# Patient Record
Sex: Male | Born: 1940
Health system: Southern US, Community
[De-identification: ages and names within clinical notes are randomized; demographics above are authoritative.]

## PROBLEM LIST (undated history)

## (undated) DIAGNOSIS — R51 Headache: Secondary | ICD-10-CM

## (undated) DIAGNOSIS — R350 Frequency of micturition: Secondary | ICD-10-CM

## (undated) DIAGNOSIS — I6529 Occlusion and stenosis of unspecified carotid artery: Secondary | ICD-10-CM

## (undated) DIAGNOSIS — R112 Nausea with vomiting, unspecified: Secondary | ICD-10-CM

## (undated) DIAGNOSIS — Z9289 Personal history of other medical treatment: Secondary | ICD-10-CM

## (undated) DIAGNOSIS — I739 Peripheral vascular disease, unspecified: Secondary | ICD-10-CM

## (undated) DIAGNOSIS — T8859XA Other complications of anesthesia, initial encounter: Secondary | ICD-10-CM

## (undated) DIAGNOSIS — R519 Headache, unspecified: Secondary | ICD-10-CM

## (undated) DIAGNOSIS — N401 Enlarged prostate with lower urinary tract symptoms: Secondary | ICD-10-CM

## (undated) DIAGNOSIS — T4145XA Adverse effect of unspecified anesthetic, initial encounter: Secondary | ICD-10-CM

## (undated) DIAGNOSIS — I499 Cardiac arrhythmia, unspecified: Secondary | ICD-10-CM

## (undated) DIAGNOSIS — R339 Retention of urine, unspecified: Secondary | ICD-10-CM

## (undated) DIAGNOSIS — H9190 Unspecified hearing loss, unspecified ear: Secondary | ICD-10-CM

## (undated) DIAGNOSIS — I10 Essential (primary) hypertension: Secondary | ICD-10-CM

## (undated) DIAGNOSIS — E785 Hyperlipidemia, unspecified: Secondary | ICD-10-CM

## (undated) DIAGNOSIS — Z9889 Other specified postprocedural states: Secondary | ICD-10-CM

## (undated) HISTORY — DX: Hyperlipidemia, unspecified: E78.5

## (undated) HISTORY — PX: OTHER SURGICAL HISTORY: SHX169

## (undated) HISTORY — PX: VASECTOMY: SHX75

## (undated) HISTORY — DX: Peripheral vascular disease, unspecified: I73.9

## (undated) HISTORY — PX: TONSILLECTOMY: SUR1361

## (undated) HISTORY — DX: Occlusion and stenosis of unspecified carotid artery: I65.29

---

## 2010-01-21 ENCOUNTER — Encounter: Admission: RE | Admit: 2010-01-21 | Discharge: 2010-01-21 | Payer: Self-pay | Admitting: Specialist

## 2014-11-13 DIAGNOSIS — H524 Presbyopia: Secondary | ICD-10-CM | POA: Diagnosis not present

## 2014-12-25 DIAGNOSIS — H43813 Vitreous degeneration, bilateral: Secondary | ICD-10-CM | POA: Diagnosis not present

## 2014-12-25 DIAGNOSIS — H34831 Tributary (branch) retinal vein occlusion, right eye: Secondary | ICD-10-CM | POA: Diagnosis not present

## 2015-04-14 ENCOUNTER — Inpatient Hospital Stay (HOSPITAL_BASED_OUTPATIENT_CLINIC_OR_DEPARTMENT_OTHER)
Admission: EM | Admit: 2015-04-14 | Discharge: 2015-04-23 | DRG: 871 | Disposition: A | Discharge status: Home Health | Payer: Medicare Other | Attending: Internal Medicine | Admitting: Internal Medicine

## 2015-04-14 ENCOUNTER — Encounter (HOSPITAL_BASED_OUTPATIENT_CLINIC_OR_DEPARTMENT_OTHER): Payer: Self-pay | Admitting: *Deleted

## 2015-04-14 ENCOUNTER — Emergency Department (HOSPITAL_BASED_OUTPATIENT_CLINIC_OR_DEPARTMENT_OTHER): Payer: Medicare Other

## 2015-04-14 DIAGNOSIS — I129 Hypertensive chronic kidney disease with stage 1 through stage 4 chronic kidney disease, or unspecified chronic kidney disease: Secondary | ICD-10-CM | POA: Diagnosis not present

## 2015-04-14 DIAGNOSIS — E119 Type 2 diabetes mellitus without complications: Secondary | ICD-10-CM | POA: Diagnosis not present

## 2015-04-14 DIAGNOSIS — I480 Paroxysmal atrial fibrillation: Secondary | ICD-10-CM

## 2015-04-14 DIAGNOSIS — E780 Pure hypercholesterolemia: Secondary | ICD-10-CM | POA: Diagnosis present

## 2015-04-14 DIAGNOSIS — I25110 Atherosclerotic heart disease of native coronary artery with unstable angina pectoris: Secondary | ICD-10-CM | POA: Diagnosis not present

## 2015-04-14 DIAGNOSIS — R319 Hematuria, unspecified: Secondary | ICD-10-CM | POA: Diagnosis not present

## 2015-04-14 DIAGNOSIS — R778 Other specified abnormalities of plasma proteins: Secondary | ICD-10-CM | POA: Diagnosis present

## 2015-04-14 DIAGNOSIS — R7881 Bacteremia: Secondary | ICD-10-CM | POA: Diagnosis not present

## 2015-04-14 DIAGNOSIS — N184 Chronic kidney disease, stage 4 (severe): Secondary | ICD-10-CM

## 2015-04-14 DIAGNOSIS — J189 Pneumonia, unspecified organism: Secondary | ICD-10-CM | POA: Diagnosis not present

## 2015-04-14 DIAGNOSIS — D638 Anemia in other chronic diseases classified elsewhere: Secondary | ICD-10-CM | POA: Diagnosis present

## 2015-04-14 DIAGNOSIS — R110 Nausea: Secondary | ICD-10-CM

## 2015-04-14 DIAGNOSIS — N401 Enlarged prostate with lower urinary tract symptoms: Secondary | ICD-10-CM | POA: Diagnosis not present

## 2015-04-14 DIAGNOSIS — D509 Iron deficiency anemia, unspecified: Secondary | ICD-10-CM | POA: Diagnosis present

## 2015-04-14 DIAGNOSIS — K449 Diaphragmatic hernia without obstruction or gangrene: Secondary | ICD-10-CM | POA: Diagnosis not present

## 2015-04-14 DIAGNOSIS — R310 Gross hematuria: Secondary | ICD-10-CM | POA: Diagnosis not present

## 2015-04-14 DIAGNOSIS — A4151 Sepsis due to Escherichia coli [E. coli]: Principal | ICD-10-CM | POA: Diagnosis present

## 2015-04-14 DIAGNOSIS — Z7982 Long term (current) use of aspirin: Secondary | ICD-10-CM | POA: Diagnosis not present

## 2015-04-14 DIAGNOSIS — R531 Weakness: Secondary | ICD-10-CM

## 2015-04-14 DIAGNOSIS — R338 Other retention of urine: Secondary | ICD-10-CM

## 2015-04-14 DIAGNOSIS — I4891 Unspecified atrial fibrillation: Secondary | ICD-10-CM | POA: Diagnosis not present

## 2015-04-14 DIAGNOSIS — I4892 Unspecified atrial flutter: Secondary | ICD-10-CM | POA: Diagnosis not present

## 2015-04-14 DIAGNOSIS — N138 Other obstructive and reflux uropathy: Secondary | ICD-10-CM | POA: Diagnosis present

## 2015-04-14 DIAGNOSIS — I482 Chronic atrial fibrillation: Secondary | ICD-10-CM | POA: Diagnosis not present

## 2015-04-14 DIAGNOSIS — R7989 Other specified abnormal findings of blood chemistry: Secondary | ICD-10-CM | POA: Diagnosis not present

## 2015-04-14 DIAGNOSIS — I248 Other forms of acute ischemic heart disease: Secondary | ICD-10-CM | POA: Diagnosis present

## 2015-04-14 DIAGNOSIS — N186 End stage renal disease: Secondary | ICD-10-CM | POA: Diagnosis not present

## 2015-04-14 DIAGNOSIS — Z66 Do not resuscitate: Secondary | ICD-10-CM | POA: Diagnosis not present

## 2015-04-14 DIAGNOSIS — N390 Urinary tract infection, site not specified: Secondary | ICD-10-CM | POA: Diagnosis not present

## 2015-04-14 DIAGNOSIS — R197 Diarrhea, unspecified: Secondary | ICD-10-CM | POA: Diagnosis not present

## 2015-04-14 DIAGNOSIS — I10 Essential (primary) hypertension: Secondary | ICD-10-CM | POA: Diagnosis present

## 2015-04-14 DIAGNOSIS — Z791 Long term (current) use of non-steroidal anti-inflammatories (NSAID): Secondary | ICD-10-CM

## 2015-04-14 DIAGNOSIS — N320 Bladder-neck obstruction: Secondary | ICD-10-CM | POA: Diagnosis not present

## 2015-04-14 DIAGNOSIS — N1330 Unspecified hydronephrosis: Secondary | ICD-10-CM | POA: Diagnosis not present

## 2015-04-14 DIAGNOSIS — E860 Dehydration: Secondary | ICD-10-CM | POA: Diagnosis present

## 2015-04-14 DIAGNOSIS — N179 Acute kidney failure, unspecified: Secondary | ICD-10-CM | POA: Diagnosis not present

## 2015-04-14 DIAGNOSIS — A419 Sepsis, unspecified organism: Secondary | ICD-10-CM | POA: Diagnosis present

## 2015-04-14 DIAGNOSIS — I4821 Permanent atrial fibrillation: Secondary | ICD-10-CM

## 2015-04-14 DIAGNOSIS — R9431 Abnormal electrocardiogram [ECG] [EKG]: Secondary | ICD-10-CM | POA: Diagnosis not present

## 2015-04-14 DIAGNOSIS — R339 Retention of urine, unspecified: Secondary | ICD-10-CM | POA: Diagnosis not present

## 2015-04-14 DIAGNOSIS — D500 Iron deficiency anemia secondary to blood loss (chronic): Secondary | ICD-10-CM | POA: Diagnosis not present

## 2015-04-14 DIAGNOSIS — R918 Other nonspecific abnormal finding of lung field: Secondary | ICD-10-CM | POA: Diagnosis not present

## 2015-04-14 HISTORY — DX: Paroxysmal atrial fibrillation: I48.0

## 2015-04-14 HISTORY — DX: Pneumonia, unspecified organism: J18.9

## 2015-04-14 HISTORY — DX: Other specified abnormalities of plasma proteins: R77.8

## 2015-04-14 HISTORY — DX: Permanent atrial fibrillation: I48.21

## 2015-04-14 HISTORY — DX: Iron deficiency anemia, unspecified: D50.9

## 2015-04-14 HISTORY — DX: Essential (primary) hypertension: I10

## 2015-04-14 HISTORY — DX: Chronic kidney disease, stage 4 (severe): N18.4

## 2015-04-14 LAB — CBC
HEMATOCRIT: 31.3 % — AB (ref 39.0–52.0)
Hemoglobin: 10.6 g/dL — ABNORMAL LOW (ref 13.0–17.0)
MCH: 32.3 pg (ref 26.0–34.0)
MCHC: 33.9 g/dL (ref 30.0–36.0)
MCV: 95.4 fL (ref 78.0–100.0)
Platelets: 177 10*3/uL (ref 150–400)
RBC: 3.28 MIL/uL — ABNORMAL LOW (ref 4.22–5.81)
RDW: 12.7 % (ref 11.5–15.5)
WBC: 12.8 10*3/uL — ABNORMAL HIGH (ref 4.0–10.5)

## 2015-04-14 LAB — MRSA PCR SCREENING: MRSA BY PCR: NEGATIVE

## 2015-04-14 LAB — COMPREHENSIVE METABOLIC PANEL
ALT: 11 U/L — ABNORMAL LOW (ref 17–63)
AST: 22 U/L (ref 15–41)
Albumin: 3.9 g/dL (ref 3.5–5.0)
Alkaline Phosphatase: 59 U/L (ref 38–126)
Anion gap: 15 (ref 5–15)
BUN: 73 mg/dL — ABNORMAL HIGH (ref 6–20)
CO2: 21 mmol/L — ABNORMAL LOW (ref 22–32)
Calcium: 9.5 mg/dL (ref 8.9–10.3)
Chloride: 100 mmol/L — ABNORMAL LOW (ref 101–111)
Creatinine, Ser: 5.33 mg/dL — ABNORMAL HIGH (ref 0.61–1.24)
GFR calc Af Amer: 11 mL/min — ABNORMAL LOW (ref >60)
GFR calc non Af Amer: 10 mL/min — ABNORMAL LOW (ref >60)
GLUCOSE: 142 mg/dL — AB (ref 65–99)
Potassium: 4.7 mmol/L (ref 3.5–5.1)
Sodium: 136 mmol/L (ref 135–145)
Total Bilirubin: 0.7 mg/dL (ref 0.3–1.2)
Total Protein: 8 g/dL (ref 6.5–8.1)

## 2015-04-14 LAB — TROPONIN I
TROPONIN I: 0.16 ng/mL — AB (ref <0.031)
Troponin I: 0.19 ng/mL — ABNORMAL HIGH (ref <0.031)

## 2015-04-14 LAB — I-STAT ARTERIAL BLOOD GAS, ED
Acid-base deficit: 4 mmol/L — ABNORMAL HIGH (ref 0.0–2.0)
BICARBONATE: 18.2 meq/L — AB (ref 20.0–24.0)
O2 SAT: 92 %
TCO2: 19 mmol/L (ref 0–100)
pCO2 arterial: 24 mmHg — ABNORMAL LOW (ref 35.0–45.0)
pH, Arterial: 7.489 — ABNORMAL HIGH (ref 7.350–7.450)
pO2, Arterial: 58 mmHg — ABNORMAL LOW (ref 80.0–100.0)

## 2015-04-14 LAB — RETICULOCYTES
RBC.: 2.1 MIL/uL — AB (ref 4.22–5.81)
Retic Count, Absolute: 21 10*3/uL (ref 19.0–186.0)
Retic Ct Pct: 1 % (ref 0.4–3.1)

## 2015-04-14 LAB — PROTIME-INR
INR: 1.58 — ABNORMAL HIGH (ref 0.00–1.49)
PROTHROMBIN TIME: 18.9 s — AB (ref 11.6–15.2)

## 2015-04-14 LAB — PROCALCITONIN: PROCALCITONIN: 16.49 ng/mL

## 2015-04-14 LAB — OCCULT BLOOD X 1 CARD TO LAB, STOOL: Fecal Occult Bld: NEGATIVE

## 2015-04-14 LAB — LACTIC ACID, PLASMA: LACTIC ACID, VENOUS: 1.2 mmol/L (ref 0.5–2.0)

## 2015-04-14 LAB — PHOSPHORUS: Phosphorus: 4.2 mg/dL (ref 2.5–4.6)

## 2015-04-14 LAB — APTT: aPTT: 59 seconds — ABNORMAL HIGH (ref 24–37)

## 2015-04-14 MED ORDER — ONDANSETRON HCL 4 MG/2ML IJ SOLN
4.0000 mg | Freq: Once | INTRAMUSCULAR | Status: AC
  Administered 2015-04-14: 4 mg via INTRAVENOUS

## 2015-04-14 MED ORDER — PNEUMOCOCCAL VAC POLYVALENT 25 MCG/0.5ML IJ INJ
0.5000 mL | INJECTION | INTRAMUSCULAR | Status: AC
  Administered 2015-04-15: 0.5 mL via INTRAMUSCULAR
  Filled 2015-04-14: qty 0.5

## 2015-04-14 MED ORDER — SODIUM CHLORIDE 0.9 % IJ SOLN
3.0000 mL | Freq: Two times a day (BID) | INTRAMUSCULAR | Status: DC
  Administered 2015-04-14 – 2015-04-23 (×15): 3 mL via INTRAVENOUS

## 2015-04-14 MED ORDER — AZITHROMYCIN 500 MG IV SOLR: 500.0000 mg | Freq: Once | INTRAVENOUS | Status: DC

## 2015-04-14 MED ORDER — DEXTROSE 5 % IV SOLN
1.0000 g | INTRAVENOUS | Status: DC
  Administered 2015-04-14: 1 g via INTRAVENOUS
  Filled 2015-04-14: qty 10

## 2015-04-14 MED ORDER — DEXTROSE 5 % IV SOLN
500.0000 mg | Freq: Once | INTRAVENOUS | Status: DC
  Filled 2015-04-14: qty 500

## 2015-04-14 MED ORDER — ASPIRIN 81 MG PO CHEW
81.0000 mg | CHEWABLE_TABLET | Freq: Every day | ORAL | Status: DC
  Administered 2015-04-14 – 2015-04-21 (×8): 81 mg via ORAL
  Filled 2015-04-14 (×8): qty 1

## 2015-04-14 MED ORDER — ACETAMINOPHEN 500 MG PO TABS
ORAL_TABLET | ORAL | Status: AC
  Administered 2015-04-14: 1000 mg via ORAL
  Filled 2015-04-14: qty 2

## 2015-04-14 MED ORDER — ONDANSETRON HCL 4 MG/2ML IJ SOLN: 4.0000 mg | Freq: Four times a day (QID) | INTRAMUSCULAR | Status: DC | PRN

## 2015-04-14 MED ORDER — DEXTROSE 5 % IV SOLN
500.0000 mg | Freq: Once | INTRAVENOUS | Status: AC
  Administered 2015-04-14: 500 mg via INTRAVENOUS

## 2015-04-14 MED ORDER — CEFTRIAXONE SODIUM 1 G IJ SOLR
INTRAMUSCULAR | Status: AC
  Filled 2015-04-14: qty 10

## 2015-04-14 MED ORDER — METOPROLOL TARTRATE 1 MG/ML IV SOLN
2.5000 mg | Freq: Once | INTRAVENOUS | Status: AC
  Administered 2015-04-14: 2.5 mg via INTRAVENOUS
  Filled 2015-04-14: qty 5

## 2015-04-14 MED ORDER — ONDANSETRON HCL 4 MG/2ML IJ SOLN
INTRAMUSCULAR | Status: AC
  Administered 2015-04-14: 4 mg via INTRAVENOUS
  Filled 2015-04-14: qty 2

## 2015-04-14 MED ORDER — HEPARIN (PORCINE) IN NACL 100-0.45 UNIT/ML-% IJ SOLN
1900.0000 [IU]/h | INTRAMUSCULAR | Status: DC
  Administered 2015-04-14: 1100 [IU]/h via INTRAVENOUS
  Administered 2015-04-15: 1350 [IU]/h via INTRAVENOUS
  Administered 2015-04-16: 1500 [IU]/h via INTRAVENOUS
  Administered 2015-04-16: 1800 [IU]/h via INTRAVENOUS
  Administered 2015-04-17: 2000 [IU]/h via INTRAVENOUS
  Administered 2015-04-17: 2100 [IU]/h via INTRAVENOUS
  Administered 2015-04-19: 1900 [IU]/h via INTRAVENOUS
  Administered 2015-04-19: 2100 [IU]/h via INTRAVENOUS
  Filled 2015-04-14 (×13): qty 250

## 2015-04-14 MED ORDER — SODIUM CHLORIDE 0.9 % IV BOLUS (SEPSIS)
1000.0000 mL | Freq: Once | INTRAVENOUS | Status: AC
  Administered 2015-04-14: 1000 mL via INTRAVENOUS

## 2015-04-14 MED ORDER — ONDANSETRON HCL 4 MG PO TABS: 4.0000 mg | ORAL_TABLET | Freq: Four times a day (QID) | ORAL | Status: DC | PRN

## 2015-04-14 MED ORDER — METOPROLOL TARTRATE 1 MG/ML IV SOLN: 5.0000 mg | Freq: Once | INTRAVENOUS | Status: DC

## 2015-04-14 MED ORDER — CEFTRIAXONE SODIUM IN DEXTROSE 20 MG/ML IV SOLN: 1.0000 g | Freq: Once | INTRAVENOUS | Status: DC

## 2015-04-14 MED ORDER — ACETAMINOPHEN 650 MG RE SUPP: 650.0000 mg | Freq: Four times a day (QID) | RECTAL | Status: DC | PRN

## 2015-04-14 MED ORDER — AZITHROMYCIN 500 MG IV SOLR
INTRAVENOUS | Status: AC
  Filled 2015-04-14: qty 500

## 2015-04-14 MED ORDER — DILTIAZEM HCL 100 MG IV SOLR
5.0000 mg/h | INTRAVENOUS | Status: DC
  Administered 2015-04-14: 10 mg/h via INTRAVENOUS
  Administered 2015-04-14 – 2015-04-15 (×2): 5 mg/h via INTRAVENOUS
  Filled 2015-04-14: qty 100

## 2015-04-14 MED ORDER — ACETAMINOPHEN 325 MG PO TABS
650.0000 mg | ORAL_TABLET | Freq: Four times a day (QID) | ORAL | Status: DC | PRN
  Administered 2015-04-14 – 2015-04-15 (×3): 650 mg via ORAL
  Filled 2015-04-14 (×3): qty 2

## 2015-04-14 MED ORDER — DILTIAZEM LOAD VIA INFUSION
20.0000 mg | Freq: Once | INTRAVENOUS | Status: AC
  Administered 2015-04-14: 20 mg via INTRAVENOUS
  Filled 2015-04-14: qty 20

## 2015-04-14 MED ORDER — HEPARIN BOLUS VIA INFUSION
4000.0000 [IU] | Freq: Once | INTRAVENOUS | Status: AC
  Administered 2015-04-14: 4000 [IU] via INTRAVENOUS

## 2015-04-14 MED ORDER — AMIODARONE HCL 200 MG PO TABS
200.0000 mg | ORAL_TABLET | Freq: Two times a day (BID) | ORAL | Status: DC
  Administered 2015-04-14 – 2015-04-23 (×18): 200 mg via ORAL
  Filled 2015-04-14 (×20): qty 1

## 2015-04-14 MED ORDER — ACETAMINOPHEN 500 MG PO TABS
1000.0000 mg | ORAL_TABLET | Freq: Once | ORAL | Status: AC
  Administered 2015-04-14: 1000 mg via ORAL

## 2015-04-14 MED ORDER — METOPROLOL TARTRATE 25 MG PO TABS
25.0000 mg | ORAL_TABLET | Freq: Two times a day (BID) | ORAL | Status: DC
  Administered 2015-04-14 – 2015-04-16 (×4): 25 mg via ORAL
  Filled 2015-04-14 (×5): qty 1

## 2015-04-14 NOTE — ED Notes (Signed)
Bipap discontinued per MD order.  ABG in 30 minutes on 4lpm [place]

## 2015-04-14 NOTE — ED Notes (Signed)
Pt dry heaving, states he doesn't feel good- Dr Doy Mince at bedside to assess- zofran given IV and cardizem gtt increased to 15

## 2015-04-14 NOTE — Consult Note (Signed)
ANTICOAGULATION CONSULT NOTE - Initial Consult  Pharmacy Consult for Heparin Indication: atrial fibrillation  No Known Allergies  Patient Measurements: Height: 5\' 4"  (162.6 cm) Weight: 196 lb (88.905 kg) IBW/kg (Calculated) : 59.2 Heparin Dosing Weight: 78.5kg  Vital Signs: Temp: 98.2 F (36.8 C) (07/25 1454) Temp Source: Oral (07/25 1454) BP: 158/81 mmHg (07/25 1656) Pulse Rate: 129 (07/25 1656)  Labs:  Recent Labs  04/14/15 1511  HGB 10.6*  HCT 31.3*  PLT 177  CREATININE 5.33*  TROPONINI 0.16*    Estimated Creatinine Clearance: 12.8 mL/min (by C-G formula based on Cr of 5.33).   Medical History: Past Medical History  Diagnosis Date  . Hypertension     Medications:  No anticoagulants pta  Assessment: 73yom presented to [facility] ED with weakness that started a week ago but has gradually been worsening. He was found to be in new onset afib RVR (CHADSVASC 2). He will begin IV heparin. Noted to be in acute renal failure with sCr 5.33 and CrCl ~ 59ml/min.   Goal of Therapy:  Heparin level 0.3-0.7 units/ml Monitor platelets by anticoagulation protocol: Yes   Plan:  1) Heparin bolus 4000 units x 1 2) Heparin drip at 1100 units/hr 3) Check 8 hour heparin level 4) Daily heparin level and CBC   Deboraha Sprang 04/14/2015,5:01 PM

## 2015-04-14 NOTE — ED Notes (Signed)
Pt and spouse c/o pt having a a weak stomach with N/V/D and loss of appetite for "some time" but it ahs become worse over the last week. Pt was unable to get an appt with his PCP.

## 2015-04-14 NOTE — Consult Note (Signed)
Reason for Consult: Atrial fibrillation with rapid ventricular response/minimally elevated troponin I Referring Physician: [facility] hospitalist  [name] is an 74 y.o. male.  HPI: Patient is 74 year old male with past medical history significant for hypertension, hypercholesteremia, not on any medications not seen PMD for several years was transferred from [facility] for further management. Patient complained of feeling tired, fatigued and no energy  for last few weeks. Patient denies any chest pains palpitations lightheadedness or syncope denies PND orthopnea or leg swelling. Patient was noted to be in A. fib with RVR with marked ST T-wave changes in inferolateral leads and was noted to have minimally elevated troponin I. Patient received IV Cardizem bolus and was started on drip without much improvement in his heart rate and subsequently received IV Lopressor with spontaneous conversion to sinus rhythm. Repeat EKG done showed her ectopic atrial rhythm with minor ST-T wave changes in lateral leads. Patient also was noted to have elevated white count with left lower lobe opacity for which he is being treated for community-acquired pneumonia. As per his wife patient has not been drinking any fluids and appetite has been poor for last several days also noted to be in acute renal failure. Patient denies any cardiac workup in the past. Past Medical History  Diagnosis Date  . Hypertension     Past Surgical History  Procedure Laterality Date  . Tonsillectomy      Family History  Problem Relation Age of Onset  . Breast cancer Mother     Social History:  reports that he has never smoked. He does not have any smokeless tobacco history on file. He reports that he does not drink alcohol or use illicit drugs.  Allergies: No Known Allergies  Medications: I have reviewed the patient's current medications.  Results for orders placed or performed during the hospital encounter of 04/14/15  (from the past 48 hour(s))  CBC     Status: Abnormal   Collection Time: 04/14/15  3:11 PM  Result Value Ref Range   WBC 12.8 (H) 4.0 - 10.5 K/uL   RBC 3.28 (L) 4.22 - 5.81 MIL/uL   Hemoglobin 10.6 (L) 13.0 - 17.0 g/dL   HCT 31.3 (L) 39.0 - 52.0 %   MCV 95.4 78.0 - 100.0 fL   MCH 32.3 26.0 - 34.0 pg   MCHC 33.9 30.0 - 36.0 g/dL   RDW 12.7 11.5 - 15.5 %   Platelets 177 150 - 400 K/uL  Comprehensive metabolic panel     Status: Abnormal   Collection Time: 04/14/15  3:11 PM  Result Value Ref Range   Sodium 136 135 - 145 mmol/L   Potassium 4.7 3.5 - 5.1 mmol/L   Chloride 100 (L) 101 - 111 mmol/L   CO2 21 (L) 22 - 32 mmol/L   Glucose, Bld 142 (H) 65 - 99 mg/dL   BUN 73 (H) 6 - 20 mg/dL   Creatinine, Ser 5.33 (H) 0.61 - 1.24 mg/dL   Calcium 9.5 8.9 - 10.3 mg/dL   Total Protein 8.0 6.5 - 8.1 g/dL   Albumin 3.9 3.5 - 5.0 g/dL   AST 22 15 - 41 U/L   ALT 11 (L) 17 - 63 U/L   Alkaline Phosphatase 59 38 - 126 U/L   Total Bilirubin 0.7 0.3 - 1.2 mg/dL   GFR calc non Af Amer 10 (L) >60 mL/min   GFR calc Af Amer 11 (L) >60 mL/min    Comment: (NOTE) The eGFR has  been calculated using the CKD EPI equation. This calculation has not been validated in all clinical situations. eGFR's persistently <60 mL/min signify possible Chronic Kidney Disease.    Anion gap 15 5 - 15  Troponin I     Status: Abnormal   Collection Time: 04/14/15  3:11 PM  Result Value Ref Range   Troponin I 0.16 (H) <0.031 ng/mL    Comment:        PERSISTENTLY INCREASED TROPONIN VALUES IN THE RANGE OF 0.04-0.49 ng/mL CAN BE SEEN IN:       -UNSTABLE ANGINA       -CONGESTIVE HEART FAILURE       -MYOCARDITIS       -CHEST TRAUMA       -ARRYHTHMIAS       -LATE PRESENTING MYOCARDIAL INFARCTION       -COPD   CLINICAL FOLLOW-UP RECOMMENDED.   Occult blood card to lab, stool RN will collect     Status: None   Collection Time: 04/14/15  3:25 PM  Result Value Ref Range   Fecal Occult Bld NEGATIVE NEGATIVE  I-Stat arterial  blood gas, ED     Status: Abnormal   Collection Time: 04/14/15  6:43 PM  Result Value Ref Range   pH, Arterial 7.489 (H) 7.350 - 7.450   pCO2 arterial 24.0 (L) 35.0 - 45.0 mmHg   pO2, Arterial 58.0 (L) 80.0 - 100.0 mmHg   Bicarbonate 18.2 (L) 20.0 - 24.0 mEq/L   TCO2 19 0 - 100 mmol/L   O2 Saturation 92.0 %   Acid-base deficit 4.0 (H) 0.0 - 2.0 mmol/L   Patient temperature 99.7 F    Collection site RADIAL, ALLEN'S TEST ACCEPTABLE    Drawn by RT    Sample type ARTERIAL     Dg Chest Portable 1 View  04/14/2015   CLINICAL DATA:  74 year old male with nausea, vomiting and diarrhea gradually worsening over the past week. Atrial fibrillation.  EXAM: PORTABLE CHEST - 1 VIEW  COMPARISON:  No priors.  FINDINGS: Lung volumes are low. Ill-defined opacity at the left lung base obscuring the left hemidiaphragm. No definite pleural effusions. Heart size is upper limits of normal. The patient is rotated to the left on today's exam, resulting in distortion of the mediastinal contours and reduced diagnostic sensitivity and specificity for mediastinal pathology.  IMPRESSION: 1. Left lower lobe opacity concerning for pneumonia. Followup PA and lateral chest X-ray is recommended in 3-4 weeks following trial of antibiotic therapy to ensure resolution and exclude underlying malignancy.   Electronically Signed   By: Vinnie Langton M.D.   On: 04/14/2015 16:41    Review of Systems  Constitutional: Positive for malaise/fatigue. Negative for fever and chills.  Eyes: Negative for double vision and photophobia.  Cardiovascular: Negative for chest pain, palpitations and orthopnea.  Gastrointestinal: Positive for abdominal pain. Negative for nausea and vomiting.  Genitourinary: Negative for dysuria and urgency.  Neurological: Positive for weakness. Negative for dizziness and headaches.   Blood pressure 121/66, pulse 80, temperature 102 F (38.9 C), temperature source Rectal, resp. rate 27, height $RemoveBe'5\' 4"'qSbWNdwOr$  (1.626 m),  weight 90 kg (198 lb 6.6 oz), SpO2 99 %. Physical Exam  Constitutional: He is oriented to person, place, and time.  HENT:  Head: Normocephalic and atraumatic.  Eyes: Conjunctivae are normal. Pupils are equal, round, and reactive to light. Left eye exhibits no discharge. No scleral icterus.  Neck: Normal range of motion. Neck supple. No JVD present. No tracheal deviation present. No  thyromegaly present.  Cardiovascular: Normal rate and regular rhythm.   Murmur (Soft systolic murmur noted no S3 gallop) heard. Respiratory:  Decreased breath sound at bases with occasional left rhonchi  GI: Soft. Bowel sounds are normal. He exhibits distension. There is no tenderness. There is no rebound.  Musculoskeletal: He exhibits no edema or tenderness.  Neurological: He is alert and oriented to person, place, and time.    Assessment/Plan: Status post acute A. fib with RVR Probable very small type II MI secondary to demand ischemia/abnormal EKG rule out coronary insufficiency Hypertension Hypercholesteremia Community acquired left pneumonia Probable acute on chronic kidney injury  Dehydration Anemia of chronic disease Status post rectal bleeding Plan Check serial enzymes and EKG, lipid panel, TSH Check 2-D echo Start Lopressor and amiodarone as per orders as patient already converted in sinus rhythm and hopefully will stay in sinus rhythm with amiodarone. Continue heparin Add low-dose aspirin Wean off Cardizem drip Slow hydration Further treatment as per primary team  [name] 04/14/2015, 9:30 PM

## 2015-04-14 NOTE — ED Notes (Signed)
Bi-pap placed by Crystal, RT per VORB from Dr Doy Mince at bedside

## 2015-04-14 NOTE — ED Provider Notes (Signed)
CSN: [id number]     Arrival date & time 04/14/15  1451 History  This chart was scribed for Serita Grit, MD by Rayna Sexton, ED scribe. This patient was seen in room MH09/MH09 and the patient's care was started at 3:18 PM.  Chief Complaint  Patient presents with  . Abdominal Pain   Patient is a 74 y.o. male presenting with weakness. The history is provided by the patient and the spouse. No language interpreter was used.  Weakness This is a chronic problem. The current episode started more than 1 week ago. The problem occurs constantly. The problem has been gradually worsening. Associated symptoms include headaches. Pertinent negatives include no chest pain and no shortness of breath.    HPI Comments: Andoni Calma. is a 74 y.o. male, with a hx of HTN, who presents to the Emergency Department complaining of constant, moderate, weakness with onset a few months ago and a worsening of his symptoms 1 week ago. His wife notes associated nausea, intermittent unproductive vomiting, 1x diarrhea this morning, 1 prior BM with traces of "red" blood and multiple occurences of falls throughout the past week. Pt's wife notes that he hasn't seen a physician in more than 4 years and further notes that he takes [employer] and Asprin 3-4 per day due to re-occuring HA's but denies that he has taken an increased amount of these medications recently. He denies tarry stools, CP, SOB, dizziness and lightheadedness.  Past Medical History  Diagnosis Date  . Hypertension    Past Surgical History  Procedure Laterality Date  . Tonsillectomy     No family history on file. History  Substance Use Topics  . Smoking status: Never Smoker   . Smokeless tobacco: Not on file  . Alcohol Use: No    Review of Systems  Respiratory: Negative for shortness of breath.   Cardiovascular: Negative for chest pain.  Gastrointestinal: Positive for nausea, vomiting, diarrhea and blood in stool.  Neurological: Positive for  weakness and headaches. Negative for dizziness and light-headedness.  All other systems reviewed and are negative.   Allergies  Review of patient's allergies indicates no known allergies.  Home Medications   Prior to Admission medications   Not on File   BP 161/87 mmHg  Pulse 65  Temp(Src) 98.2 F (36.8 C) (Oral)  Resp 18  Ht 5\' 4"  (1.626 m)  Wt 196 lb (88.905 kg)  BMI 33.63 kg/m2  SpO2 99% Physical Exam  Constitutional: He is oriented to person, place, and time. He appears well-developed and well-nourished. No distress.  HENT:  Head: Normocephalic and atraumatic.  Mouth/Throat: Oropharynx is clear and moist.  Eyes: Conjunctivae are normal. Pupils are equal, round, and reactive to light. No scleral icterus.  Neck: Neck supple.  Cardiovascular: Normal heart sounds and intact distal pulses.  An irregularly irregular rhythm present. Tachycardia present.   No murmur heard. Pulmonary/Chest: Effort normal and breath sounds normal. No stridor. Tachypnea noted. No respiratory distress. He has no wheezes. He has no rales.  Abdominal: Soft. He exhibits no distension. There is no tenderness.  Genitourinary: Rectal exam shows external hemorrhoid. Rectal exam shows no fissure, no mass, no tenderness and anal tone normal.  Brown stool. No gross blood. Large hemorrhoid.  Musculoskeletal: Normal range of motion. He exhibits no edema.  Neurological: He is alert and oriented to person, place, and time.  Skin: Skin is warm and dry. No rash noted. There is pallor.  Psychiatric: He has a normal mood and affect.  His behavior is normal.  Nursing note and vitals reviewed.   ED Course  CRITICAL CARE Performed by: Serita Grit Authorized by: Serita Grit Total critical care time: 60 minutes Critical care time was exclusive of separately billable procedures and treating other patients. Critical care was necessary to treat or prevent imminent or life-threatening deterioration of the following  conditions: cardiac failure and sepsis. Critical care was time spent personally by me on the following activities: development of treatment plan with patient or surrogate, discussions with consultants, evaluation of patient's response to treatment, examination of patient, obtaining history from patient or surrogate, ordering and performing treatments and interventions, ordering and review of laboratory studies, ordering and review of radiographic studies, pulse oximetry, re-evaluation of patient's condition and review of old charts.    DIAGNOSTIC STUDIES: Oxygen Saturation is 99% on RA, normal by my interpretation.    COORDINATION OF CARE: 3:29 PM Discussed treatment plan with pt at bedside and pt agreed to plan.  Labs Review Labs Reviewed  CBC - Abnormal; Notable for the following:    WBC 12.8 (*)    RBC 3.28 (*)    Hemoglobin 10.6 (*)    HCT 31.3 (*)    All other components within normal limits  COMPREHENSIVE METABOLIC PANEL - Abnormal; Notable for the following:    Chloride 100 (*)    CO2 21 (*)    Glucose, Bld 142 (*)    BUN 73 (*)    Creatinine, Ser 5.33 (*)    ALT 11 (*)    GFR calc non Af Amer 10 (*)    GFR calc Af Amer 11 (*)    All other components within normal limits  TROPONIN I - Abnormal; Notable for the following:    Troponin I 0.16 (*)    All other components within normal limits  I-STAT ARTERIAL BLOOD GAS, ED - Abnormal; Notable for the following:    pH, Arterial 7.489 (*)    pCO2 arterial 24.0 (*)    pO2, Arterial 58.0 (*)    Bicarbonate 18.2 (*)    Acid-base deficit 4.0 (*)    All other components within normal limits  CULTURE, BLOOD (ROUTINE X 2)  CULTURE, BLOOD (ROUTINE X 2)  OCCULT BLOOD X 1 CARD TO LAB, STOOL  URINALYSIS, ROUTINE W REFLEX MICROSCOPIC (NOT AT [facility])  HEPARIN LEVEL (UNFRACTIONATED)  CBC  HEPARIN LEVEL (UNFRACTIONATED)    Imaging Review Dg Chest Portable 1 View  04/14/2015   CLINICAL DATA:  74 year old male with nausea, vomiting and  diarrhea gradually worsening over the past week. Atrial fibrillation.  EXAM: PORTABLE CHEST - 1 VIEW  COMPARISON:  No priors.  FINDINGS: Lung volumes are low. Ill-defined opacity at the left lung base obscuring the left hemidiaphragm. No definite pleural effusions. Heart size is upper limits of normal. The patient is rotated to the left on today's exam, resulting in distortion of the mediastinal contours and reduced diagnostic sensitivity and specificity for mediastinal pathology.  IMPRESSION: 1. Left lower lobe opacity concerning for pneumonia. Followup PA and lateral chest X-ray is recommended in 3-4 weeks following trial of antibiotic therapy to ensure resolution and exclude underlying malignancy.   Electronically Signed   By: Vinnie Langton M.D.   On: 04/14/2015 16:41     EKG Interpretation   Date/Time:  Monday April 14 2015 15:34:05 EDT Ventricular Rate:  159 PR Interval:    QRS Duration: 92 QT Interval:  296 QTC Calculation: 481 R Axis:   24 Text Interpretation:  Atrial fibrillation with rapid ventricular response  Incomplete right bundle branch block Marked ST abnormality, possible  inferior subendocardial injury Abnormal ECG No old tracing to compare  Confirmed by [facility]  MD, TREY ([id number]) on 04/14/2015 4:09:27 PM      MDM   Final diagnoses:  Weakness  CAP (community acquired pneumonia)  Atrial fibrillation with RVR    74 yo male with fatigue for a few weeks.  Nontoxic,but ill appearing.  Found to have new onset a fib with RVR.  Treat with Diltiazem bolus and drip.    7:22 PM Pt's initial presenting symptoms appeared to be attributable to A fib with RVR (several weeks of malaise with nausea and weakness.)  I spoke with Dr. Terrence Dupont with plans for rate control and heparin and cardiology admission.  However, during ED course, he became more tachypneic, requiring additional oxygen.  I initially felt that this could be secondary to developing CHF given his tachycardia.  Thusly, I  tried bipap for symptom control.  This did not seem to have a significant impact on his tachypnea.  Then, he developed fever and CXR was found to have signs of pneumonia.  He was covered with antibiotics for CAP.  I spoke with Dr. Candiss Norse regarding hospitalist admission.  We agreed to dc bipap and evaluate respiratory status on [place] O2.  He appeared to tolerate [place] oxygen off of bipap.  He was mentating well, had O2 sats of 100%, but was still tachypneic.  I did not feel that he required intubation.  However, I do feel that he needs to be monitored closely in the Stepdown unit in case he decompensates.  Dr. Terrence Dupont will also evaluate when he gets to [facility].     I personally performed the services described in this documentation, which was scribed in my presence. The recorded information has been reviewed and is accurate.     Serita Grit, MD 04/14/15 1929

## 2015-04-14 NOTE — ED Notes (Signed)
ABG drawn RR artery x 1 attempt. Bleeding stopped. No complications noted. Results shown to Dr. Doy Mince.

## 2015-04-14 NOTE — ED Notes (Signed)
Bi-pap stopped per VORB

## 2015-04-14 NOTE — H&P (Signed)
[facility] History and Physical  [name] [id number] DOB: 04/03/1941 DOA: 04/14/2015  Referring physician: Patient was transferred from [facility] [place]. Accepting physician Dr. Candiss Norse. PCP: No primary care provider on file.  Specialists: None.  Chief Complaint: Weakness and fatigue.  HPI: Frank W Huxton Wand. is a 74 y.o. male with history of hypertension who has not been to a physician for 2-3 years and presently on no medications except that patient takes Gabriel Earing powder for headaches was brought to the ER after patient was found increasingly weak and fatigued. As per family patient has been weak and fatigued over the last few weeks but worsened over the last 2 days. Patient also has been having poor appetite with nausea but denies any abdominal pain diarrhea. In the ER patient was found to be in A. fib with RVR with fever 102F. Chest x-ray shows pneumonic process. Creatinine more than 5. Patient's troponin was also was elevated. Patient was initially given fluid bolus. Patient's respiratory status got worse and it had to be initially placed on BiPAP but soon improved and was weaned off the BiPAP. Patient was placed on Cardizem infusion and Lopressor IV was given following which patient converted to sinus rhythm. On exam patient is not in distress denies any chest pain or shortness of breath. Has some vague abdominal discomfort but abdomen is nontender on exam. Patient is to give a sample of urine. But patient and patient's family states he has urinated today.   Review of Systems: As presented in the history of presenting illness, rest negative.  Past Medical History  Diagnosis Date  . Hypertension    Past Surgical History  Procedure Laterality Date  . Tonsillectomy     Social History:  reports that he has never smoked. He does not have any smokeless tobacco history on file. He reports that he does not drink alcohol or use illicit drugs. Where does patient live  home. Can patient participate in ADLs? Yes.  No Known Allergies  Family History:  Family History  Problem Relation Age of Onset  . Breast cancer Mother       Prior to Admission medications   Not on File    Physical Exam: Filed Vitals:   04/14/15 1912 04/14/15 1918 04/14/15 2030 04/14/15 2100  BP: 126/69  116/68 121/66  Pulse: 98  82 80  Temp: 100.3 F (37.9 C)  102 F (38.9 C)   TempSrc:   Rectal   Resp:  26 30 27   Height:   5\' 4"  (1.626 m)   Weight:   90 kg (198 lb 6.6 oz)   SpO2: 100%  99% 99%     General:  Moderately built and nourished.  Eyes: Anicteric no pallor.  ENT: No discharge from the ears eyes nose and mouth.  Neck: No JVD appreciated. No mass felt.  Cardiovascular: S1-S2 heard.  Respiratory: No rhonchi or crepitations.  Abdomen: Soft nontender bowel sounds present. No guarding or rigidity.  Skin: No rash.  Musculoskeletal: No edema.  Psychiatric: Appears normal.  Neurologic: Alert awake oriented to time place and person. Moves all extremities.  Labs on Admission:  Basic Metabolic Panel:  Recent Labs Lab 04/14/15 1511  NA 136  K 4.7  CL 100*  CO2 21*  GLUCOSE 142*  BUN 73*  CREATININE 5.33*  CALCIUM 9.5   Liver Function Tests:  Recent Labs Lab 04/14/15 1511  AST 22  ALT 11*  ALKPHOS 59  BILITOT 0.7  PROT 8.0  ALBUMIN 3.9   No results for input(s): LIPASE, AMYLASE in the last 168 hours. No results for input(s): AMMONIA in the last 168 hours. CBC:  Recent Labs Lab 04/14/15 1511  WBC 12.8*  HGB 10.6*  HCT 31.3*  MCV 95.4  PLT 177   Cardiac Enzymes:  Recent Labs Lab 04/14/15 1511  TROPONINI 0.16*    BNP (last 3 results) No results for input(s): BNP in the last 8760 hours.  ProBNP (last 3 results) No results for input(s): PROBNP in the last 8760 hours.  CBG: No results for input(s): GLUCAP in the last 168 hours.  Radiological Exams on Admission: Dg Chest Portable 1 View  04/14/2015   CLINICAL DATA:   74 year old male with nausea, vomiting and diarrhea gradually worsening over the past week. Atrial fibrillation.  EXAM: PORTABLE CHEST - 1 VIEW  COMPARISON:  No priors.  FINDINGS: Lung volumes are low. Ill-defined opacity at the left lung base obscuring the left hemidiaphragm. No definite pleural effusions. Heart size is upper limits of normal. The patient is rotated to the left on today's exam, resulting in distortion of the mediastinal contours and reduced diagnostic sensitivity and specificity for mediastinal pathology.  IMPRESSION: 1. Left lower lobe opacity concerning for pneumonia. Followup PA and lateral chest X-ray is recommended in 3-4 weeks following trial of antibiotic therapy to ensure resolution and exclude underlying malignancy.   Electronically Signed   By: Vinnie Langton M.D.   On: 04/14/2015 16:41    EKG: Independently reviewed. Atrial fibrillation with RVR.  Assessment/Plan Principal Problem:   Sepsis Active Problems:   Atrial fibrillation with RVR   CAP (community acquired pneumonia)   Essential hypertension   Elevated troponin   Normocytic hypochromic anemia   CKD (chronic kidney disease) stage 4, GFR 15-29 ml/min   1. Sepsis from pneumonia - blood cultures has been obtained. Check pro-calcitonin levels and recheck lactic acid levels. Patient did receive a liter bolus in the ER but we will hold off further fluid until patient is making any urine output given the patient also has renal failure. Patient has been placed on ceftriaxone and Zithromax. Check urine Legionella and strep antigen and HIV status and influenza PCR. Patient will need repeat chest x-ray in 4-5 weeks to confirm resolution of the infiltrate. 2. Renal failure probably acute on chronic - patient is to give a urine sample though patient states he has urinated today. I have ordered abdominal sonogram and we'll closely follow intake output and metabolic panel. Patient did receive 1 L normal saline bolus in the ER  we'll hold off further fluids until patient is reliably making any urine output. Check FENa, SPEP UPEP phosphorus levels and patient will need nephrology consult in a.m. 3. Atrial fibrillation RVR - discussed with Dr. Terrence Dupont on-call cardiologist who will be seeing patient in consult. Patient is in sinus rhythm at this time. Cardiologist is planning to place patient on amiodarone and Cardizem by mouth and Cardizem infusion will be weaned off. Patient presently is on heparin infusion which will be continued. Check 2-D echo. Further recommendations per cardiologist. 4. Elevated troponin - probably related to heart rate. Repeat EKG has been ordered. Patient denies any chest pain. Check 2-D echo. Further recommendations per cardiologist. 5. Hypertension - closely follow blood pressure trends. See #3. 6. Normocytic normochromic anemia - check anemia panel. Stool for blood has been negative. Follow CBC closely. Anemia could be from renal failure.  I have discussed on-call cardiologist and get patient's charts and personally  reviewed patient's chest x-ray and EKG.   DVT Prophylaxis Lovenox.  Code Status: DO NOT RESUSCITATE.  Family Communication: Discussed patient's wife.  Disposition Plan: Admit to inpatient.    Tayvia Faughnan N. [facility] Pager [phone].  If 7PM-7AM, please contact night-coverage [email or web address] Password [facility] 04/14/2015, 9:32 PM

## 2015-04-14 NOTE — ED Notes (Signed)
MD at bedside. 

## 2015-04-14 NOTE — ED Notes (Signed)
Dr Doy Mince at bedside- notified of pt's troponin 0.16

## 2015-04-15 ENCOUNTER — Other Ambulatory Visit (HOSPITAL_COMMUNITY): Payer: Self-pay

## 2015-04-15 ENCOUNTER — Inpatient Hospital Stay (HOSPITAL_COMMUNITY): Payer: Medicare Other

## 2015-04-15 DIAGNOSIS — N179 Acute kidney failure, unspecified: Secondary | ICD-10-CM | POA: Diagnosis not present

## 2015-04-15 DIAGNOSIS — E780 Pure hypercholesterolemia: Secondary | ICD-10-CM | POA: Diagnosis not present

## 2015-04-15 DIAGNOSIS — R310 Gross hematuria: Secondary | ICD-10-CM | POA: Diagnosis not present

## 2015-04-15 DIAGNOSIS — Z791 Long term (current) use of non-steroidal anti-inflammatories (NSAID): Secondary | ICD-10-CM | POA: Diagnosis not present

## 2015-04-15 DIAGNOSIS — I4892 Unspecified atrial flutter: Secondary | ICD-10-CM | POA: Diagnosis not present

## 2015-04-15 DIAGNOSIS — I4891 Unspecified atrial fibrillation: Secondary | ICD-10-CM | POA: Diagnosis not present

## 2015-04-15 DIAGNOSIS — N184 Chronic kidney disease, stage 4 (severe): Secondary | ICD-10-CM | POA: Diagnosis not present

## 2015-04-15 DIAGNOSIS — N390 Urinary tract infection, site not specified: Secondary | ICD-10-CM | POA: Diagnosis not present

## 2015-04-15 DIAGNOSIS — R7881 Bacteremia: Secondary | ICD-10-CM

## 2015-04-15 DIAGNOSIS — D509 Iron deficiency anemia, unspecified: Secondary | ICD-10-CM | POA: Diagnosis not present

## 2015-04-15 DIAGNOSIS — N320 Bladder-neck obstruction: Secondary | ICD-10-CM | POA: Diagnosis not present

## 2015-04-15 DIAGNOSIS — E860 Dehydration: Secondary | ICD-10-CM | POA: Diagnosis not present

## 2015-04-15 DIAGNOSIS — K449 Diaphragmatic hernia without obstruction or gangrene: Secondary | ICD-10-CM | POA: Diagnosis not present

## 2015-04-15 DIAGNOSIS — J189 Pneumonia, unspecified organism: Secondary | ICD-10-CM | POA: Diagnosis not present

## 2015-04-15 DIAGNOSIS — Z7982 Long term (current) use of aspirin: Secondary | ICD-10-CM | POA: Diagnosis not present

## 2015-04-15 DIAGNOSIS — I129 Hypertensive chronic kidney disease with stage 1 through stage 4 chronic kidney disease, or unspecified chronic kidney disease: Secondary | ICD-10-CM | POA: Diagnosis not present

## 2015-04-15 DIAGNOSIS — D638 Anemia in other chronic diseases classified elsewhere: Secondary | ICD-10-CM | POA: Diagnosis not present

## 2015-04-15 DIAGNOSIS — N401 Enlarged prostate with lower urinary tract symptoms: Secondary | ICD-10-CM | POA: Diagnosis not present

## 2015-04-15 DIAGNOSIS — E119 Type 2 diabetes mellitus without complications: Secondary | ICD-10-CM | POA: Diagnosis not present

## 2015-04-15 DIAGNOSIS — A4151 Sepsis due to Escherichia coli [E. coli]: Secondary | ICD-10-CM | POA: Diagnosis not present

## 2015-04-15 DIAGNOSIS — I248 Other forms of acute ischemic heart disease: Secondary | ICD-10-CM | POA: Diagnosis not present

## 2015-04-15 DIAGNOSIS — Z66 Do not resuscitate: Secondary | ICD-10-CM | POA: Diagnosis not present

## 2015-04-15 DIAGNOSIS — N138 Other obstructive and reflux uropathy: Secondary | ICD-10-CM | POA: Diagnosis not present

## 2015-04-15 HISTORY — DX: Bacteremia: R78.81

## 2015-04-15 LAB — LIPID PANEL
Cholesterol: 136 mg/dL (ref 0–200)
HDL: 23 mg/dL — ABNORMAL LOW (ref >40)
LDL CALC: 86 mg/dL (ref 0–99)
Total CHOL/HDL Ratio: 5.9 RATIO
Triglycerides: 134 mg/dL (ref <150)
VLDL: 27 mg/dL (ref 0–40)

## 2015-04-15 LAB — COMPREHENSIVE METABOLIC PANEL
ALT: 13 U/L — ABNORMAL LOW (ref 17–63)
AST: 17 U/L (ref 15–41)
Albumin: 3 g/dL — ABNORMAL LOW (ref 3.5–5.0)
Alkaline Phosphatase: 53 U/L (ref 38–126)
Anion gap: 13 (ref 5–15)
BILIRUBIN TOTAL: 0.5 mg/dL (ref 0.3–1.2)
BUN: 84 mg/dL — AB (ref 6–20)
CHLORIDE: 100 mmol/L — AB (ref 101–111)
CO2: 19 mmol/L — AB (ref 22–32)
Calcium: 8.4 mg/dL — ABNORMAL LOW (ref 8.9–10.3)
Creatinine, Ser: 6.29 mg/dL — ABNORMAL HIGH (ref 0.61–1.24)
GFR, EST AFRICAN AMERICAN: 9 mL/min — AB (ref >60)
GFR, EST NON AFRICAN AMERICAN: 8 mL/min — AB (ref >60)
Glucose, Bld: 141 mg/dL — ABNORMAL HIGH (ref 65–99)
POTASSIUM: 4.4 mmol/L (ref 3.5–5.1)
Sodium: 132 mmol/L — ABNORMAL LOW (ref 135–145)
Total Protein: 6.2 g/dL — ABNORMAL LOW (ref 6.5–8.1)

## 2015-04-15 LAB — CBC WITH DIFFERENTIAL/PLATELET
BASOS PCT: 0 % (ref 0–1)
Basophils Absolute: 0 10*3/uL (ref 0.0–0.1)
EOS ABS: 0 10*3/uL (ref 0.0–0.7)
EOS PCT: 0 % (ref 0–5)
HCT: 25.7 % — ABNORMAL LOW (ref 39.0–52.0)
HEMOGLOBIN: 9 g/dL — AB (ref 13.0–17.0)
Lymphocytes Relative: 3 % — ABNORMAL LOW (ref 12–46)
Lymphs Abs: 0.3 10*3/uL — ABNORMAL LOW (ref 0.7–4.0)
MCH: 32.5 pg (ref 26.0–34.0)
MCHC: 35 g/dL (ref 30.0–36.0)
MCV: 92.8 fL (ref 78.0–100.0)
MONO ABS: 0.4 10*3/uL (ref 0.1–1.0)
Monocytes Relative: 5 % (ref 3–12)
Neutro Abs: 6.8 10*3/uL (ref 1.7–7.7)
Neutrophils Relative %: 92 % — ABNORMAL HIGH (ref 43–77)
PLATELETS: 122 10*3/uL — AB (ref 150–400)
RBC: 2.77 MIL/uL — ABNORMAL LOW (ref 4.22–5.81)
RDW: 13.1 % (ref 11.5–15.5)
WBC: 7.4 10*3/uL (ref 4.0–10.5)

## 2015-04-15 LAB — URINALYSIS, ROUTINE W REFLEX MICROSCOPIC
Bilirubin Urine: NEGATIVE
Glucose, UA: NEGATIVE mg/dL
Ketones, ur: NEGATIVE mg/dL
Nitrite: NEGATIVE
PROTEIN: 100 mg/dL — AB
Specific Gravity, Urine: 1.01 (ref 1.005–1.030)
Urobilinogen, UA: 0.2 mg/dL (ref 0.0–1.0)
pH: 6 (ref 5.0–8.0)

## 2015-04-15 LAB — TYPE AND SCREEN
ABO/RH(D): O POS
Antibody Screen: NEGATIVE

## 2015-04-15 LAB — URINE MICROSCOPIC-ADD ON

## 2015-04-15 LAB — HEPARIN LEVEL (UNFRACTIONATED)
HEPARIN UNFRACTIONATED: 0.16 [IU]/mL — AB (ref 0.30–0.70)
Heparin Unfractionated: 0.29 IU/mL — ABNORMAL LOW (ref 0.30–0.70)

## 2015-04-15 LAB — INFLUENZA PANEL BY PCR (TYPE A & B)
H1N1 flu by pcr: NOT DETECTED
INFLAPCR: NEGATIVE
Influenza B By PCR: NEGATIVE

## 2015-04-15 LAB — STREP PNEUMONIAE URINARY ANTIGEN: STREP PNEUMO URINARY ANTIGEN: NEGATIVE

## 2015-04-15 LAB — HIV ANTIBODY (ROUTINE TESTING W REFLEX): HIV SCREEN 4TH GENERATION: NONREACTIVE

## 2015-04-15 LAB — SODIUM, URINE, RANDOM: Sodium, Ur: 47 mmol/L

## 2015-04-15 LAB — FOLATE: Folate: 7.1 ng/mL (ref >5.9)

## 2015-04-15 LAB — VITAMIN B12: Vitamin B-12: 434 pg/mL (ref 180–914)

## 2015-04-15 LAB — FERRITIN: Ferritin: 327 ng/mL (ref 24–336)

## 2015-04-15 LAB — IRON AND TIBC
Iron: 6 ug/dL — ABNORMAL LOW (ref 45–182)
Saturation Ratios: 3 % — ABNORMAL LOW (ref 17.9–39.5)
TIBC: 231 ug/dL — ABNORMAL LOW (ref 250–450)
UIBC: 225 ug/dL

## 2015-04-15 LAB — TROPONIN I
TROPONIN I: 0.18 ng/mL — AB (ref <0.031)
Troponin I: 0.14 ng/mL — ABNORMAL HIGH (ref <0.031)

## 2015-04-15 LAB — TSH
TSH: 1.125 u[IU]/mL (ref 0.350–4.500)
TSH: 0.877 u[IU]/mL (ref 0.350–4.500)

## 2015-04-15 LAB — CREATININE, URINE, RANDOM: Creatinine, Urine: 71.49 mg/dL

## 2015-04-15 LAB — ABO/RH: ABO/RH(D): O POS

## 2015-04-15 MED ORDER — METOPROLOL TARTRATE 1 MG/ML IV SOLN
5.0000 mg | Freq: Once | INTRAVENOUS | Status: AC
  Administered 2015-04-15: 5 mg via INTRAVENOUS
  Filled 2015-04-15: qty 5

## 2015-04-15 MED ORDER — HEPARIN BOLUS VIA INFUSION
2000.0000 [IU] | Freq: Once | INTRAVENOUS | Status: AC
  Administered 2015-04-15: 2000 [IU] via INTRAVENOUS
  Filled 2015-04-15: qty 2000

## 2015-04-15 MED ORDER — SODIUM CHLORIDE 0.9 % IV BOLUS (SEPSIS)
250.0000 mL | Freq: Once | INTRAVENOUS | Status: AC
Start: 2015-04-15 — End: 2015-04-15
  Administered 2015-04-15: 250 mL via INTRAVENOUS

## 2015-04-15 MED ORDER — OFF THE BEAT BOOK
Freq: Once | Status: AC
  Administered 2015-04-15: 1
  Filled 2015-04-15 (×2): qty 1

## 2015-04-15 MED ORDER — SODIUM CHLORIDE 0.9 % IV SOLN
INTRAVENOUS | Status: DC
  Administered 2015-04-15 – 2015-04-21 (×6): via INTRAVENOUS
  Filled 2015-04-15: qty 1000

## 2015-04-15 MED ORDER — PIPERACILLIN-TAZOBACTAM IN DEX 2-0.25 GM/50ML IV SOLN
2.2500 g | Freq: Three times a day (TID) | INTRAVENOUS | Status: DC
  Filled 2015-04-15 (×2): qty 50

## 2015-04-15 MED ORDER — PIPERACILLIN-TAZOBACTAM IN DEX 2-0.25 GM/50ML IV SOLN
2.2500 g | Freq: Three times a day (TID) | INTRAVENOUS | Status: DC
  Administered 2015-04-15 – 2015-04-17 (×6): 2.25 g via INTRAVENOUS
  Filled 2015-04-15 (×8): qty 50

## 2015-04-15 MED ORDER — CETYLPYRIDINIUM CHLORIDE 0.05 % MT LIQD
7.0000 mL | Freq: Two times a day (BID) | OROMUCOSAL | Status: DC
  Administered 2015-04-16 – 2015-04-20 (×7): 7 mL via OROMUCOSAL

## 2015-04-15 NOTE — Progress Notes (Signed)
Utilization Review Completed.  

## 2015-04-15 NOTE — Progress Notes (Signed)
Notified per CCMD that the patient's heart rhythm had converted to flutter in the 110's to 120's. Paged Dr. Carles Collet to make him aware. Patient heart rate 90's to 110's currently.

## 2015-04-15 NOTE — Progress Notes (Addendum)
ANTICOAGULATION CONSULT NOTE - Follow Up Consult  Pharmacy Consult for Heparin Indication: atrial fibrillation  No Known Allergies  Patient Measurements: Height: 5\' 4"  (162.6 cm) Weight: 200 lb 2.8 oz (90.8 kg) IBW/kg (Calculated) : 59.2 Heparin Dosing Weight: 78.8  Vital Signs: Temp: 97.5 F (36.4 C) (07/26 1206) Temp Source: Oral (07/26 1206) BP: 128/66 mmHg (07/26 1206) Pulse Rate: 61 (07/26 1206)  Labs:  Recent Labs  04/14/15 1511 04/14/15 2218 04/15/15 0355 04/15/15 0921 04/15/15 1230  HGB 10.6*  --  9.0*  --   --   HCT 31.3*  --  25.7*  --   --   PLT 177  --  122*  --   --   APTT  --  59*  --   --   --   LABPROT  --  18.9*  --   --   --   INR  --  1.58*  --   --   --   HEPARINUNFRC  --   --  0.16*  --  0.29*  CREATININE 5.33*  --  6.29*  --   --   TROPONINI 0.16* 0.19* 0.18* 0.14*  --     Estimated Creatinine Clearance: 10.9 mL/min (by C-G formula based on Cr of 6.29).   Medications:  Scheduled:  . amiodarone  200 mg Oral BID  . antiseptic oral rinse  7 mL Mouth Rinse BID  . aspirin  81 mg Oral Daily  . metoprolol tartrate  25 mg Oral BID  . off the beat book   Does not apply Once  . piperacillin-tazobactam (ZOSYN)  IV  2.25 g Intravenous 3 times per day  . sodium chloride  3 mL Intravenous Q12H   Infusions:  . diltiazem (CARDIZEM) infusion 5 mg/hr (04/15/15 0526)  . heparin 1,350 Units/hr (04/15/15 0910)  . sodium chloride 0.9 % 1,000 mL infusion 75 mL/hr at 04/15/15 1215   PRN: acetaminophen **OR** acetaminophen, ondansetron **OR** ondansetron (ZOFRAN) IV  Assessment: 73 YOM on heparin for new onset afib.  Pt started on 4000 unit bolus and 1100 unit bolus. Heparin increased on 7/26 with 2000 unit bolus x1 and 1350 unit/hr following HL of 0.16.  HL remains subtherapeutic at 0.29.  No s/sx of bleeding noted.  Hgb low 9.0, Plts low 122.    Goal of Therapy:  Heparin level 0.3-0.7 units/ml Monitor platelets by anticoagulation protocol: Yes    Plan:  Increase heparin gtt to 1500 units/hr.  Daily HL/CBC Monitor for s/sx of bleeding.   Kem Parkinson, PharmD Pharmacy Resident 04/15/2015,2:21 PM

## 2015-04-15 NOTE — Progress Notes (Signed)
Subjective:  Patient denies any chest pain or shortness of breath complains of difficulty voiding. Abdominal ultrasound showed bilateral hydronephrosis. Bladder scan showed 800 mL of urine in the bladder. Cardiac enzymes trending down patient remains in sinus rhythm  Objective:  Vital Signs in the last 24 hours: Temp:  [97.8 F (36.6 C)-102.4 F (39.1 C)] 97.8 F (36.6 C) (07/26 0752) Pulse Rate:  [31-129] 64 (07/26 0752) Resp:  [18-38] 25 (07/26 0752) BP: (99-184)/(58-97) 115/65 mmHg (07/26 0752) SpO2:  [78 %-100 %] 99 % (07/26 0752) Weight:  [88.905 kg (196 lb)-90.8 kg (200 lb 2.8 oz)] 90.8 kg (200 lb 2.8 oz) (07/26 0500)  Intake/Output from previous day: 07/25 0701 - 07/26 0700 In: 414 [P.O.:240; I.V.:174] Out: -  Intake/Output from this shift:    Physical Exam: Neck: no adenopathy, no carotid bruit, no JVD and supple, symmetrical, trachea midline Lungs: Decreased breath sound at bases with occasional left rhonchi Heart: regular rate and rhythm, S1, S2 normal and Soft systolic murmur noted Abdomen: Soft bowel sounds present distended Extremities: extremities normal, atraumatic, no cyanosis or edema  Lab Results:  Recent Labs  04/14/15 1511 04/15/15 0355  WBC 12.8* 7.4  HGB 10.6* 9.0*  PLT 177 122*    Recent Labs  04/14/15 1511 04/15/15 0355  NA 136 132*  K 4.7 4.4  CL 100* 100*  CO2 21* 19*  GLUCOSE 142* 141*  BUN 73* 84*  CREATININE 5.33* 6.29*    Recent Labs  04/14/15 2218 04/15/15 0355  TROPONINI 0.19* 0.18*   Hepatic Function Panel  Recent Labs  04/15/15 0355  PROT 6.2*  ALBUMIN 3.0*  AST 17  ALT 13*  ALKPHOS 53  BILITOT 0.5    Recent Labs  04/15/15 0355  CHOL 136   No results for input(s): PROTIME in the last 72 hours.  Imaging: Imaging results have been reviewed and US Abdomen Complete  04/15/2015   CLINICAL DATA:  74 year old hypertensive male with abdominal pain, nausea, vomiting diarrhea with loss of appetite worse over the  past week. Initial encounter.  EXAM: ULTRASOUND ABDOMEN COMPLETE  COMPARISON:  None.  FINDINGS: Gallbladder: No gallstones or wall thickening visualized. No sonographic Murphy sign noted.  Common bile duct: Diameter: 4.1 mm.  Liver: Mild increased echogenicity may represent mild fatty infiltration without mass identified.  IVC: No abnormality visualized.  Pancreas: Poorly delineated secondary to bowel gas.  Spleen: Size and appearance within normal limits.  Right Kidney: Length: 14.3 cm.  Hydronephrosis.  No obvious mass.  Left Kidney: Length: 14.5 cm.  Hydronephrosis.  No obvious mass.  Abdominal aorta: Poorly delineated secondary to bowel gas. Proximal aspect measures up to 3 cm.  Other findings: None.  IMPRESSION: Bilateral prominent hydronephrosis.  Etiology indeterminate.  Poor delineation of the pancreas and abdominal aorta secondary to bowel gas. Proximal abdominal aorta measures up to 3 cm.  Question mild fatty infiltration the liver.   Electronically Signed   By: Genia Del M.D.   On: 04/15/2015 07:21   Dg Chest Portable 1 View  04/14/2015   CLINICAL DATA:  74 year old male with nausea, vomiting and diarrhea gradually worsening over the past week. Atrial fibrillation.  EXAM: PORTABLE CHEST - 1 VIEW  COMPARISON:  No priors.  FINDINGS: Lung volumes are low. Ill-defined opacity at the left lung base obscuring the left hemidiaphragm. No definite pleural effusions. Heart size is upper limits of normal. The patient is rotated to the left on today's exam, resulting in distortion of the mediastinal contours and  reduced diagnostic sensitivity and specificity for mediastinal pathology.  IMPRESSION: 1. Left lower lobe opacity concerning for pneumonia. Followup PA and lateral chest X-ray is recommended in 3-4 weeks following trial of antibiotic therapy to ensure resolution and exclude underlying malignancy.   Electronically Signed   By: Vinnie Langton M.D.   On: 04/14/2015 16:41    Cardiac  Studies:  Assessment/Plan:  Status post  A. fib with RVR chads Vas score of 3 Probable very small type II MI secondary to demand ischemia/abnormal EKG rule out coronary insufficiency Hypertension New-onset diabetes Hypercholesteremia Community acquired left pneumonia Probable acute on chronic kidney injury secondary to obstructive uropathy Dehydration Anemia of chronic disease Status post rectal bleeding Plan DC Cardizem Check 2-D echo Monitor renal function/H&H closely Will discuss with patient and family once his renal, pulmonary, GI, issues sorted out regarding further cardiac evaluation.  LOS: 1 day    Charolette Forward 04/15/2015, 8:05 AM

## 2015-04-15 NOTE — Progress Notes (Signed)
  Echocardiogram 2D Echocardiogram has been performed.  [name] 04/15/2015, 9:22 AM

## 2015-04-15 NOTE — Progress Notes (Signed)
Case was discussed with Urology--Dr. Matilde Sprang  He felt pt's bilateral hydronephrosis is due to BOO. No urologic intervention necessary at this time if renal function continues to improve.  Continue to follow renal function.  Reconsult if clinical situation changes. He recommended to keep foley catheter in place even AFTER discharge until pt is seen in the office. He recommended starting Flomax 0.4mg  daily when pt is ready for d/c and followup in office in 1-2 weeks after discharge for voiding trial.  DTat

## 2015-04-15 NOTE — Progress Notes (Signed)
Bladder scanned in ultrasound. 800cc in bladder. Patient states he 'could pee' but unable to urinate when attempted with urinal. Dr. Hal Hope aware and verbal order to place foley catheter. Will report to AM nurse.  M.Forest Gleason, RN

## 2015-04-15 NOTE — Consult Note (Signed)
Reason for Consult: Acute Renal Failure Referring Physician: Dr. Jannet Mantis. is an 74 y.o. male.   HPI: [name] is a 74 yo male with PMHx of HTN who presented to the ED on 04/15/15 with complaint of 2-3 weeks of weakness, poor appetite, fatigue, and nausea. Patient was found to be septic likely from UTI, in respiratory distress requiring bipap and in afib with RVR which converted to NSR with cardizem and lopressor. Patient was also found to be in acute renal failure for which Nephrology was consulted for. Creatinine on admission was 5.33 which trended up to 6.29 today despite IVF. We do not have a known SCr or GFR baseline. UA revealed moderate hemoglobin, proteinuria >100, many bacteria, WBC TNTC, and large leukocytes. Urine sodium 47, urine creatinine 71.49. Abdominal US showed bilateral prominent hydronephrosis.  Patient was seen and examined. He denies any complaints, but does admit to a history of increased urinary frequency with intermittent difficulty with urination and urinary hesitancy.    Past Medical History  Diagnosis Date  . Hypertension     Past Surgical History  Procedure Laterality Date  . Tonsillectomy      Family History  Problem Relation Age of Onset  . Breast cancer Mother    Social History:  reports that he has never smoked. He does not have any smokeless tobacco history on file. He reports that he does not drink alcohol or use illicit drugs.  Allergies: No Known Allergies  Medications:  I have reviewed the patient's current medications. Prior to Admission:  Prescriptions prior to admission  Medication Sig Dispense Refill Last Dose  . aspirin 325 MG tablet Take 325 mg by mouth every 4 (four) hours as needed for mild pain, moderate pain or headache.   over 30 days  . Aspirin-Acetaminophen (GOODY BODY PAIN) 500-325 MG PACK Take 1 Package by mouth daily as needed (pain, headaches).   04/14/2015 at 1200   Scheduled: . amiodarone  200 mg Oral BID  .  antiseptic oral rinse  7 mL Mouth Rinse BID  . aspirin  81 mg Oral Daily  . metoprolol tartrate  25 mg Oral BID  . off the beat book   Does not apply Once  . piperacillin-tazobactam (ZOSYN)  IV  2.25 g Intravenous 3 times per day  . sodium chloride  3 mL Intravenous Q12H   Continuous: . diltiazem (CARDIZEM) infusion 5 mg/hr (04/15/15 0526)  . heparin 1,350 Units/hr (04/15/15 0910)  . sodium chloride 0.9 % 1,000 mL infusion 75 mL/hr at 04/15/15 1215   MVV:[id number] **OR** acetaminophen, ondansetron **OR** ondansetron (ZOFRAN) IV  Results for orders placed or performed during the hospital encounter of 04/14/15 (from the past 48 hour(s))  CBC     Status: Abnormal   Collection Time: 04/14/15  3:11 PM  Result Value Ref Range   WBC 12.8 (H) 4.0 - 10.5 K/uL   RBC 3.28 (L) 4.22 - 5.81 MIL/uL   Hemoglobin 10.6 (L) 13.0 - 17.0 g/dL   HCT 31.3 (L) 39.0 - 52.0 %   MCV 95.4 78.0 - 100.0 fL   MCH 32.3 26.0 - 34.0 pg   MCHC 33.9 30.0 - 36.0 g/dL   RDW 12.7 11.5 - 15.5 %   Platelets 177 150 - 400 K/uL  Comprehensive metabolic panel     Status: Abnormal   Collection Time: 04/14/15  3:11 PM  Result Value Ref Range   Sodium 136 135 - 145 mmol/L   Potassium 4.7 3.5 -  5.1 mmol/L   Chloride 100 (L) 101 - 111 mmol/L   CO2 21 (L) 22 - 32 mmol/L   Glucose, Bld 142 (H) 65 - 99 mg/dL   BUN 73 (H) 6 - 20 mg/dL   Creatinine, Ser 5.33 (H) 0.61 - 1.24 mg/dL   Calcium 9.5 8.9 - 10.3 mg/dL   Total Protein 8.0 6.5 - 8.1 g/dL   Albumin 3.9 3.5 - 5.0 g/dL   AST 22 15 - 41 U/L   ALT 11 (L) 17 - 63 U/L   Alkaline Phosphatase 59 38 - 126 U/L   Total Bilirubin 0.7 0.3 - 1.2 mg/dL   GFR calc non Af Amer 10 (L) >60 mL/min   GFR calc Af Amer 11 (L) >60 mL/min    Comment: (NOTE) The eGFR has been calculated using the CKD EPI equation. This calculation has not been validated in all clinical situations. eGFR's persistently <60 mL/min signify possible Chronic Kidney Disease.    Anion gap 15 5 - 15   Troponin I     Status: Abnormal   Collection Time: 04/14/15  3:11 PM  Result Value Ref Range   Troponin I 0.16 (H) <0.031 ng/mL    Comment:        PERSISTENTLY INCREASED TROPONIN VALUES IN THE RANGE OF 0.04-0.49 ng/mL CAN BE SEEN IN:       -UNSTABLE ANGINA       -CONGESTIVE HEART FAILURE       -MYOCARDITIS       -CHEST TRAUMA       -ARRYHTHMIAS       -LATE PRESENTING MYOCARDIAL INFARCTION       -COPD   CLINICAL FOLLOW-UP RECOMMENDED.   Occult blood card to lab, stool RN will collect     Status: None   Collection Time: 04/14/15  3:25 PM  Result Value Ref Range   Fecal Occult Bld NEGATIVE NEGATIVE  Blood culture (routine x 2)     Status: None (Preliminary result)   Collection Time: 04/14/15  5:15 PM  Result Value Ref Range   Specimen Description BLOOD LEFT ANTECUBITAL    Special Requests BOTTLES DRAWN AEROBIC AND ANAEROBIC 5CC EACH    Culture  Setup Time      GRAM NEGATIVE RODS IN BOTH AEROBIC AND ANAEROBIC BOTTLES CRITICAL RESULT CALLED TO, READ BACK BY AND VERIFIED WITH: A AGUIRRE,RN AT 9390 04/15/15 BY L BENFIELD    Culture      GRAM NEGATIVE RODS Performed at [facility]    Report Status PENDING   Blood culture (routine x 2)     Status: None (Preliminary result)   Collection Time: 04/14/15  5:30 PM  Result Value Ref Range   Specimen Description BLOOD RIGHT ARM    Special Requests BOTTLES DRAWN AEROBIC ONLY 4CC    Culture  Setup Time      GRAM NEGATIVE RODS AEROBIC BOTTLE ONLY CRITICAL RESULT CALLED TO, READ BACK BY AND VERIFIED WITH: C WHITE,RN AT 1055 04/15/15 BY L BENFIELD    Culture      GRAM NEGATIVE RODS Performed at [facility]    Report Status PENDING   I-Stat arterial blood gas, ED     Status: Abnormal   Collection Time: 04/14/15  6:43 PM  Result Value Ref Range   pH, Arterial 7.489 (H) 7.350 - 7.450   pCO2 arterial 24.0 (L) 35.0 - 45.0 mmHg   pO2, Arterial 58.0 (L) 80.0 - 100.0 mmHg   Bicarbonate 18.2 (L) 20.0 -  24.0 mEq/L   TCO2 19  0 - 100 mmol/L   O2 Saturation 92.0 %   Acid-base deficit 4.0 (H) 0.0 - 2.0 mmol/L   Patient temperature 99.7 F    Collection site RADIAL, ALLEN'S TEST ACCEPTABLE    Drawn by RT    Sample type ARTERIAL   MRSA PCR Screening     Status: None   Collection Time: 04/14/15  9:50 PM  Result Value Ref Range   MRSA by PCR NEGATIVE NEGATIVE    Comment:        The GeneXpert MRSA Assay (FDA approved for NASAL specimens only), is one component of a comprehensive MRSA colonization surveillance program. It is not intended to diagnose MRSA infection nor to guide or monitor treatment for MRSA infections.   Vitamin B12     Status: None   Collection Time: 04/14/15 10:18 PM  Result Value Ref Range   Vitamin B-12 434 180 - 914 pg/mL    Comment: (NOTE) This assay is not validated for testing neonatal or myeloproliferative syndrome specimens for Vitamin B12 levels.   Folate     Status: None   Collection Time: 04/14/15 10:18 PM  Result Value Ref Range   Folate 7.1 >5.9 ng/mL  Iron and TIBC     Status: Abnormal   Collection Time: 04/14/15 10:18 PM  Result Value Ref Range   Iron 6 (L) 45 - 182 ug/dL   TIBC 231 (L) 250 - 450 ug/dL   Saturation Ratios 3 (L) 17.9 - 39.5 %   UIBC 225 ug/dL  Ferritin     Status: None   Collection Time: 04/14/15 10:18 PM  Result Value Ref Range   Ferritin 327 24 - 336 ng/mL  Reticulocytes     Status: Abnormal   Collection Time: 04/14/15 10:18 PM  Result Value Ref Range   Retic Ct Pct 1.0 0.4 - 3.1 %   RBC. 2.10 (L) 4.22 - 5.81 MIL/uL   Retic Count, Manual 21.0 19.0 - 186.0 K/uL  Lactic acid, plasma     Status: None   Collection Time: 04/14/15 10:18 PM  Result Value Ref Range   Lactic Acid, Venous 1.2 0.5 - 2.0 mmol/L  Procalcitonin     Status: None   Collection Time: 04/14/15 10:18 PM  Result Value Ref Range   Procalcitonin 16.49 ng/mL    Comment:        Interpretation: PCT >= 10 ng/mL: Important systemic inflammatory response, almost exclusively due  to severe bacterial sepsis or septic shock. (NOTE)         ICU PCT Algorithm               Non ICU PCT Algorithm    ----------------------------     ------------------------------         PCT < 0.25 ng/mL                 PCT < 0.1 ng/mL     Stopping of antibiotics            Stopping of antibiotics       strongly encouraged.               strongly encouraged.    ----------------------------     ------------------------------       PCT level decrease by               PCT < 0.25 ng/mL       >= 80% from peak PCT  OR PCT 0.25 - 0.5 ng/mL          Stopping of antibiotics                                             encouraged.     Stopping of antibiotics           encouraged.    ----------------------------     ------------------------------       PCT level decrease by              PCT >= 0.25 ng/mL       < 80% from peak PCT        AND PCT >= 0.5 ng/mL             Continuing antibiotics                                              encouraged.       Continuing antibiotics            encouraged.    ----------------------------     ------------------------------     PCT level increase compared          PCT > 0.5 ng/mL         with peak PCT AND          PCT >= 0.5 ng/mL             Escalation of antibiotics                                          strongly encouraged.      Escalation of antibiotics        strongly encouraged.   Protime-INR     Status: Abnormal   Collection Time: 04/14/15 10:18 PM  Result Value Ref Range   Prothrombin Time 18.9 (H) 11.6 - 15.2 seconds   INR 1.58 (H) 0.00 - 1.49  APTT     Status: Abnormal   Collection Time: 04/14/15 10:18 PM  Result Value Ref Range   aPTT 59 (H) 24 - 37 seconds    Comment:        IF BASELINE aPTT IS ELEVATED, SUGGEST PATIENT RISK ASSESSMENT BE USED TO DETERMINE APPROPRIATE ANTICOAGULANT THERAPY.   TSH     Status: None   Collection Time: 04/14/15 10:18 PM  Result Value Ref Range   TSH 1.125 0.350 - 4.500 uIU/mL  Phosphorus      Status: None   Collection Time: 04/14/15 10:18 PM  Result Value Ref Range   Phosphorus 4.2 2.5 - 4.6 mg/dL  Troponin I     Status: Abnormal   Collection Time: 04/14/15 10:18 PM  Result Value Ref Range   Troponin I 0.19 (H) <0.031 ng/mL    Comment:        PERSISTENTLY INCREASED TROPONIN VALUES IN THE RANGE OF 0.04-0.49 ng/mL CAN BE SEEN IN:       -UNSTABLE ANGINA       -CONGESTIVE HEART FAILURE       -MYOCARDITIS       -CHEST TRAUMA       -ARRYHTHMIAS       -  LATE PRESENTING MYOCARDIAL INFARCTION       -COPD   CLINICAL FOLLOW-UP RECOMMENDED.   Type and screen     Status: None   Collection Time: 04/14/15 11:08 PM  Result Value Ref Range   ABO/RH(D) O POS    Antibody Screen NEG    Sample Expiration 04/17/2015   ABO/Rh     Status: None   Collection Time: 04/14/15 11:08 PM  Result Value Ref Range   ABO/RH(D) O POS   Influenza panel by PCR (type A & B, H1N1) (Not at [facility])     Status: None   Collection Time: 04/14/15 11:16 PM  Result Value Ref Range   Influenza A By PCR NEGATIVE NEGATIVE   Influenza B By PCR NEGATIVE NEGATIVE   H1N1 flu by pcr NOT DETECTED NOT DETECTED    Comment:        The Xpert Flu assay (FDA approved for nasal aspirates or washes and nasopharyngeal swab specimens), is intended as an aid in the diagnosis of influenza and should not be used as a sole basis for treatment.   Heparin level (unfractionated)     Status: Abnormal   Collection Time: 04/15/15  3:55 AM  Result Value Ref Range   Heparin Unfractionated 0.16 (L) 0.30 - 0.70 IU/mL    Comment:        IF HEPARIN RESULTS ARE BELOW EXPECTED VALUES, AND PATIENT DOSAGE HAS BEEN CONFIRMED, SUGGEST FOLLOW UP TESTING OF ANTITHROMBIN III LEVELS.   Comprehensive metabolic panel     Status: Abnormal   Collection Time: 04/15/15  3:55 AM  Result Value Ref Range   Sodium 132 (L) 135 - 145 mmol/L   Potassium 4.4 3.5 - 5.1 mmol/L   Chloride 100 (L) 101 - 111 mmol/L   CO2 19 (L) 22 - 32 mmol/L   Glucose,  Bld 141 (H) 65 - 99 mg/dL   BUN 84 (H) 6 - 20 mg/dL   Creatinine, Ser 2.77 (H) 0.61 - 1.24 mg/dL   Calcium 8.4 (L) 8.9 - 10.3 mg/dL   Total Protein 6.2 (L) 6.5 - 8.1 g/dL   Albumin 3.0 (L) 3.5 - 5.0 g/dL   AST 17 15 - 41 U/L   ALT 13 (L) 17 - 63 U/L   Alkaline Phosphatase 53 38 - 126 U/L   Total Bilirubin 0.5 0.3 - 1.2 mg/dL   GFR calc non Af Amer 8 (L) >60 mL/min   GFR calc Af Amer 9 (L) >60 mL/min    Comment: (NOTE) The eGFR has been calculated using the CKD EPI equation. This calculation has not been validated in all clinical situations. eGFR's persistently <60 mL/min signify possible Chronic Kidney Disease.    Anion gap 13 5 - 15  CBC WITH DIFFERENTIAL     Status: Abnormal   Collection Time: 04/15/15  3:55 AM  Result Value Ref Range   WBC 7.4 4.0 - 10.5 K/uL   RBC 2.77 (L) 4.22 - 5.81 MIL/uL   Hemoglobin 9.0 (L) 13.0 - 17.0 g/dL   HCT 79.7 (L) 44.3 - 11.0 %   MCV 92.8 78.0 - 100.0 fL   MCH 32.5 26.0 - 34.0 pg   MCHC 35.0 30.0 - 36.0 g/dL   RDW 30.7 85.6 - 87.9 %   Platelets 122 (L) 150 - 400 K/uL   Neutrophils Relative % 92 (H) 43 - 77 %   Neutro Abs 6.8 1.7 - 7.7 K/uL   Lymphocytes Relative 3 (L) 12 - 46 %  Lymphs Abs 0.3 (L) 0.7 - 4.0 K/uL   Monocytes Relative 5 3 - 12 %   Monocytes Absolute 0.4 0.1 - 1.0 K/uL   Eosinophils Relative 0 0 - 5 %   Eosinophils Absolute 0.0 0.0 - 0.7 K/uL   Basophils Relative 0 0 - 1 %   Basophils Absolute 0.0 0.0 - 0.1 K/uL  Troponin I (q 6hr x 3)     Status: Abnormal   Collection Time: 04/15/15  3:55 AM  Result Value Ref Range   Troponin I 0.18 (H) <0.031 ng/mL    Comment:        PERSISTENTLY INCREASED TROPONIN VALUES IN THE RANGE OF 0.04-0.49 ng/mL CAN BE SEEN IN:       -UNSTABLE ANGINA       -CONGESTIVE HEART FAILURE       -MYOCARDITIS       -CHEST TRAUMA       -ARRYHTHMIAS       -LATE PRESENTING MYOCARDIAL INFARCTION       -COPD   CLINICAL FOLLOW-UP RECOMMENDED.   TSH     Status: None   Collection Time: 04/15/15  3:55  AM  Result Value Ref Range   TSH 0.877 0.350 - 4.500 uIU/mL  Lipid panel     Status: Abnormal   Collection Time: 04/15/15  3:55 AM  Result Value Ref Range   Cholesterol 136 0 - 200 mg/dL   Triglycerides 134 <150 mg/dL   HDL 23 (L) >40 mg/dL   Total CHOL/HDL Ratio 5.9 RATIO   VLDL 27 0 - 40 mg/dL   LDL Cholesterol 86 0 - 99 mg/dL    Comment:        Total Cholesterol/HDL:CHD Risk Coronary Heart Disease Risk Table                     Men   Women  1/2 Average Risk   3.4   3.3  Average Risk       5.0   4.4  2 X Average Risk   9.6   7.1  3 X Average Risk  23.4   11.0        Use the calculated Patient Ratio above and the CHD Risk Table to determine the patient's CHD Risk.        ATP III CLASSIFICATION (LDL):  <100     mg/dL   Optimal  100-129  mg/dL   Near or Above                    Optimal  130-159  mg/dL   Borderline  160-189  mg/dL   High  >190     mg/dL   Very High   Sodium, urine, random     Status: None   Collection Time: 04/15/15  5:30 AM  Result Value Ref Range   Sodium, Ur 47 mmol/L  Creatinine, urine, random     Status: None   Collection Time: 04/15/15  5:30 AM  Result Value Ref Range   Creatinine, Urine 71.49 mg/dL  Strep pneumoniae urinary antigen     Status: None   Collection Time: 04/15/15  5:30 AM  Result Value Ref Range   Strep Pneumo Urinary Antigen NEGATIVE NEGATIVE    Comment:        Infection due to S. pneumoniae cannot be absolutely ruled out since the antigen present may be below the detection limit of the test.   Urinalysis, Routine w reflex microscopic (not at  [facility])     Status: Abnormal   Collection Time: 04/15/15  5:56 AM  Result Value Ref Range   Color, Urine YELLOW YELLOW   APPearance CLOUDY (A) CLEAR   Specific Gravity, Urine 1.010 1.005 - 1.030   pH 6.0 5.0 - 8.0   Glucose, UA NEGATIVE NEGATIVE mg/dL   Hgb urine dipstick MODERATE (A) NEGATIVE   Bilirubin Urine NEGATIVE NEGATIVE   Ketones, ur NEGATIVE NEGATIVE mg/dL   Protein, ur  100 (A) NEGATIVE mg/dL   Urobilinogen, UA 0.2 0.0 - 1.0 mg/dL   Nitrite NEGATIVE NEGATIVE   Leukocytes, UA LARGE (A) NEGATIVE  Urine microscopic-add on     Status: Abnormal   Collection Time: 04/15/15  5:56 AM  Result Value Ref Range   Squamous Epithelial / LPF RARE RARE   WBC, UA TOO NUMEROUS TO COUNT <3 WBC/hpf   RBC / HPF 3-6 <3 RBC/hpf   Bacteria, UA MANY (A) RARE  Troponin I (q 6hr x 3)     Status: Abnormal   Collection Time: 04/15/15  9:21 AM  Result Value Ref Range   Troponin I 0.14 (H) <0.031 ng/mL    Comment:        PERSISTENTLY INCREASED TROPONIN VALUES IN THE RANGE OF 0.04-0.49 ng/mL CAN BE SEEN IN:       -UNSTABLE ANGINA       -CONGESTIVE HEART FAILURE       -MYOCARDITIS       -CHEST TRAUMA       -ARRYHTHMIAS       -LATE PRESENTING MYOCARDIAL INFARCTION       -COPD   CLINICAL FOLLOW-UP RECOMMENDED.   Heparin level (unfractionated)     Status: Abnormal   Collection Time: 04/15/15 12:30 PM  Result Value Ref Range   Heparin Unfractionated 0.29 (L) 0.30 - 0.70 IU/mL    Comment:        IF HEPARIN RESULTS ARE BELOW EXPECTED VALUES, AND PATIENT DOSAGE HAS BEEN CONFIRMED, SUGGEST FOLLOW UP TESTING OF ANTITHROMBIN III LEVELS.     US Abdomen Complete  04/15/2015   CLINICAL DATA:  74 year old hypertensive male with abdominal pain, nausea, vomiting diarrhea with loss of appetite worse over the past week. Initial encounter.  EXAM: ULTRASOUND ABDOMEN COMPLETE  COMPARISON:  None.  FINDINGS: Gallbladder: No gallstones or wall thickening visualized. No sonographic Murphy sign noted.  Common bile duct: Diameter: 4.1 mm.  Liver: Mild increased echogenicity may represent mild fatty infiltration without mass identified.  IVC: No abnormality visualized.  Pancreas: Poorly delineated secondary to bowel gas.  Spleen: Size and appearance within normal limits.  Right Kidney: Length: 14.3 cm.  Hydronephrosis.  No obvious mass.  Left Kidney: Length: 14.5 cm.  Hydronephrosis.  No obvious  mass.  Abdominal aorta: Poorly delineated secondary to bowel gas. Proximal aspect measures up to 3 cm.  Other findings: None.  IMPRESSION: Bilateral prominent hydronephrosis.  Etiology indeterminate.  Poor delineation of the pancreas and abdominal aorta secondary to bowel gas. Proximal abdominal aorta measures up to 3 cm.  Question mild fatty infiltration the liver.   Electronically Signed   By: Genia Del M.D.   On: 04/15/2015 07:21   US Pelvis Limited  04/15/2015   CLINICAL DATA:  Acute urine retention.  EXAM: LIMITED ULTRASOUND OF PELVIS  TECHNIQUE: Limited transabdominal ultrasound examination of the pelvis was performed.  COMPARISON:  None.  FINDINGS: The urinary bladder has a normal appearance for the degree of distention.  Pre-void volume:  814.8 ml  Post-void  volume:  Patient unable to void.  Other findings: Mild echogenic debris present within the bladder with mild patchy wall irregularity over the left lateral aspect and dependent portion.  IMPRESSION: Bladder volume 814.8 mL as patient unable to void. Mild internal echogenic debris within the bladder as well as patchy wall irregularity along the left lateral aspect and dependent portion which may be due to infection/inflammatory process or hemorrhagic debris and less likely neoplastic process.   Electronically Signed   By: Marin Olp M.D.   On: 04/15/2015 08:22   Dg Chest Portable 1 View  04/14/2015   CLINICAL DATA:  74 year old male with nausea, vomiting and diarrhea gradually worsening over the past week. Atrial fibrillation.  EXAM: PORTABLE CHEST - 1 VIEW  COMPARISON:  No priors.  FINDINGS: Lung volumes are low. Ill-defined opacity at the left lung base obscuring the left hemidiaphragm. No definite pleural effusions. Heart size is upper limits of normal. The patient is rotated to the left on today's exam, resulting in distortion of the mediastinal contours and reduced diagnostic sensitivity and specificity for mediastinal pathology.   IMPRESSION: 1. Left lower lobe opacity concerning for pneumonia. Followup PA and lateral chest X-ray is recommended in 3-4 weeks following trial of antibiotic therapy to ensure resolution and exclude underlying malignancy.   Electronically Signed   By: Vinnie Langton M.D.   On: 04/14/2015 16:41    ROS General: Denies fever, chills, fatigue  Respiratory: Denies SOB, cough   Cardiovascular: Denies chest pain and palpitations.  Gastrointestinal: Denies nausea, vomiting, abdominal pain Genitourinary: Denies dysuria, suprapubic pain and flank pain. Skin: Denies pallor, rash and wounds.  Neurological: Denies dizziness, headaches, weakness, lightheadedness   Physical Exam Filed Vitals:   04/15/15 0600 04/15/15 0752 04/15/15 0909 04/15/15 1206  BP: 124/67 115/65 114/60 128/66  Pulse: 65 64 60 61  Temp:  97.8 F (36.6 C)  97.5 F (36.4 C)  TempSrc:  Oral  Oral  Resp: 28 25  25   Height:      Weight:      SpO2: 99% 99%  99%   General: Vital signs reviewed.  Patient is elderly male, in no acute distress and cooperative with exam.  Cardiovascular: RRR, S1 normal, S2 normal, no murmurs, gallops, or rubs. Pulmonary/Chest: Clear to auscultation bilaterally, no wheezes, rales, or rhonchi. Abdominal: Soft, non-tender, non-distended, BS +, no masses, organomegaly, or guarding present.  GU: Foley in place with pink cloudy urine Extremities: No lower extremity edema bilaterally, pulses symmetric and intact bilaterally. Neurological: Alert, appropriate  Skin: Warm, dry and intact. No rashes or erythema. Psychiatric: Normal mood and affect. speech and behavior is normal. Cognition and memory are normal.   Assessment/Plan:  Acute Renal Failure: Creatinine has trended 5.33>6.29 despite IVF. GFR 9-10. Unknow baseline, but we suspect underlying kidney disease. Of note, patient use Goody powders regularly for headache. UA revealed UTI. Urine sodium 47, urine creatinine 71.49, giving a FENa of 3.1%.  Abdominal US showed bilateral prominent hydronephrosis of unknown etiology. FENa is indicative of an intrinsic renal cause; however, there is likely a mixed picture. Pre-renal etiologies include sepsis and NSAID abuse (goody powder), and possibly initial afib with RVR. Intrinsic causes include infection/pyelonephritis and possible contribution from NSAID use. However, the most likely etiology is post-renal from bladder obstruction from BPH versus constipation. This is supported by bilateral hydronephrosis, UTI, and h/o urinary hesitancy and frequency. Patient had a foley catheter placed with recorded 1,150 out, but likely even more as his foley catheter bag  is full. The primary team has consulted Urology. We recommend keeping the foley in place, continuing IVF, treating underlying causes of sepsis and BPH, and continuing to monitor renal function for improvement. Patient will need to avoid NSAIDs and any other nephrotoxic substances. No indication for HD at this time. -NS 75 cc/hr continuous -Keep foley -Agree with urology consult -Repeat Renal Function Panel tomorrow morning  Bilateral Hydronephrosis: Likely secondary to BPH. Urology has been consulted. We recommend keeping foley in place for improvement in renal function.  -Foley -Urology consulted  Sepsis: Secondary to UTI. Patient is on Zosyn.  -Per primary  Osa Craver, DO PGY-2 Internal Medicine Resident Pager # [phone] 04/15/2015 1:59 PM

## 2015-04-15 NOTE — Progress Notes (Signed)
In room to insert Foley catheter. The tech, Jeani Hawking,  asked the patient if he would like his glasses and while picking them up to offer them she noticed that they were broken. The rim was cracked and the lens fell out.. She informed the patient of this and asked him if they were broken prior to admission. He stated that they were not.

## 2015-04-15 NOTE — Progress Notes (Signed)
PROGRESS NOTE  [name] Grip. TD:[id number] DOB: 22-Jan-1941 DOA: 04/14/2015 PCP: No primary care provider on file.  Brief history 74 year old male with a history hypertension presented with 2-3 week history of generalized weakness and malaise. The patient has not seen a physician for nearly 3 years. He has been taking Goody's daily for chronic daily headache. In the past 2 days prior to admission, the patient continued to feel worse with subjective fevers and chills and worsening malaise and fatigue. He denied any chest discomfort, shortness breath, coughing, hemoptysis, abdominal pain, dysuria. However he had been having nausea and vomiting for 2 days. Evaluation initially revealed a temperature 102.22F, and the patient was found to have atrial fibrillation with RVR. The patient was started on a diltiazem drip and IV metoprolol.  He spontaneously converted back to sinus rhythm.  He was seen by cardiology, Dr. Terrence [name], and the pt was started on heparin drip Assessment/Plan:  sepsis  -Secondary to UTI and bacteremia  -Discontinue ceftriaxone and azithromycin  -Continue IV fluids  -Start Zosyn  -Procalcitonin 16.49  -lactic acid 1.2  bilateral hydronephrosis/urine retention -Suspect the patient has an enlarged prostate by the patient's description resulting in bladder outlet obstruction and hydronephrosis -I have consulted urology -foley catheter placed-->1200cc out Acute kidney injury -Multifactorial including sepsis, volume depletion, NSAIDS, and BOO -After Foley catheter was inserted, the patient put out 1200 mL urine -Renal ultrasound showed bilateral hydronephrosis -Continue IV fluids -I have consulted nephrology Atrial fibrillation with RVR -Echocardiogram -Spontaneously converted to sinus rhythm -diltiazem drip and heparin drip per cardiology -continue metoprolol tartrate -amiodarone per cardiology Elevated troponin -due to demand ischemia from sepsis, AKI, and  Afib -cardiology following Anemia of chronic disease  -Iron saturation 3%  -We'll need to replete iron stores  [id number]  -Folic acid 7.1    Family Communication:   Wife update at beside Disposition Plan:  Remain in stepdown today      Procedures/Studies: US Abdomen Complete  04/15/2015   CLINICAL DATA:  74 year old hypertensive male with abdominal pain, nausea, vomiting diarrhea with loss of appetite worse over the past week. Initial encounter.  EXAM: ULTRASOUND ABDOMEN COMPLETE  COMPARISON:  None.  FINDINGS: Gallbladder: No gallstones or wall thickening visualized. No sonographic Murphy sign noted.  Common bile duct: Diameter: 4.1 mm.  Liver: Mild increased echogenicity may represent mild fatty infiltration without mass identified.  IVC: No abnormality visualized.  Pancreas: Poorly delineated secondary to bowel gas.  Spleen: Size and appearance within normal limits.  Right Kidney: Length: 14.3 cm.  Hydronephrosis.  No obvious mass.  Left Kidney: Length: 14.5 cm.  Hydronephrosis.  No obvious mass.  Abdominal aorta: Poorly delineated secondary to bowel gas. Proximal aspect measures up to 3 cm.  Other findings: None.  IMPRESSION: Bilateral prominent hydronephrosis.  Etiology indeterminate.  Poor delineation of the pancreas and abdominal aorta secondary to bowel gas. Proximal abdominal aorta measures up to 3 cm.  Question mild fatty infiltration the liver.   Electronically Signed   By: Frank Del M.D.   On: 04/15/2015 07:21   US Pelvis Limited  04/15/2015   CLINICAL DATA:  Acute urine retention.  EXAM: LIMITED ULTRASOUND OF PELVIS  TECHNIQUE: Limited transabdominal ultrasound examination of the pelvis was performed.  COMPARISON:  None.  FINDINGS: The urinary bladder has a normal appearance for the degree of distention.  Pre-void volume:  814.8 ml  Post-void volume:  Patient unable to void.  Other findings:  Mild echogenic debris present within the bladder with mild patchy wall irregularity over  the left lateral aspect and dependent portion.  IMPRESSION: Bladder volume 814.8 mL as patient unable to void. Mild internal echogenic debris within the bladder as well as patchy wall irregularity along the left lateral aspect and dependent portion which may be due to infection/inflammatory process or hemorrhagic debris and less likely neoplastic process.   Electronically Signed   By: Frank Olp M.D.   On: 04/15/2015 08:22   Dg Chest Portable 1 View  04/14/2015   CLINICAL DATA:  74 year old male with nausea, vomiting and diarrhea gradually worsening over the past week. Atrial fibrillation.  EXAM: PORTABLE CHEST - 1 VIEW  COMPARISON:  No priors.  FINDINGS: Lung volumes are low. Ill-defined opacity at the left lung base obscuring the left hemidiaphragm. No definite pleural effusions. Heart size is upper limits of normal. The patient is rotated to the left on today's exam, resulting in distortion of the mediastinal contours and reduced diagnostic sensitivity and specificity for mediastinal pathology.  IMPRESSION: 1. Left lower lobe opacity concerning for pneumonia. Followup PA and lateral chest X-ray is recommended in 3-4 weeks following trial of antibiotic therapy to ensure resolution and exclude underlying malignancy.   Electronically Signed   By: Frank Langton M.D.   On: 04/14/2015 16:41         Subjective: Patient denies fevers, chills, headache, chest pain, dyspnea, nausea, vomiting, diarrhea, abdominal pain, dysuria, hematuria   Objective: Filed Vitals:   04/15/15 0400 04/15/15 0500 04/15/15 0600 04/15/15 0752  BP: 131/68 110/64 124/67 115/65  Pulse: 66  65 64  Temp:  102.4 F (39.1 C)  97.8 F (36.6 C)  TempSrc:  Rectal  Oral  Resp: 24 26 28 25   Height:      Weight:  90.8 kg (200 lb 2.8 oz)    SpO2: 98% 96% 99% 99%    Intake/Output Summary (Last 24 hours) at 04/15/15 0858 Last data filed at 04/15/15 0700  Gross per 24 hour  Intake 413.96 ml  Output      0 ml  Net 413.96  ml   Weight change:  Exam:   General:  Pt is alert, follows commands appropriately, not in acute distress  HEENT: No icterus, No thrush, No neck mass, [place]/AT  Cardiovascular: RRR, S1/S2, no rubs, no gallops  Respiratory: CTA bilaterally, no wheezing, no crackles, no rhonchi  Abdomen: Soft/+BS, non tender, non distended, no guarding; no hepatosplenomegaly   Extremities: No edema, No lymphangitis, No petechiae, No rashes, no synovitis; no cyanosis or clubbing   Data Reviewed: Basic Metabolic Panel:  Recent Labs Lab 04/14/15 1511 04/14/15 2218 04/15/15 0355  NA 136  --  132*  K 4.7  --  4.4  CL 100*  --  100*  CO2 21*  --  19*  GLUCOSE 142*  --  141*  BUN 73*  --  84*  CREATININE 5.33*  --  6.29*  CALCIUM 9.5  --  8.4*  PHOS  --  4.2  --    Liver Function Tests:  Recent Labs Lab 04/14/15 1511 04/15/15 0355  AST 22 17  ALT 11* 13*  ALKPHOS 59 53  BILITOT 0.7 0.5  PROT 8.0 6.2*  ALBUMIN 3.9 3.0*   No results for input(s): LIPASE, AMYLASE in the last 168 hours. No results for input(s): AMMONIA in the last 168 hours. CBC:  Recent Labs Lab 04/14/15 1511 04/15/15 0355  WBC 12.8* 7.4  NEUTROABS  --  6.8  HGB 10.6* 9.0*  HCT 31.3* 25.7*  MCV 95.4 92.8  PLT 177 122*   Cardiac Enzymes:  Recent Labs Lab 04/14/15 1511 04/14/15 2218 04/15/15 0355  TROPONINI 0.16* 0.19* 0.18*   BNP: Invalid input(s): POCBNP CBG: No results for input(s): GLUCAP in the last 168 hours.  Recent Results (from the past 240 hour(s))  Blood culture (routine x 2)     Status: None (Preliminary result)   Collection Time: 04/14/15  5:15 PM  Result Value Ref Range Status   Specimen Description BLOOD LEFT ANTECUBITAL  Final   Special Requests BOTTLES DRAWN AEROBIC AND ANAEROBIC 5CC EACH  Final   Culture PENDING  Incomplete   Report Status PENDING  Incomplete  MRSA PCR Screening     Status: None   Collection Time: 04/14/15  9:50 PM  Result Value Ref Range Status   MRSA by PCR  NEGATIVE NEGATIVE Final    Comment:        The GeneXpert MRSA Assay (FDA approved for NASAL specimens only), is one component of a comprehensive MRSA colonization surveillance program. It is not intended to diagnose MRSA infection nor to guide or monitor treatment for MRSA infections.      Scheduled Meds: . amiodarone  200 mg Oral BID  . antiseptic oral rinse  7 mL Mouth Rinse BID  . aspirin  81 mg Oral Daily  . azithromycin  500 mg Intravenous Once  . cefTRIAXone (ROCEPHIN)  IV  1 g Intravenous Q24H  . metoprolol tartrate  25 mg Oral BID  . pneumococcal 23 valent vaccine  0.5 mL Intramuscular Tomorrow-1000  . sodium chloride  3 mL Intravenous Q12H   Continuous Infusions: . diltiazem (CARDIZEM) infusion 5 mg/hr (04/15/15 0526)  . heparin 1,350 Units/hr (04/15/15 0525)     Arelene Moroni, DO  [facility] Pager [phone]  If 7PM-7AM, please contact night-coverage [email or web address] Password [email or web address] 04/15/2015, 8:58 AM   LOS: 1 day
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
## 2015-04-15 NOTE — Progress Notes (Signed)
Per doctor, no action necessary at this time. Advises  that if patient heart rate is sustained in the 120's to 130's to notify him.

## 2015-04-15 NOTE — Progress Notes (Signed)
Noted HR of 120-130 afib rate. Baltazar Najjar, NP paged and order for EKG.

## 2015-04-15 NOTE — Progress Notes (Addendum)
RN, Hoyle Sauer, paged this NP because pt's HR was sustaining 120-140 range in what appears to be Afib on tele. Pt has known hx Afib and is followed by Cardiology here. Today, his rate was controlled and Cardizem was stopped. Heparin gtt still infusion per RN. BP 140s. Pt is asymptomatic. 12 lead EKG shows Aflutter. Will try IV dose Metoprolol first and follow rate. Is on Metoprolol and Amiodorone po due at 10pm. If rate stays uncontrolled, will need to restart drip. Continue Heparin in face of arrhythmia.  Pt now with fever which could be causing rate increase. Give a bolus, Tylenol. Already cultured and is on abx for gm neg rods.  Clance Boll, NP [facility] AddendumMartin Majestic to bedside to check on pt. HR 110, resting quietly. Also received po Metoprolol and Amiodarone dose at hs.  KJKG, NP [facility]

## 2015-04-15 NOTE — Consult Note (Signed)
ANTICOAGULATION CONSULT NOTE  Pharmacy Consult for Heparin Indication: atrial fibrillation  No Known Allergies  Patient Measurements: Height: 5\' 4"  (162.6 cm) Weight: 198 lb 6.6 oz (90 kg) IBW/kg (Calculated) : 59.2 Heparin Dosing Weight: 78.5kg  Vital Signs: Temp: 99.4 F (37.4 C) (07/26 0000) Temp Source: Axillary (07/26 0000) BP: 121/66 mmHg (07/26 0100) Pulse Rate: 71 (07/26 0100)  Labs:  Recent Labs  04/14/15 1511 04/14/15 2218 04/15/15 0355  HGB 10.6*  --  9.0*  HCT 31.3*  --  25.7*  PLT 177  --  122*  APTT  --  59*  --   LABPROT  --  18.9*  --   INR  --  1.58*  --   HEPARINUNFRC  --   --  0.16*  CREATININE 5.33*  --   --   TROPONINI 0.16* 0.19*  --     Estimated Creatinine Clearance: 12.9 mL/min (by C-G formula based on Cr of 5.33).  Assessment: 74 y.o. male with Afib for heparin  Goal of Therapy:  Heparin level 0.3-0.7 units/ml Monitor platelets by anticoagulation protocol: Yes   Plan:  Heparin 2000 units IV bolus, then increase heparin 1350 units/hr Check heparin level in 8 hours.   [name], [name] 04/15/2015,4:32 AM

## 2015-04-16 LAB — RENAL FUNCTION PANEL
ALBUMIN: 2.8 g/dL — AB (ref 3.5–5.0)
Anion gap: 15 (ref 5–15)
BUN: 94 mg/dL — ABNORMAL HIGH (ref 6–20)
CALCIUM: 8.3 mg/dL — AB (ref 8.9–10.3)
CO2: 16 mmol/L — AB (ref 22–32)
Chloride: 106 mmol/L (ref 101–111)
Creatinine, Ser: 6.45 mg/dL — ABNORMAL HIGH (ref 0.61–1.24)
GFR, EST AFRICAN AMERICAN: 9 mL/min — AB (ref >60)
GFR, EST NON AFRICAN AMERICAN: 8 mL/min — AB (ref >60)
Glucose, Bld: 118 mg/dL — ABNORMAL HIGH (ref 65–99)
PHOSPHORUS: 6.7 mg/dL — AB (ref 2.5–4.6)
Potassium: 4 mmol/L (ref 3.5–5.1)
Sodium: 137 mmol/L (ref 135–145)

## 2015-04-16 LAB — PROTEIN ELECTROPHORESIS, SERUM
A/G RATIO SPE: 1.1 (ref 0.7–1.7)
ALPHA-1-GLOBULIN: 0.4 g/dL (ref 0.0–0.4)
ALPHA-2-GLOBULIN: 0.9 g/dL (ref 0.4–1.0)
Albumin ELP: 3.1 g/dL (ref 2.9–4.4)
BETA GLOBULIN: 0.8 g/dL (ref 0.7–1.3)
Gamma Globulin: 0.6 g/dL (ref 0.4–1.8)
Globulin, Total: 2.8 g/dL (ref 2.2–3.9)
Total Protein ELP: 5.9 g/dL — ABNORMAL LOW (ref 6.0–8.5)

## 2015-04-16 LAB — UIFE/LIGHT CHAINS/TP QN, 24-HR UR
% BETA, Urine: 23.7 %
ALPHA 1 URINE: 2.9 %
ALPHA 2 UR: 17.9 %
Albumin, U: 46.6 %
Free Kappa/Lambda Ratio: 6.33 (ref 2.04–10.37)
Free Lambda Lt Chains,Ur: 20.7 mg/L — ABNORMAL HIGH (ref 0.24–6.66)
Free Lt Chn Excr Rate: 131 mg/L — ABNORMAL HIGH (ref 1.35–24.19)
GAMMA GLOBULIN URINE: 8.9 %
Total Protein, Urine: 76.6 mg/dL

## 2015-04-16 LAB — LEGIONELLA ANTIGEN, URINE

## 2015-04-16 LAB — CBC
HEMATOCRIT: 26.3 % — AB (ref 39.0–52.0)
Hemoglobin: 9 g/dL — ABNORMAL LOW (ref 13.0–17.0)
MCH: 32 pg (ref 26.0–34.0)
MCHC: 34.2 g/dL (ref 30.0–36.0)
MCV: 93.6 fL (ref 78.0–100.0)
PLATELETS: 129 10*3/uL — AB (ref 150–400)
RBC: 2.81 MIL/uL — ABNORMAL LOW (ref 4.22–5.81)
RDW: 13.3 % (ref 11.5–15.5)
WBC: 6 10*3/uL (ref 4.0–10.5)

## 2015-04-16 LAB — HEPARIN LEVEL (UNFRACTIONATED)
HEPARIN UNFRACTIONATED: 0.26 [IU]/mL — AB (ref 0.30–0.70)
HEPARIN UNFRACTIONATED: 0.36 [IU]/mL (ref 0.30–0.70)
Heparin Unfractionated: 0.25 IU/mL — ABNORMAL LOW (ref 0.30–0.70)

## 2015-04-16 MED ORDER — HEPARIN BOLUS VIA INFUSION
1200.0000 [IU] | Freq: Once | INTRAVENOUS | Status: AC
  Administered 2015-04-16: 1200 [IU] via INTRAVENOUS
  Filled 2015-04-16: qty 1200

## 2015-04-16 MED ORDER — METOPROLOL TARTRATE 50 MG PO TABS
50.0000 mg | ORAL_TABLET | Freq: Two times a day (BID) | ORAL | Status: DC
  Administered 2015-04-16 – 2015-04-18 (×4): 50 mg via ORAL
  Filled 2015-04-16 (×5): qty 1

## 2015-04-16 MED ORDER — ENSURE ENLIVE PO LIQD
237.0000 mL | Freq: Two times a day (BID) | ORAL | Status: DC
  Administered 2015-04-16 – 2015-04-23 (×12): 237 mL via ORAL

## 2015-04-16 NOTE — Progress Notes (Signed)
Initial Nutrition Assessment  DOCUMENTATION CODES:   Obesity unspecified  INTERVENTION:   Ensure Enlive po BID, each supplement provides 350 kcal and 20 grams of protein  NUTRITION DIAGNOSIS:   Increased nutrient needs related to acute illness as evidenced by estimated needs  GOAL:   Patient will meet greater than or equal to 90% of their needs  MONITOR:   PO intake, Supplement acceptance, Labs, Weight trends, I & O's  REASON FOR ASSESSMENT:   Malnutrition Screening Tool  ASSESSMENT:   74 y.o. Male with history of HTN who has not been to a physician for 2-3 years; was brought to the ER after patient was found increasingly weak and fatigued; chest X-ray showed pneumonic process.  RD unable to obtain nutrition hx or complete Nutrition Focused Physical Exam at this time.  Per H&P, pt with decreased appetite along with some nausea.  PO intake variable at 10-75% per flowsheet records.  Nutrient needs increased given acute illness.  Would benefit from addition of oral nutrition supplements.  RD to order.  Diet Order:  Diet renal with fluid restriction Fluid restriction:: 1200 mL Fluid; Room service appropriate?: Yes; Fluid consistency:: Thin  Skin:  Reviewed, no issues  Last BM:  Unknown  Height:   Ht Readings from Last 1 Encounters:  04/14/15 5\' 4"  (1.626 m)    Weight:   Wt Readings from Last 1 Encounters:  04/16/15 197 lb 1.5 oz (89.4 kg)    Ideal Body Weight:  59 kg  BMI:  Body mass index is 33.81 kg/(m^2).  Estimated Nutritional Needs:   Kcal:  1900-2100  Protein:  90-100 gm  Fluid:  1200 ml  EDUCATION NEEDS:   No education needs identified at this time  Arthur Holms, RD, LDN Pager #: [phone] After-Hours Pager #: [phone]

## 2015-04-16 NOTE — Progress Notes (Signed)
Subjective: [name] is a 74 yo male admitted with acute renal failure likely secondary to Bladder Outlet Obstruction 2/2 BPH. Patient was seen and examined this morning. He denies any complaints, no shortness of breath.   Objective: Filed Vitals:   04/16/15 0454 04/16/15 0500 04/16/15 0750 04/16/15 0929  BP: 100/56  130/90 131/86  Pulse: 123   111  Temp: 99.2 F (37.3 C)  98.2 F (36.8 C)   TempSrc: Axillary  Oral   Resp: 21  26   Height:      Weight:  197 lb 1.5 oz (89.4 kg)    SpO2: 98%  97%    General: Vital signs reviewed. Patient is elderly male, in no acute distress and cooperative with exam.  Cardiovascular: RRR, S1 normal, S2 normal, no murmurs, gallops, or rubs. Pulmonary/Chest: Clear to auscultation bilaterally, no wheezes, rales, or rhonchi. Abdominal: Soft, non-tender, non-distended, BS + GU: Foley in place with pink cloudy urine Extremities: No lower extremity edema bilaterally. Neurological: Alert, appropriate  Skin: Warm, dry and intact. No rashes or erythema.  Intake/Output from previous day: 07/26 0701 - 07/27 0700 In: 2071.2 [P.O.:240; I.V.:1681.2; IV Piggyback:150] Out: [id number] [Urine:4350] Intake/Output this shift: Total I/O In: 411 [P.O.:360; I.V.:51] Out: 900 [Urine:900]  Lab Results:  Recent Labs  04/15/15 0355 04/16/15 0249  WBC 7.4 6.0  HGB 9.0* 9.0*  HCT 25.7* 26.3*  PLT 122* 129*   BMET:   Recent Labs  04/15/15 0355 04/16/15 0249  NA 132* 137  K 4.4 4.0  CL 100* 106  CO2 19* 16*  GLUCOSE 141* 118*  BUN 84* 94*  CREATININE 6.29* 6.45*  CALCIUM 8.4* 8.3*   Iron Studies:   Recent Labs  04/14/15 12/14/16  IRON 6*  TIBC 231*  FERRITIN 327   Studies/Results: US Abdomen Complete  04/15/2015   CLINICAL DATA:  74 year old hypertensive male with abdominal pain, nausea, vomiting diarrhea with loss of appetite worse over the past week. Initial encounter.  EXAM: ULTRASOUND ABDOMEN COMPLETE  COMPARISON:  None.  FINDINGS: Gallbladder:  No gallstones or wall thickening visualized. No sonographic Murphy sign noted.  Common bile duct: Diameter: 4.1 mm.  Liver: Mild increased echogenicity may represent mild fatty infiltration without mass identified.  IVC: No abnormality visualized.  Pancreas: Poorly delineated secondary to bowel gas.  Spleen: Size and appearance within normal limits.  Right Kidney: Length: 14.3 cm.  Hydronephrosis.  No obvious mass.  Left Kidney: Length: 14.5 cm.  Hydronephrosis.  No obvious mass.  Abdominal aorta: Poorly delineated secondary to bowel gas. Proximal aspect measures up to 3 cm.  Other findings: None.  IMPRESSION: Bilateral prominent hydronephrosis.  Etiology indeterminate.  Poor delineation of the pancreas and abdominal aorta secondary to bowel gas. Proximal abdominal aorta measures up to 3 cm.  Question mild fatty infiltration the liver.   Electronically Signed   By: Genia Del M.D.   On: 04/15/2015 07:21   US Pelvis Limited  04/15/2015   CLINICAL DATA:  Acute urine retention.  EXAM: LIMITED ULTRASOUND OF PELVIS  TECHNIQUE: Limited transabdominal ultrasound examination of the pelvis was performed.  COMPARISON:  None.  FINDINGS: The urinary bladder has a normal appearance for the degree of distention.  Pre-void volume:  814.8 ml  Post-void volume:  Patient unable to void.  Other findings: Mild echogenic debris present within the bladder with mild patchy wall irregularity over the left lateral aspect and dependent portion.  IMPRESSION: Bladder volume 814.8 mL as patient unable to void. Mild internal echogenic  debris within the bladder as well as patchy wall irregularity along the left lateral aspect and dependent portion which may be due to infection/inflammatory process or hemorrhagic debris and less likely neoplastic process.   Electronically Signed   By: Marin Olp M.D.   On: 04/15/2015 08:22   Dg Chest Portable 1 View  04/14/2015   CLINICAL DATA:  74 year old male with nausea, vomiting and diarrhea  gradually worsening over the past week. Atrial fibrillation.  EXAM: PORTABLE CHEST - 1 VIEW  COMPARISON:  No priors.  FINDINGS: Lung volumes are low. Ill-defined opacity at the left lung base obscuring the left hemidiaphragm. No definite pleural effusions. Heart size is upper limits of normal. The patient is rotated to the left on today's exam, resulting in distortion of the mediastinal contours and reduced diagnostic sensitivity and specificity for mediastinal pathology.  IMPRESSION: 1. Left lower lobe opacity concerning for pneumonia. Followup PA and lateral chest X-ray is recommended in 3-4 weeks following trial of antibiotic therapy to ensure resolution and exclude underlying malignancy.   Electronically Signed   By: Vinnie Langton M.D.   On: 04/14/2015 16:41    I have reviewed the patient's current medications. Prior to Admission:  Prescriptions prior to admission  Medication Sig Dispense Refill Last Dose  . aspirin 325 MG tablet Take 325 mg by mouth every 4 (four) hours as needed for mild pain, moderate pain or headache.   over 30 days  . Aspirin-Acetaminophen (GOODY BODY PAIN) 500-325 MG PACK Take 1 Package by mouth daily as needed (pain, headaches).   04/14/2015 at 1200   Scheduled: . amiodarone  200 mg Oral BID  . antiseptic oral rinse  7 mL Mouth Rinse BID  . aspirin  81 mg Oral Daily  . metoprolol tartrate  25 mg Oral BID  . piperacillin-tazobactam (ZOSYN)  IV  2.25 g Intravenous 3 times per day  . sodium chloride  3 mL Intravenous Q12H   Continuous: . heparin 1,600 Units/hr (04/16/15 0533)  . sodium chloride 0.9 % 1,000 mL infusion 75 mL/hr at 04/15/15 1215   [id number] **OR** acetaminophen, ondansetron **OR** ondansetron (ZOFRAN) IV  Assessment/Plan: Acute Renal Failure: Likely secondary to bladder outlet obstruction from BPH. Creatinine has trended 5.33>6.29>6.45 this morning. Unknow baseline. Good UOP of -[id number] yesterday. Increase in creatinine may be due to atrial  fibrillation with RVR which returned overnight or due to low BP of 100/56 this morning. With control of RVR and continued improvement of blood pressure, we expect renal function to improve. We will continue to monitor. -NS 75 cc/hr continuous -Keep foley in place -Repeat Renal Function Panel tomorrow morning  Bilateral Hydronephrosis: Likely secondary to BPH. Urology has been consulted who recommend keeping foley in after d/c until patient can be seen at follow up. They also recommend starting flomax 0.4 mg at discharge. -Keep foley  Sepsis: Secondary to UTI. Patient is on Zosyn.  -Per primary   LOS: 2 days   Osa Craver, DO PGY-2 Internal Medicine Resident Pager # [phone] 04/16/2015 10:56 AM

## 2015-04-16 NOTE — Progress Notes (Signed)
Subjective:  Patient denies any chest pain or shortness of breath converted back into atrial flutter with variable block.  Objective:  Vital Signs in the last 24 hours: Temp:  [98 F (36.7 C)-101.7 F (38.7 C)] 98.9 F (37.2 C) (07/27 1156) Pulse Rate:  [87-126] 113 (07/27 1156) Resp:  [19-31] 24 (07/27 1156) BP: (99-153)/(56-90) 99/60 mmHg (07/27 1156) SpO2:  [31 %-100 %] 97 % (07/27 0750) Weight:  [89.4 kg (197 lb 1.5 oz)] 89.4 kg (197 lb 1.5 oz) (07/27 0500)  Intake/Output from previous day: 07/26 0701 - 07/27 0700 In: 2071.2 [P.O.:240; I.V.:1681.2; IV Piggyback:150] Out: 4350 [Urine:4350] Intake/Output from this shift: Total I/O In: 411 [P.O.:360; I.V.:51] Out: 900 [Urine:900]  Physical Exam: General appearance: alert and slowed mentation Neck: no adenopathy, no carotid bruit, no JVD and supple, symmetrical, trachea midline Lungs: clear to auscultation bilaterally Heart: regularly irregular rhythm, S1, S2 normal and Soft systolic murmur noted Abdomen: soft, non-tender; bowel sounds normal; no masses,  no organomegaly Extremities: extremities normal, atraumatic, no cyanosis or edema  Lab Results:  Recent Labs  04/15/15 0355 04/16/15 0249  WBC 7.4 6.0  HGB 9.0* 9.0*  PLT 122* 129*    Recent Labs  04/15/15 0355 04/16/15 0249  NA 132* 137  K 4.4 4.0  CL 100* 106  CO2 19* 16*  GLUCOSE 141* 118*  BUN 84* 94*  CREATININE 6.29* 6.45*    Recent Labs  04/15/15 0355 04/15/15 0921  TROPONINI 0.18* 0.14*   Hepatic Function Panel  Recent Labs  04/15/15 0355 04/16/15 0249  PROT 6.2*  --   ALBUMIN 3.0* 2.8*  AST 17  --   ALT 13*  --   ALKPHOS 53  --   BILITOT 0.5  --     Recent Labs  04/15/15 0355  CHOL 136   No results for input(s): PROTIME in the last 72 hours.  Imaging: Imaging results have been reviewed and US Abdomen Complete  04/15/2015   CLINICAL DATA:  74 year old hypertensive male with abdominal pain, nausea, vomiting diarrhea with  loss of appetite worse over the past week. Initial encounter.  EXAM: ULTRASOUND ABDOMEN COMPLETE  COMPARISON:  None.  FINDINGS: Gallbladder: No gallstones or wall thickening visualized. No sonographic Murphy sign noted.  Common bile duct: Diameter: 4.1 mm.  Liver: Mild increased echogenicity may represent mild fatty infiltration without mass identified.  IVC: No abnormality visualized.  Pancreas: Poorly delineated secondary to bowel gas.  Spleen: Size and appearance within normal limits.  Right Kidney: Length: 14.3 cm.  Hydronephrosis.  No obvious mass.  Left Kidney: Length: 14.5 cm.  Hydronephrosis.  No obvious mass.  Abdominal aorta: Poorly delineated secondary to bowel gas. Proximal aspect measures up to 3 cm.  Other findings: None.  IMPRESSION: Bilateral prominent hydronephrosis.  Etiology indeterminate.  Poor delineation of the pancreas and abdominal aorta secondary to bowel gas. Proximal abdominal aorta measures up to 3 cm.  Question mild fatty infiltration the liver.   Electronically Signed   By: Genia Del M.D.   On: 04/15/2015 07:21   US Pelvis Limited  04/15/2015   CLINICAL DATA:  Acute urine retention.  EXAM: LIMITED ULTRASOUND OF PELVIS  TECHNIQUE: Limited transabdominal ultrasound examination of the pelvis was performed.  COMPARISON:  None.  FINDINGS: The urinary bladder has a normal appearance for the degree of distention.  Pre-void volume:  814.8 ml  Post-void volume:  Patient unable to void.  Other findings: Mild echogenic debris present within the bladder with mild patchy wall  irregularity over the left lateral aspect and dependent portion.  IMPRESSION: Bladder volume 814.8 mL as patient unable to void. Mild internal echogenic debris within the bladder as well as patchy wall irregularity along the left lateral aspect and dependent portion which may be due to infection/inflammatory process or hemorrhagic debris and less likely neoplastic process.   Electronically Signed   By: Marin Olp M.D.    On: 04/15/2015 08:22   Dg Chest Portable 1 View  04/14/2015   CLINICAL DATA:  74 year old male with nausea, vomiting and diarrhea gradually worsening over the past week. Atrial fibrillation.  EXAM: PORTABLE CHEST - 1 VIEW  COMPARISON:  No priors.  FINDINGS: Lung volumes are low. Ill-defined opacity at the left lung base obscuring the left hemidiaphragm. No definite pleural effusions. Heart size is upper limits of normal. The patient is rotated to the left on today's exam, resulting in distortion of the mediastinal contours and reduced diagnostic sensitivity and specificity for mediastinal pathology.  IMPRESSION: 1. Left lower lobe opacity concerning for pneumonia. Followup PA and lateral chest X-ray is recommended in 3-4 weeks following trial of antibiotic therapy to ensure resolution and exclude underlying malignancy.   Electronically Signed   By: Vinnie Langton M.D.   On: 04/14/2015 16:41    Cardiac Studies:  Assessment/Plan:  Recurrent A. fib /atrial flutter with RVR chads Vas score of 3 Probable very small type II MI secondary to demand ischemia/abnormal EKG rule out coronary insufficiency Hypertension New-onset diabetes Hypercholesteremia Community acquired left pneumonia Probable acute on chronic kidney injury secondary to obstructive uropathy Dehydration Anemia of chronic disease Status post rectal bleeding Plan Agree with hydration Increase Lopressor as per orders Patient not distended for flutter ablation at this time due to multiple comorbidities and sepsis.    LOS: 2 days    Charolette Forward 04/16/2015, 12:56 PM

## 2015-04-16 NOTE — Progress Notes (Signed)
[facility] PROGRESS NOTE  [name] [id number] DOB: [birth year] DOA: 04/14/2015  PCP: No primary care provider on file.  Brief HPI: 74 year old Caucasian male with a past medical history of hypertension who denies taking any medications at home, presented with complaints of weakness and fatigue. He was found to be in atrial fibrillation with RVR. He had a temperature of 102F, likely secondary to pneumonia. He also was noted to be in renal failure. Patient was hospitalized for further management.  Past medical history:  Past Medical History  Diagnosis Date  . Hypertension     Consultants: Cardiology, Dr. Terrence Dupont. Nephrology  Procedures:  2-D echocardiogram Study Conclusions - Left ventricle: The cavity size was normal. Wall thickness wasincreased in a pattern of mild LVH. Systolic function was normal.The estimated ejection fraction was in the range of 55% to 60%.Wall motion was normal; there were no regional wall motionabnormalities. Left ventricular diastolic function parameterswere normal. - Aortic valve: There was trivial regurgitation. - Atrial septum: No defect or patent foramen ovale was identified.  Antibiotics: Zosyn  Subjective: Patient denies any chest pain. He does admit to some shortness of breath. Denies any cough. No nausea, vomiting or abdominal pain.  Objective: Vital Signs  Filed Vitals:   04/15/15 2240 04/15/15 2346 04/16/15 0454 04/16/15 0500  BP: 130/84 115/68 100/56   Pulse: 115 87 123   Temp:  99.6 F (37.6 C) 99.2 F (37.3 C)   TempSrc:  Axillary Axillary   Resp:  19 21   Height:      Weight:    89.4 kg (197 lb 1.5 oz)  SpO2:  100% 98%     Intake/Output Summary (Last 24 hours) at 04/16/15 0739 Last data filed at 04/16/15 0600  Gross per 24 hour  Intake 2055.15 ml  Output   4350 ml  Net -2294.85 ml   Filed Weights   04/14/15 2030 04/15/15 0500 04/16/15 0500  Weight: 90 kg (198 lb 6.6 oz) 90.8 kg (200 lb 2.8 oz)  89.4 kg (197 lb 1.5 oz)    General appearance: alert, cooperative, appears stated age and no distress Resp: Diminished air entry at the bases without any crackles or wheezing. Cardio: His minister is tachycardic, irregularly irregular. No S3, S4. No rubs, murmurs, or bruit. No pedal edema. GI: soft, non-tender; bowel sounds normal; no masses,  no organomegaly Extremities: extremities normal, atraumatic, no cyanosis or edema Neurologic: No focal deficits.  Lab Results:  Basic Metabolic Panel:  Recent Labs Lab 04/14/15 1511 04/14/15 2218 04/15/15 0355 04/16/15 0249  NA 136  --  132* 137  K 4.7  --  4.4 4.0  CL 100*  --  100* 106  CO2 21*  --  19* 16*  GLUCOSE 142*  --  141* 118*  BUN 73*  --  84* 94*  CREATININE 5.33*  --  6.29* 6.45*  CALCIUM 9.5  --  8.4* 8.3*  PHOS  --  4.2  --  6.7*   Liver Function Tests:  Recent Labs Lab 04/14/15 1511 04/15/15 0355 04/16/15 0249  AST 22 17  --   ALT 11* 13*  --   ALKPHOS 59 53  --   BILITOT 0.7 0.5  --   PROT 8.0 6.2*  --   ALBUMIN 3.9 3.0* 2.8*   CBC:  Recent Labs Lab 04/14/15 1511 04/15/15 0355 04/16/15 0249  WBC 12.8* 7.4 6.0  NEUTROABS  --  6.8  --   HGB 10.6* 9.0* 9.0*  HCT  31.3* 25.7* 26.3*  MCV 95.4 92.8 93.6  PLT 177 122* 129*   Cardiac Enzymes:  Recent Labs Lab 04/14/15 1511 04/14/15 2218 04/15/15 0355 04/15/15 0921  TROPONINI 0.16* 0.19* 0.18* 0.14*    Recent Results (from the past 240 hour(s))  Blood culture (routine x 2)     Status: None (Preliminary result)   Collection Time: 04/14/15  5:15 PM  Result Value Ref Range Status   Specimen Description BLOOD LEFT ANTECUBITAL  Final   Special Requests BOTTLES DRAWN AEROBIC AND ANAEROBIC 5CC EACH  Final   Culture  Setup Time   Final    GRAM NEGATIVE RODS IN BOTH AEROBIC AND ANAEROBIC BOTTLES CRITICAL RESULT CALLED TO, READ BACK BY AND VERIFIED WITH: A AGUIRRE,RN AT [id number] 04/15/15 BY L BENFIELD    Culture   Final    GRAM NEGATIVE RODS Performed  at [facility]    Report Status PENDING  Incomplete  Blood culture (routine x 2)     Status: None (Preliminary result)   Collection Time: 04/14/15  5:30 PM  Result Value Ref Range Status   Specimen Description BLOOD RIGHT ARM  Final   Special Requests BOTTLES DRAWN AEROBIC ONLY 4CC  Final   Culture  Setup Time   Final    GRAM NEGATIVE RODS AEROBIC BOTTLE ONLY CRITICAL RESULT CALLED TO, READ BACK BY AND VERIFIED WITH: C WHITE,RN AT 1055 04/15/15 BY L BENFIELD    Culture   Final    GRAM NEGATIVE RODS Performed at [facility]    Report Status PENDING  Incomplete  MRSA PCR Screening     Status: None   Collection Time: 04/14/15  9:50 PM  Result Value Ref Range Status   MRSA by PCR NEGATIVE NEGATIVE Final    Comment:        The GeneXpert MRSA Assay (FDA approved for NASAL specimens only), is one component of a comprehensive MRSA colonization surveillance program. It is not intended to diagnose MRSA infection nor to guide or monitor treatment for MRSA infections.       Studies/Results: US Abdomen Complete  04/15/2015   CLINICAL DATA:  74 year old hypertensive male with abdominal pain, nausea, vomiting diarrhea with loss of appetite worse over the past week. Initial encounter.  EXAM: ULTRASOUND ABDOMEN COMPLETE  COMPARISON:  None.  FINDINGS: Gallbladder: No gallstones or wall thickening visualized. No sonographic Murphy sign noted.  Common bile duct: Diameter: 4.1 mm.  Liver: Mild increased echogenicity may represent mild fatty infiltration without mass identified.  IVC: No abnormality visualized.  Pancreas: Poorly delineated secondary to bowel gas.  Spleen: Size and appearance within normal limits.  Right Kidney: Length: 14.3 cm.  Hydronephrosis.  No obvious mass.  Left Kidney: Length: 14.5 cm.  Hydronephrosis.  No obvious mass.  Abdominal aorta: Poorly delineated secondary to bowel gas. Proximal aspect measures up to 3 cm.  Other findings: None.  IMPRESSION: Bilateral  prominent hydronephrosis.  Etiology indeterminate.  Poor delineation of the pancreas and abdominal aorta secondary to bowel gas. Proximal abdominal aorta measures up to 3 cm.  Question mild fatty infiltration the liver.   Electronically Signed   By: Genia Del M.D.   On: 04/15/2015 07:21   US Pelvis Limited  04/15/2015   CLINICAL DATA:  Acute urine retention.  EXAM: LIMITED ULTRASOUND OF PELVIS  TECHNIQUE: Limited transabdominal ultrasound examination of the pelvis was performed.  COMPARISON:  None.  FINDINGS: The urinary bladder has a normal appearance for the degree of distention.  Pre-void volume:  814.8 ml  Post-void volume:  Patient unable to void.  Other findings: Mild echogenic debris present within the bladder with mild patchy wall irregularity over the left lateral aspect and dependent portion.  IMPRESSION: Bladder volume 814.8 mL as patient unable to void. Mild internal echogenic debris within the bladder as well as patchy wall irregularity along the left lateral aspect and dependent portion which may be due to infection/inflammatory process or hemorrhagic debris and less likely neoplastic process.   Electronically Signed   By: Marin Olp M.D.   On: 04/15/2015 08:22   Dg Chest Portable 1 View  04/14/2015   CLINICAL DATA:  74 year old male with nausea, vomiting and diarrhea gradually worsening over the past week. Atrial fibrillation.  EXAM: PORTABLE CHEST - 1 VIEW  COMPARISON:  No priors.  FINDINGS: Lung volumes are low. Ill-defined opacity at the left lung base obscuring the left hemidiaphragm. No definite pleural effusions. Heart size is upper limits of normal. The patient is rotated to the left on today's exam, resulting in distortion of the mediastinal contours and reduced diagnostic sensitivity and specificity for mediastinal pathology.  IMPRESSION: 1. Left lower lobe opacity concerning for pneumonia. Followup PA and lateral chest X-ray is recommended in 3-4 weeks following trial of  antibiotic therapy to ensure resolution and exclude underlying malignancy.   Electronically Signed   By: Vinnie Langton M.D.   On: 04/14/2015 16:41    Medications:  Scheduled: . amiodarone  200 mg Oral BID  . antiseptic oral rinse  7 mL Mouth Rinse BID  . aspirin  81 mg Oral Daily  . feeding supplement (ENSURE ENLIVE)  237 mL Oral BID BM  . heparin  1,200 Units Intravenous Once  . metoprolol tartrate  50 mg Oral BID  . piperacillin-tazobactam (ZOSYN)  IV  2.25 g Intravenous 3 times per day  . sodium chloride  3 mL Intravenous Q12H   Continuous: . heparin 1,600 Units/hr (04/16/15 0533)  . sodium chloride 0.9 % 1,000 mL infusion 75 mL/hr at 04/15/15 1215   [id number] **OR** acetaminophen, ondansetron **OR** ondansetron (ZOFRAN) IV  Assessment/Plan:  Principal Problem:   Sepsis Active Problems:   Atrial fibrillation with RVR   CAP (community acquired pneumonia)   Essential hypertension   Elevated troponin   Normocytic hypochromic anemia   CKD (chronic kidney disease) stage 4, GFR 15-29 ml/min   Gram-negative bacteremia   AKI (acute kidney injury)   Acute renal failure    Sepsis with bacteremia Source is likely UTI versus pneumonia. Patient was initially started on ceftriaxone and azithromycin. Was changed over to [facility] 7/26. Noted to have high pro-calcitonin levels. Lactic acid level was normal. Hemodynamically stable.  Acute renal failure with bilateral hydronephrosis Nephrology is following. Case has been discussed with urology as well. Continue Foley catheter. He will need Flomax at the time of discharge. Continue IV fluids per nephrology. Baseline renal function not entirely clear. Patient was taking NSAIDs at home. Monitor urine output.  Atrial fibrillation/atrial flutter with RVR Cardiology is following. He did convert to sinus rhythm briefly but then converted back to atrial flutter. Patient is on a heparin infusion. Also on Cardizem infusion and amiodarone.  Further management per cardiology.  Questionable left lower lobe pneumonia Will need follow-up chest x-ray in 4-6 weeks. Continue antibiotics.  Elevated troponin, likely secondary to demand ischemia Multifactorial due to sepsis, acute renal failure, atrial fibrillation. Continue to monitor for now. Continue aspirin. Already on beta blockers.  Anemia of chronic disease  Hemoglobin is stable. Continue to monitor  DVT Prophylaxis: On IV heparin    Code Status: DO NOT RESUSCITATE  Family Communication: Discussed with the patient  Disposition Plan: Will remain in step down for now.  Follow-up Appointment?: He will need a PCP.   LOS: 2 days   [facility] Pager [phone] 04/16/2015, 7:39 AM  If 7PM-7AM, please contact night-coverage at [email or web address], password [facility]

## 2015-04-16 NOTE — Progress Notes (Signed)
ANTICOAGULATION CONSULT NOTE - Follow Up Consult  Pharmacy Consult for heparin Indication: atrial fibrillation  No Known Allergies  Patient Measurements: Height: 5\' 4"  (162.6 cm) Weight: 197 lb 1.5 oz (89.4 kg) IBW/kg (Calculated) : 59.2 Heparin Dosing Weight: 78 kg  Vital Signs: Temp: 98.9 F (37.2 C) (07/27 1156) Temp Source: Oral (07/27 1156) BP: 99/60 mmHg (07/27 1156) Pulse Rate: 113 (07/27 1156)  Labs:  Recent Labs  04/14/15 1511 04/14/15 2218  04/15/15 0355 04/15/15 0921 04/15/15 1230 04/16/15 0249 04/16/15 1110  HGB 10.6*  --   --  9.0*  --   --  9.0*  --   HCT 31.3*  --   --  25.7*  --   --  26.3*  --   PLT 177  --   --  122*  --   --  129*  --   APTT  --  59*  --   --   --   --   --   --   LABPROT  --  18.9*  --   --   --   --   --   --   INR  --  1.58*  --   --   --   --   --   --   HEPARINUNFRC  --   --   < > 0.16*  --  0.29* 0.25* 0.26*  CREATININE 5.33*  --   --  6.29*  --   --  6.45*  --   TROPONINI 0.16* 0.19*  --  0.18* 0.14*  --   --   --   < > = values in this interval not displayed.  Estimated Creatinine Clearance: 10.6 mL/min (by C-G formula based on Cr of 6.45).   Assessment: 74yo M on heparin for new onset afib. Heparin started 7/25, pt has not been therapeutic as of 7/27. HL 0.16>0.29>0.25>0.26. H/H 9/25.7 low but stable, Plt 129 low but stable. No s/sx of bleeding noted.  Goal of Therapy:  Heparin level 0.3-0.7 units/ml Monitor platelets by anticoagulation protocol: Yes   Plan:  -Bolus heparin 1200 units -Increase heparin gtt to 1800 units/hr -F/u 2130 HL  -Daily HL/CBC -Monitor for s/sx of bleeding.   Dimitri Ped, PharmD. Clinical Pharmacist Resident Pager: [phone]

## 2015-04-16 NOTE — Consult Note (Signed)
ANTICOAGULATION CONSULT NOTE  Pharmacy Consult for Heparin Indication: atrial fibrillation  No Known Allergies  Patient Measurements: Height: 5\' 4"  (162.6 cm) Weight: 200 lb 2.8 oz (90.8 kg) IBW/kg (Calculated) : 59.2 Heparin Dosing Weight: 78.5kg  Vital Signs: Temp: 99.6 F (37.6 C) (07/26 2346) Temp Source: Axillary (07/26 2346) BP: 115/68 mmHg (07/26 2346) Pulse Rate: 87 (07/26 2346)  Labs:  Recent Labs  04/14/15 1511 04/14/15 2218 04/15/15 0355 04/15/15 0921 04/15/15 1230 04/16/15 0249  HGB 10.6*  --  9.0*  --   --  9.0*  HCT 31.3*  --  25.7*  --   --  26.3*  PLT 177  --  122*  --   --  129*  APTT  --  59*  --   --   --   --   LABPROT  --  18.9*  --   --   --   --   INR  --  1.58*  --   --   --   --   HEPARINUNFRC  --   --  0.16*  --  0.29* 0.25*  CREATININE 5.33*  --  6.29*  --   --  6.45*  TROPONINI 0.16* 0.19* 0.18* 0.14*  --   --     Estimated Creatinine Clearance: 10.7 mL/min (by C-G formula based on Cr of 6.45).  Assessment: 74 y.o. male with Afib for heparin  Goal of Therapy:  Heparin level 0.3-0.7 units/ml Monitor platelets by anticoagulation protocol: Yes   Plan:  Increase Heparin 1600 units/hr   Caryl Pina 04/16/2015,4:01 AM

## 2015-04-16 NOTE — Progress Notes (Signed)
ANTICOAGULATION CONSULT NOTE - Follow Up Consult  Pharmacy Consult for heparin Indication: atrial fibrillation  No Known Allergies  Patient Measurements: Height: 5\' 4"  (162.6 cm) Weight: 197 lb 1.5 oz (89.4 kg) IBW/kg (Calculated) : 59.2 Heparin Dosing Weight: 78 kg  Vital Signs: Temp: 99.1 F (37.3 C) (07/27 1900) Temp Source: Oral (07/27 1900) BP: 115/94 mmHg (07/27 1900) Pulse Rate: 116 (07/27 1554)  Labs:  Recent Labs  04/14/15 1511 04/14/15 2218 04/15/15 0355 04/15/15 0921  04/16/15 0249 04/16/15 1110 04/16/15 2204  HGB 10.6*  --  9.0*  --   --  9.0*  --   --   HCT 31.3*  --  25.7*  --   --  26.3*  --   --   PLT 177  --  122*  --   --  129*  --   --   APTT  --  59*  --   --   --   --   --   --   LABPROT  --  18.9*  --   --   --   --   --   --   INR  --  1.58*  --   --   --   --   --   --   HEPARINUNFRC  --   --  0.16*  --   < > 0.25* 0.26* 0.36  CREATININE 5.33*  --  6.29*  --   --  6.45*  --   --   TROPONINI 0.16* 0.19* 0.18* 0.14*  --   --   --   --   < > = values in this interval not displayed.  Estimated Creatinine Clearance: 10.6 mL/min (by C-G formula based on Cr of 6.45).   Assessment: 74yo M on heparin for new onset afib. Heparin started 7/25. Patient's first therapeutic level is this evening- 0.36 units/mL on 1800 units/hr. No bleeding noted.  Goal of Therapy:  Heparin level 0.3-0.7 units/ml Monitor platelets by anticoagulation protocol: Yes   Plan:  -Continue heparin gtt at 1800 units/hr -Daily HL/CBC -Monitor for s/sx of bleeding -f/u plans for long term AC  Saranya Harlin D. Tarynn Garling, PharmD, BCPS Clinical Pharmacist Pager: [phone] 04/16/2015 10:50 PM

## 2015-04-17 DIAGNOSIS — A4151 Sepsis due to Escherichia coli [E. coli]: Principal | ICD-10-CM

## 2015-04-17 LAB — RENAL FUNCTION PANEL
ANION GAP: 13 (ref 5–15)
Albumin: 2.4 g/dL — ABNORMAL LOW (ref 3.5–5.0)
BUN: 92 mg/dL — ABNORMAL HIGH (ref 6–20)
CALCIUM: 8.3 mg/dL — AB (ref 8.9–10.3)
CHLORIDE: 108 mmol/L (ref 101–111)
CO2: 19 mmol/L — ABNORMAL LOW (ref 22–32)
CREATININE: 5.43 mg/dL — AB (ref 0.61–1.24)
GFR calc Af Amer: 11 mL/min — ABNORMAL LOW (ref >60)
GFR calc non Af Amer: 10 mL/min — ABNORMAL LOW (ref >60)
Glucose, Bld: 131 mg/dL — ABNORMAL HIGH (ref 65–99)
POTASSIUM: 3.6 mmol/L (ref 3.5–5.1)
Phosphorus: 5.7 mg/dL — ABNORMAL HIGH (ref 2.5–4.6)
SODIUM: 140 mmol/L (ref 135–145)

## 2015-04-17 LAB — CULTURE, BLOOD (ROUTINE X 2)

## 2015-04-17 LAB — CBC
HCT: 25.1 % — ABNORMAL LOW (ref 39.0–52.0)
HEMOGLOBIN: 8.7 g/dL — AB (ref 13.0–17.0)
MCH: 32.2 pg (ref 26.0–34.0)
MCHC: 34.7 g/dL (ref 30.0–36.0)
MCV: 93 fL (ref 78.0–100.0)
Platelets: 139 10*3/uL — ABNORMAL LOW (ref 150–400)
RBC: 2.7 MIL/uL — ABNORMAL LOW (ref 4.22–5.81)
RDW: 13.5 % (ref 11.5–15.5)
WBC: 5.2 10*3/uL (ref 4.0–10.5)

## 2015-04-17 LAB — HEPARIN LEVEL (UNFRACTIONATED)
Heparin Unfractionated: 0.26 IU/mL — ABNORMAL LOW (ref 0.30–0.70)
Heparin Unfractionated: 0.31 IU/mL (ref 0.30–0.70)

## 2015-04-17 MED ORDER — METOPROLOL TARTRATE 1 MG/ML IV SOLN
INTRAVENOUS | Status: AC
  Administered 2015-04-17: 5 mg
  Filled 2015-04-17: qty 5

## 2015-04-17 MED ORDER — DILTIAZEM HCL 100 MG IV SOLR
5.0000 mg/h | INTRAVENOUS | Status: DC
  Administered 2015-04-17 – 2015-04-18 (×3): 10 mg/h via INTRAVENOUS
  Filled 2015-04-17 (×2): qty 100

## 2015-04-17 MED ORDER — DIGOXIN 0.25 MG/ML IJ SOLN
0.2500 mg | Freq: Once | INTRAMUSCULAR | Status: AC
  Administered 2015-04-17: 0.25 mg via INTRAVENOUS
  Filled 2015-04-17: qty 1

## 2015-04-17 MED ORDER — DILTIAZEM LOAD VIA INFUSION
10.0000 mg | Freq: Once | INTRAVENOUS | Status: AC | PRN
  Filled 2015-04-17: qty 10

## 2015-04-17 MED ORDER — CEFTRIAXONE SODIUM IN DEXTROSE 40 MG/ML IV SOLN
2.0000 g | INTRAVENOUS | Status: DC
  Administered 2015-04-17 – 2015-04-21 (×5): 2 g via INTRAVENOUS
  Filled 2015-04-17 (×5): qty 50

## 2015-04-17 MED ORDER — DILTIAZEM LOAD VIA INFUSION
10.0000 mg | Freq: Once | INTRAVENOUS | Status: AC
  Administered 2015-04-17: 10 mg via INTRAVENOUS
  Filled 2015-04-17: qty 10

## 2015-04-17 NOTE — Care Management Important Message (Signed)
Important Message  Patient Details  Name: [name] MRN: [id number] Date of Birth: 1941-01-15   Medicare Important Message Given:  Yes-second notification given    Pricilla Handler 04/17/2015, 12:00 PM

## 2015-04-17 NOTE — Progress Notes (Signed)
[facility] PROGRESS NOTE  [name] Grip. [id number] DOB: [birth year] DOA: 04/14/2015  PCP: No primary care provider on file.  Brief HPI: 74 year old Caucasian male with a past medical history of hypertension who denies taking any medications at home, presented with complaints of weakness and fatigue. He was found to be in atrial fibrillation with RVR. He had a temperature of 102F, likely secondary to pneumonia. He also was noted to be in renal failure. Patient was hospitalized for further management.  Past medical history:  Past Medical History  Diagnosis Date  . Hypertension     Consultants: Cardiology, Dr. Terrence Dupont. Nephrology  Procedures:  2-D echocardiogram Study Conclusions - Left ventricle: The cavity size was normal. Wall thickness wasincreased in a pattern of mild LVH. Systolic function was normal.The estimated ejection fraction was in the range of 55% to 60%.Wall motion was normal; there were no regional wall motionabnormalities. Left ventricular diastolic function parameterswere normal. - Aortic valve: There was trivial regurgitation. - Atrial septum: No defect or patent foramen ovale was identified.  Antibiotics: Zosyn till 7/28 Ceftriaxone 7/28  Subjective: Patient denies any complaints. Specifically denies any chest pain or shortness of breath. No abdominal pain, nausea or vomiting.   Objective: Vital Signs  Filed Vitals:   04/17/15 0500 04/17/15 0810 04/17/15 0916 04/17/15 1156  BP:  131/94 121/96   Pulse:  122 126 63  Temp:  97.3 F (36.3 C)  98.7 F (37.1 C)  TempSrc:  Oral  Oral  Resp:  20    Height:      Weight: 86.1 kg (189 lb 13.1 oz)     SpO2:  99%  100%    Intake/Output Summary (Last 24 hours) at 04/17/15 1206 Last data filed at 04/17/15 1015  Gross per 24 hour  Intake 1759.2 ml  Output   3050 ml  Net -1290.8 ml   Filed Weights   04/15/15 0500 04/16/15 0500 04/17/15 0500  Weight: 90.8 kg (200 lb 2.8 oz) 89.4 kg (197  lb 1.5 oz) 86.1 kg (189 lb 13.1 oz)    General appearance: alert, cooperative, appears stated age and no distress Resp: Diminished air entry at the bases without any crackles or wheezing. Cardio: S1, S2 tachycardic, irregularly irregular. No S3, S4. No rubs, murmurs, or bruit. No pedal edema. GI: soft, non-tender; bowel sounds normal; no masses,  no organomegaly Extremities: extremities normal, atraumatic, no cyanosis or edema Neurologic: No focal deficits.  Lab Results:  Basic Metabolic Panel:  Recent Labs Lab 04/14/15 1511 04/14/15 2218 04/15/15 0355 04/16/15 0249 04/17/15 0234  NA 136  --  132* 137 140  K 4.7  --  4.4 4.0 3.6  CL 100*  --  100* 106 108  CO2 21*  --  19* 16* 19*  GLUCOSE 142*  --  141* 118* 131*  BUN 73*  --  84* 94* 92*  CREATININE 5.33*  --  6.29* 6.45* 5.43*  CALCIUM 9.5  --  8.4* 8.3* 8.3*  PHOS  --  4.2  --  6.7* 5.7*   Liver Function Tests:  Recent Labs Lab 04/14/15 1511 04/15/15 0355 04/16/15 0249 04/17/15 0234  AST 22 17  --   --   ALT 11* 13*  --   --   ALKPHOS 59 53  --   --   BILITOT 0.7 0.5  --   --   PROT 8.0 6.2*  --   --   ALBUMIN 3.9 3.0* 2.8* 2.4*   CBC:  Recent Labs Lab 04/14/15 1511 04/15/15 0355 04/16/15 0249 04/17/15 0234  WBC 12.8* 7.4 6.0 5.2  NEUTROABS  --  6.8  --   --   HGB 10.6* 9.0* 9.0* 8.7*  HCT 31.3* 25.7* 26.3* 25.1*  MCV 95.4 92.8 93.6 93.0  PLT 177 122* 129* 139*   Cardiac Enzymes:  Recent Labs Lab 04/14/15 1511 04/14/15 2218 04/15/15 0355 04/15/15 0921  TROPONINI 0.16* 0.19* 0.18* 0.14*    Recent Results (from the past 240 hour(s))  Blood culture (routine x 2)     Status: None   Collection Time: 04/14/15  5:15 PM  Result Value Ref Range Status   Specimen Description BLOOD LEFT ANTECUBITAL  Final   Special Requests BOTTLES DRAWN AEROBIC AND ANAEROBIC 5CC EACH  Final   Culture  Setup Time   Final    GRAM NEGATIVE RODS IN BOTH AEROBIC AND ANAEROBIC BOTTLES CRITICAL RESULT CALLED TO,  READ BACK BY AND VERIFIED WITH: A AGUIRRE,RN AT [id number] 04/15/15 BY L BENFIELD    Culture   Final    ESCHERICHIA COLI SUSCEPTIBILITIES PERFORMED ON PREVIOUS CULTURE WITHIN THE LAST 5 DAYS. Performed at [facility]    Report Status 04/17/2015 FINAL  Final  Blood culture (routine x 2)     Status: None   Collection Time: 04/14/15  5:30 PM  Result Value Ref Range Status   Specimen Description BLOOD RIGHT ARM  Final   Special Requests BOTTLES DRAWN AEROBIC ONLY 4CC  Final   Culture  Setup Time   Final    GRAM NEGATIVE RODS AEROBIC BOTTLE ONLY CRITICAL RESULT CALLED TO, READ BACK BY AND VERIFIED WITH: C WHITE,RN AT 1055 04/15/15 BY L BENFIELD    Culture   Final    ESCHERICHIA COLI Performed at [facility]    Report Status 04/17/2015 FINAL  Final   Organism ID, Bacteria ESCHERICHIA COLI  Final      Susceptibility   Escherichia coli - MIC*    AMPICILLIN <=2 SENSITIVE Sensitive     CEFAZOLIN <=4 SENSITIVE Sensitive     CEFEPIME <=1 SENSITIVE Sensitive     CEFTAZIDIME <=1 SENSITIVE Sensitive     CEFTRIAXONE <=1 SENSITIVE Sensitive     CIPROFLOXACIN <=0.25 SENSITIVE Sensitive     GENTAMICIN <=1 SENSITIVE Sensitive     IMIPENEM <=0.25 SENSITIVE Sensitive     TRIMETH/SULFA <=20 SENSITIVE Sensitive     AMPICILLIN/SULBACTAM <=2 SENSITIVE Sensitive     PIP/TAZO <=4 SENSITIVE Sensitive     * ESCHERICHIA COLI  MRSA PCR Screening     Status: None   Collection Time: 04/14/15  9:50 PM  Result Value Ref Range Status   MRSA by PCR NEGATIVE NEGATIVE Final    Comment:        The GeneXpert MRSA Assay (FDA approved for NASAL specimens only), is one component of a comprehensive MRSA colonization surveillance program. It is not intended to diagnose MRSA infection nor to guide or monitor treatment for MRSA infections.       Studies/Results: No results found.  Medications:  Scheduled: . amiodarone  200 mg Oral BID  . antiseptic oral rinse  7 mL Mouth Rinse BID  . aspirin   81 mg Oral Daily  . cefTRIAXone (ROCEPHIN)  IV  2 g Intravenous Q24H  . feeding supplement (ENSURE ENLIVE)  237 mL Oral BID BM  . metoprolol tartrate  50 mg Oral BID  . sodium chloride  3 mL Intravenous Q12H   Continuous: . diltiazem (CARDIZEM)  infusion 10 mg/hr (04/17/15 1100)  . heparin 2,000 Units/hr (04/17/15 0751)  . sodium chloride 0.9 % 1,000 mL infusion 75 mL/hr at 04/17/15 0531   [id number] **OR** acetaminophen, diltiazem, ondansetron **OR** ondansetron (ZOFRAN) IV  Assessment/Plan:  Principal Problem:   Sepsis Active Problems:   Atrial fibrillation with RVR   CAP (community acquired pneumonia)   Essential hypertension   Elevated troponin   Normocytic hypochromic anemia   CKD (chronic kidney disease) stage 4, GFR 15-29 ml/min   Gram-negative bacteremia   AKI (acute kidney injury)   Acute renal failure    Sepsis with Escherichia coli bacteremia Source is likely UTI. Chest x-ray also suggested pneumonia. Patient was initially started on ceftriaxone and azithromycin. Was changed over to [facility] 7/26. Blood cultures growing Escherichia coli. Sensitivities have been noted. Patient changed over to ceftriaxone. Noted to have high pro-calcitonin levels. Lactic acid level was normal. Hemodynamically stable.  Acute renal failure with bilateral hydronephrosis Some improvement in creatinine today. Nephrology is following. Case has been discussed with urology as well. Continue Foley catheter. He will need Flomax at the time of discharge. Continue IV fluids per nephrology. Baseline renal function not entirely clear. Patient was taking NSAIDs at home. Monitor urine output.  Atrial fibrillation/atrial flutter with RVR Heart rate remains poorly controlled. Cardiology is following. He did convert to sinus rhythm briefly but then converted back to atrial flutter. Patient is on a heparin infusion. Also on Cardizem infusion and amiodarone. Further management per  cardiology.  Questionable left lower lobe pneumonia Will need follow-up chest x-ray in 4-6 weeks. Continue antibiotics.  Elevated troponin, likely secondary to demand ischemia Multifactorial due to sepsis, acute renal failure, atrial fibrillation. Continue to monitor for now. Continue aspirin. Already on beta blockers.  Anemia of chronic disease Hemoglobin is stable. Continue to monitor  DVT Prophylaxis: On IV heparin    Code Status: DO NOT RESUSCITATE  Family Communication: Discussed with the patient and his wife Disposition Plan: Will remain in step down for now.  Follow-up Appointment?: He will need a PCP.   LOS: 3 days   [place] [facility] Pager [phone] 04/17/2015, 12:06 PM  If 7PM-7AM, please contact night-coverage at [email or web address], password [facility]

## 2015-04-17 NOTE — Progress Notes (Signed)
ANTICOAGULATION CONSULT NOTE - Follow Up Consult  Pharmacy Consult for Heparin Indication: afib   No Known Allergies  Patient Measurements: Height: 5\' 4"  (162.6 cm) Weight: 189 lb 13.1 oz (86.1 kg) IBW/kg (Calculated) : 59.2 Heparin Dosing Weight:  78 kg  Vital Signs: Temp: 98.7 F (37.1 C) (07/28 1156) Temp Source: Oral (07/28 1156) BP: 121/96 mmHg (07/28 0916) Pulse Rate: 63 (07/28 1156)  Labs:  Recent Labs  04/14/15 2218 04/15/15 0355 04/15/15 0921  04/16/15 0249  04/16/15 2204 04/17/15 0234 04/17/15 1234  HGB  --  9.0*  --   --  9.0*  --   --  8.7*  --   HCT  --  25.7*  --   --  26.3*  --   --  25.1*  --   PLT  --  122*  --   --  129*  --   --  139*  --   APTT 59*  --   --   --   --   --   --   --   --   LABPROT 18.9*  --   --   --   --   --   --   --   --   INR 1.58*  --   --   --   --   --   --   --   --   HEPARINUNFRC  --  0.16*  --   < > 0.25*  < > 0.36 0.26* 0.31  CREATININE  --  6.29*  --   --  6.45*  --   --  5.43*  --   TROPONINI 0.19* 0.18* 0.14*  --   --   --   --   --   --   < > = values in this interval not displayed.  Estimated Creatinine Clearance: 12.4 mL/min (by C-G formula based on Cr of 5.43).   Assessment: 73yom presented to [facility] ED with weakness that started a week ago but has gradually been worsening. He was found to be in new onset afib RVR (CHADSVASC 2). He will begin IV heparin. Noted to be in acute renal failure with sCr 5.33 and CrCl ~ 4ml/min.   PMH: HTN  Anticoagulation: Heparin for new onset afib. Heparin started 7/25, pt has not been therapeutic as of 7/27. HL 0.16>0.29>0.25>0.26>0.36>0.26 low again this AM. H/H 8.7/25.1 low, Plt 139 low but stable. No s/sx of bleeding noted.Chads Vas score of 3. Heparin now 0.31 but prone to decline  Goal of Therapy:  Heparin level 0.3-0.7 units/ml Monitor platelets by anticoagulation protocol: Yes   Plan:  Increase heparin to 2100 units/hr  Heparin level and CBC in AM.  Kenneth Lax S.  Alford Highland, PharmD, BCPS Clinical Staff Pharmacist Pager [phone]  Eilene Ghazi Stillinger 04/17/2015,1:43 PM

## 2015-04-17 NOTE — Progress Notes (Signed)
Subjective:  Patient denies any chest pain or shortness of breath. Remains in atrial flutter with variable AV block. Heart rate stays between 110 to 150 bpm. Received 5 mg of IV Lopressor without significant improvement in heart rate subsequently received a total of 20 mg IV Cardizem with control of heart rate between 100 to 120s and started on drip.  Objective:  Vital Signs in the last 24 hours: Temp:  [97.3 F (36.3 C)-99.5 F (37.5 C)] 97.3 F (36.3 C) (07/28 0810) Pulse Rate:  [113-126] 126 (07/28 0916) Resp:  [12-26] 20 (07/28 0810) BP: (99-131)/(60-96) 121/96 mmHg (07/28 0916) SpO2:  [88 %-100 %] 99 % (07/28 0810) Weight:  [86.1 kg (189 lb 13.1 oz)] 86.1 kg (189 lb 13.1 oz) (07/28 0500)  Intake/Output from previous day: 07/27 0701 - 07/28 0700 In: 2439.2 [P.O.:1077; I.V.:1212.2; IV Piggyback:150] Out: [id number] [Urine:3475] Intake/Output from this shift: Total I/O In: 123 [P.O.:120; I.V.:3] Out: 475 [Urine:475]  Physical Exam: Neck: no adenopathy, no carotid bruit, no JVD and supple, symmetrical, trachea midline Lungs: Decreased breath sounds at bases Heart: regularly irregular rhythm, S1, S2 normal and Soft systolic murmur noted no S3 gallop Abdomen: soft, non-tender; bowel sounds normal; no masses,  no organomegaly Extremities: extremities normal, atraumatic, no cyanosis or edema  Lab Results:  Recent Labs  04/16/15 0249 04/17/15 0234  WBC 6.0 5.2  HGB 9.0* 8.7*  PLT 129* 139*    Recent Labs  04/16/15 0249 04/17/15 0234  NA 137 140  K 4.0 3.6  CL 106 108  CO2 16* 19*  GLUCOSE 118* 131*  BUN 94* 92*  CREATININE 6.45* 5.43*    Recent Labs  04/15/15 0355 04/15/15 0921  TROPONINI 0.18* 0.14*   Hepatic Function Panel  Recent Labs  04/15/15 0355  04/17/15 0234  PROT 6.2*  --   --   ALBUMIN 3.0*  < > 2.4*  AST 17  --   --   ALT 13*  --   --   ALKPHOS 53  --   --   BILITOT 0.5  --   --   < > = values in this interval not displayed.  Recent Labs  04/15/15 0355  CHOL 136   No results for input(s): PROTIME in the last 72 hours.  Imaging: Imaging results have been reviewed and No results found.  Cardiac Studies:  Assessment/Plan:  Recurrent A. fib /atrial flutter with RVR chads Vas score of 3 Probable very small type II MI secondary to demand ischemia/abnormal EKG rule out coronary insufficiency Hypertension New-onset diabetes Hypercholesteremia Community acquired left pneumonia UTI Probable acute on chronic kidney injury secondary to obstructive uropathy Dehydration Anemia of chronic disease Status post rectal bleeding Plan Continue Cardizem drip 10 mg per hour and wean down to 5 mg/h. if heart rate stays below 100 Digoxin 0.25 mg IV 1  LOS: 3 days    Charolette Forward 04/17/2015, 10:49 AM

## 2015-04-17 NOTE — Consult Note (Signed)
ANTICOAGULATION CONSULT NOTE  Pharmacy Consult for Heparin Indication: atrial fibrillation  No Known Allergies  Patient Measurements: Height: 5\' 4"  (162.6 cm) Weight: 197 lb 1.5 oz (89.4 kg) IBW/kg (Calculated) : 59.2 Heparin Dosing Weight: 78.5kg  Vital Signs: Temp: 98.8 F (37.1 C) (07/28 0400) Temp Source: Axillary (07/28 0400) BP: 126/89 mmHg (07/28 0000)  Labs:  Recent Labs  04/14/15 2218 04/15/15 0355 04/15/15 0921  04/16/15 0249 04/16/15 1110 04/16/15 2204 04/17/15 0234  HGB  --  9.0*  --   --  9.0*  --   --  8.7*  HCT  --  25.7*  --   --  26.3*  --   --  25.1*  PLT  --  122*  --   --  129*  --   --  139*  APTT 59*  --   --   --   --   --   --   --   LABPROT 18.9*  --   --   --   --   --   --   --   INR 1.58*  --   --   --   --   --   --   --   HEPARINUNFRC  --  0.16*  --   < > 0.25* 0.26* 0.36 0.26*  CREATININE  --  6.29*  --   --  6.45*  --   --  5.43*  TROPONINI 0.19* 0.18* 0.14*  --   --   --   --   --   < > = values in this interval not displayed.  Estimated Creatinine Clearance: 12.6 mL/min (by C-G formula based on Cr of 5.43).  Assessment: 74 y.o. male with Afib for heparin  Goal of Therapy:  Heparin level 0.3-0.7 units/ml Monitor platelets by anticoagulation protocol: Yes   Plan:  Increase Heparin 2000 units/hr   [place]ana Sanon, [name] 04/17/2015,4:20 AM

## 2015-04-17 NOTE — Progress Notes (Signed)
Subjective: [name] is a 74 yo male admitted with acute renal failure likely secondary to Bladder Outlet Obstruction 2/2 BPH and possible underlying ischemic injury. Patient was seen and examined this morning. He denies any complaints.  Objective: Filed Vitals:   04/17/15 0401 04/17/15 0500 04/17/15 0810 04/17/15 0916  BP:   131/94 121/96  Pulse:   122 126  Temp:   97.3 F (36.3 C)   TempSrc:   Oral   Resp:   20   Height:      Weight:  189 lb 13.1 oz (86.1 kg)    SpO2: 90%  99%    General: Vital signs reviewed. Patient is elderly male, in no acute distress and cooperative with exam.  Cardiovascular: RRR, S1 normal, S2 normal, no murmurs, gallops, or rubs. Pulmonary/Chest: Mild crackles at bilateral bases, no wheezing, or rhonchi. Abdominal: Soft, non-tender, non-distended, BS + GU: Foley in place with pink cloudy urine Extremities: No lower extremity edema bilaterally. Neurological: Alert, appropriate  Skin: Warm, dry and intact. No rashes or erythema.  Intake/Output from previous day: 07/27 0701 - 07/28 0700 In: 2439.2 [P.O.:1077; I.V.:1212.2; IV Piggyback:150] Out: [id number] [Urine:3475] Intake/Output this shift: Total I/O In: 123 [P.O.:120; I.V.:3] Out: 475 [Urine:475]  Lab Results:  Recent Labs  04/16/15 0249 04/17/15 0234  WBC 6.0 5.2  HGB 9.0* 8.7*  HCT 26.3* 25.1*  PLT 129* 139*   BMET:   Recent Labs  04/16/15 0249 04/17/15 0234  NA 137 140  K 4.0 3.6  CL 106 108  CO2 16* 19*  GLUCOSE 118* 131*  BUN 94* 92*  CREATININE 6.45* 5.43*  CALCIUM 8.3* 8.3*   Iron Studies:   Recent Labs  04/14/15 10-Jan-2217  IRON 6*  TIBC 231*  FERRITIN 327   I have reviewed the patient's current medications. Prior to Admission:  Prescriptions prior to admission  Medication Sig Dispense Refill Last Dose  . aspirin 325 MG tablet Take 325 mg by mouth every 4 (four) hours as needed for mild pain, moderate pain or headache.   over 30 days  . Aspirin-Acetaminophen (GOODY  BODY PAIN) 500-325 MG PACK Take 1 Package by mouth daily as needed (pain, headaches).   04/14/2015 at 1200   Scheduled: . amiodarone  200 mg Oral BID  . antiseptic oral rinse  7 mL Mouth Rinse BID  . aspirin  81 mg Oral Daily  . cefTRIAXone (ROCEPHIN)  IV  2 g Intravenous Q24H  . digoxin  0.25 mg Intravenous Once  . feeding supplement (ENSURE ENLIVE)  237 mL Oral BID BM  . metoprolol tartrate  50 mg Oral BID  . sodium chloride  3 mL Intravenous Q12H   Continuous: . heparin 2,000 Units/hr (04/17/15 0751)  . sodium chloride 0.9 % 1,000 mL infusion 75 mL/hr at 04/17/15 0531   [id number] **OR** acetaminophen, ondansetron **OR** ondansetron (ZOFRAN) IV  Assessment/Plan: Acute Renal Failure: Likely secondary to bladder outlet obstruction from BPH and likely underlying ischemic injury from hypotension and afib/aflutter. Creatinine has trended 6.45>5.43 this morning with foley and gently hydration. Unknow baseline. Good UOP of -[id number] yesterday. Patient seems to be do quite well and improving with the foley and gentle hydration. Maintaining adequate blood pressure and controlling heart rate will assist in improving his kidney function. Nephrology will sign off.  -NS 75 cc/hr  -Keep foley in place  Bilateral Hydronephrosis: Likely secondary to BPH. Urology has been consulted who recommend keeping foley in after d/c until patient can be seen at follow up.  They also recommend starting flomax 0.4 mg at discharge. -Keep foley  Atrial Flutter: Patient is on metoprolol and amiodarone. -Per Cardiology  Sepsis: Secondary to UTI. Patient is on Zosyn.  -Per primary  Nephrology will sign off.    LOS: 3 days   Osa Craver, DO PGY-2 Internal Medicine Resident Pager # [phone] 04/17/2015 10:51 AM

## 2015-04-17 NOTE — Care Management Important Message (Signed)
Important Message  Patient Details  Name: [name] MRN: [id number] Date of Birth: 1941-06-19   Medicare Important Message Given:  Yes-second notification given    Nathen May 04/17/2015, 12:36 [place] Message  Patient Details  Name: [name] MRN: [id number] Date of Birth: [birth year]   Medicare Important Message Given:  Yes-second notification given    Nathen May 04/17/2015, 12:36 PM

## 2015-04-18 LAB — RENAL FUNCTION PANEL
Albumin: 2.5 g/dL — ABNORMAL LOW (ref 3.5–5.0)
Anion gap: 13 (ref 5–15)
BUN: 80 mg/dL — ABNORMAL HIGH (ref 6–20)
CHLORIDE: 107 mmol/L (ref 101–111)
CO2: 21 mmol/L — ABNORMAL LOW (ref 22–32)
CREATININE: 4.54 mg/dL — AB (ref 0.61–1.24)
Calcium: 8.3 mg/dL — ABNORMAL LOW (ref 8.9–10.3)
GFR calc non Af Amer: 12 mL/min — ABNORMAL LOW (ref >60)
GFR, EST AFRICAN AMERICAN: 14 mL/min — AB (ref >60)
Glucose, Bld: 133 mg/dL — ABNORMAL HIGH (ref 65–99)
Phosphorus: 5 mg/dL — ABNORMAL HIGH (ref 2.5–4.6)
Potassium: 3.2 mmol/L — ABNORMAL LOW (ref 3.5–5.1)
SODIUM: 141 mmol/L (ref 135–145)

## 2015-04-18 LAB — URINE CULTURE: CULTURE: NO GROWTH

## 2015-04-18 LAB — CBC
HCT: 25.6 % — ABNORMAL LOW (ref 39.0–52.0)
Hemoglobin: 8.8 g/dL — ABNORMAL LOW (ref 13.0–17.0)
MCH: 31.7 pg (ref 26.0–34.0)
MCHC: 34.4 g/dL (ref 30.0–36.0)
MCV: 92.1 fL (ref 78.0–100.0)
PLATELETS: 160 10*3/uL (ref 150–400)
RBC: 2.78 MIL/uL — ABNORMAL LOW (ref 4.22–5.81)
RDW: 13.2 % (ref 11.5–15.5)
WBC: 7.2 10*3/uL (ref 4.0–10.5)

## 2015-04-18 LAB — HEPARIN LEVEL (UNFRACTIONATED): HEPARIN UNFRACTIONATED: 0.59 [IU]/mL (ref 0.30–0.70)

## 2015-04-18 MED ORDER — DILTIAZEM HCL 60 MG PO TABS
60.0000 mg | ORAL_TABLET | Freq: Four times a day (QID) | ORAL | Status: DC
  Administered 2015-04-18 – 2015-04-22 (×16): 60 mg via ORAL
  Filled 2015-04-18 (×23): qty 1

## 2015-04-18 NOTE — Progress Notes (Signed)
[facility] PROGRESS NOTE  [name] [id number] DOB: 1941/02/16 DOA: 04/14/2015  PCP: No primary care provider on file.  Brief HPI: 74 year old Caucasian male with a past medical history of hypertension who denies taking any medications at home, presented with complaints of weakness and fatigue. He was found to be in atrial fibrillation with RVR. He had a temperature of 102F, likely secondary to pneumonia. He also was noted to be in renal failure. Patient was hospitalized for further management. Patient was seen by cardiology. He was placed on a Cardizem infusion. He was also started on amiodarone. Renal function started improving.  Past medical history:  Past Medical History  Diagnosis Date  . Hypertension     Consultants: Cardiology, Dr. Terrence Dupont. Nephrology  Procedures:  2-D echocardiogram Study Conclusions - Left ventricle: The cavity size was normal. Wall thickness wasincreased in a pattern of mild LVH. Systolic function was normal.The estimated ejection fraction was in the range of 55% to 60%.Wall motion was normal; there were no regional wall motionabnormalities. Left ventricular diastolic function parameterswere normal. - Aortic valve: There was trivial regurgitation. - Atrial septum: No defect or patent foramen ovale was identified.  Antibiotics: Zosyn till 7/28 Ceftriaxone 7/28  Subjective: Patient feels about the same. Denies any chest pain or shortness of breath. No nausea, vomiting.   Objective: Vital Signs  Filed Vitals:   04/17/15 2000 04/18/15 0000 04/18/15 0400 04/18/15 0500  BP: 154/78 145/84 140/86   Pulse:      Temp: 98.8 F (37.1 C) 97.9 F (36.6 C) 98 F (36.7 C)   TempSrc: Oral Oral Oral   Resp: 27 27 23    Height:      Weight:    82.7 kg (182 lb 5.1 oz)  SpO2: 98% 97% 99%     Intake/Output Summary (Last 24 hours) at 04/18/15 0738 Last data filed at 04/18/15 0630  Gross per 24 hour  Intake 2320.22 ml  Output   5225  ml  Net -2904.78 ml   Filed Weights   04/16/15 0500 04/17/15 0500 04/18/15 0500  Weight: 89.4 kg (197 lb 1.5 oz) 86.1 kg (189 lb 13.1 oz) 82.7 kg (182 lb 5.1 oz)    General appearance: alert, cooperative, appears stated age and no distress Resp: Diminished air entry at the bases without any crackles or wheezing. Cardio: S1, S2 irregularly irregular. No S3, S4. No rubs, murmurs, or bruit. No pedal edema. GI: soft, non-tender; bowel sounds normal; no masses,  no organomegaly Extremities: extremities normal, atraumatic, no cyanosis or edema Neurologic: No focal deficits.  Lab Results:  Basic Metabolic Panel:  Recent Labs Lab 04/14/15 1511 04/14/15 2218 04/15/15 0355 04/16/15 0249 04/17/15 0234 04/18/15 0229  NA 136  --  132* 137 140 141  K 4.7  --  4.4 4.0 3.6 3.2*  CL 100*  --  100* 106 108 107  CO2 21*  --  19* 16* 19* 21*  GLUCOSE 142*  --  141* 118* 131* 133*  BUN 73*  --  84* 94* 92* 80*  CREATININE 5.33*  --  6.29* 6.45* 5.43* 4.54*  CALCIUM 9.5  --  8.4* 8.3* 8.3* 8.3*  PHOS  --  4.2  --  6.7* 5.7* 5.0*   Liver Function Tests:  Recent Labs Lab 04/14/15 1511 04/15/15 0355 04/16/15 0249 04/17/15 0234 04/18/15 0229  AST 22 17  --   --   --   ALT 11* 13*  --   --   --  ALKPHOS 59 53  --   --   --   BILITOT 0.7 0.5  --   --   --   PROT 8.0 6.2*  --   --   --   ALBUMIN 3.9 3.0* 2.8* 2.4* 2.5*   CBC:  Recent Labs Lab 04/14/15 1511 04/15/15 0355 04/16/15 0249 04/17/15 0234 04/18/15 0500  WBC 12.8* 7.4 6.0 5.2 7.2  NEUTROABS  --  6.8  --   --   --   HGB 10.6* 9.0* 9.0* 8.7* 8.8*  HCT 31.3* 25.7* 26.3* 25.1* 25.6*  MCV 95.4 92.8 93.6 93.0 92.1  PLT 177 122* 129* 139* 160   Cardiac Enzymes:  Recent Labs Lab 04/14/15 1511 04/14/15 2218 04/15/15 0355 04/15/15 0921  TROPONINI 0.16* 0.19* 0.18* 0.14*    Recent Results (from the past 240 hour(s))  Blood culture (routine x 2)     Status: None   Collection Time: 04/14/15  5:15 PM  Result Value Ref  Range Status   Specimen Description BLOOD LEFT ANTECUBITAL  Final   Special Requests BOTTLES DRAWN AEROBIC AND ANAEROBIC 5CC EACH  Final   Culture  Setup Time   Final    GRAM NEGATIVE RODS IN BOTH AEROBIC AND ANAEROBIC BOTTLES CRITICAL RESULT CALLED TO, READ BACK BY AND VERIFIED WITH: A AGUIRRE,RN AT [id number] 04/15/15 BY L BENFIELD    Culture   Final    ESCHERICHIA COLI SUSCEPTIBILITIES PERFORMED ON PREVIOUS CULTURE WITHIN THE LAST 5 DAYS. Performed at [facility]    Report Status 04/17/2015 FINAL  Final  Blood culture (routine x 2)     Status: None   Collection Time: 04/14/15  5:30 PM  Result Value Ref Range Status   Specimen Description BLOOD RIGHT ARM  Final   Special Requests BOTTLES DRAWN AEROBIC ONLY 4CC  Final   Culture  Setup Time   Final    GRAM NEGATIVE RODS AEROBIC BOTTLE ONLY CRITICAL RESULT CALLED TO, READ BACK BY AND VERIFIED WITH: C WHITE,RN AT 1055 04/15/15 BY L BENFIELD    Culture   Final    ESCHERICHIA COLI Performed at [facility]    Report Status 04/17/2015 FINAL  Final   Organism ID, Bacteria ESCHERICHIA COLI  Final      Susceptibility   Escherichia coli - MIC*    AMPICILLIN <=2 SENSITIVE Sensitive     CEFAZOLIN <=4 SENSITIVE Sensitive     CEFEPIME <=1 SENSITIVE Sensitive     CEFTAZIDIME <=1 SENSITIVE Sensitive     CEFTRIAXONE <=1 SENSITIVE Sensitive     CIPROFLOXACIN <=0.25 SENSITIVE Sensitive     GENTAMICIN <=1 SENSITIVE Sensitive     IMIPENEM <=0.25 SENSITIVE Sensitive     TRIMETH/SULFA <=20 SENSITIVE Sensitive     AMPICILLIN/SULBACTAM <=2 SENSITIVE Sensitive     PIP/TAZO <=4 SENSITIVE Sensitive     * ESCHERICHIA COLI  MRSA PCR Screening     Status: None   Collection Time: 04/14/15  9:50 PM  Result Value Ref Range Status   MRSA by PCR NEGATIVE NEGATIVE Final    Comment:        The GeneXpert MRSA Assay (FDA approved for NASAL specimens only), is one component of a comprehensive MRSA colonization surveillance program. It is  not intended to diagnose MRSA infection nor to guide or monitor treatment for MRSA infections.       Studies/Results: No results found.  Medications:  Scheduled: . amiodarone  200 mg Oral BID  . antiseptic oral rinse  7  mL Mouth Rinse BID  . aspirin  81 mg Oral Daily  . cefTRIAXone (ROCEPHIN)  IV  2 g Intravenous Q24H  . feeding supplement (ENSURE ENLIVE)  237 mL Oral BID BM  . metoprolol tartrate  50 mg Oral BID  . sodium chloride  3 mL Intravenous Q12H   Continuous: . diltiazem (CARDIZEM) infusion 10 mg/hr (04/18/15 0200)  . heparin 2,100 Units/hr (04/17/15 2310)  . sodium chloride 0.9 % 1,000 mL infusion 75 mL/hr at 04/17/15 0531   [id number] **OR** acetaminophen, ondansetron **OR** ondansetron (ZOFRAN) IV  Assessment/Plan:  Principal Problem:   Sepsis Active Problems:   Atrial fibrillation with RVR   CAP (community acquired pneumonia)   Essential hypertension   Elevated troponin   Normocytic hypochromic anemia   CKD (chronic kidney disease) stage 4, GFR 15-29 ml/min   Gram-negative bacteremia   AKI (acute kidney injury)   Acute renal failure    Sepsis with Escherichia coli bacteremia Source is likely UTI. Chest x-ray also suggested pneumonia. Patient was initially started on ceftriaxone and azithromycin. Was changed over to [facility] 7/26. Blood cultures growing Escherichia coli. Sensitivities have been noted. Patient changed over to ceftriaxone. Noted to have high pro-calcitonin levels. Lactic acid level was normal. Hemodynamically stable.  Acute renal failure with bilateral hydronephrosis Continues to show improvement in creatinine. Has good urine output. Nephrology has signed off. Renal ultrasound showed bilateral hydronephrosis. Case has been discussed with urology as well. Continue Foley catheter. Will need to be discharged with Foley catheter. He will need Flomax at the time of discharge. He will need follow-up with urology as an outpatient. Continue IV  fluids. Baseline renal function not entirely clear. Patient was taking NSAIDs at home.  Atrial fibrillation/atrial flutter with RVR Heart rate appears to be better today. Cardiology is managing. Continue heparin infusion. We will need to start warfarin soon.   Questionable left lower lobe pneumonia Will need follow-up chest x-ray in 4-6 weeks. Continue antibiotics.  Elevated troponin, likely secondary to demand ischemia Multifactorial due to sepsis, acute renal failure, atrial fibrillation. No wall motion abnormality noted on echocardiogram. Cardiology is following. Continue aspirin. Already on beta blockers.  Anemia of chronic disease Hemoglobin is stable. Continue to monitor  DVT Prophylaxis: On IV heparin    Code Status: DO NOT RESUSCITATE  Family Communication: Discussed with the patient and his wife Disposition Plan: Will remain in step down for now.   Follow-up Appointment?: He will need a PCP.   LOS: 4 days   [facility] Pager [phone] 04/18/2015, 7:38 AM  If 7PM-7AM, please contact night-coverage at [email or web address], password [facility]

## 2015-04-18 NOTE — Progress Notes (Signed)
Subjective:   Doing well.  Denies any chest pain or shortness of breath, converted to sinus rhythm.  Renal function slowly improving  Objective:  Vital Signs in the last 24 hours: Temp:  [97.6 F (36.4 C)-98.8 F (37.1 C)] 97.6 F (36.4 C) (07/29 0800) Pulse Rate:  [63-126] 122 (07/29 0800) Resp:  [19-27] 19 (07/29 0800) BP: (121-154)/(78-96) 134/92 mmHg (07/29 0800) SpO2:  [97 %-100 %] 97 % (07/29 0800) Weight:  [82.7 kg (182 lb 5.1 oz)] 82.7 kg (182 lb 5.1 oz) (07/29 0500)  Intake/Output from previous day: 07/28 0701 - 07/29 0700 In: 2426.2 [P.O.:837; I.V.:1589.2] Out: 5225 [Urine:5225] Intake/Output from this shift: Total I/O In: 106 [I.V.:106] Out: -   Physical Exam: Neck: no adenopathy, no carotid bruit, no JVD and supple, symmetrical, trachea midline Lungs: decreased breath sounds at bases Heart: regular rate and rhythm, S1, S2 normal and soft systolic murmur noted Abdomen: soft, non-tender; bowel sounds normal; no masses,  no organomegaly Extremities: extremities normal, atraumatic, no cyanosis or edema  Lab Results:  Recent Labs  04/17/15 0234 04/18/15 0500  WBC 5.2 7.2  HGB 8.7* 8.8*  PLT 139* 160    Recent Labs  04/17/15 0234 04/18/15 0229  NA 140 141  K 3.6 3.2*  CL 108 107  CO2 19* 21*  GLUCOSE 131* 133*  BUN 92* 80*  CREATININE 5.43* 4.54*    Recent Labs  04/15/15 0921  TROPONINI 0.14*   Hepatic Function Panel  Recent Labs  04/18/15 0229  ALBUMIN 2.5*   No results for input(s): CHOL in the last 72 hours. No results for input(s): PROTIME in the last 72 hours.  Imaging: Imaging results have been reviewed and No results found.  Cardiac Studies:  Assessment/Plan:  Status postRecurrent A. fib /atrial flutter with RVR chads Vas score of 3 Probable very small type II MI secondary to demand ischemia/abnormal EKG rule out coronary insufficiency Hypertension New-onset diabetes Hypercholesteremia Community acquired left  pneumonia UTI Probable acute on chronic kidney injury secondary to obstructive uropathy Dehydration Anemia of chronic disease Status post rectal bleeding Plan Weaning off IV Cardizem, switch to by mouth as per orders. Consider starting Coumadin. Dr. Doylene Canard on call for me for weekend.  LOS: 4 days    Charolette Forward 04/18/2015, 8:44 AM

## 2015-04-18 NOTE — Progress Notes (Addendum)
ANTICOAGULATION CONSULT NOTE - Follow Up Consult  Pharmacy Consult for Heparin Indication: atrial fibrillation  No Known Allergies  Patient Measurements: Height: 5\' 4"  (162.6 cm) Weight: 182 lb 5.1 oz (82.7 kg) IBW/kg (Calculated) : 59.2 Heparin Dosing Weight: 78 kg  Vital Signs: Temp: 97.6 F (36.4 C) (07/29 0800) Temp Source: Axillary (07/29 0800) BP: 136/61 mmHg (07/29 1124) Pulse Rate: 64 (07/29 1000)  Labs:  Recent Labs  04/16/15 0249  04/17/15 0234 04/17/15 1234 04/18/15 0229 04/18/15 0500  HGB 9.0*  --  8.7*  --   --  8.8*  HCT 26.3*  --  25.1*  --   --  25.6*  PLT 129*  --  139*  --   --  160  HEPARINUNFRC 0.25*  < > 0.26* 0.31 0.59  --   CREATININE 6.45*  --  5.43*  --  4.54*  --   < > = values in this interval not displayed.  Estimated Creatinine Clearance: 14.5 mL/min (by C-G formula based on Cr of 4.54).  Assessment: Anticoagulation: Heparin for new onset afib. Heparin started 7/25.  HL difficult to get therapeutic. Heparin level 0.59 finally remains in goal range. Hgb 8.8 stable. Plts stable.   Goal of Therapy:  Heparin level 0.3-0.7 units/ml Monitor platelets by anticoagulation protocol: Yes   Plan:  Continue Heparin at 2100 units/hr  Heparin level and CBC in AM. Considering Coumadin    Laken Lobato S. Alford Highland, PharmD, BCPS Clinical Staff Pharmacist Pager [phone]  Eilene Ghazi Stillinger 04/18/2015,12:37 PM

## 2015-04-19 DIAGNOSIS — R319 Hematuria, unspecified: Secondary | ICD-10-CM

## 2015-04-19 HISTORY — DX: Hematuria, unspecified: R31.9

## 2015-04-19 LAB — CBC
HEMATOCRIT: 21.8 % — AB (ref 39.0–52.0)
HEMATOCRIT: 27.8 % — AB (ref 39.0–52.0)
Hemoglobin: 7.4 g/dL — ABNORMAL LOW (ref 13.0–17.0)
Hemoglobin: 9.3 g/dL — ABNORMAL LOW (ref 13.0–17.0)
MCH: 31.5 pg (ref 26.0–34.0)
MCH: 31.6 pg (ref 26.0–34.0)
MCHC: 33.9 g/dL (ref 30.0–36.0)
MCHC: 33.5 g/dL (ref 30.0–36.0)
MCV: 92.8 fL (ref 78.0–100.0)
MCV: 94.6 fL (ref 78.0–100.0)
PLATELETS: 188 10*3/uL (ref 150–400)
Platelets: 166 10*3/uL (ref 150–400)
RBC: 2.35 MIL/uL — AB (ref 4.22–5.81)
RBC: 2.94 MIL/uL — ABNORMAL LOW (ref 4.22–5.81)
RDW: 13.1 % (ref 11.5–15.5)
RDW: 13 % (ref 11.5–15.5)
WBC: 7.6 10*3/uL (ref 4.0–10.5)
WBC: 8.6 10*3/uL (ref 4.0–10.5)

## 2015-04-19 LAB — URINALYSIS, ROUTINE W REFLEX MICROSCOPIC
BILIRUBIN URINE: NEGATIVE
Glucose, UA: NEGATIVE mg/dL
Ketones, ur: 15 mg/dL — AB
Nitrite: NEGATIVE
Protein, ur: 100 mg/dL — AB
Specific Gravity, Urine: 1.012 (ref 1.005–1.030)
UROBILINOGEN UA: 0.2 mg/dL (ref 0.0–1.0)
pH: 5.5 (ref 5.0–8.0)

## 2015-04-19 LAB — RENAL FUNCTION PANEL
ANION GAP: 9 (ref 5–15)
Albumin: 2.3 g/dL — ABNORMAL LOW (ref 3.5–5.0)
BUN: 62 mg/dL — ABNORMAL HIGH (ref 6–20)
CO2: 22 mmol/L (ref 22–32)
Calcium: 7.2 mg/dL — ABNORMAL LOW (ref 8.9–10.3)
Chloride: 108 mmol/L (ref 101–111)
Creatinine, Ser: 3.48 mg/dL — ABNORMAL HIGH (ref 0.61–1.24)
GFR, EST AFRICAN AMERICAN: 19 mL/min — AB (ref >60)
GFR, EST NON AFRICAN AMERICAN: 16 mL/min — AB (ref >60)
Glucose, Bld: 116 mg/dL — ABNORMAL HIGH (ref 65–99)
PHOSPHORUS: 3.5 mg/dL (ref 2.5–4.6)
POTASSIUM: 2.8 mmol/L — AB (ref 3.5–5.1)
SODIUM: 139 mmol/L (ref 135–145)

## 2015-04-19 LAB — PSA: PSA: 144 ng/mL — ABNORMAL HIGH (ref 0.00–4.00)

## 2015-04-19 LAB — ALKALINE PHOSPHATASE: ALK PHOS: 59 U/L (ref 38–126)

## 2015-04-19 LAB — URINE MICROSCOPIC-ADD ON

## 2015-04-19 LAB — HEPARIN LEVEL (UNFRACTIONATED)
HEPARIN UNFRACTIONATED: 0.81 [IU]/mL — AB (ref 0.30–0.70)
HEPARIN UNFRACTIONATED: 0.77 [IU]/mL — AB (ref 0.30–0.70)

## 2015-04-19 MED ORDER — POTASSIUM CHLORIDE CRYS ER 20 MEQ PO TBCR
40.0000 meq | EXTENDED_RELEASE_TABLET | Freq: Once | ORAL | Status: AC
  Administered 2015-04-19: 40 meq via ORAL
  Filled 2015-04-19: qty 2

## 2015-04-19 MED ORDER — HEPARIN (PORCINE) IN NACL 100-0.45 UNIT/ML-% IJ SOLN
1800.0000 [IU]/h | INTRAMUSCULAR | Status: DC
  Administered 2015-04-19 – 2015-04-20 (×3): 1700 [IU]/h via INTRAVENOUS
  Administered 2015-04-21: 1800 [IU]/h via INTRAVENOUS
  Filled 2015-04-19 (×6): qty 250

## 2015-04-19 NOTE — Progress Notes (Signed)
Patient is transferred to [address] per wheelchair.  Ms. [name], pt's wife was notified of this transfer

## 2015-04-19 NOTE — Progress Notes (Signed)
Ref: No primary care provider on file.   Subjective:  Feeling better. Hematuria continues.  Objective:  Vital Signs in the last 24 hours: Temp:  [97.6 F (36.4 C)-98.3 F (36.8 C)] 97.9 F (36.6 C) (07/30 1017) Pulse Rate:  [54-80] 80 (07/30 1017) Cardiac Rhythm:  [-] Normal sinus rhythm (07/30 0815) Resp:  [13-28] 22 (07/30 1017) BP: (113-156)/(59-88) 156/88 mmHg (07/30 1017) SpO2:  [97 %-100 %] 100 % (07/30 1017) Weight:  [87.7 kg (193 lb 5.5 oz)] 87.7 kg (193 lb 5.5 oz) (07/30 0400)  Physical Exam: BP Readings from Last 1 Encounters:  04/19/15 156/88    Wt Readings from Last 1 Encounters:  04/19/15 87.7 kg (193 lb 5.5 oz)    Weight change: 5 kg (11 lb 0.4 oz)  HEENT: [place]/AT, Eyes- PERL, EOMI, Conjunctiva-Pink, Sclera-Non-icteric Neck: No JVD, No bruit, Trachea midline. Lungs:  Clearing, Bilateral. Cardiac:  Regular rhythm, normal S1 and S2, no S3.  Abdomen:  Soft, non-tender. Extremities:  No edema present. No cyanosis. No clubbing. CNS: AxOx3, Cranial nerves grossly intact, moves all 4 extremities. Right handed. Skin: Warm and dry.   Intake/Output from previous day: 07/29 0701 - 07/30 0700 In: 1837 [P.O.:480; I.V.:1357] Out: 3850 [Urine:3850]    Lab Results: BMET    Component Value Date/Time   NA 139 04/19/2015 0234   NA 141 04/18/2015 0229   NA 140 04/17/2015 0234   K 2.8* 04/19/2015 0234   K 3.2* 04/18/2015 0229   K 3.6 04/17/2015 0234   CL 108 04/19/2015 0234   CL 107 04/18/2015 0229   CL 108 04/17/2015 0234   CO2 22 04/19/2015 0234   CO2 21* 04/18/2015 0229   CO2 19* 04/17/2015 0234   GLUCOSE 116* 04/19/2015 0234   GLUCOSE 133* 04/18/2015 0229   GLUCOSE 131* 04/17/2015 0234   BUN 62* 04/19/2015 0234   BUN 80* 04/18/2015 0229   BUN 92* 04/17/2015 0234   CREATININE 3.48* 04/19/2015 0234   CREATININE 4.54* 04/18/2015 0229   CREATININE 5.43* 04/17/2015 0234   CALCIUM 7.2* 04/19/2015 0234   CALCIUM 8.3* 04/18/2015 0229   CALCIUM 8.3* 04/17/2015  0234   GFRNONAA 16* 04/19/2015 0234   GFRNONAA 12* 04/18/2015 0229   GFRNONAA 10* 04/17/2015 0234   GFRAA 19* 04/19/2015 0234   GFRAA 14* 04/18/2015 0229   GFRAA 11* 04/17/2015 0234   CBC    Component Value Date/Time   WBC 7.6 04/19/2015 0234   RBC 2.35* 04/19/2015 0234   RBC 2.10* 04/14/2015 2218   HGB 7.4* 04/19/2015 0234   HCT 21.8* 04/19/2015 0234   PLT 166 04/19/2015 0234   MCV 92.8 04/19/2015 0234   MCH 31.5 04/19/2015 0234   MCHC 33.9 04/19/2015 0234   RDW 13.1 04/19/2015 0234   LYMPHSABS 0.3* 04/15/2015 0355   MONOABS 0.4 04/15/2015 0355   EOSABS 0.0 04/15/2015 0355   BASOSABS 0.0 04/15/2015 0355   HEPATIC Function Panel  Recent Labs  04/14/15 1511 04/15/15 0355  PROT 8.0 6.2*   HEMOGLOBIN A1C No components found for: HGA1C,  MPG CARDIAC ENZYMES Lab Results  Component Value Date   TROPONINI 0.14* 04/15/2015   TROPONINI 0.18* 04/15/2015   TROPONINI 0.19* 04/14/2015   BNP No results for input(s): PROBNP in the last 8760 hours. TSH  Recent Labs  04/14/15 2218 04/15/15 0355  TSH 1.125 0.877   CHOLESTEROL  Recent Labs  04/15/15 0355  CHOL 136    Scheduled Meds: . amiodarone  200 mg Oral BID  . antiseptic  oral rinse  7 mL Mouth Rinse BID  . aspirin  81 mg Oral Daily  . cefTRIAXone (ROCEPHIN)  IV  2 g Intravenous Q24H  . diltiazem  60 mg Oral 4 times per day  . feeding supplement (ENSURE ENLIVE)  237 mL Oral BID BM  . sodium chloride  3 mL Intravenous Q12H   Continuous Infusions: . heparin 1,900 Units/hr (04/19/15 0659)  . sodium chloride 0.9 % 1,000 mL infusion 75 mL/hr at 04/18/15 2239   PRN Meds:.acetaminophen **OR** acetaminophen, ondansetron **OR** ondansetron (ZOFRAN) IV  Assessment/Plan: Recurrent A. fib /atrial flutter with RVR (chadsVasc score of 3) Probable very small type II MI secondary to demand ischemia/abnormal EKG rule out coronary insufficiency Hypertension New-onset diabetes, II Hypercholesteremia Community acquired  left pneumonia UTI Probable acute on chronic kidney injury secondary to obstructive uropathy Dehydration Anemia of chronic disease Status post rectal bleeding  Awaiting urology evaluation. Continue medical treatment.     LOS: 5 days    Dixie Dials  MD  04/19/2015, 11:29 AM

## 2015-04-19 NOTE — Progress Notes (Signed)
ANTICOAGULATION CONSULT NOTE - Follow Up Consult  Pharmacy Consult for heparin Indication: atrial fibrillation   Labs:  Recent Labs  04/17/15 0234  04/18/15 0229 04/18/15 0500 04/19/15 0234 04/19/15 0510  HGB 8.7*  --   --  8.8* 7.4*  --   HCT 25.1*  --   --  25.6* 21.8*  --   PLT 139*  --   --  160 166  --   HEPARINUNFRC 0.26*  < > 0.59  --  >2.20* 0.81*  CREATININE 5.43*  --  4.54*  --  3.48*  --   < > = values in this interval not displayed.    Assessment: 74yo male supratherapeutic on heparin after two levels at goal though had been trending up (level of >2.2 was erroneous, on recheck level was 0.81).  Goal of Therapy:  Heparin level 0.3-0.7 units/ml   Plan:  Will decrease heparin gtt by 2-3 units/kg/hr to 1900 units/hr and check level in [place], PharmD, BCPS  04/19/2015,6:11 AM

## 2015-04-19 NOTE — Progress Notes (Signed)
[facility] PROGRESS NOTE  Anterio W Lowella Grip. [id number] DOB: 30-Dec-1940 DOA: 04/14/2015  PCP: No primary care provider on file.  Brief HPI: 74 year old Caucasian male with a past medical history of hypertension who denies taking any medications at home, presented with complaints of weakness and fatigue. He was found to be in atrial fibrillation with RVR. He had a temperature of 102F, likely secondary to pneumonia. He also was noted to be in renal failure. Patient was hospitalized for further management. Patient was seen by cardiology. He was placed on a Cardizem infusion. He was also started on amiodarone. Renal function started improving.  Past medical history:  Past Medical History  Diagnosis Date  . Hypertension     Consultants: Cardiology, Dr. Terrence Dupont. Nephrology. Urology  Procedures:  2-D echocardiogram Study Conclusions - Left ventricle: The cavity size was normal. Wall thickness wasincreased in a pattern of mild LVH. Systolic function was normal.The estimated ejection fraction was in the range of 55% to 60%.Wall motion was normal; there were no regional wall motionabnormalities. Left ventricular diastolic function parameterswere normal. - Aortic valve: There was trivial regurgitation. - Atrial septum: No defect or patent foramen ovale was identified.  Antibiotics: Zosyn till 7/28 Ceftriaxone 7/28  Subjective: Patient denies any complaints. Patient's nurse pointed out that the urine in the Foley catheter is blood-tinged. Unclear when this started. Patient denies any chest pain or shortness of breath. No nausea or vomiting.   Objective: Vital Signs  Filed Vitals:   04/18/15 1952 04/18/15 2355 04/19/15 0000 04/19/15 0400  BP: 137/66  156/76 138/70  Pulse: 59     Temp: 97.9 F (36.6 C) 97.6 F (36.4 C)  98.3 F (36.8 C)  TempSrc: Oral Oral  Axillary  Resp: 20  24 13   Height:      Weight:    87.7 kg (193 lb 5.5 oz)  SpO2: 97%  97% 97%     Intake/Output Summary (Last 24 hours) at 04/19/15 0737 Last data filed at 04/19/15 0700  Gross per 24 hour  Intake 1686.97 ml  Output   3850 ml  Net -2163.03 ml   Filed Weights   04/17/15 0500 04/18/15 0500 04/19/15 0400  Weight: 86.1 kg (189 lb 13.1 oz) 82.7 kg (182 lb 5.1 oz) 87.7 kg (193 lb 5.5 oz)    General appearance: alert, cooperative, appears stated age and no distress Resp: Improved air entry without any crackles or wheezing. Cardio: S1, S2 irregularly irregular. No S3, S4. No rubs, murmurs, or bruit. No pedal edema. GI: soft, non-tender; bowel sounds normal; no masses,  no organomegaly Extremities: extremities normal, atraumatic, no cyanosis or edema Neurologic: No focal deficits.  Lab Results:  Basic Metabolic Panel:  Recent Labs Lab 04/14/15 2218 04/15/15 0355 04/16/15 0249 04/17/15 0234 04/18/15 0229 04/19/15 0234  NA  --  132* 137 140 141 139  K  --  4.4 4.0 3.6 3.2* 2.8*  CL  --  100* 106 108 107 108  CO2  --  19* 16* 19* 21* 22  GLUCOSE  --  141* 118* 131* 133* 116*  BUN  --  84* 94* 92* 80* 62*  CREATININE  --  6.29* 6.45* 5.43* 4.54* 3.48*  CALCIUM  --  8.4* 8.3* 8.3* 8.3* 7.2*  PHOS 4.2  --  6.7* 5.7* 5.0* 3.5   Liver Function Tests:  Recent Labs Lab 04/14/15 1511 04/15/15 0355 04/16/15 0249 04/17/15 0234 04/18/15 0229 04/19/15 0234  AST 22 17  --   --   --   --  ALT 11* 13*  --   --   --   --   ALKPHOS 59 53  --   --   --   --   BILITOT 0.7 0.5  --   --   --   --   PROT 8.0 6.2*  --   --   --   --   ALBUMIN 3.9 3.0* 2.8* 2.4* 2.5* 2.3*   CBC:  Recent Labs Lab 04/15/15 0355 04/16/15 0249 04/17/15 0234 04/18/15 0500 04/19/15 0234  WBC 7.4 6.0 5.2 7.2 7.6  NEUTROABS 6.8  --   --   --   --   HGB 9.0* 9.0* 8.7* 8.8* 7.4*  HCT 25.7* 26.3* 25.1* 25.6* 21.8*  MCV 92.8 93.6 93.0 92.1 92.8  PLT 122* 129* 139* 160 166   Cardiac Enzymes:  Recent Labs Lab 04/14/15 1511 04/14/15 2218 04/15/15 0355 04/15/15 0921  TROPONINI  0.16* 0.19* 0.18* 0.14*    Recent Results (from the past 240 hour(s))  Blood culture (routine x 2)     Status: None   Collection Time: 04/14/15  5:15 PM  Result Value Ref Range Status   Specimen Description BLOOD LEFT ANTECUBITAL  Final   Special Requests BOTTLES DRAWN AEROBIC AND ANAEROBIC 5CC EACH  Final   Culture  Setup Time   Final    GRAM NEGATIVE RODS IN BOTH AEROBIC AND ANAEROBIC BOTTLES CRITICAL RESULT CALLED TO, READ BACK BY AND VERIFIED WITH: A AGUIRRE,RN AT [id number] 04/15/15 BY L BENFIELD    Culture   Final    ESCHERICHIA COLI SUSCEPTIBILITIES PERFORMED ON PREVIOUS CULTURE WITHIN THE LAST 5 DAYS. Performed at [facility]    Report Status 04/17/2015 FINAL  Final  Blood culture (routine x 2)     Status: None   Collection Time: 04/14/15  5:30 PM  Result Value Ref Range Status   Specimen Description BLOOD RIGHT ARM  Final   Special Requests BOTTLES DRAWN AEROBIC ONLY 4CC  Final   Culture  Setup Time   Final    GRAM NEGATIVE RODS AEROBIC BOTTLE ONLY CRITICAL RESULT CALLED TO, READ BACK BY AND VERIFIED WITH: C WHITE,RN AT 1055 04/15/15 BY L BENFIELD    Culture   Final    ESCHERICHIA COLI Performed at [facility]    Report Status 04/17/2015 FINAL  Final   Organism ID, Bacteria ESCHERICHIA COLI  Final      Susceptibility   Escherichia coli - MIC*    AMPICILLIN <=2 SENSITIVE Sensitive     CEFAZOLIN <=4 SENSITIVE Sensitive     CEFEPIME <=1 SENSITIVE Sensitive     CEFTAZIDIME <=1 SENSITIVE Sensitive     CEFTRIAXONE <=1 SENSITIVE Sensitive     CIPROFLOXACIN <=0.25 SENSITIVE Sensitive     GENTAMICIN <=1 SENSITIVE Sensitive     IMIPENEM <=0.25 SENSITIVE Sensitive     TRIMETH/SULFA <=20 SENSITIVE Sensitive     AMPICILLIN/SULBACTAM <=2 SENSITIVE Sensitive     PIP/TAZO <=4 SENSITIVE Sensitive     * ESCHERICHIA COLI  MRSA PCR Screening     Status: None   Collection Time: 04/14/15  9:50 PM  Result Value Ref Range Status   MRSA by PCR NEGATIVE NEGATIVE Final     Comment:        The GeneXpert MRSA Assay (FDA approved for NASAL specimens only), is one component of a comprehensive MRSA colonization surveillance program. It is not intended to diagnose MRSA infection nor to guide or monitor treatment for MRSA infections.  Culture, Urine     Status: None   Collection Time: 04/17/15  8:13 AM  Result Value Ref Range Status   Specimen Description URINE, CATHETERIZED  Final   Special Requests NONE  Final   Culture NO GROWTH 1 DAY  Final   Report Status 04/18/2015 FINAL  Final      Studies/Results: No results found.  Medications:  Scheduled: . amiodarone  200 mg Oral BID  . antiseptic oral rinse  7 mL Mouth Rinse BID  . aspirin  81 mg Oral Daily  . cefTRIAXone (ROCEPHIN)  IV  2 g Intravenous Q24H  . diltiazem  60 mg Oral 4 times per day  . feeding supplement (ENSURE ENLIVE)  237 mL Oral BID BM  . potassium chloride  40 mEq Oral Once  . sodium chloride  3 mL Intravenous Q12H   Continuous: . heparin 1,900 Units/hr (04/19/15 0659)  . sodium chloride 0.9 % 1,000 mL infusion 75 mL/hr at 04/18/15 2239   [id number] **OR** acetaminophen, ondansetron **OR** ondansetron (ZOFRAN) IV  Assessment/Plan:  Principal Problem:   Sepsis Active Problems:   Atrial fibrillation with RVR   CAP (community acquired pneumonia)   Essential hypertension   Elevated troponin   Normocytic hypochromic anemia   CKD (chronic kidney disease) stage 4, GFR 15-29 ml/min   Gram-negative bacteremia   AKI (acute kidney injury)   Acute renal failure    Sepsis with Escherichia coli bacteremia Source is likely UTI. Unfortunately, urine culture was not sent at the time of admission. Chest x-ray also suggested pneumonia. Patient was initially started on ceftriaxone and azithromycin. Was changed over to [facility] 7/26. Blood cultures growing Escherichia coli. Sensitivities have been noted. Patient changed over to ceftriaxone. Noted to have high pro-calcitonin levels.  Lactic acid level was normal. Hemodynamically stable. We will transition to oral antibiotics in the next 1-2 days.  Acute renal failure with bilateral hydronephrosis Continues to show improvement in creatinine. Has good urine output. Nephrology has signed off. Renal ultrasound showed bilateral hydronephrosis. Case had been discussed with urology as well.  Continue IV fluids. Patient was taking NSAIDs at home.   Hematuria Plan was to continue Foley catheter at the time of discharge and outpatient follow-up. And to place the patient on Flomax at the time of discharge. However, he has developed hematuria. Since he will need to be on anticoagulation, he will need urological input before he is discharged. Have consulted urology. Check UA.  Anemia of chronic disease Hemoglobin noted to be lower than yesterday. Could be secondary to hematuria. Repeat hemoglobin later today and transfuse as needed. Patient mentioned that he may have had a black stool this morning. Discussed with the nurse. Apparently the stool was normal. We will continue to monitor for now.  Atrial fibrillation/atrial flutter with RVR Being managed by cardiology. Patient is on amiodarone as well as oral Cardizem now. Heart rate is improved. Actually appears to be in sinus rhythm this morning. We will need to initiate warfarin once hematuria issue is addressed.   Questionable left lower lobe pneumonia Will need follow-up chest x-ray in 4-6 weeks. Continue antibiotics.  Elevated troponin, likely secondary to demand ischemia Multifactorial due to sepsis, acute renal failure, atrial fibrillation. No wall motion abnormality noted on echocardiogram. Cardiology is following. Continue aspirin.   DVT Prophylaxis: On IV heparin    Code Status: DO NOT RESUSCITATE  Family Communication: Discussed with the patient and his wife Disposition Plan: Will remain in step down for now.   Follow-up  Appointment?: He will need a PCP.   LOS: 5 days    [facility] Pager [phone] 04/19/2015, 7:37 AM  If 7PM-7AM, please contact night-coverage at [email or web address], password [facility]

## 2015-04-19 NOTE — Consult Note (Signed)
Urology Consult  Referring physician:  Bonnielee Haff, MD Reason for referral:  Gross hematuria  Chief Complaint:  Bloody urine  History of Present Illness:   : 74 year old Caucasian male with a past medical history of hypertension who denies taking any medications at home, presented with complaints of weakness and fatigue. He was found to be in atrial fibrillation with RVR. He had a temperature of 102F, likely secondary to pneumonia. He also was noted to be in renal failure. Patient was hospitalized for further management. Patient was seen by cardiology. He was placed on a Cardizem infusion. He was also started on amiodarone. Renal function started improving. Originally seen by Dr. Matilde Sprang with : Last 48 hrs:  A. Fib ARF UTI/sepsis Bilateral hydronephrosis 2ndary BPH  CLINICAL DATA: Acute renal failure. History of diabetes and hypertension.  EXAM: RENAL / URINARY TRACT ULTRASOUND COMPLETE  COMPARISON: CT abdomen pelvis- 04/17/2015 ; 01/14/2015; 10/16/2013 ultrasound-guided right renal biopsy -04/17/2015  FINDINGS: Right Kidney:  Normal cortical thickness, echogenicity and size, measuring 11.6 cm in length. No focal renal lesions. No echogenic renal stones. Re- demonstrated mild right-sided pelvicaliectasis (representative images 16 and 17).  Left Kidney:  Normal cortical thickness, echogenicity and size, measuring 10.3 cm in length. No focal renal lesions. No echogenic renal stones. No urinary obstruction.  Bladder:  There is minimal mass effect upon the undersurface of the urinary bladder. Otherwise, normal appearance of the urinary bladder given degree distention. A right-sided ureteral jet is identified. A left-sided ureteral jet is not definitively seen.  Cranial to the superior pole the right kidney is a approximately 5.2 x 4.6 cm echogenic mass compatible with the known right adrenal myelolipoma demonstrated a multiple prior abdominal CT  scans.  IMPRESSION: 1. Persistent mild to moderate right-sided pelvicaliectasis, similar to postprocedural abdominal CT obtained 04/17/2015. A right-sided ureteral jet is identified. 2. No evidence of left-sided urinary obstruction. 3. Echogenic approximately 5.2 cm mass superior to the right kidney compatible with the known right-sided adrenal myelolipoma.     Past Medical History  Diagnosis Date  . Hypertension    Past Surgical History  Procedure Laterality Date  . Tonsillectomy      Medications: I have reviewed the patient's current medications. Allergies: No Known Allergies  Family History  Problem Relation Age of Onset  . Breast cancer Mother    Social History:  reports that he has never smoked. He does not have any smokeless tobacco history on file. He reports that he does not drink alcohol or use illicit drugs.  ROS: All systems are reviewed and negative except as noted. Admits to nocturia x 5, urge and urge incontinence ( wears Depends); urinary frequency, urgency, and dribbling. Saw Dr. Serita Butcher several years ago, but does not remember taking any medicine.   Physical Exam:  Vital signs in last 24 hours: Temp:  [97.6 F (36.4 C)-98.3 F (36.8 C)] 97.9 F (36.6 C) (07/30 1017) Pulse Rate:  [59-80] 80 (07/30 1017) Resp:  [13-28] 22 (07/30 1017) BP: (128-156)/(66-88) 156/88 mmHg (07/30 1017) SpO2:  [97 %-100 %] 100 % (07/30 1017) Weight:  [87.7 kg (193 lb 5.5 oz)] 87.7 kg (193 lb 5.5 oz) (07/30 0400)  Cardiovascular: Skin warm; not flushed Respiratory: Breaths quiet; no shortness of breath Abdomen: No masses Neurological: Normal sensation to touch Musculoskeletal: Normal motor function arms and legs Lymphatics: No inguinal adenopathy Skin: No rashes Genitourinary: Rectal: decreased Sphincter tone: prostate 2-3+. No masses. No blood.   Laboratory Data:  Results for orders placed or performed  during the hospital encounter of 04/14/15 (from the past 72  hour(s))  Heparin level (unfractionated)     Status: None   Collection Time: 04/16/15 10:04 PM  Result Value Ref Range   Heparin Unfractionated 0.36 0.30 - 0.70 IU/mL    Comment:        IF HEPARIN RESULTS ARE BELOW EXPECTED VALUES, AND PATIENT DOSAGE HAS BEEN CONFIRMED, SUGGEST FOLLOW UP TESTING OF ANTITHROMBIN III LEVELS.   Heparin level (unfractionated)     Status: Abnormal   Collection Time: 04/17/15  2:34 AM  Result Value Ref Range   Heparin Unfractionated 0.26 (L) 0.30 - 0.70 IU/mL    Comment:        IF HEPARIN RESULTS ARE BELOW EXPECTED VALUES, AND PATIENT DOSAGE HAS BEEN CONFIRMED, SUGGEST FOLLOW UP TESTING OF ANTITHROMBIN III LEVELS.   CBC     Status: Abnormal   Collection Time: 04/17/15  2:34 AM  Result Value Ref Range   WBC 5.2 4.0 - 10.5 K/uL   RBC 2.70 (L) 4.22 - 5.81 MIL/uL   Hemoglobin 8.7 (L) 13.0 - 17.0 g/dL   HCT 25.1 (L) 39.0 - 52.0 %   MCV 93.0 78.0 - 100.0 fL   MCH 32.2 26.0 - 34.0 pg   MCHC 34.7 30.0 - 36.0 g/dL   RDW 13.5 11.5 - 15.5 %   Platelets 139 (L) 150 - 400 K/uL  Renal function panel     Status: Abnormal   Collection Time: 04/17/15  2:34 AM  Result Value Ref Range   Sodium 140 135 - 145 mmol/L   Potassium 3.6 3.5 - 5.1 mmol/L   Chloride 108 101 - 111 mmol/L   CO2 19 (L) 22 - 32 mmol/L   Glucose, Bld 131 (H) 65 - 99 mg/dL   BUN 92 (H) 6 - 20 mg/dL   Creatinine, Ser 5.43 (H) 0.61 - 1.24 mg/dL   Calcium 8.3 (L) 8.9 - 10.3 mg/dL   Phosphorus 5.7 (H) 2.5 - 4.6 mg/dL   Albumin 2.4 (L) 3.5 - 5.0 g/dL   GFR calc non Af Amer 10 (L) >60 mL/min   GFR calc Af Amer 11 (L) >60 mL/min    Comment: (NOTE) The eGFR has been calculated using the CKD EPI equation. This calculation has not been validated in all clinical situations. eGFR's persistently <60 mL/min signify possible Chronic Kidney Disease.    Anion gap 13 5 - 15  Culture, Urine     Status: None   Collection Time: 04/17/15  8:13 AM  Result Value Ref Range   Specimen Description URINE,  CATHETERIZED    Special Requests NONE    Culture NO GROWTH 1 DAY    Report Status 04/18/2015 FINAL   Heparin level (unfractionated)     Status: None   Collection Time: 04/17/15 12:34 PM  Result Value Ref Range   Heparin Unfractionated 0.31 0.30 - 0.70 IU/mL    Comment:        IF HEPARIN RESULTS ARE BELOW EXPECTED VALUES, AND PATIENT DOSAGE HAS BEEN CONFIRMED, SUGGEST FOLLOW UP TESTING OF ANTITHROMBIN III LEVELS.   Heparin level (unfractionated)     Status: None   Collection Time: 04/18/15  2:29 AM  Result Value Ref Range   Heparin Unfractionated 0.59 0.30 - 0.70 IU/mL    Comment:        IF HEPARIN RESULTS ARE BELOW EXPECTED VALUES, AND PATIENT DOSAGE HAS BEEN CONFIRMED, SUGGEST FOLLOW UP TESTING OF ANTITHROMBIN III LEVELS.   Renal function panel  Status: Abnormal   Collection Time: 04/18/15  2:29 AM  Result Value Ref Range   Sodium 141 135 - 145 mmol/L   Potassium 3.2 (L) 3.5 - 5.1 mmol/L   Chloride 107 101 - 111 mmol/L   CO2 21 (L) 22 - 32 mmol/L   Glucose, Bld 133 (H) 65 - 99 mg/dL   BUN 80 (H) 6 - 20 mg/dL   Creatinine, Ser 4.54 (H) 0.61 - 1.24 mg/dL   Calcium 8.3 (L) 8.9 - 10.3 mg/dL   Phosphorus 5.0 (H) 2.5 - 4.6 mg/dL   Albumin 2.5 (L) 3.5 - 5.0 g/dL   GFR calc non Af Amer 12 (L) >60 mL/min   GFR calc Af Amer 14 (L) >60 mL/min    Comment: (NOTE) The eGFR has been calculated using the CKD EPI equation. This calculation has not been validated in all clinical situations. eGFR's persistently <60 mL/min signify possible Chronic Kidney Disease.    Anion gap 13 5 - 15  CBC     Status: Abnormal   Collection Time: 04/18/15  5:00 AM  Result Value Ref Range   WBC 7.2 4.0 - 10.5 K/uL   RBC 2.78 (L) 4.22 - 5.81 MIL/uL   Hemoglobin 8.8 (L) 13.0 - 17.0 g/dL   HCT 25.6 (L) 39.0 - 52.0 %   MCV 92.1 78.0 - 100.0 fL   MCH 31.7 26.0 - 34.0 pg   MCHC 34.4 30.0 - 36.0 g/dL   RDW 13.2 11.5 - 15.5 %   Platelets 160 150 - 400 K/uL  Heparin level (unfractionated)      Status: Abnormal   Collection Time: 04/19/15  2:34 AM  Result Value Ref Range   Heparin Unfractionated >2.20 (H) 0.30 - 0.70 IU/mL    Comment:        IF HEPARIN RESULTS ARE BELOW EXPECTED VALUES, AND PATIENT DOSAGE HAS BEEN CONFIRMED, SUGGEST FOLLOW UP TESTING OF ANTITHROMBIN III LEVELS.   CBC     Status: Abnormal   Collection Time: 04/19/15  2:34 AM  Result Value Ref Range   WBC 7.6 4.0 - 10.5 K/uL   RBC 2.35 (L) 4.22 - 5.81 MIL/uL   Hemoglobin 7.4 (L) 13.0 - 17.0 g/dL   HCT 21.8 (L) 39.0 - 52.0 %   MCV 92.8 78.0 - 100.0 fL   MCH 31.5 26.0 - 34.0 pg   MCHC 33.9 30.0 - 36.0 g/dL   RDW 13.1 11.5 - 15.5 %   Platelets 166 150 - 400 K/uL  Renal function panel     Status: Abnormal   Collection Time: 04/19/15  2:34 AM  Result Value Ref Range   Sodium 139 135 - 145 mmol/L   Potassium 2.8 (L) 3.5 - 5.1 mmol/L   Chloride 108 101 - 111 mmol/L   CO2 22 22 - 32 mmol/L   Glucose, Bld 116 (H) 65 - 99 mg/dL   BUN 62 (H) 6 - 20 mg/dL   Creatinine, Ser 3.48 (H) 0.61 - 1.24 mg/dL   Calcium 7.2 (L) 8.9 - 10.3 mg/dL   Phosphorus 3.5 2.5 - 4.6 mg/dL   Albumin 2.3 (L) 3.5 - 5.0 g/dL   GFR calc non Af Amer 16 (L) >60 mL/min   GFR calc Af Amer 19 (L) >60 mL/min    Comment: (NOTE) The eGFR has been calculated using the CKD EPI equation. This calculation has not been validated in all clinical situations. eGFR's persistently <60 mL/min signify possible Chronic Kidney Disease.    Anion gap 9  5 - 15  Heparin level (unfractionated)     Status: Abnormal   Collection Time: 04/19/15  5:10 AM  Result Value Ref Range   Heparin Unfractionated 0.81 (H) 0.30 - 0.70 IU/mL    Comment:        IF HEPARIN RESULTS ARE BELOW EXPECTED VALUES, AND PATIENT DOSAGE HAS BEEN CONFIRMED, SUGGEST FOLLOW UP TESTING OF ANTITHROMBIN III LEVELS.   Urinalysis, Routine w reflex microscopic (not at [facility])     Status: Abnormal   Collection Time: 04/19/15 10:30 AM  Result Value Ref Range   Color, Urine RED (A) YELLOW     Comment: BIOCHEMICALS MAY BE AFFECTED BY COLOR   APPearance TURBID (A) CLEAR   Specific Gravity, Urine 1.012 1.005 - 1.030   pH 5.5 5.0 - 8.0   Glucose, UA NEGATIVE NEGATIVE mg/dL   Hgb urine dipstick LARGE (A) NEGATIVE   Bilirubin Urine NEGATIVE NEGATIVE   Ketones, ur 15 (A) NEGATIVE mg/dL   Protein, ur 100 (A) NEGATIVE mg/dL   Urobilinogen, UA 0.2 0.0 - 1.0 mg/dL   Nitrite NEGATIVE NEGATIVE   Leukocytes, UA MODERATE (A) NEGATIVE  Urine microscopic-add on     Status: Abnormal   Collection Time: 04/19/15 10:30 AM  Result Value Ref Range   Squamous Epithelial / LPF FEW (A) RARE   WBC, UA 11-20 <3 WBC/hpf   RBC / HPF TOO NUMEROUS TO COUNT <3 RBC/hpf   Bacteria, UA FEW (A) RARE  Heparin level (unfractionated)     Status: Abnormal   Collection Time: 04/19/15  2:28 PM  Result Value Ref Range   Heparin Unfractionated 0.77 (H) 0.30 - 0.70 IU/mL    Comment:        IF HEPARIN RESULTS ARE BELOW EXPECTED VALUES, AND PATIENT DOSAGE HAS BEEN CONFIRMED, SUGGEST FOLLOW UP TESTING OF ANTITHROMBIN III LEVELS.   CBC     Status: Abnormal   Collection Time: 04/19/15  2:28 PM  Result Value Ref Range   WBC 8.6 4.0 - 10.5 K/uL   RBC 2.94 (L) 4.22 - 5.81 MIL/uL   Hemoglobin 9.3 (L) 13.0 - 17.0 g/dL    Comment: REPEATED TO VERIFY   HCT 27.8 (L) 39.0 - 52.0 %   MCV 94.6 78.0 - 100.0 fL   MCH 31.6 26.0 - 34.0 pg   MCHC 33.5 30.0 - 36.0 g/dL   RDW 13.0 11.5 - 15.5 %   Platelets 188 150 - 400 K/uL   Recent Results (from the past 240 hour(s))  Blood culture (routine x 2)     Status: None   Collection Time: 04/14/15  5:15 PM  Result Value Ref Range Status   Specimen Description BLOOD LEFT ANTECUBITAL  Final   Special Requests BOTTLES DRAWN AEROBIC AND ANAEROBIC 5CC EACH  Final   Culture  Setup Time   Final    GRAM NEGATIVE RODS IN BOTH AEROBIC AND ANAEROBIC BOTTLES CRITICAL RESULT CALLED TO, READ BACK BY AND VERIFIED WITH: A AGUIRRE,RN AT [id number] 04/15/15 BY L BENFIELD    Culture   Final     ESCHERICHIA COLI SUSCEPTIBILITIES PERFORMED ON PREVIOUS CULTURE WITHIN THE LAST 5 DAYS. Performed at [facility]    Report Status 04/17/2015 FINAL  Final  Blood culture (routine x 2)     Status: None   Collection Time: 04/14/15  5:30 PM  Result Value Ref Range Status   Specimen Description BLOOD RIGHT ARM  Final   Special Requests BOTTLES DRAWN AEROBIC ONLY 4CC  Final  Culture  Setup Time   Final    GRAM NEGATIVE RODS AEROBIC BOTTLE ONLY CRITICAL RESULT CALLED TO, READ BACK BY AND VERIFIED WITH: C WHITE,RN AT [id number] 04/15/15 BY L BENFIELD    Culture   Final    ESCHERICHIA COLI Performed at [facility]    Report Status 04/17/2015 FINAL  Final   Organism ID, Bacteria ESCHERICHIA COLI  Final      Susceptibility   Escherichia coli - MIC*    AMPICILLIN <=2 SENSITIVE Sensitive     CEFAZOLIN <=4 SENSITIVE Sensitive     CEFEPIME <=1 SENSITIVE Sensitive     CEFTAZIDIME <=1 SENSITIVE Sensitive     CEFTRIAXONE <=1 SENSITIVE Sensitive     CIPROFLOXACIN <=0.25 SENSITIVE Sensitive     GENTAMICIN <=1 SENSITIVE Sensitive     IMIPENEM <=0.25 SENSITIVE Sensitive     TRIMETH/SULFA <=20 SENSITIVE Sensitive     AMPICILLIN/SULBACTAM <=2 SENSITIVE Sensitive     PIP/TAZO <=4 SENSITIVE Sensitive     * ESCHERICHIA COLI  MRSA PCR Screening     Status: None   Collection Time: 04/14/15  9:50 PM  Result Value Ref Range Status   MRSA by PCR NEGATIVE NEGATIVE Final    Comment:        The GeneXpert MRSA Assay (FDA approved for NASAL specimens only), is one component of a comprehensive MRSA colonization surveillance program. It is not intended to diagnose MRSA infection nor to guide or monitor treatment for MRSA infections.   Culture, Urine     Status: None   Collection Time: 04/17/15  8:13 AM  Result Value Ref Range Status   Specimen Description URINE, CATHETERIZED  Final   Special Requests NONE  Final   Culture NO GROWTH 1 DAY  Final   Report Status 04/18/2015 FINAL  Final    Creatinine:  Recent Labs  04/14/15 1511 04/15/15 0355 04/16/15 0249 04/17/15 0234 04/18/15 0229 04/19/15 0234  CREATININE 5.33* 6.29* 6.45* 5.43* 4.54* 3.48*    Xrays: ULTRASOUND ABDOMEN COMPLETE  COMPARISON: None.  FINDINGS: Gallbladder: No gallstones or wall thickening visualized. No sonographic Murphy sign noted.  Common bile duct: Diameter: 4.1 mm.  Liver: Mild increased echogenicity may represent mild fatty infiltration without mass identified.  IVC: No abnormality visualized.  Pancreas: Poorly delineated secondary to bowel gas.  Spleen: Size and appearance within normal limits.  Right Kidney: Length: 14.3 cm. Hydronephrosis. No obvious mass.  Left Kidney: Length: 14.5 cm. Hydronephrosis. No obvious mass.  Abdominal aorta: Poorly delineated secondary to bowel gas. Proximal aspect measures up to 3 cm.  Other findings: None.  IMPRESSION: Bilateral prominent hydronephrosis. Etiology indeterminate.  Poor delineation of the pancreas and abdominal aorta secondary to bowel gas. Proximal abdominal aorta measures up to 3 cm.  Question mild fatty infiltration the liver.   Electronically Signed  By: Genia Del M.D.  On: 04/15/2015 07:21 LIMITED ULTRASOUND OF PELVIS  TECHNIQUE: Limited transabdominal ultrasound examination of the pelvis was performed.  COMPARISON: None.  FINDINGS: The urinary bladder has a normal appearance for the degree of distention.  Pre-void volume: 814.8 ml  Post-void volume: Patient unable to void.  Other findings: Mild echogenic debris present within the bladder with mild patchy wall irregularity over the left lateral aspect and dependent portion.  IMPRESSION: Bladder volume 814.8 mL as patient unable to void. Mild internal echogenic debris within the bladder as well as patchy wall irregularity along the left lateral aspect and dependent portion which may be due to  infection/inflammatory process or  hemorrhagic debris and less likely neoplastic process.   Electronically Signed  By: Marin Olp M.D.  On: 04/15/2015 08:22  Impression/Assessment:  1. E. Coli UTI:  2. A fib/flutter 3. Recent MI 4. HBP 5. DMII 6. L pneumonia 7. Rectal bleeding 8. Acute on chronic kidney injury 2ndary to obstructive uropathy.   Mr. Sula Soda is very reluctant to discuss anything relative to his urination history: ? Confusion. His wife, howefer, is definite that he has had 5 years of progressive symptoms. He would not go to see a doctor until he had " gotten so close to death" that he just "had to go to the doctor".  Plan:    Psa/alk phos/testosterone levels Possible tamsulosin/finasteride when cardiology will approve.  Eventual prostate u/s for size, uroflo, pvr.   Kaylany Tesoriero I Rether Rison 04/19/2015, 4:29 PM

## 2015-04-20 LAB — BASIC METABOLIC PANEL
Anion gap: 14 (ref 5–15)
BUN: 56 mg/dL — ABNORMAL HIGH (ref 6–20)
CHLORIDE: 108 mmol/L (ref 101–111)
CO2: 21 mmol/L — AB (ref 22–32)
Calcium: 8.5 mg/dL — ABNORMAL LOW (ref 8.9–10.3)
Creatinine, Ser: 3.18 mg/dL — ABNORMAL HIGH (ref 0.61–1.24)
GFR calc Af Amer: 21 mL/min — ABNORMAL LOW (ref >60)
GFR calc non Af Amer: 18 mL/min — ABNORMAL LOW (ref >60)
GLUCOSE: 108 mg/dL — AB (ref 65–99)
POTASSIUM: 3.3 mmol/L — AB (ref 3.5–5.1)
Sodium: 143 mmol/L (ref 135–145)

## 2015-04-20 LAB — CBC
HCT: 27.9 % — ABNORMAL LOW (ref 39.0–52.0)
HEMOGLOBIN: 9.3 g/dL — AB (ref 13.0–17.0)
MCH: 31.7 pg (ref 26.0–34.0)
MCHC: 33.3 g/dL (ref 30.0–36.0)
MCV: 95.2 fL (ref 78.0–100.0)
Platelets: 214 10*3/uL (ref 150–400)
RBC: 2.93 MIL/uL — ABNORMAL LOW (ref 4.22–5.81)
RDW: 13.2 % (ref 11.5–15.5)
WBC: 8.7 10*3/uL (ref 4.0–10.5)

## 2015-04-20 LAB — HEPARIN LEVEL (UNFRACTIONATED)
HEPARIN UNFRACTIONATED: 0.54 [IU]/mL (ref 0.30–0.70)
Heparin Unfractionated: >2.2 IU/mL — ABNORMAL HIGH (ref 0.30–0.70)
Heparin Unfractionated: 0.44 IU/mL (ref 0.30–0.70)

## 2015-04-20 MED ORDER — TAMSULOSIN HCL 0.4 MG PO CAPS
0.4000 mg | ORAL_CAPSULE | Freq: Every day | ORAL | Status: DC
  Administered 2015-04-20 – 2015-04-22 (×3): 0.4 mg via ORAL
  Filled 2015-04-20 (×4): qty 1

## 2015-04-20 MED ORDER — FINASTERIDE 5 MG PO TABS
5.0000 mg | ORAL_TABLET | Freq: Every day | ORAL | Status: DC
  Administered 2015-04-20 – 2015-04-23 (×4): 5 mg via ORAL
  Filled 2015-04-20 (×4): qty 1

## 2015-04-20 MED ORDER — POTASSIUM CHLORIDE CRYS ER 20 MEQ PO TBCR
40.0000 meq | EXTENDED_RELEASE_TABLET | Freq: Once | ORAL | Status: AC
  Administered 2015-04-20: 40 meq via ORAL
  Filled 2015-04-20: qty 2

## 2015-04-20 NOTE — Progress Notes (Signed)
Subjective:  Urologic meds:   Tamsulosin, finasteride  ( ok by cardiology). The patient reports feeling improved. Urine clearing.  BPH Recurrent A. fib /atrial flutter with RVR (ChadsVasc score of 3) Probable very small type II MI secondary to demand ischemia/abnormal EKG rule out coronary insufficiency Hypertension New-onset diabetes, II Hypercholesteremia Community acquired left pneumonia UTI Probable acute on chronic kidney injury secondary to obstructive uropathy Dehydration Anemia of chronic disease Status post rectal bleeding Objective: Vital signs in last 24 hours: Temp:  [97.6 F (36.4 C)-98.2 F (36.8 C)] 98.2 F (36.8 C) (07/31 1234) Pulse Rate:  [73-86] 86 (07/31 1234) Resp:  [18-22] 22 (07/31 0409) BP: (142-164)/(63-72) 144/71 mmHg (07/31 1234) SpO2:  [98 %-100 %] 98 % (07/31 1234) Weight:  [87.4 kg (192 lb 10.9 oz)] 87.4 kg (192 lb 10.9 oz) (07/31 0409)A  Intake/Output from previous day: 07/30 0701 - 07/31 0700 In: 2577.4 [P.O.:300; I.V.:2227.4; IV Piggyback:50] Out: 3702 [Urine:3701; Stool:1] Intake/Output this shift: Total I/O In: 556 [P.O.:556] Out: 1000 [Urine:1000]  Past Medical History  Diagnosis Date  . Hypertension     Physical Exam:  Lungs - Normal respiratory effort, chest expands symmetrically.  Abdomen - Soft, non-tender & non-distended.  Lab Results:  Recent Labs  04/19/15 0234 04/19/15 1428 04/20/15 0441  WBC 7.6 8.6 8.7  HGB 7.4* 9.3* 9.3*  HCT 21.8* 27.8* 27.9*   BMET  Recent Labs  04/19/15 0234 04/20/15 0441  NA 139 143  K 2.8* 3.3*  CL 108 108  CO2 22 21*  GLUCOSE 116* 108*  BUN 62* 56*  CREATININE 3.48* 3.18*  CALCIUM 7.2* 8.5*   No results for input(s): LABURIN in the last 72 hours. Results for orders placed or performed during the hospital encounter of 04/14/15  Blood culture (routine x 2)     Status: None   Collection Time: 04/14/15  5:15 PM  Result Value Ref Range Status   Specimen Description BLOOD LEFT  ANTECUBITAL  Final   Special Requests BOTTLES DRAWN AEROBIC AND ANAEROBIC 5CC EACH  Final   Culture  Setup Time   Final    GRAM NEGATIVE RODS IN BOTH AEROBIC AND ANAEROBIC BOTTLES CRITICAL RESULT CALLED TO, READ BACK BY AND VERIFIED WITH: A AGUIRRE,RN AT [id number] 04/15/15 BY L BENFIELD    Culture   Final    ESCHERICHIA COLI SUSCEPTIBILITIES PERFORMED ON PREVIOUS CULTURE WITHIN THE LAST 5 DAYS. Performed at [facility]    Report Status 04/17/2015 FINAL  Final  Blood culture (routine x 2)     Status: None   Collection Time: 04/14/15  5:30 PM  Result Value Ref Range Status   Specimen Description BLOOD RIGHT ARM  Final   Special Requests BOTTLES DRAWN AEROBIC ONLY 4CC  Final   Culture  Setup Time   Final    GRAM NEGATIVE RODS AEROBIC BOTTLE ONLY CRITICAL RESULT CALLED TO, READ BACK BY AND VERIFIED WITH: C WHITE,RN AT [id number] 04/15/15 BY L BENFIELD    Culture   Final    ESCHERICHIA COLI Performed at [facility]    Report Status 04/17/2015 FINAL  Final   Organism ID, Bacteria ESCHERICHIA COLI  Final      Susceptibility   Escherichia coli - MIC*    AMPICILLIN <=2 SENSITIVE Sensitive     CEFAZOLIN <=4 SENSITIVE Sensitive     CEFEPIME <=1 SENSITIVE Sensitive     CEFTAZIDIME <=1 SENSITIVE Sensitive     CEFTRIAXONE <=1 SENSITIVE Sensitive     CIPROFLOXACIN <=0.25 SENSITIVE  Sensitive     GENTAMICIN <=1 SENSITIVE Sensitive     IMIPENEM <=0.25 SENSITIVE Sensitive     TRIMETH/SULFA <=20 SENSITIVE Sensitive     AMPICILLIN/SULBACTAM <=2 SENSITIVE Sensitive     PIP/TAZO <=4 SENSITIVE Sensitive     * ESCHERICHIA COLI  MRSA PCR Screening     Status: None   Collection Time: 04/14/15  9:50 PM  Result Value Ref Range Status   MRSA by PCR NEGATIVE NEGATIVE Final    Comment:        The GeneXpert MRSA Assay (FDA approved for NASAL specimens only), is one component of a comprehensive MRSA colonization surveillance program. It is not intended to diagnose MRSA infection nor to guide  or monitor treatment for MRSA infections.   Culture, Urine     Status: None   Collection Time: 04/17/15  8:13 AM  Result Value Ref Range Status   Specimen Description URINE, CATHETERIZED  Final   Special Requests NONE  Final   Culture NO GROWTH 1 DAY  Final   Report Status 04/18/2015 FINAL  Final    Studies/Results: No results found.  Assessment: Cr: 3.18/GFR=18.  Urine clear. No flank pain. No ecchymoses.   Plan: Continue tamsulosin and finasteride.            Voiding trial with pvr. Will b ok to send home with foley and RTC  [id number])  For BPH work-up. What is nephrology plan for Stage V CKD?Marland Kitchen   Ciarrah Rae I Meila Berke 04/20/2015, 2:25 PM

## 2015-04-20 NOTE — Progress Notes (Signed)
Pt presented with blue fingertips upon assessment. Pt is asymptomatic, VSS. Was able to get a pulse ox reading on lower ear lobe and fingertip after warming fingers with a heat pack. 02 99%. Pts room is cool and he states that he prefers it to be cool in the room. Will continue to monitor patient closely.   Daman Steffenhagen, RN

## 2015-04-20 NOTE — Progress Notes (Signed)
ANTICOAGULATION CONSULT NOTE - Follow Up Consult  Pharmacy Consult for heparin Indication: atrial fibrillation  Labs:  Recent Labs  04/17/15 0234  04/18/15 0229 04/18/15 0500 04/19/15 0234 04/19/15 0510 04/19/15 1428 04/19/15 2322  HGB 8.7*  --   --  8.8* 7.4*  --  9.3*  --   HCT 25.1*  --   --  25.6* 21.8*  --  27.8*  --   PLT 139*  --   --  160 166  --  188  --   HEPARINUNFRC 0.26*  < > 0.59  --  >2.20* 0.81* 0.77* 0.54  CREATININE 5.43*  --  4.54*  --  3.48*  --   --   --   < > = values in this interval not displayed.    Assessment/Plan:  74yo male therapeutic on heparin after rate changes. Will continue gtt at current rate and confirm stable with am labs.   Wynona Neat, PharmD, BCPS  04/20/2015,12:24 AM

## 2015-04-20 NOTE — Progress Notes (Signed)
[facility] PROGRESS NOTE  [name] [id number] DOB: [birth year] DOA: 04/14/2015  PCP: No primary care provider on file. Has seen Dr. [name] in the past.  Brief HPI: 74 year old Caucasian male with a past medical history of hypertension who denies taking any medications at home, presented with complaints of weakness and fatigue. He was found to be in atrial fibrillation with RVR. He had a temperature of 102F, likely secondary to pneumonia. He also was noted to be in renal failure. Patient was hospitalized for further management. Patient was seen by cardiology. He was placed on a Cardizem infusion. He was also started on amiodarone. Renal function started improving. Patient then developed mild hematuria. Seen by urology.  Past medical history:  Past Medical History  Diagnosis Date  . Hypertension     Consultants: Cardiology, Dr. Terrence Dupont. Nephrology. Urology  Procedures:  2-D echocardiogram Study Conclusions - Left ventricle: The cavity size was normal. Wall thickness wasincreased in a pattern of mild LVH. Systolic function was normal.The estimated ejection fraction was in the range of 55% to 60%.Wall motion was normal; there were no regional wall motionabnormalities. Left ventricular diastolic function parameterswere normal. - Aortic valve: There was trivial regurgitation. - Atrial septum: No defect or patent foramen ovale was identified.  Antibiotics: Zosyn till 7/28 Ceftriaxone 7/28  Subjective: Patient feels well. No complaints offered. No bowel movements so far today. Patient denies any chest pain or shortness of breath. No nausea or vomiting.   Objective: Vital Signs  Filed Vitals:   04/19/15 0753 04/19/15 1017 04/19/15 2021 04/20/15 0409  BP: 147/87 156/88 164/72 142/63  Pulse: 74 80 82 73  Temp: 98.2 F (36.8 C) 97.9 F (36.6 C) 97.7 F (36.5 C) 97.6 F (36.4 C)  TempSrc: Oral Oral Oral Oral  Resp: 28 22 18 22   Height:      Weight:     87.4 kg (192 lb 10.9 oz)  SpO2: 100% 100% 100% 100%    Intake/Output Summary (Last 24 hours) at 04/20/15 0907 Last data filed at 04/20/15 0845  Gross per 24 hour  Intake 2865.37 ml  Output   4702 ml  Net -1836.63 ml   Filed Weights   04/18/15 0500 04/19/15 0400 04/20/15 0409  Weight: 82.7 kg (182 lb 5.1 oz) 87.7 kg (193 lb 5.5 oz) 87.4 kg (192 lb 10.9 oz)    General appearance: alert, cooperative, appears stated age and no distress Resp: Clear to auscultation bilaterally.  Cardio: S1, S2 irregularly irregular. No S3, S4. No rubs, murmurs, or bruit. No pedal edema. GI: soft, non-tender; bowel sounds normal; no masses,  no organomegaly Extremities: extremities normal, atraumatic, no cyanosis or edema Neurologic: No focal deficits.  Lab Results:  Basic Metabolic Panel:  Recent Labs Lab 04/14/15 2218  04/16/15 0249 04/17/15 0234 04/18/15 0229 04/19/15 0234 04/20/15 0441  NA  --   < > 137 140 141 139 143  K  --   < > 4.0 3.6 3.2* 2.8* 3.3*  CL  --   < > 106 108 107 108 108  CO2  --   < > 16* 19* 21* 22 21*  GLUCOSE  --   < > 118* 131* 133* 116* 108*  BUN  --   < > 94* 92* 80* 62* 56*  CREATININE  --   < > 6.45* 5.43* 4.54* 3.48* 3.18*  CALCIUM  --   < > 8.3* 8.3* 8.3* 7.2* 8.5*  PHOS 4.2  --  6.7* 5.7* 5.0*  3.5  --   < > = values in this interval not displayed. Liver Function Tests:  Recent Labs Lab 04/14/15 1511 04/15/15 0355 04/16/15 0249 04/17/15 0234 04/18/15 0229 04/19/15 0234 04/19/15 1828  AST 22 17  --   --   --   --   --   ALT 11* 13*  --   --   --   --   --   ALKPHOS 59 53  --   --   --   --  59  BILITOT 0.7 0.5  --   --   --   --   --   PROT 8.0 6.2*  --   --   --   --   --   ALBUMIN 3.9 3.0* 2.8* 2.4* 2.5* 2.3*  --    CBC:  Recent Labs Lab 04/15/15 0355  04/17/15 0234 04/18/15 0500 04/19/15 0234 04/19/15 1428 04/20/15 0441  WBC 7.4  < > 5.2 7.2 7.6 8.6 8.7  NEUTROABS 6.8  --   --   --   --   --   --   HGB 9.0*  < > 8.7* 8.8* 7.4* 9.3*  9.3*  HCT 25.7*  < > 25.1* 25.6* 21.8* 27.8* 27.9*  MCV 92.8  < > 93.0 92.1 92.8 94.6 95.2  PLT 122*  < > 139* 160 166 188 214  < > = values in this interval not displayed. Cardiac Enzymes:  Recent Labs Lab 04/14/15 1511 04/14/15 2218 04/15/15 0355 04/15/15 0921  TROPONINI 0.16* 0.19* 0.18* 0.14*    Recent Results (from the past 240 hour(s))  Blood culture (routine x 2)     Status: None   Collection Time: 04/14/15  5:15 PM  Result Value Ref Range Status   Specimen Description BLOOD LEFT ANTECUBITAL  Final   Special Requests BOTTLES DRAWN AEROBIC AND ANAEROBIC 5CC EACH  Final   Culture  Setup Time   Final    GRAM NEGATIVE RODS IN BOTH AEROBIC AND ANAEROBIC BOTTLES CRITICAL RESULT CALLED TO, READ BACK BY AND VERIFIED WITH: A AGUIRRE,RN AT [id number] 04/15/15 BY L BENFIELD    Culture   Final    ESCHERICHIA COLI SUSCEPTIBILITIES PERFORMED ON PREVIOUS CULTURE WITHIN THE LAST 5 DAYS. Performed at [facility]    Report Status 04/17/2015 FINAL  Final  Blood culture (routine x 2)     Status: None   Collection Time: 04/14/15  5:30 PM  Result Value Ref Range Status   Specimen Description BLOOD RIGHT ARM  Final   Special Requests BOTTLES DRAWN AEROBIC ONLY 4CC  Final   Culture  Setup Time   Final    GRAM NEGATIVE RODS AEROBIC BOTTLE ONLY CRITICAL RESULT CALLED TO, READ BACK BY AND VERIFIED WITH: C WHITE,RN AT 1055 04/15/15 BY L BENFIELD    Culture   Final    ESCHERICHIA COLI Performed at [facility]    Report Status 04/17/2015 FINAL  Final   Organism ID, Bacteria ESCHERICHIA COLI  Final      Susceptibility   Escherichia coli - MIC*    AMPICILLIN <=2 SENSITIVE Sensitive     CEFAZOLIN <=4 SENSITIVE Sensitive     CEFEPIME <=1 SENSITIVE Sensitive     CEFTAZIDIME <=1 SENSITIVE Sensitive     CEFTRIAXONE <=1 SENSITIVE Sensitive     CIPROFLOXACIN <=0.25 SENSITIVE Sensitive     GENTAMICIN <=1 SENSITIVE Sensitive     IMIPENEM <=0.25 SENSITIVE Sensitive     TRIMETH/SULFA  <=20 SENSITIVE Sensitive  AMPICILLIN/SULBACTAM <=2 SENSITIVE Sensitive     PIP/TAZO <=4 SENSITIVE Sensitive     * ESCHERICHIA COLI  MRSA PCR Screening     Status: None   Collection Time: 04/14/15  9:50 PM  Result Value Ref Range Status   MRSA by PCR NEGATIVE NEGATIVE Final    Comment:        The GeneXpert MRSA Assay (FDA approved for NASAL specimens only), is one component of a comprehensive MRSA colonization surveillance program. It is not intended to diagnose MRSA infection nor to guide or monitor treatment for MRSA infections.   Culture, Urine     Status: None   Collection Time: 04/17/15  8:13 AM  Result Value Ref Range Status   Specimen Description URINE, CATHETERIZED  Final   Special Requests NONE  Final   Culture NO GROWTH 1 DAY  Final   Report Status 04/18/2015 FINAL  Final      Studies/Results: No results found.  Medications:  Scheduled: . amiodarone  200 mg Oral BID  . antiseptic oral rinse  7 mL Mouth Rinse BID  . aspirin  81 mg Oral Daily  . cefTRIAXone (ROCEPHIN)  IV  2 g Intravenous Q24H  . diltiazem  60 mg Oral 4 times per day  . feeding supplement (ENSURE ENLIVE)  237 mL Oral BID BM  . potassium chloride  40 mEq Oral Once  . sodium chloride  3 mL Intravenous Q12H   Continuous: . heparin 1,700 Units/hr (04/20/15 0340)  . sodium chloride 0.9 % 1,000 mL infusion 75 mL/hr at 04/20/15 0041   [id number] **OR** acetaminophen, ondansetron **OR** ondansetron (ZOFRAN) IV  Assessment/Plan:  Principal Problem:   Sepsis Active Problems:   Atrial fibrillation with RVR   CAP (community acquired pneumonia)   Essential hypertension   Elevated troponin   Normocytic hypochromic anemia   CKD (chronic kidney disease) stage 4, GFR 15-29 ml/min   Gram-negative bacteremia   AKI (acute kidney injury)   Acute renal failure   Hematuria    Sepsis with Escherichia coli bacteremia Source is likely UTI. Unfortunately, urine culture was not sent at the  time of admission. Chest x-ray also suggested pneumonia. Patient was initially started on ceftriaxone and azithromycin. Was changed over to [facility] 7/26. Blood cultures growing Escherichia coli. Sensitivities have been noted. Patient changed over to ceftriaxone. Noted to have high pro-calcitonin levels. Lactic acid level was normal. Hemodynamically stable. We will transition to oral antibiotics in the next 1-2 days. Will need antibiotics for at least 2 weeks total.  Acute renal failure with bilateral hydronephrosis Creatinine continues to improve. Has good urine output. Nephrology has signed off. Renal ultrasound showed bilateral hydronephrosis. Case had been discussed with urology as well.  Continue IV fluids. Patient was taking NSAIDs at home.   Hematuria Plan was to continue Foley catheter at the time of discharge and outpatient follow-up. And to place the patient on Flomax at the time of discharge. However, he has developed hematuria. UA shows hematuria. Urology is following.  Anemia of chronic disease Hemoglobin was low yesterday morning but then stabilized and actually back to his usual baseline. Patient mentioned that he may have had a black stool 7/30 morning. But according to the nurse, it was normal. We will continue to monitor for now.  Atrial fibrillation/atrial flutter with RVR Currently in sinus rhythm. Cardiology is following. Patient is on amiodarone as well as oral Cardizem now. We will need to initiate warfarin once hematuria issue is addressed.   Questionable left  lower lobe pneumonia Will need follow-up chest x-ray in 4-6 weeks. Continue antibiotics.  Elevated troponin, likely secondary to demand ischemia Multifactorial due to sepsis, acute renal failure, atrial fibrillation. No wall motion abnormality noted on echocardiogram. Cardiology is following. Continue aspirin.   DVT Prophylaxis: On IV heparin    Code Status: DO NOT RESUSCITATE  Family Communication: Discussed with the  patient Disposition Plan: PT and OT to evaluate. Await further urological input.  Follow-up Appointment?: We'll need appointment with Dr. [name], along with urology and cardiology.   LOS: 6 days   [facility] Pager [phone] 04/20/2015, 9:07 AM  If 7PM-7AM, please contact night-coverage at [email or web address], password [facility]

## 2015-04-20 NOTE — Progress Notes (Signed)
ANTICOAGULATION CONSULT NOTE - Follow Up Consult  Pharmacy Consult for Heparin Indication: atrial fibrillation  No Known Allergies  Patient Measurements: Height: 5\' 4"  (162.6 cm) Weight: 192 lb 10.9 oz (87.4 kg) IBW/kg (Calculated) : 59.2 Heparin Dosing Weight: 78 kg  Vital Signs: Temp: 98.2 F (36.8 C) (07/31 1234) Temp Source: Axillary (07/31 1234) BP: 144/71 mmHg (07/31 1234) Pulse Rate: 86 (07/31 1234)  Labs:  Recent Labs  04/18/15 0229  04/19/15 0234  04/19/15 1428 04/19/15 2322 04/20/15 0441  HGB  --   < > 7.4*  --  9.3*  --  9.3*  HCT  --   < > 21.8*  --  27.8*  --  27.9*  PLT  --   < > 166  --  188  --  214  HEPARINUNFRC 0.59  --  >2.20*  < > 0.77* 0.54 0.44  CREATININE 4.54*  --  3.48*  --   --   --  3.18*  < > = values in this interval not displayed.  Estimated Creatinine Clearance: 21.2 mL/min (by C-G formula based on Cr of 3.18).  Assessment: Anticoagulation: Heparin for new onset afib. Heparin started 7/25.  Heparin drip 1700 uts/hr HL 0.44 at goal.  H/h low stable Plts stable.   Goal of Therapy:  Heparin level 0.3-0.7 units/ml Monitor platelets by anticoagulation protocol: Yes   Plan:  Continue Heparin at 1700 units/hr  Heparin level and CBC in AM. Considering Coumadin   Bonnita Nasuti Pharm.D. CPP, BCPS Clinical Pharmacist [phone] 04/20/2015 1:28 PM

## 2015-04-20 NOTE — Evaluation (Signed)
Physical Therapy Evaluation Patient Details Name: [name] MRN: [id number] DOB: Jan 18, 1941 Today's Date: 04/20/2015   History of Present Illness  74 year old Caucasian male with a past medical history of hypertension who denies taking any medications at home, presented with complaints of weakness and fatigue. He was found to be in atrial fibrillation with RVR. He had a temperature of 102F, likely secondary to pneumonia  Clinical Impression  Pt with decreased strength, mobility and activity tolerance who is normally independent, grilling and working in his shop as well as climbing flight of stairs to the bedroom. Pt with decreased activity tolerance who will benefit from acute therapy to maximize independence and function to return to PLOF. Recommend daily ambulation with nursing and RW.    Follow Up Recommendations Home health PT    Equipment Recommendations  Rolling walker with 5" wheels    Recommendations for Other Services       Precautions / Restrictions Precautions Precautions: Fall      Mobility  Bed Mobility               General bed mobility comments: EOB on arrival  Transfers Overall transfer level: Needs assistance   Transfers: Sit to/from Stand Sit to Stand: Supervision         General transfer comment: cues for hand placement with assist for lines from bed and toilet  Ambulation/Gait Ambulation/Gait assistance: Min guard Ambulation Distance (Feet): 200 Feet Assistive device: Rolling walker (2 wheeled);None Gait Pattern/deviations: Step-through pattern;Decreased stride length;Trunk flexed   Gait velocity interpretation: Below normal speed for age/gender General Gait Details: pt reaching out for environmental supports with gait bed to bathroom 15', Pt walked 200' with RW with improved posture, balance and speed with cues for safety and position in Rw  Stairs            Wheelchair Mobility    Modified Rankin (Stroke Patients  Only)       Balance Overall balance assessment: Needs assistance   Sitting balance-Leahy Scale: Good       Standing balance-Leahy Scale: Fair                               Pertinent Vitals/Pain Pain Assessment: No/denies pain    Home Living Family/patient expects to be discharged to:: Private residence Living Arrangements: Spouse/significant other Available Help at Discharge: Family;Available 24 hours/day Type of Home: House Home Access: Stairs to enter   [employer] of Steps: 2 Home Layout: Two level;Bed/bath upstairs Home Equipment: Hand held shower head      Prior Function Level of Independence: Independent         Comments: normally independent with 3 periods of sliding to the floor at home immediately PTA which is not baseline for pt per wife     Hand Dominance   Dominant Hand: Left    Extremity/Trunk Assessment   Upper Extremity Assessment: Overall WFL for tasks assessed           Lower Extremity Assessment: LLE deficits/detail;RLE deficits/detail RLE Deficits / Details: bil knee flexion 4/5, knee extension 5/5, dorsiflexion 5/5, hip flexion 3/5 LLE Deficits / Details: bil knee flexion 4/5, knee extension 5/5, dorsiflexion 5/5, hip flexion 3/5  Cervical / Trunk Assessment: Other exceptions  Communication   Communication: HOH  Cognition Arousal/Alertness: Awake/alert Behavior During Therapy: WFL for tasks assessed/performed Overall Cognitive Status: Within Functional Limits for tasks assessed  General Comments      Exercises General Exercises - Lower Extremity Hip Flexion/Marching: AROM;Seated;Both;15 reps      Assessment/Plan    PT Assessment Patient needs continued PT services  PT Diagnosis Difficulty walking   PT Problem List Decreased strength;Decreased activity tolerance;Decreased balance;Decreased knowledge of use of DME;Decreased mobility  PT Treatment Interventions Gait  training;DME instruction;Stair training;Functional mobility training;Therapeutic activities;Therapeutic exercise;Patient/family education;Balance training   PT Goals (Current goals can be found in the Care Plan section) Acute Rehab PT Goals Patient Stated Goal: return home PT Goal Formulation: With patient/family Time For Goal Achievement: 06/04/15 Potential to Achieve Goals: Good    Frequency Min 3X/week   Barriers to discharge        Co-evaluation               End of Session   Activity Tolerance: Patient tolerated treatment well Patient left: in chair;with call bell/phone within reach;with chair alarm set;with family/visitor present Nurse Communication: Mobility status         Time: [id number] PT Time Calculation (min) (ACUTE ONLY): 26 min   Charges:   PT Evaluation $Initial PT Evaluation Tier I: 1 Procedure PT Treatments $Gait Training: 8-22 mins   PT G CodesMelford Aase 04/20/2015, 2:12 PM Elwyn Reach, [place]

## 2015-04-20 NOTE — Progress Notes (Signed)
Ref: No primary care provider on file.   Subjective:  No new complaints. Undergoing urology evaluations.  Objective:  Vital Signs in the last 24 hours: Temp:  [97.6 F (36.4 C)-97.7 F (36.5 C)] 97.6 F (36.4 C) (07/31 0409) Pulse Rate:  [73-82] 73 (07/31 0409) Cardiac Rhythm:  [-] Normal sinus rhythm (07/31 0850) Resp:  [18-22] 22 (07/31 0409) BP: (142-164)/(63-72) 142/63 mmHg (07/31 0409) SpO2:  [100 %] 100 % (07/31 0409) Weight:  [87.4 kg (192 lb 10.9 oz)] 87.4 kg (192 lb 10.9 oz) (07/31 0409)  Physical Exam: BP Readings from Last 1 Encounters:  04/20/15 142/63    Wt Readings from Last 1 Encounters:  04/20/15 87.4 kg (192 lb 10.9 oz)    Weight change: -0.3 kg (-10.6 oz)  HEENT: [name]/AT, Eyes- PERL, EOMI, Conjunctiva-Pink, Sclera-Non-icteric Neck: No JVD, No bruit, Trachea midline. Lungs:  Clearing, Bilateral. Cardiac:  Regular rhythm, normal S1 and S2, no S3.  Abdomen:  Soft, non-tender. Extremities:  No edema present. No cyanosis. No clubbing. CNS: AxOx3, Cranial nerves grossly intact, moves all 4 extremities. Right handed. Skin: Warm and dry.   Intake/Output from previous day: 07/30 0701 - 07/31 0700 In: 2577.4 [P.O.:300; I.V.:2227.4; IV Piggyback:50] Out: 3702 [Urine:3701; Stool:1]    Lab Results: BMET    Component Value Date/Time   NA 143 04/20/2015 0441   NA 139 04/19/2015 0234   NA 141 04/18/2015 0229   K 3.3* 04/20/2015 0441   K 2.8* 04/19/2015 0234   K 3.2* 04/18/2015 0229   CL 108 04/20/2015 0441   CL 108 04/19/2015 0234   CL 107 04/18/2015 0229   CO2 21* 04/20/2015 0441   CO2 22 04/19/2015 0234   CO2 21* 04/18/2015 0229   GLUCOSE 108* 04/20/2015 0441   GLUCOSE 116* 04/19/2015 0234   GLUCOSE 133* 04/18/2015 0229   BUN 56* 04/20/2015 0441   BUN 62* 04/19/2015 0234   BUN 80* 04/18/2015 0229   CREATININE 3.18* 04/20/2015 0441   CREATININE 3.48* 04/19/2015 0234   CREATININE 4.54* 04/18/2015 0229   CALCIUM 8.5* 04/20/2015 0441   CALCIUM 7.2*  04/19/2015 0234   CALCIUM 8.3* 04/18/2015 0229   GFRNONAA 18* 04/20/2015 0441   GFRNONAA 16* 04/19/2015 0234   GFRNONAA 12* 04/18/2015 0229   GFRAA 21* 04/20/2015 0441   GFRAA 19* 04/19/2015 0234   GFRAA 14* 04/18/2015 0229   CBC    Component Value Date/Time   WBC 8.7 04/20/2015 0441   RBC 2.93* 04/20/2015 0441   RBC 2.10* 04/14/2015 2218   HGB 9.3* 04/20/2015 0441   HCT 27.9* 04/20/2015 0441   PLT 214 04/20/2015 0441   MCV 95.2 04/20/2015 0441   MCH 31.7 04/20/2015 0441   MCHC 33.3 04/20/2015 0441   RDW 13.2 04/20/2015 0441   LYMPHSABS 0.3* 04/15/2015 0355   MONOABS 0.4 04/15/2015 0355   EOSABS 0.0 04/15/2015 0355   BASOSABS 0.0 04/15/2015 0355   HEPATIC Function Panel  Recent Labs  04/14/15 1511 04/15/15 0355  PROT 8.0 6.2*   HEMOGLOBIN A1C No components found for: HGA1C,  MPG CARDIAC ENZYMES Lab Results  Component Value Date   TROPONINI 0.14* 04/15/2015   TROPONINI 0.18* 04/15/2015   TROPONINI 0.19* 04/14/2015   BNP No results for input(s): PROBNP in the last 8760 hours. TSH  Recent Labs  04/14/15 2218 04/15/15 0355  TSH 1.125 0.877   CHOLESTEROL  Recent Labs  04/15/15 0355  CHOL 136    Scheduled Meds: . amiodarone  200 mg Oral BID  .  antiseptic oral rinse  7 mL Mouth Rinse BID  . aspirin  81 mg Oral Daily  . cefTRIAXone (ROCEPHIN)  IV  2 g Intravenous Q24H  . diltiazem  60 mg Oral 4 times per day  . feeding supplement (ENSURE ENLIVE)  237 mL Oral BID BM  . sodium chloride  3 mL Intravenous Q12H   Continuous Infusions: . heparin 1,700 Units/hr (04/20/15 0340)  . sodium chloride 0.9 % 1,000 mL infusion 75 mL/hr at 04/20/15 0041   PRN Meds:.acetaminophen **OR** acetaminophen, ondansetron **OR** ondansetron (ZOFRAN) IV  Assessment/Plan: Recurrent A. fib /atrial flutter with RVR (ChadsVasc score of 3) Probable very small type II MI secondary to demand ischemia/abnormal EKG rule out coronary insufficiency Hypertension New-onset diabetes,  II Hypercholesteremia Community acquired left pneumonia UTI Probable acute on chronic kidney injury secondary to obstructive uropathy Dehydration Anemia of chronic disease Status post rectal bleeding   OK to use Tamsulosin and finasteride.   LOS: 6 days    Dixie Dials  MD  04/20/2015, 10:49 AM

## 2015-04-21 LAB — CBC
HEMATOCRIT: 24.7 % — AB (ref 39.0–52.0)
Hemoglobin: 8.5 g/dL — ABNORMAL LOW (ref 13.0–17.0)
MCH: 32.6 pg (ref 26.0–34.0)
MCHC: 34.4 g/dL (ref 30.0–36.0)
MCV: 94.6 fL (ref 78.0–100.0)
PLATELETS: 223 10*3/uL (ref 150–400)
RBC: 2.61 MIL/uL — ABNORMAL LOW (ref 4.22–5.81)
RDW: 13.1 % (ref 11.5–15.5)
WBC: 8.7 10*3/uL (ref 4.0–10.5)

## 2015-04-21 LAB — HEPARIN LEVEL (UNFRACTIONATED): Heparin Unfractionated: 0.28 IU/mL — ABNORMAL LOW (ref 0.30–0.70)

## 2015-04-21 LAB — BASIC METABOLIC PANEL
Anion gap: 11 (ref 5–15)
BUN: 43 mg/dL — AB (ref 6–20)
CHLORIDE: 108 mmol/L (ref 101–111)
CO2: 22 mmol/L (ref 22–32)
Calcium: 8.5 mg/dL — ABNORMAL LOW (ref 8.9–10.3)
Creatinine, Ser: 2.64 mg/dL — ABNORMAL HIGH (ref 0.61–1.24)
GFR calc Af Amer: 26 mL/min — ABNORMAL LOW (ref >60)
GFR calc non Af Amer: 23 mL/min — ABNORMAL LOW (ref >60)
Glucose, Bld: 115 mg/dL — ABNORMAL HIGH (ref 65–99)
POTASSIUM: 3.4 mmol/L — AB (ref 3.5–5.1)
Sodium: 141 mmol/L (ref 135–145)

## 2015-04-21 LAB — OCCULT BLOOD X 1 CARD TO LAB, STOOL: Fecal Occult Bld: NEGATIVE

## 2015-04-21 LAB — PROTIME-INR
INR: 1.12 (ref 0.00–1.49)
Prothrombin Time: 14.6 seconds (ref 11.6–15.2)

## 2015-04-21 MED ORDER — HEPARIN SODIUM (PORCINE) 5000 UNIT/ML IJ SOLN
5000.0000 [IU] | Freq: Three times a day (TID) | INTRAMUSCULAR | Status: DC
  Administered 2015-04-21 – 2015-04-22 (×2): 5000 [IU] via SUBCUTANEOUS
  Filled 2015-04-21 (×5): qty 1

## 2015-04-21 MED ORDER — CEFPODOXIME PROXETIL 200 MG PO TABS
200.0000 mg | ORAL_TABLET | Freq: Two times a day (BID) | ORAL | Status: DC
  Administered 2015-04-21 – 2015-04-23 (×4): 200 mg via ORAL
  Filled 2015-04-21 (×6): qty 1

## 2015-04-21 MED ORDER — WARFARIN SODIUM 7.5 MG PO TABS
7.5000 mg | ORAL_TABLET | Freq: Once | ORAL | Status: AC
  Administered 2015-04-21: 7.5 mg via ORAL
  Filled 2015-04-21: qty 1

## 2015-04-21 MED ORDER — POTASSIUM CHLORIDE CRYS ER 20 MEQ PO TBCR
40.0000 meq | EXTENDED_RELEASE_TABLET | Freq: Once | ORAL | Status: AC
  Administered 2015-04-21: 40 meq via ORAL
  Filled 2015-04-21: qty 2

## 2015-04-21 MED ORDER — WARFARIN - PHARMACIST DOSING INPATIENT: Freq: Every day | Status: DC

## 2015-04-21 MED ORDER — SODIUM CHLORIDE 0.9 % IV SOLN: Freq: Once | INTRAVENOUS | Status: DC

## 2015-04-21 NOTE — Care Management Important Message (Signed)
Important Message  Patient Details  Name: [name] MRN: [id number] Date of Birth: 1941-06-18   Medicare Important Message Given:  Yes-fourth notification given    Nathen May 04/21/2015, 12:09 [place] Message  Patient Details  Name: [name] MRN: [id number] Date of Birth: April 30, 1941   Medicare Important Message Given:  Yes-fourth notification given    Nathen May 04/21/2015, 12:09 PM

## 2015-04-21 NOTE — Progress Notes (Signed)
Pt ambulated 550 ft x1 assist with walker. Pt tolerated well. VSS. No complaints. Pt back in bed with call bell within reach.  Lylee Corrow, RN

## 2015-04-21 NOTE — Progress Notes (Signed)
ANTIBIOTIC CONSULT NOTE - INITIAL  Pharmacy Consult for cefpodoxime Indication: UTI/Bacteremia  No Known Allergies  Patient Measurements: Height: 5\' 4"  (162.6 cm) Weight: 192 lb 10.9 oz (87.4 kg) IBW/kg (Calculated) : 59.2  Vital Signs: Temp: 97.8 F (36.6 C) (08/01 0509) Temp Source: Oral (08/01 0509) BP: 175/83 mmHg (08/01 0509) Pulse Rate: 79 (08/01 0509) Intake/Output from previous day: 07/31 0701 - 08/01 0700 In: [id number] [P.O.:1373] Out: 4500 [Urine:4500] Intake/Output from this shift: Total I/O In: 235 [P.O.:235] Out: 725 [Urine:725]  Labs:  Recent Labs  04/19/15 0234 04/19/15 1428 04/20/15 0441 04/21/15 0443  WBC 7.6 8.6 8.7 8.7  HGB 7.4* 9.3* 9.3* 8.5*  PLT 166 188 214 223  CREATININE 3.48*  --  3.18* 2.64*   Estimated Creatinine Clearance: 25.6 mL/min (by C-G formula based on Cr of 2.64). No results for input(s): VANCOTROUGH, VANCOPEAK, VANCORANDOM, GENTTROUGH, GENTPEAK, GENTRANDOM, TOBRATROUGH, TOBRAPEAK, TOBRARND, AMIKACINPEAK, AMIKACINTROU, AMIKACIN in the last 72 hours.   Microbiology: Recent Results (from the past 720 hour(s))  Blood culture (routine x 2)     Status: None   Collection Time: 04/14/15  5:15 PM  Result Value Ref Range Status   Specimen Description BLOOD LEFT ANTECUBITAL  Final   Special Requests BOTTLES DRAWN AEROBIC AND ANAEROBIC 5CC EACH  Final   Culture  Setup Time   Final    GRAM NEGATIVE RODS IN BOTH AEROBIC AND ANAEROBIC BOTTLES CRITICAL RESULT CALLED TO, READ BACK BY AND VERIFIED WITH: A AGUIRRE,RN AT [id number] 04/15/15 BY L BENFIELD    Culture   Final    ESCHERICHIA COLI SUSCEPTIBILITIES PERFORMED ON PREVIOUS CULTURE WITHIN THE LAST 5 DAYS. Performed at [facility]    Report Status 04/17/2015 FINAL  Final  Blood culture (routine x 2)     Status: None   Collection Time: 04/14/15  5:30 PM  Result Value Ref Range Status   Specimen Description BLOOD RIGHT ARM  Final   Special Requests BOTTLES DRAWN AEROBIC ONLY 4CC  Final    Culture  Setup Time   Final    GRAM NEGATIVE RODS AEROBIC BOTTLE ONLY CRITICAL RESULT CALLED TO, READ BACK BY AND VERIFIED WITH: C WHITE,RN AT 1055 04/15/15 BY L BENFIELD    Culture   Final    ESCHERICHIA COLI Performed at [facility]    Report Status 04/17/2015 FINAL  Final   Organism ID, Bacteria ESCHERICHIA COLI  Final      Susceptibility   Escherichia coli - MIC*    AMPICILLIN <=2 SENSITIVE Sensitive     CEFAZOLIN <=4 SENSITIVE Sensitive     CEFEPIME <=1 SENSITIVE Sensitive     CEFTAZIDIME <=1 SENSITIVE Sensitive     CEFTRIAXONE <=1 SENSITIVE Sensitive     CIPROFLOXACIN <=0.25 SENSITIVE Sensitive     GENTAMICIN <=1 SENSITIVE Sensitive     IMIPENEM <=0.25 SENSITIVE Sensitive     TRIMETH/SULFA <=20 SENSITIVE Sensitive     AMPICILLIN/SULBACTAM <=2 SENSITIVE Sensitive     PIP/TAZO <=4 SENSITIVE Sensitive     * ESCHERICHIA COLI  MRSA PCR Screening     Status: None   Collection Time: 04/14/15  9:50 PM  Result Value Ref Range Status   MRSA by PCR NEGATIVE NEGATIVE Final    Comment:        The GeneXpert MRSA Assay (FDA approved for NASAL specimens only), is one component of a comprehensive MRSA colonization surveillance program. It is not intended to diagnose MRSA infection nor to guide or monitor treatment for MRSA infections.  Culture, Urine     Status: None   Collection Time: 04/17/15  8:13 AM  Result Value Ref Range Status   Specimen Description URINE, CATHETERIZED  Final   Special Requests NONE  Final   Culture NO GROWTH 1 DAY  Final   Report Status 04/18/2015 FINAL  Final    Medical History: Past Medical History  Diagnosis Date  . Hypertension    Assessment: 58 yom admitted 04/14/2015 via [facility] ED with weakness that started a week ago but has gradually been worsening. He was found to be in new onset afib RVR (CHADSVASC 2). He will begin IV heparin. Noted to be in acute renal failure with sCr 5.33 and CrCl ~ 37ml/min.   PMH: HTN  Infectious Disease:  day # 5 CTX 2gm q24hr for E coli urosepsis/bacteremia ,possible CAP  Afb, WBC 8.7   Ceftriaxone 7/25 >>7/25, 7/28 >> 8/1 Azithromycin 7/25>>7/25  Zosyn 7/26 >> 7/28 Cefpodoxime 8/1   7/25 BCx (2/2): Ecoli - sens CTX, cipro  7/25 MRSA by PCR - Neg  7/26 S.pneumo antigen - Neg  Plan:  Discontinue ceftriaxone Initiate cefpodoxime 200 mg q12h Repeat Blood cultures Monitor clinical status  Levester Fresh, PharmD, BCPS Clinical Pharmacist Pager [phone] 04/21/2015 3:27 PM

## 2015-04-21 NOTE — Progress Notes (Addendum)
[facility] PROGRESS NOTE  [name] [id number] DOB: 04/14/1941 DOA: 04/14/2015  PCP: [name], MD Has seen Dr. [name] in the past.  Brief HPI: 74 year old Caucasian male with a past medical history of hypertension who denies taking any medications at home, presented with complaints of weakness and fatigue. He was found to be in atrial fibrillation with RVR. He had a temperature of 102F, likely secondary to pneumonia. He also was noted to be in renal failure. Patient was hospitalized for further management. Patient was seen by cardiology. He was placed on a Cardizem infusion. He was also started on amiodarone. Renal function started improving. Patient then developed mild hematuria. Seen by urology.  Past medical history:  Past Medical History  Diagnosis Date  . Hypertension     Consultants: Cardiology, Dr. Terrence Dupont. Nephrology. Urology  Procedures:  2-D echocardiogram Study Conclusions - Left ventricle: The cavity size was normal. Wall thickness wasincreased in a pattern of mild LVH. Systolic function was normal.The estimated ejection fraction was in the range of 55% to 60%.Wall motion was normal; there were no regional wall motionabnormalities. Left ventricular diastolic function parameterswere normal. - Aortic valve: There was trivial regurgitation. - Atrial septum: No defect or patent foramen ovale was identified.  Antibiotics: Zosyn till 7/28 Ceftriaxone 7/28--8/1 Vantin 8/1  Subjective: Patient denies any complaints. No chest pain. No shortness of breath. Somewhat tired after working with physical therapy this morning.    Objective: Vital Signs  Filed Vitals:   04/20/15 1234 04/20/15 2046 04/20/15 2100 04/21/15 0509  BP: 144/71 152/85  175/83  Pulse: 86 96  79  Temp: 98.2 F (36.8 C) 97.9 F (36.6 C)  97.8 F (36.6 C)  TempSrc: Axillary Oral  Oral  Resp: 20 20  20   Height:      Weight:      SpO2: 98%  99%     Intake/Output Summary  (Last 24 hours) at 04/21/15 1014 Last data filed at 04/21/15 0832  Gross per 24 hour  Intake   1292 ml  Output   3500 ml  Net  -2208 ml   Filed Weights   04/18/15 0500 04/19/15 0400 04/20/15 0409  Weight: 82.7 kg (182 lb 5.1 oz) 87.7 kg (193 lb 5.5 oz) 87.4 kg (192 lb 10.9 oz)    General appearance: alert, cooperative, appears stated age and no distress Resp: Clear to auscultation bilaterally.  Cardio: S1, S2 regular. No S3, S4. No rubs, murmurs, or bruit. No pedal edema. GI: soft, non-tender; bowel sounds normal; no masses,  no organomegaly Extremities: extremities normal, atraumatic, no cyanosis or edema Neurologic: No focal deficits.  Lab Results:  Basic Metabolic Panel:  Recent Labs Lab 04/14/15 2218  04/16/15 0249 04/17/15 0234 04/18/15 [id number] 04/19/15 0234 04/20/15 0441 04/21/15 0443  NA  --   < > 137 140 141 139 143 141  K  --   < > 4.0 3.6 3.2* 2.8* 3.3* 3.4*  CL  --   < > 106 108 107 108 108 108  CO2  --   < > 16* 19* 21* 22 21* 22  GLUCOSE  --   < > 118* 131* 133* 116* 108* 115*  BUN  --   < > 94* 92* 80* 62* 56* 43*  CREATININE  --   < > 6.45* 5.43* 4.54* 3.48* 3.18* 2.64*  CALCIUM  --   < > 8.3* 8.3* 8.3* 7.2* 8.5* 8.5*  PHOS 4.2  --  6.7* 5.7* 5.0* 3.5  --   --   < > =  values in this interval not displayed.  Liver Function Tests:  Recent Labs Lab 04/14/15 1511 04/15/15 0355 04/16/15 0249 04/17/15 0234 04/18/15 0229 04/19/15 0234 04/19/15 1828  AST 22 17  --   --   --   --   --   ALT 11* 13*  --   --   --   --   --   ALKPHOS 59 53  --   --   --   --  59  BILITOT 0.7 0.5  --   --   --   --   --   PROT 8.0 6.2*  --   --   --   --   --   ALBUMIN 3.9 3.0* 2.8* 2.4* 2.5* 2.3*  --    CBC:  Recent Labs Lab 04/15/15 0355  04/18/15 0500 04/19/15 0234 04/19/15 1428 04/20/15 0441 04/21/15 0443  WBC 7.4  < > 7.2 7.6 8.6 8.7 8.7  NEUTROABS 6.8  --   --   --   --   --   --   HGB 9.0*  < > 8.8* 7.4* 9.3* 9.3* 8.5*  HCT 25.7*  < > 25.6* 21.8* 27.8*  27.9* 24.7*  MCV 92.8  < > 92.1 92.8 94.6 95.2 94.6  PLT 122*  < > 160 166 188 214 223  < > = values in this interval not displayed.  Cardiac Enzymes:  Recent Labs Lab 04/14/15 1511 04/14/15 2218 04/15/15 0355 04/15/15 0921  TROPONINI 0.16* 0.19* 0.18* 0.14*    Recent Results (from the past 240 hour(s))  Blood culture (routine x 2)     Status: None   Collection Time: 04/14/15  5:15 PM  Result Value Ref Range Status   Specimen Description BLOOD LEFT ANTECUBITAL  Final   Special Requests BOTTLES DRAWN AEROBIC AND ANAEROBIC 5CC EACH  Final   Culture  Setup Time   Final    GRAM NEGATIVE RODS IN BOTH AEROBIC AND ANAEROBIC BOTTLES CRITICAL RESULT CALLED TO, READ BACK BY AND VERIFIED WITH: A AGUIRRE,RN AT [id number] 04/15/15 BY L BENFIELD    Culture   Final    ESCHERICHIA COLI SUSCEPTIBILITIES PERFORMED ON PREVIOUS CULTURE WITHIN THE LAST 5 DAYS. Performed at [facility]    Report Status 04/17/2015 FINAL  Final  Blood culture (routine x 2)     Status: None   Collection Time: 04/14/15  5:30 PM  Result Value Ref Range Status   Specimen Description BLOOD RIGHT ARM  Final   Special Requests BOTTLES DRAWN AEROBIC ONLY 4CC  Final   Culture  Setup Time   Final    GRAM NEGATIVE RODS AEROBIC BOTTLE ONLY CRITICAL RESULT CALLED TO, READ BACK BY AND VERIFIED WITH: C WHITE,RN AT 1055 04/15/15 BY L BENFIELD    Culture   Final    ESCHERICHIA COLI Performed at [facility]    Report Status 04/17/2015 FINAL  Final   Organism ID, Bacteria ESCHERICHIA COLI  Final      Susceptibility   Escherichia coli - MIC*    AMPICILLIN <=2 SENSITIVE Sensitive     CEFAZOLIN <=4 SENSITIVE Sensitive     CEFEPIME <=1 SENSITIVE Sensitive     CEFTAZIDIME <=1 SENSITIVE Sensitive     CEFTRIAXONE <=1 SENSITIVE Sensitive     CIPROFLOXACIN <=0.25 SENSITIVE Sensitive     GENTAMICIN <=1 SENSITIVE Sensitive     IMIPENEM <=0.25 SENSITIVE Sensitive     TRIMETH/SULFA <=20 SENSITIVE Sensitive      AMPICILLIN/SULBACTAM <=2  SENSITIVE Sensitive     PIP/TAZO <=4 SENSITIVE Sensitive     * ESCHERICHIA COLI  MRSA PCR Screening     Status: None   Collection Time: 04/14/15  9:50 PM  Result Value Ref Range Status   MRSA by PCR NEGATIVE NEGATIVE Final    Comment:        The GeneXpert MRSA Assay (FDA approved for NASAL specimens only), is one component of a comprehensive MRSA colonization surveillance program. It is not intended to diagnose MRSA infection nor to guide or monitor treatment for MRSA infections.   Culture, Urine     Status: None   Collection Time: 04/17/15  8:13 AM  Result Value Ref Range Status   Specimen Description URINE, CATHETERIZED  Final   Special Requests NONE  Final   Culture NO GROWTH 1 DAY  Final   Report Status 04/18/2015 FINAL  Final      Studies/Results: No results found.  Medications:  Scheduled: . amiodarone  200 mg Oral BID  . antiseptic oral rinse  7 mL Mouth Rinse BID  . aspirin  81 mg Oral Daily  . cefTRIAXone (ROCEPHIN)  IV  2 g Intravenous Q24H  . diltiazem  60 mg Oral 4 times per day  . feeding supplement (ENSURE ENLIVE)  237 mL Oral BID BM  . finasteride  5 mg Oral Daily  . potassium chloride  40 mEq Oral Once  . sodium chloride  3 mL Intravenous Q12H  . tamsulosin  0.4 mg Oral Daily   Continuous: . heparin 1,800 Units/hr (04/21/15 1013)  . sodium chloride 0.9 % 1,000 mL infusion 75 mL/hr at 04/21/15 0443   [id number] **OR** acetaminophen, ondansetron **OR** ondansetron (ZOFRAN) IV  Assessment/Plan:  Principal Problem:   Sepsis Active Problems:   Atrial fibrillation with RVR   CAP (community acquired pneumonia)   Essential hypertension   Elevated troponin   Normocytic hypochromic anemia   CKD (chronic kidney disease) stage 4, GFR 15-29 ml/min   Gram-negative bacteremia   AKI (acute kidney injury)   Acute renal failure   Hematuria    Sepsis with Escherichia coli bacteremia Source is likely UTI. Unfortunately,  urine culture was not sent at the time of admission. Chest x-ray also suggested pneumonia. Patient was initially started on ceftriaxone and azithromycin. Was changed over to [facility] 7/26. Blood cultures growing Escherichia coli. Sensitivities have been noted. Patient changed over to ceftriaxone. Was initially noted to have high pro-calcitonin levels. Lactic acid level was normal. Change to oral Vantin today. Will need antibiotics for at least 2 weeks total.  Acute renal failure with bilateral hydronephrosis Creatinine continues to improve. Has good urine output. Nephrology has signed off. Renal ultrasound showed bilateral hydronephrosis. Case had been discussed with urology as well.  Continue IV fluids. Patient was taking NSAIDs at home.   Hematuria Plan is to continue Foley catheter at the time of discharge and outpatient follow-up. She was started on tamsulosin and finasteride. Hematuria is clearing up. Hemoglobin did drop some what appears to be mostly stable. Will discuss with urology regarding initiation of anticoagulation.   Anemia of chronic disease Hemoglobin more or less stable. Patient does mention fatigue today. He mentioned dark stool. Rectal exam was done which revealed greenish stool. It was sent for Hemoccults, which was negative. Patient may symptomatically benefit from transfusion of one unit of blood. This will be ordered.   Atrial fibrillation/atrial flutter with RVR Currently in sinus rhythm. Cardiology is following. Patient is on amiodarone as well  as oral Cardizem now. We'll discuss with urology if it's okay to initiate anticoagulation or if it needs to be held for now. Change to long-acting Cardizem.  Questionable left lower lobe pneumonia Will need follow-up chest x-ray in 4-6 weeks. Continue antibiotics. Change to oral agents.  Elevated troponin, likely secondary to demand ischemia Multifactorial due to sepsis, acute renal failure, atrial fibrillation. No wall motion  abnormality noted on echocardiogram. Cardiology is following. Continue aspirin.   ADDENDUM Patient declines blood transfusion. Will repeat CBC tomorrow morning. Discussed with Dr. Terrence Dupont and with Dr. Gaynelle Arabian. Since bleeding is subsiding we will initiate warfarin.  DVT Prophylaxis: Changed to subcutaneous heparin today Code Status: DO NOT RESUSCITATE  Family Communication: Discussed with the patient and his wife Disposition Plan: PT and OT recommends home health. Anticipate discharge in 1-2 days.  Follow-up Appointment?: We'll need appointment with Dr. [name], along with urology and cardiology.   LOS: 7 days   [facility] Pager [phone] 04/21/2015, 10:14 AM  If 7PM-7AM, please contact night-coverage at [email or web address], password [facility]

## 2015-04-21 NOTE — Progress Notes (Signed)
ANTICOAGULATION CONSULT NOTE - Initial Consult  Pharmacy Consult for Warfarin Indication: atrial fibrillation  No Known Allergies  Patient Measurements: Height: 5\' 4"  (162.6 cm) Weight: 192 lb 10.9 oz (87.4 kg) IBW/kg (Calculated) : 59.2 Heparin Dosing Weight: 78 kg  Vital Signs:    Labs:  Recent Labs  04/19/15 0234  04/19/15 1428 04/19/15 2322 04/20/15 0441 04/21/15 0443 04/21/15 1719  HGB 7.4*  --  9.3*  --  9.3* 8.5*  --   HCT 21.8*  --  27.8*  --  27.9* 24.7*  --   PLT 166  --  188  --  214 223  --   LABPROT  --   --   --   --   --   --  14.6  INR  --   --   --   --   --   --  1.12  HEPARINUNFRC >2.20*  < > 0.77* 0.54 0.44 0.28*  --   CREATININE 3.48*  --   --   --  3.18* 2.64*  --   < > = values in this interval not displayed.  Estimated Creatinine Clearance: 25.6 mL/min (by C-G formula based on Cr of 2.64).  Assessment: Anticoagulation: Heparin for new onset afib. Heparin started 7/25.  Patient had some pink urine (possibly hematuria) but is minor.  BL INR is 1.12  Goal of Therapy:  INR 2 - 3   Plan:  -D/C Heparin IV and initiate SQ heparin -Warfarin 7.5 mg x 1 -Daily INR -Monitor for s/sx of bleeding  Levester Fresh, PharmD, BCPS Clinical Pharmacist Pager [phone] 04/21/2015 6:18 PM

## 2015-04-21 NOTE — Progress Notes (Signed)
ANTICOAGULATION CONSULT NOTE - Follow Up Consult  Pharmacy Consult for Heparin Indication: atrial fibrillation  No Known Allergies  Patient Measurements: Height: 5\' 4"  (162.6 cm) Weight: 192 lb 10.9 oz (87.4 kg) IBW/kg (Calculated) : 59.2 Heparin Dosing Weight: 78 kg  Vital Signs: Temp: 97.8 F (36.6 C) (08/01 0509) Temp Source: Oral (08/01 0509) BP: 175/83 mmHg (08/01 0509) Pulse Rate: 79 (08/01 0509)  Labs:  Recent Labs  04/19/15 0234  04/19/15 1428 04/19/15 2322 04/20/15 0441 04/21/15 0443  HGB 7.4*  --  9.3*  --  9.3* 8.5*  HCT 21.8*  --  27.8*  --  27.9* 24.7*  PLT 166  --  188  --  214 223  HEPARINUNFRC >2.20*  < > 0.77* 0.54 0.44 0.28*  CREATININE 3.48*  --   --   --  3.18*  --   < > = values in this interval not displayed.  Estimated Creatinine Clearance: 21.2 mL/min (by C-G formula based on Cr of 3.18).  Assessment: Anticoagulation: Heparin for new onset afib. Heparin started 7/25.  Heparin drip 1700 units/hr HL 0.28 (subtherapeutic).  Hgb low but stable Plts stable.   Goal of Therapy:  Heparin level 0.3-0.7 units/ml Monitor platelets by anticoagulation protocol: Yes   Plan:  Increase heparin to 1800 units/hr  F/u 8 hr heparin level  Sherlon Handing, PharmD, BCPS Clinical pharmacist, pager [phone] 04/21/2015 5:53 AM

## 2015-04-21 NOTE — Progress Notes (Signed)
Subjective:  Denies any chest pain or shortness of breath.  Denies palpitation, lightheadedness or syncope.  Coumadin was not started due to hematuria.  Patient has further drop in hemoglobin.  Patient recently had rectal bleeding and no hematuria.  Discussed with patient and his wife regarding chronic anticoagulation and its risks and benefits and agrees for holding off Coumadin until GI and GU issues are resolved  Objective:  Vital Signs in the last 24 hours: Temp:  [97.8 F (36.6 C)-97.9 F (36.6 C)] 97.8 F (36.6 C) (08/01 0509) Pulse Rate:  [79-96] 79 (08/01 0509) Resp:  [20] 20 (08/01 0509) BP: (152-175)/(83-85) 175/83 mmHg (08/01 0509) SpO2:  [99 %] 99 % (07/31 2100)  Intake/Output from previous day: 07/31 0701 - 08/01 0700 In: 1373 [P.O.:1373] Out: 4500 [Urine:4500] Intake/Output from this shift: Total I/O In: 235 [P.O.:235] Out: 725 [Urine:725]  Physical Exam: Neck: no adenopathy, no carotid bruit, no JVD and supple, symmetrical, trachea midline Lungs: clear to auscultation bilaterally Heart: regular rate and rhythm, S1, S2 normal and soft systolic murmur noted Abdomen: soft, non-tender; bowel sounds normal; no masses,  no organomegaly Extremities: extremities normal, atraumatic, no cyanosis or edema  Lab Results:  Recent Labs  04/20/15 0441 04/21/15 0443  WBC 8.7 8.7  HGB 9.3* 8.5*  PLT 214 223    Recent Labs  04/20/15 0441 04/21/15 0443  NA 143 141  K 3.3* 3.4*  CL 108 108  CO2 21* 22  GLUCOSE 108* 115*  BUN 56* 43*  CREATININE 3.18* 2.64*   No results for input(s): TROPONINI in the last 72 hours.  Invalid input(s): CK, MB Hepatic Function Panel  Recent Labs  04/19/15 0234 04/19/15 1828  ALBUMIN 2.3*  --   ALKPHOS  --  59   No results for input(s): CHOL in the last 72 hours. No results for input(s): PROTIME in the last 72 hours.  Imaging: Imaging results have been reviewed and No results found.  Cardiac Studies:  Assessment/Plan:   Recurrent A. fib /atrial flutter with RVR (ChadsVasc score of 3) Probable very small type II MI secondary to demand ischemia/abnormal EKG rule out coronary insufficiency Hypertension New-onset diabetes, II Hypercholesteremia Status postCommunity acquired left pneumonia UTI Probable acute on chronic kidney injury secondary to obstructive uropathy Dehydration Anemia of chronic disease Status post rectal bleeding Resolving hematuria Plan DC IV heparin and changed to subcutaneous for DVT prophylaxis. Check labs in a.m   LOS: 7 days    Charolette Forward 04/21/2015, 12:53 PM

## 2015-04-21 NOTE — Progress Notes (Signed)
Physical Therapy Treatment Patient Details Name: [name] MRN: [id number] DOB: Jul 18, 1941 Today's Date: 04/21/2015    History of Present Illness 74 year old Caucasian male with a past medical history of hypertension who denies taking any medications at home, presented with complaints of weakness and fatigue. He was found to be in atrial fibrillation with RVR. He had a temperature of 102F, likely secondary to pneumonia    PT Comments    Pt making steady progress with therapy.  Able to tolerate 4 steps today with single handrail.  Note that pt has 27 steps at home, however there are never more than 7 before a landing.  Provided education on energy conservation and safety with stairs as well as to have chair at landing so that pt can sit if needed.  SaO2 unable to be obtained during session due to cold hands, however HR WFL up to 90's during therapy.    Follow Up Recommendations  Home health PT     Equipment Recommendations  Rolling walker with 5" wheels    Recommendations for Other Services       Precautions / Restrictions Precautions Precautions: Fall Precaution Comments: watch HR Restrictions Weight Bearing Restrictions: No    Mobility  Bed Mobility Overal bed mobility: Modified Independent Bed Mobility: Sit to Supine     Supine to sit: Supervision Sit to supine: Modified independent (Device/Increase time)      Transfers Overall transfer level: Needs assistance Equipment used: Rolling walker (2 wheeled) Transfers: Sit to/from Stand Sit to Stand: Min guard         General transfer comment: cue for hand placement.   Ambulation/Gait Ambulation/Gait assistance: Supervision;Min guard Ambulation Distance (Feet): 180 Feet Assistive device: Rolling walker (2 wheeled);None Gait Pattern/deviations: Step-through pattern;Decreased stride length;Trunk flexed     General Gait Details: Min cues for safe positioning inside of RW, esp when turning, otherwise was  steady and no overt LOB during gait.  Did note increased WOB and some wheezing, however unable to get SaO2 reading due to extremely cold fingertips.    Stairs Stairs: Yes Stairs assistance: Min guard Stair Management: One rail Right;Step to pattern;Forwards Number of Stairs: 4 General stair comments: Min cues for safety with stairs as well as performing in step to fashion for increased safety.  Discussed with wife that he has 27 stairs to do in home but has 7 at a time with landing.  Recommend placing chair at each landing for seated rest breaks.   Wheelchair Mobility    Modified Rankin (Stroke Patients Only)       Balance Overall balance assessment: Needs assistance Sitting-balance support: Feet supported Sitting balance-Leahy Scale: Good     Standing balance support: During functional activity;Bilateral upper extremity supported Standing balance-Leahy Scale: Fair Standing balance comment: Pt able to stand with RW at S level                    Cognition Arousal/Alertness: Awake/alert Behavior During Therapy: WFL for tasks assessed/performed Overall Cognitive Status: Within Functional Limits for tasks assessed                      Exercises      General Comments        Pertinent Vitals/Pain Pain Assessment: No/denies pain    Home Living Family/patient expects to be discharged to:: Private residence Living Arrangements: Spouse/significant other Available Help at Discharge: Family Type of Home: House Home Access: Stairs to enter   Home Layout:  Two level;Bed/bath upstairs Home Equipment: Hand held shower head;Shower seat - built in      Prior Function Level of Independence: Independent      Comments: normally independent with 3 periods of sliding to the floor at home immediately PTA which is not baseline for pt per wife   PT Goals (current goals can now be found in the care plan section) Acute Rehab PT Goals Patient Stated Goal: not stated PT  Goal Formulation: With patient/family Time For Goal Achievement: 05/04/15 Potential to Achieve Goals: Good Progress towards PT goals: Progressing toward goals    Frequency  Min 3X/week    PT Plan Current plan remains appropriate    Co-evaluation             End of Session   Activity Tolerance: Patient limited by fatigue Patient left: in bed;with call bell/phone within reach;with family/visitor present     Time: 1022-1040 PT Time Calculation (min) (ACUTE ONLY): 18 min  Charges:  $Gait Training: 8-22 mins                    G Codes:      Denice Bors 04/21/2015, 12:31 PM

## 2015-04-21 NOTE — Evaluation (Signed)
Occupational Therapy Evaluation Patient Details Name: [name] MRN: [id number] DOB: December 24, 1940 Today's Date: 04/21/2015    History of Present Illness 74 year old Caucasian male with a past medical history of hypertension who denies taking any medications at home, presented with complaints of weakness and fatigue. He was found to be in atrial fibrillation with RVR. He had a temperature of 102F, likely secondary to pneumonia   Clinical Impression   Pt admitted with above. Pt independent with ADLs, PTA. Feel pt will benefit from acute OT to increase independence and activity tolerance prior to d/c.     Follow Up Recommendations  Home health OT;Supervision/Assistance - 24 hour    Equipment Recommendations  None recommended by OT    Recommendations for Other Services       Precautions / Restrictions Precautions Precautions: Fall Restrictions Weight Bearing Restrictions: No      Mobility Bed Mobility Overal bed mobility: Needs Assistance Bed Mobility: Supine to Sit     Supine to sit: Supervision        Transfers Overall transfer level: Needs assistance Equipment used: Rolling walker (2 wheeled) Transfers: Sit to/from Stand Sit to Stand: Min guard         General transfer comment: cue for hand placement.     Balance  Min guard for ambulation with RW. No LOB sitting EOB and managing socks.                                          ADL Overall ADL's : Needs assistance/impaired     Grooming: Oral care;Wash/dry face;Min guard;Set up;Supervision/safety;Standing               Lower Body Dressing: Min guard;Sit to/from stand   Toilet Transfer: Minimal assistance;Ambulation;Regular Museum/gallery exhibitions officer and Hygiene: Min guard;Sit to/from stand (performed clothing management standing)       Functional mobility during ADLs: Min guard;Rolling walker;Minimal assistance (did not use walker to get to  bathroom) General ADL Comments: Pt suddenly darted to bathroom as he had to have a BM. This was unsafe as he left his RW at sink along with IV. Pt took break in between managing socks.      Vision  Pt wears glasses   Perception     Praxis      Pertinent Vitals/Pain Pain Assessment: No/denies pain     Hand Dominance Left   Extremity/Trunk Assessment Upper Extremity Assessment Upper Extremity Assessment: Overall WFL for tasks assessed   Lower Extremity Assessment Lower Extremity Assessment: Defer to PT evaluation       Communication Communication Communication: HOH   Cognition Arousal/Alertness: Awake/alert Behavior During Therapy: Impulsive (impulsive trying to get to commode) Overall Cognitive Status: No family/caregiver present to determine baseline cognitive functioning                     General Comments       Exercises       Shoulder Instructions      Home Living Family/patient expects to be discharged to:: Private residence Living Arrangements: Spouse/significant other Available Help at Discharge: Family Type of Home: House Home Access: Stairs to enter Technical brewer of Steps: 2   Home Layout: Two level;Bed/bath upstairs Alternate Level Stairs-Number of Steps: 14 Alternate Level Stairs-Rails: Left Bathroom Shower/Tub: Occupational psychologist: Standard     Home Equipment: Engineer, manufacturing  held shower head;Shower seat - built in          Prior Functioning/Environment Level of Independence: Independent        Comments: normally independent with 3 periods of sliding to the floor at home immediately PTA which is not baseline for pt per wife    OT Diagnosis: Generalized weakness   OT Problem List: Decreased strength;Decreased activity tolerance;Decreased knowledge of precautions;Decreased knowledge of use of DME or AE;Decreased safety awareness   OT Treatment/Interventions: Self-care/ADL training;DME and/or AE  instruction;Therapeutic activities;Energy conservation;Therapeutic exercise;Cognitive remediation/compensation;Patient/family education;Balance training    OT Goals(Current goals can be found in the care plan section) Acute Rehab OT Goals Patient Stated Goal: not stated OT Goal Formulation: With patient Time For Goal Achievement: 04/28/15 Potential to Achieve Goals: Good ADL Goals Pt Will Perform Lower Body Bathing: with set-up;sit to/from stand Pt Will Perform Lower Body Dressing: with set-up;sit to/from stand Pt Will Transfer to Toilet: ambulating;with modified independence Pt Will Perform Toileting - Clothing Manipulation and hygiene: with modified independence;sit to/from stand Additional ADL Goal #1: Pt will independently verbalize 3/3 energy conservation techniques and utilize during session.  OT Frequency: Min 2X/week   Barriers to D/C:            Co-evaluation              End of Session Equipment Utilized During Treatment: Gait belt;Rolling walker Nurse Communication: Other (comment) (pt quickly went to toilet leaving IV and catheter at sink; floor needs mopped; pt on toilet with tech)  Activity Tolerance: Patient tolerated treatment well Patient left: Other (comment) (on toilet having BM with tech)   Time: [id number] OT Time Calculation (min): 20 min Charges:  OT General Charges $OT Visit: 1 Procedure OT Evaluation $Initial OT Evaluation Tier I: 1 Procedure G-CodesBenito Mccreedy OTR/L [id number] 04/21/2015, 10:08 AM

## 2015-04-22 LAB — BASIC METABOLIC PANEL
Anion gap: 12 (ref 5–15)
BUN: 41 mg/dL — AB (ref 6–20)
CO2: 24 mmol/L (ref 22–32)
Calcium: 8.6 mg/dL — ABNORMAL LOW (ref 8.9–10.3)
Chloride: 103 mmol/L (ref 101–111)
Creatinine, Ser: 2.5 mg/dL — ABNORMAL HIGH (ref 0.61–1.24)
GFR calc Af Amer: 28 mL/min — ABNORMAL LOW (ref >60)
GFR, EST NON AFRICAN AMERICAN: 24 mL/min — AB (ref >60)
Glucose, Bld: 120 mg/dL — ABNORMAL HIGH (ref 65–99)
POTASSIUM: 3.4 mmol/L — AB (ref 3.5–5.1)
Sodium: 139 mmol/L (ref 135–145)

## 2015-04-22 LAB — TESTOSTERONE: Testosterone: 26 ng/dL — ABNORMAL LOW (ref 348–1197)

## 2015-04-22 LAB — CBC
HEMATOCRIT: 21.8 % — AB (ref 39.0–52.0)
HEMOGLOBIN: 7.3 g/dL — AB (ref 13.0–17.0)
MCH: 31.5 pg (ref 26.0–34.0)
MCHC: 33.5 g/dL (ref 30.0–36.0)
MCV: 94 fL (ref 78.0–100.0)
Platelets: 260 10*3/uL (ref 150–400)
RBC: 2.32 MIL/uL — ABNORMAL LOW (ref 4.22–5.81)
RDW: 13.1 % (ref 11.5–15.5)
WBC: 9.5 10*3/uL (ref 4.0–10.5)

## 2015-04-22 LAB — PROTIME-INR
INR: 1.16 (ref 0.00–1.49)
Prothrombin Time: 15 seconds (ref 11.6–15.2)

## 2015-04-22 LAB — PREPARE RBC (CROSSMATCH)

## 2015-04-22 MED ORDER — SODIUM CHLORIDE 0.9 % IV SOLN
Freq: Once | INTRAVENOUS | Status: AC
  Administered 2015-04-22: 12:00:00 via INTRAVENOUS

## 2015-04-22 MED ORDER — DILTIAZEM HCL ER COATED BEADS 240 MG PO CP24
240.0000 mg | ORAL_CAPSULE | Freq: Every day | ORAL | Status: DC
  Administered 2015-04-22 – 2015-04-23 (×2): 240 mg via ORAL
  Filled 2015-04-22 (×2): qty 1

## 2015-04-22 MED ORDER — SODIUM CHLORIDE 0.9 % IV SOLN
INTRAVENOUS | Status: DC
  Administered 2015-04-23: 06:00:00 via INTRAVENOUS

## 2015-04-22 MED ORDER — FUROSEMIDE 10 MG/ML IJ SOLN
20.0000 mg | Freq: Once | INTRAMUSCULAR | Status: AC
  Administered 2015-04-22: 20 mg via INTRAVENOUS
  Filled 2015-04-22: qty 2

## 2015-04-22 MED ORDER — POTASSIUM CHLORIDE CRYS ER 20 MEQ PO TBCR
40.0000 meq | EXTENDED_RELEASE_TABLET | Freq: Once | ORAL | Status: AC
  Administered 2015-04-22: 40 meq via ORAL
  Filled 2015-04-22: qty 2

## 2015-04-22 MED ORDER — PANTOPRAZOLE SODIUM 40 MG PO TBEC
40.0000 mg | DELAYED_RELEASE_TABLET | Freq: Every day | ORAL | Status: DC
  Administered 2015-04-22 – 2015-04-23 (×2): 40 mg via ORAL
  Filled 2015-04-22 (×2): qty 1

## 2015-04-22 NOTE — Progress Notes (Signed)
Subjective:  Events of yesterday noted a shunt for further drop in hemoglobin being evaluated by GI. She denies any chest pain or shortness of breath. Remains in sinus rhythm. No further episodes of A. Fib with RVR.  Objective:  Vital Signs in the last 24 hours: Temp:  [97.9 F (36.6 C)-98.9 F (37.2 C)] 98.9 F (37.2 C) (08/02 1421) Pulse Rate:  [72-79] 72 (08/02 1421) Resp:  [18-21] 18 (08/02 1421) BP: (136-144)/(54-76) 144/63 mmHg (08/02 1421) SpO2:  [93 %-100 %] 97 % (08/02 1421) Weight:  [88 kg (194 lb 0.1 oz)] 88 kg (194 lb 0.1 oz) (08/02 0352)  Intake/Output from previous day: 08/01 0701 - 08/02 0700 In: 235 [P.O.:235] Out: 2525 [Urine:2525] Intake/Output from this shift: Total I/O In: 1499 [P.O.:1197; Blood:302] Out: 550 [Urine:550]  Physical Exam: Exam unchanged  Lab Results:  Recent Labs  04/21/15 0443 04/22/15 0314  WBC 8.7 9.5  HGB 8.5* 7.3*  PLT 223 260    Recent Labs  04/21/15 0443 04/22/15 0314  NA 141 139  K 3.4* 3.4*  CL 108 103  CO2 22 24  GLUCOSE 115* 120*  BUN 43* 41*  CREATININE 2.64* 2.50*   No results for input(s): TROPONINI in the last 72 hours.  Invalid input(s): CK, MB Hepatic Function Panel  Recent Labs  04/19/15 1828  ALKPHOS 59   No results for input(s): CHOL in the last 72 hours. No results for input(s): PROTIME in the last 72 hours.  Imaging: Imaging results have been reviewed and No results found.  Cardiac Studies:  Assessment/Plan:  Recurrent A. fib /atrial flutter with RVR (ChadsVasc score of 3) Probable very small type II MI secondary to demand ischemia/abnormal EKG rule out coronary insufficiency Hypertension New-onset diabetes, II Hypercholesteremia Status postCommunity acquired left pneumonia UTI Probable acute on chronic kidney injury secondary to obstructive uropathy Dehydration Anemia of chronic disease Status post rectal bleeding Resolving hematuria Plan Continue present management. GI  evaluation in progress. Consider starting anticoagulation.  Once cleared from GI and GU point of view. Follow serial H&H   LOS: 8 days    Charolette Forward 04/22/2015, 2:52 PM

## 2015-04-22 NOTE — Consult Note (Signed)
Reason for Consult: Anemia Referring Physician: [facility]  [place]. HPI: This is a 74 year old male admitted with fatigue and weakness.  Further work up revealed that he was in afib with RVR, renal failure, pneumonia, UTI, and bilateral hydronephrosis.  Cardiology, Nephrology, and Urology were consulted.  Over the intervening time period the patient's clinical status has improved, however, his HGB has dropped.  A rectal examination was performed and he was heme negative, although his stools were dark.  On 10/07/2009 he underwent an EGD/Colonoscopy for further evaluation of anemia.  The EGD revealed Cameron's lesions and his colonoscopy was significant for a couple of adenomas and diverticula.  He has not followed up in the office since that time.  The patient was noted to be using Goody's Powder prior to his admission.  The plan is for him to be on long term anticoagulation with coumadin.  The patient had some issues with hematuria, but that has resolved.  Past Medical History  Diagnosis Date  . Hypertension     Past Surgical History  Procedure Laterality Date  . Tonsillectomy      Family History  Problem Relation Age of Onset  . Breast cancer Mother     Social History:  reports that he has never smoked. He does not have any smokeless tobacco history on file. He reports that he does not drink alcohol or use illicit drugs.  Allergies: No Known Allergies  Medications:  Scheduled: . sodium chloride   Intravenous Once  . amiodarone  200 mg Oral BID  . antiseptic oral rinse  7 mL Mouth Rinse BID  . cefpodoxime  200 mg Oral Q12H  . diltiazem  240 mg Oral Daily  . feeding supplement (ENSURE ENLIVE)  237 mL Oral BID BM  . finasteride  5 mg Oral Daily  . pantoprazole  40 mg Oral Q1200  . sodium chloride  3 mL Intravenous Q12H  . tamsulosin  0.4 mg Oral Daily   Continuous: . sodium chloride 0.9 % 1,000 mL infusion 10 mL/hr at 04/21/15 1025    Results for orders placed  or performed during the hospital encounter of 04/14/15 (from the past 24 hour(s))  Culture, blood (routine x 2)     Status: None (Preliminary result)   Collection Time: 04/21/15  5:10 PM  Result Value Ref Range   Specimen Description BLOOD LEFT HAND    Special Requests BOTTLES DRAWN AEROBIC ONLY  5CC    Culture NO GROWTH < 24 HOURS    Report Status PENDING   Culture, blood (routine x 2)     Status: None (Preliminary result)   Collection Time: 04/21/15  5:19 PM  Result Value Ref Range   Specimen Description BLOOD RIGHT ANTECUBITAL    Special Requests BOTTLES DRAWN AEROBIC ONLY 5CC    Culture NO GROWTH < 24 HOURS    Report Status PENDING   Protime-INR     Status: None   Collection Time: 04/21/15  5:19 PM  Result Value Ref Range   Prothrombin Time 14.6 11.6 - 15.2 seconds   INR 1.12 0.00 - 99991111  Basic metabolic panel     Status: Abnormal   Collection Time: 04/22/15  3:14 AM  Result Value Ref Range   Sodium 139 135 - 145 mmol/L   Potassium 3.4 (L) 3.5 - 5.1 mmol/L   Chloride 103 101 - 111 mmol/L   CO2 24 22 - 32 mmol/L   Glucose, Bld 120 (H) 65 - 99  mg/dL   BUN 41 (H) 6 - 20 mg/dL   Creatinine, Ser 2.50 (H) 0.61 - 1.24 mg/dL   Calcium 8.6 (L) 8.9 - 10.3 mg/dL   GFR calc non Af Amer 24 (L) >60 mL/min   GFR calc Af Amer 28 (L) >60 mL/min   Anion gap 12 5 - 15  CBC     Status: Abnormal   Collection Time: 04/22/15  3:14 AM  Result Value Ref Range   WBC 9.5 4.0 - 10.5 K/uL   RBC 2.32 (L) 4.22 - 5.81 MIL/uL   Hemoglobin 7.3 (L) 13.0 - 17.0 g/dL   HCT 21.8 (L) 39.0 - 52.0 %   MCV 94.0 78.0 - 100.0 fL   MCH 31.5 26.0 - 34.0 pg   MCHC 33.5 30.0 - 36.0 g/dL   RDW 13.1 11.5 - 15.5 %   Platelets 260 150 - 400 K/uL  Protime-INR     Status: None   Collection Time: 04/22/15  3:14 AM  Result Value Ref Range   Prothrombin Time 15.0 11.6 - 15.2 seconds   INR 1.16 0.00 - 1.49  Type and screen     Status: None (Preliminary result)   Collection Time: 04/22/15 10:13 AM  Result Value Ref  Range   ABO/RH(D) O POS    Antibody Screen NEG    Sample Expiration 04/25/2015    Unit Number [id number]    Blood Component Type RED CELLS,LR    Unit division 00    Status of Unit ISSUED    Transfusion Status OK TO TRANSFUSE    Crossmatch Result Compatible    Unit Number [id number]    Blood Component Type RBC LR PHER2    Unit division 00    Status of Unit ISSUED    Transfusion Status OK TO TRANSFUSE    Crossmatch Result Compatible   Prepare RBC     Status: None   Collection Time: 04/22/15 10:13 AM  Result Value Ref Range   Order Confirmation ORDER PROCESSED BY BLOOD BANK      No results found.  ROS:  As stated above in the HPI otherwise negative.  Blood pressure 119/81, pulse 75, temperature 98.2 F (36.8 C), temperature source Oral, resp. rate 18, height 5\' 4"  (1.626 m), weight 88 kg (194 lb 0.1 oz), SpO2 95 %.    PE: Gen: NAD, Alert and Oriented HEENT:  [place]/AT, EOMI Neck: Supple, no LAD Lungs: CTA Bilaterally CV: RRR without M/G/R ABM: Soft, NTND, +BS Ext: No C/C/E  Assessment/Plan: 1) Anemia. 2) Afib requiring long term anticoagulation. 3) Renal insufficiency - improving. 4) Bilateral hydronephrosis.   The patient has improved clinically and he is stable for an EGD.  With the prior findings on his 10/07/2009 EGD I will repeat the examination.  His anemia may have worsened from the recent hematuria, which appears to be have resolved.  Plan: 1) EGD tomorrow.  Vermell Madrid D 04/22/2015, 3:35 PM

## 2015-04-22 NOTE — Progress Notes (Signed)
UR Completed. Kody Brandl, RN, BSN.  [phone] 

## 2015-04-22 NOTE — Progress Notes (Signed)
[facility] PROGRESS NOTE  [name] [id number] DOB: 11/24/1940 DOA: 04/14/2015  PCP: [name], MD   Brief HPI: 74 year old Caucasian male with a past medical history of hypertension who denies taking any medications at home, presented with complaints of weakness and fatigue. He was found to be in atrial fibrillation with RVR. He had a temperature of 102F, likely secondary to pneumonia. He also was noted to be in renal failure. Patient was hospitalized for further management. Patient was seen by cardiology. He was placed on a Cardizem infusion. He was also started on amiodarone. Renal function started improving. Patient then developed mild hematuria. Seen by urology.  Past medical history:  Past Medical History  Diagnosis Date  . Hypertension     Consultants: Cardiology, Dr. Terrence Dupont. Nephrology. Urology. Gastroenterology  Procedures:  2-D echocardiogram Study Conclusions - Left ventricle: The cavity size was normal. Wall thickness wasincreased in a pattern of mild LVH. Systolic function was normal.The estimated ejection fraction was in the range of 55% to 60%.Wall motion was normal; there were no regional wall motionabnormalities. Left ventricular diastolic function parameterswere normal. - Aortic valve: There was trivial regurgitation. - Atrial septum: No defect or patent foramen ovale was identified.  Antibiotics: Zosyn till 7/28 Ceftriaxone 7/28--8/1 Vantin 8/1  Subjective: Patient feels well. Some fatigue No chest pain or shortness of breath.    Objective: Vital Signs  Filed Vitals:   04/20/15 2100 04/21/15 0509 04/21/15 1958 04/22/15 0352  BP:  175/83 142/76 138/59  Pulse:  79 77 79  Temp:  97.8 F (36.6 C) 98.1 F (36.7 C) 98 F (36.7 C)  TempSrc:  Oral Oral Oral  Resp:  20 21 19   Height:      Weight:    88 kg (194 lb 0.1 oz)  SpO2: 99%  100% 100%    Intake/Output Summary (Last 24 hours) at 04/22/15 0923 Last data filed at  04/22/15 0900  Gross per 24 hour  Intake    480 ml  Output   3075 ml  Net  -2595 ml   Filed Weights   04/19/15 0400 04/20/15 0409 04/22/15 0352  Weight: 87.7 kg (193 lb 5.5 oz) 87.4 kg (192 lb 10.9 oz) 88 kg (194 lb 0.1 oz)    General appearance: alert, cooperative, appears stated age and no distress Resp: Clear to auscultation bilaterally.  Cardio: S1, S2 regular. No S3, S4. No rubs, murmurs, or bruit. No pedal edema. GI: soft, non-tender; bowel sounds normal; no masses,  no organomegaly Extremities: extremities normal, atraumatic, no cyanosis or edema Neurologic: No focal deficits.  Lab Results:  Basic Metabolic Panel:  Recent Labs Lab 04/16/15 0249 04/17/15 0234 04/18/15 0229 04/19/15 0234 04/20/15 0441 04/21/15 0443 04/22/15 0314  NA 137 140 141 139 143 141 139  K 4.0 3.6 3.2* 2.8* 3.3* 3.4* 3.4*  CL 106 108 107 108 108 108 103  CO2 16* 19* 21* 22 21* 22 24  GLUCOSE 118* 131* 133* 116* 108* 115* 120*  BUN 94* 92* 80* 62* 56* 43* 41*  CREATININE 6.45* 5.43* 4.54* 3.48* 3.18* 2.64* 2.50*  CALCIUM 8.3* 8.3* 8.3* 7.2* 8.5* 8.5* 8.6*  PHOS 6.7* 5.7* 5.0* 3.5  --   --   --     Liver Function Tests:  Recent Labs Lab 04/16/15 0249 04/17/15 0234 04/18/15 0229 04/19/15 0234 04/19/15 1828  ALKPHOS  --   --   --   --  59  ALBUMIN 2.8* 2.4* 2.5* 2.3*  --  CBC:  Recent Labs Lab 04/19/15 0234 04/19/15 1428 04/20/15 0441 04/21/15 0443 04/22/15 0314  WBC 7.6 8.6 8.7 8.7 9.5  HGB 7.4* 9.3* 9.3* 8.5* 7.3*  HCT 21.8* 27.8* 27.9* 24.7* 21.8*  MCV 92.8 94.6 95.2 94.6 94.0  PLT 166 188 214 223 260    Cardiac Enzymes: No results for input(s): CKTOTAL, CKMB, CKMBINDEX, TROPONINI in the last 168 hours.  Recent Results (from the past 240 hour(s))  Blood culture (routine x 2)     Status: None   Collection Time: 04/14/15  5:15 PM  Result Value Ref Range Status   Specimen Description BLOOD LEFT ANTECUBITAL  Final   Special Requests BOTTLES DRAWN AEROBIC AND  ANAEROBIC 5CC EACH  Final   Culture  Setup Time   Final    GRAM NEGATIVE RODS IN BOTH AEROBIC AND ANAEROBIC BOTTLES CRITICAL RESULT CALLED TO, READ BACK BY AND VERIFIED WITH: A AGUIRRE,RN AT [id number] 04/15/15 BY L BENFIELD    Culture   Final    ESCHERICHIA COLI SUSCEPTIBILITIES PERFORMED ON PREVIOUS CULTURE WITHIN THE LAST 5 DAYS. Performed at [facility]    Report Status 04/17/2015 FINAL  Final  Blood culture (routine x 2)     Status: None   Collection Time: 04/14/15  5:30 PM  Result Value Ref Range Status   Specimen Description BLOOD RIGHT ARM  Final   Special Requests BOTTLES DRAWN AEROBIC ONLY 4CC  Final   Culture  Setup Time   Final    GRAM NEGATIVE RODS AEROBIC BOTTLE ONLY CRITICAL RESULT CALLED TO, READ BACK BY AND VERIFIED WITH: C WHITE,RN AT 1055 04/15/15 BY L BENFIELD    Culture   Final    ESCHERICHIA COLI Performed at [facility]    Report Status 04/17/2015 FINAL  Final   Organism ID, Bacteria ESCHERICHIA COLI  Final      Susceptibility   Escherichia coli - MIC*    AMPICILLIN <=2 SENSITIVE Sensitive     CEFAZOLIN <=4 SENSITIVE Sensitive     CEFEPIME <=1 SENSITIVE Sensitive     CEFTAZIDIME <=1 SENSITIVE Sensitive     CEFTRIAXONE <=1 SENSITIVE Sensitive     CIPROFLOXACIN <=0.25 SENSITIVE Sensitive     GENTAMICIN <=1 SENSITIVE Sensitive     IMIPENEM <=0.25 SENSITIVE Sensitive     TRIMETH/SULFA <=20 SENSITIVE Sensitive     AMPICILLIN/SULBACTAM <=2 SENSITIVE Sensitive     PIP/TAZO <=4 SENSITIVE Sensitive     * ESCHERICHIA COLI  MRSA PCR Screening     Status: None   Collection Time: 04/14/15  9:50 PM  Result Value Ref Range Status   MRSA by PCR NEGATIVE NEGATIVE Final    Comment:        The GeneXpert MRSA Assay (FDA approved for NASAL specimens only), is one component of a comprehensive MRSA colonization surveillance program. It is not intended to diagnose MRSA infection nor to guide or monitor treatment for MRSA infections.   Culture, Urine      Status: None   Collection Time: 04/17/15  8:13 AM  Result Value Ref Range Status   Specimen Description URINE, CATHETERIZED  Final   Special Requests NONE  Final   Culture NO GROWTH 1 DAY  Final   Report Status 04/18/2015 FINAL  Final      Studies/Results: No results found.  Medications:  Scheduled: . sodium chloride   Intravenous Once  . sodium chloride   Intravenous Once  . amiodarone  200 mg Oral BID  . antiseptic oral  rinse  7 mL Mouth Rinse BID  . cefpodoxime  200 mg Oral Q12H  . diltiazem  240 mg Oral Daily  . feeding supplement (ENSURE ENLIVE)  237 mL Oral BID BM  . finasteride  5 mg Oral Daily  . furosemide  20 mg Intravenous Once  . potassium chloride  40 mEq Oral Once  . sodium chloride  3 mL Intravenous Q12H  . tamsulosin  0.4 mg Oral Daily   Continuous: . sodium chloride 0.9 % 1,000 mL infusion 10 mL/hr at 04/21/15 1025   [id number] **OR** acetaminophen, ondansetron **OR** ondansetron (ZOFRAN) IV  Assessment/Plan:  Principal Problem:   Sepsis Active Problems:   Atrial fibrillation with RVR   CAP (community acquired pneumonia)   Essential hypertension   Elevated troponin   Normocytic hypochromic anemia   CKD (chronic kidney disease) stage 4, GFR 15-29 ml/min   Gram-negative bacteremia   AKI (acute kidney injury)   Acute renal failure   Hematuria    Sepsis with Escherichia coli bacteremia Source is likely UTI. Unfortunately, urine culture was not sent at the time of admission. Chest x-ray also suggested pneumonia. Patient was initially started on ceftriaxone and azithromycin. Was changed over to [facility] 7/26. Blood cultures growing Escherichia coli. Sensitivities have been noted. Patient changed over to ceftriaxone. Was initially noted to have high pro-calcitonin levels. Lactic acid level was normal. Changed to oral Vantin. Will need antibiotics for at least 2 weeks total.  Acute renal failure with bilateral hydronephrosis Creatinine continues to  improve. Has good urine output. Nephrology has signed off. Renal ultrasound showed bilateral hydronephrosis. Case had been discussed with urology as well.  Continue IV fluids. Patient was taking NSAIDs at home. Okay to stop IV fluids.  Hematuria Hematuria appears to have resolved. Plan is to continue Foley catheter at the time of discharge and outpatient follow-up. She was started on tamsulosin and finasteride. Discussed with Dr. Gaynelle Arabian yesterday. From his perspective, it is okay to start anticoagulation.  Anemia of chronic disease Hemoglobin has reduced further. He did have another bowel movement last night, but is not sure if it was black. He did have dark stool yesterday morning, which I did not witness. Rectal exam done revealed dark stool but it was heme-negative. Now that his hemoglobin is trending down and the fact that he will need anticoagulation he might benefit from a GI workup. Discussed with Dr. Carol Ada who has seen the patient in the past. Patient underwent colonoscopy and upper endoscopy about 5-1/2 years ago. He will consult on this patient and plan for upper endoscopy tomorrow. Patient is agreeable to blood transfusion today. He'll be transfused 2 units.  Atrial fibrillation/atrial flutter with RVR Currently in sinus rhythm. Cardiology is following. Patient is on amiodarone as well as oral Cardizem now. Warfarin on hold until GI workup is completed. Change to long-acting Cardizem.  Questionable left lower lobe pneumonia Will need follow-up chest x-ray in 4-6 weeks. Continue antibiotics. Changed to oral agents.  Elevated troponin, likely secondary to demand ischemia Multifactorial due to sepsis, acute renal failure, atrial fibrillation. No wall motion abnormality noted on echocardiogram. Cardiology is following.   DVT Prophylaxis: SCDs Code Status: DO NOT RESUSCITATE  Family Communication: Discussed with the patient and his wife Disposition Plan: PT and OT recommends home  health. Anticipate discharge in 1-2 days.  Follow-up Appointment?: We'll need follow-up with Dr. [name], along with urology (Dr. Gaynelle Arabian) and cardiology (Dr. Terrence Dupont).   LOS: 8 days   [name]  [facility] Pager [phone] 04/22/2015, 9:23 AM  If 7PM-7AM, please contact night-coverage at [email or web address], password [facility]

## 2015-04-23 ENCOUNTER — Encounter (HOSPITAL_COMMUNITY): Payer: Self-pay | Admitting: *Deleted

## 2015-04-23 ENCOUNTER — Encounter (HOSPITAL_COMMUNITY)
Admission: EM | Disposition: A | Discharge status: Home / Self Care | Payer: Self-pay | Source: Home / Self Care | Attending: Internal Medicine

## 2015-04-23 DIAGNOSIS — D509 Iron deficiency anemia, unspecified: Secondary | ICD-10-CM

## 2015-04-23 DIAGNOSIS — R319 Hematuria, unspecified: Secondary | ICD-10-CM

## 2015-04-23 DIAGNOSIS — N390 Urinary tract infection, site not specified: Secondary | ICD-10-CM

## 2015-04-23 DIAGNOSIS — I4891 Unspecified atrial fibrillation: Secondary | ICD-10-CM

## 2015-04-23 DIAGNOSIS — R7989 Other specified abnormal findings of blood chemistry: Secondary | ICD-10-CM

## 2015-04-23 DIAGNOSIS — N179 Acute kidney failure, unspecified: Secondary | ICD-10-CM

## 2015-04-23 DIAGNOSIS — A419 Sepsis, unspecified organism: Secondary | ICD-10-CM

## 2015-04-23 DIAGNOSIS — R7881 Bacteremia: Secondary | ICD-10-CM

## 2015-04-23 DIAGNOSIS — I10 Essential (primary) hypertension: Secondary | ICD-10-CM

## 2015-04-23 DIAGNOSIS — A4150 Gram-negative sepsis, unspecified: Secondary | ICD-10-CM

## 2015-04-23 HISTORY — PX: ESOPHAGOGASTRODUODENOSCOPY: SHX5428

## 2015-04-23 HISTORY — DX: Sepsis, unspecified organism: A41.9

## 2015-04-23 LAB — TYPE AND SCREEN
ABO/RH(D): O POS
ANTIBODY SCREEN: NEGATIVE
Unit division: 0
Unit division: 0

## 2015-04-23 LAB — BASIC METABOLIC PANEL
Anion gap: 11 (ref 5–15)
BUN: 42 mg/dL — ABNORMAL HIGH (ref 6–20)
CALCIUM: 8.7 mg/dL — AB (ref 8.9–10.3)
CO2: 26 mmol/L (ref 22–32)
Chloride: 104 mmol/L (ref 101–111)
Creatinine, Ser: 2.58 mg/dL — ABNORMAL HIGH (ref 0.61–1.24)
GFR calc non Af Amer: 23 mL/min — ABNORMAL LOW (ref >60)
GFR, EST AFRICAN AMERICAN: 27 mL/min — AB (ref >60)
GLUCOSE: 89 mg/dL (ref 65–99)
Potassium: 3.6 mmol/L (ref 3.5–5.1)
Sodium: 141 mmol/L (ref 135–145)

## 2015-04-23 LAB — CBC
HEMATOCRIT: 26.7 % — AB (ref 39.0–52.0)
Hemoglobin: 9 g/dL — ABNORMAL LOW (ref 13.0–17.0)
MCH: 30.5 pg (ref 26.0–34.0)
MCHC: 33.7 g/dL (ref 30.0–36.0)
MCV: 90.5 fL (ref 78.0–100.0)
Platelets: 289 10*3/uL (ref 150–400)
RBC: 2.95 MIL/uL — ABNORMAL LOW (ref 4.22–5.81)
RDW: 15.3 % (ref 11.5–15.5)
WBC: 10.2 10*3/uL (ref 4.0–10.5)

## 2015-04-23 LAB — PROTIME-INR
INR: 1.11 (ref 0.00–1.49)
Prothrombin Time: 14.5 seconds (ref 11.6–15.2)

## 2015-04-23 SURGERY — EGD (ESOPHAGOGASTRODUODENOSCOPY)
Anesthesia: Moderate Sedation

## 2015-04-23 MED ORDER — WARFARIN SODIUM 5 MG PO TABS: 5.0000 mg | ORAL_TABLET | Freq: Every day | ORAL | Status: DC

## 2015-04-23 MED ORDER — FINASTERIDE 5 MG PO TABS: 5.0000 mg | ORAL_TABLET | Freq: Every day | ORAL | Status: DC

## 2015-04-23 MED ORDER — CEFPODOXIME PROXETIL 200 MG PO TABS: 200.0000 mg | ORAL_TABLET | Freq: Two times a day (BID) | ORAL | Status: DC

## 2015-04-23 MED ORDER — MIDAZOLAM HCL 5 MG/ML IJ SOLN
INTRAMUSCULAR | Status: AC
  Filled 2015-04-23: qty 2

## 2015-04-23 MED ORDER — MIDAZOLAM HCL 10 MG/2ML IJ SOLN
INTRAMUSCULAR | Status: DC | PRN
  Administered 2015-04-23: 1 mg via INTRAVENOUS
  Administered 2015-04-23: 2 mg via INTRAVENOUS

## 2015-04-23 MED ORDER — AMIODARONE HCL 200 MG PO TABS: 200.0000 mg | ORAL_TABLET | Freq: Two times a day (BID) | ORAL | Status: DC

## 2015-04-23 MED ORDER — FENTANYL CITRATE (PF) 100 MCG/2ML IJ SOLN
INTRAMUSCULAR | Status: DC | PRN
  Administered 2015-04-23: 25 ug via INTRAVENOUS

## 2015-04-23 MED ORDER — PANTOPRAZOLE SODIUM 40 MG PO TBEC
40.0000 mg | DELAYED_RELEASE_TABLET | Freq: Every day | ORAL | Status: DC
Start: 2015-04-23 — End: 2020-02-05

## 2015-04-23 MED ORDER — FENTANYL CITRATE (PF) 100 MCG/2ML IJ SOLN
INTRAMUSCULAR | Status: AC
  Filled 2015-04-23: qty 2

## 2015-04-23 MED ORDER — PATIENT'S GUIDE TO USING COUMADIN BOOK
Freq: Once | Status: AC
  Administered 2015-04-23: 15:00:00
  Filled 2015-04-23: qty 1

## 2015-04-23 MED ORDER — TAMSULOSIN HCL 0.4 MG PO CAPS: 0.4000 mg | ORAL_CAPSULE | Freq: Every day | ORAL | Status: DC

## 2015-04-23 MED ORDER — DILTIAZEM HCL ER COATED BEADS 240 MG PO CP24: 240.0000 mg | ORAL_CAPSULE | Freq: Every day | ORAL | Status: DC

## 2015-04-23 NOTE — Care Management Note (Addendum)
Case Management Note  Patient Details  Name: [name] MRN: [id number] Date of Birth: October 29, 1940  Subjective/Objective:     Pt admitted with sepsis               Action/Plan:  Pt independent from home with wife, pt does not currently receive any HH.  CM will monitor for disposition needs   Expected Discharge Date:                  Expected Discharge Plan:  Home/Self Care  In-House Referral:     Discharge planning Services  CM Consult  Post Acute Care Choice:    Choice offered to:  Patient  DME Arranged:    DME Agency:     HH Arranged:  PT HH Agency:  [facility]  Status of Service:  In process, will continue to follow  Medicare Important Message Given:  Yes-fourth notification given Date Medicare IM Given:    Medicare IM give by:    Date Additional Medicare IM Given:    Additional Medicare Important Message give by:     If discussed at [place] of Stay Meetings, dates discussed:    Additional Comments: CM assessed pt, gave pt choice, pt and wife chose [place], CM contacted agency, referral was accepted.  Pt refused walker recommendation stating he already had one at home.  Per MD; pt will discharge with Foley, no HH needed to manage foley, pt will follow up with Urology.  Wife will provide 24 hour supervision post discharge. Maryclare Labrador, RN 04/23/2015, 11:21 AM

## 2015-04-23 NOTE — Discharge Summary (Addendum)
Physician Discharge Summary  [place]. [id number] DOB: [birth year] DOA: 04/14/2015  PCP: [name], MD  Admit date: 04/14/2015 Discharge date: 04/23/2015  Time spent: 20 minutes  Recommendations for Outpatient Follow-up:  1. Please check INR within 3-5 days 2. Please repeat Renal Panel within one week, focus on renal function 3. Please check follow-up chest x-ray in 4-6 weeks 4. Follow up with PCP 5. Follow up with Urology as scheduled 6. Follow up with Cardiology as scheduled  Discharge Diagnoses:  Principal Problem:   Sepsis secondary to UTI Active Problems:   Atrial fibrillation with RVR   CAP (community acquired pneumonia)   Essential hypertension   Sepsis   Elevated troponin   Normocytic hypochromic anemia   CKD (chronic kidney disease) stage 4, GFR 15-29 ml/min   Gram-negative bacteremia   AKI (acute kidney injury)   Acute renal failure   Hematuria   Discharge Condition: Improved  Diet recommendation: Heart healthy  Filed Weights   04/20/15 0409 04/22/15 0352 04/23/15 0413  Weight: 87.4 kg (192 lb 10.9 oz) 88 kg (194 lb 0.1 oz) 88.451 kg (195 lb)    History of present illness:  Please see admit h and p from 7/25 for details. Briefly, pt presented initially with weakness and fatigue, found to be febrile with ARF and in respiratory distress. Patient was admitted initially with concerns for sepsis with possible pneumonia.  Hospital Course:  Sepsis with Escherichia coli bacteremia Source is more likely UTI. Unfortunately, urine culture was not sent at the time of admission. Chest x-ray also suggested pneumonia. Patient was initially started on ceftriaxone and azithromycin. Was changed over to [facility] 7/26. Blood cultures growing Escherichia coli. Sensitivities have been noted. Patient changed over to ceftriaxone. Was initially noted to have high pro-calcitonin levels. Lactic acid level was normal. Changed to oral Vantin. Will need antibiotics for at least 2  weeks total.  Acute renal failure with bilateral hydronephrosis Creatinine continues to improve. Patient with good urine output. Nephrology had signed off. Renal ultrasound showed bilateral hydronephrosis. Case had been discussed with urology as well. Continue IV fluids. Patient was taking NSAIDs at home. Okay to stop IV fluids. Patient to continue foley cath, bladder training and BPH work up as outpatient  Hematuria Hematuria appears to have resolved. Plan is to continue Foley catheter at the time of discharge and outpatient follow-up. She was started on tamsulosin and finasteride. Urology reported it is okay to start anticoagulation.  Anemia of chronic disease Hemoglobin has reduced further. He did have another bowel movement last night, but is not sure if it was black. He did have dark stool yesterday morning, which I did not witness. Rectal exam done revealed dark stool but it was heme-negative. The case was discussed with Dr. Carol Ada who has seen the patient in the past. Patient underwent upper endoscopy 8/3 with findings of hiatal hernia. Patient was transfused 2 units.  Atrial fibrillation/atrial flutter with RVR Remained in sinus rhythm. Cardiology had consulted. Patient is on amiodarone as well as oral Cardizem now. Warfarin on hold until GI workup is completed. Change to long-acting Cardizem.  Questionable left lower lobe pneumonia Will need follow-up chest x-ray in 4-6 weeks. Continue antibiotics. Changed to oral agents.  Elevated troponin, likely secondary to demand ischemia Multifactorial due to sepsis, acute renal failure, atrial fibrillation. No wall motion abnormality noted on echocardiogram. Cardiology was following.   Procedures:  EGD 8/3  Consultations:  GI  Cardiology  Urology  Nephrology  Discharge Exam:  Filed Vitals:   04/23/15 0754 04/23/15 0800 04/23/15 0810 04/23/15 1444  BP:  119/52 132/68 136/62  Pulse: 71 69 67 77  Temp: 98 F (36.7 C)   98 F  (36.7 C)  TempSrc: Oral   Oral  Resp: 16 13 14 15   Height:      Weight:      SpO2: 99% 97% 96% 100%    General: awake, in nad Cardiovascular: regular, s1, s2 Respiratory: normal resp effort, no wheezing  Discharge Instructions     Medication List    STOP taking these medications        aspirin 325 MG tablet     GOODY BODY PAIN 500-325 MG Pack  Generic drug:  Aspirin-Acetaminophen      TAKE these medications        amiodarone 200 MG tablet  Commonly known as:  PACERONE  Take 1 tablet (200 mg total) by mouth 2 (two) times daily.     cefpodoxime 200 MG tablet  Commonly known as:  VANTIN  Take 1 tablet (200 mg total) by mouth every 12 (twelve) hours.     diltiazem 240 MG 24 hr capsule  Commonly known as:  CARDIZEM CD  Take 1 capsule (240 mg total) by mouth daily.     finasteride 5 MG tablet  Commonly known as:  PROSCAR  Take 1 tablet (5 mg total) by mouth daily.     pantoprazole 40 MG tablet  Commonly known as:  PROTONIX  Take 1 tablet (40 mg total) by mouth daily at 12 noon.     tamsulosin 0.4 MG Caps capsule  Commonly known as:  FLOMAX  Take 1 capsule (0.4 mg total) by mouth daily.     warfarin 5 MG tablet  Commonly known as:  COUMADIN  Take 1 tablet (5 mg total) by mouth daily.       No Known Allergies Follow-up Information    Follow up with [facility].   Why:  Physical therapy   Contact information:   [address] [place] [address] [phone]       Follow up with [name], MD. Schedule an appointment as soon as possible for a visit in 1 week.   Specialty:  Internal Medicine   Why:  Hospital follow up   Contact information:   [address] [place] [address] [phone]       Follow up with Charolette Forward, MD. Schedule an appointment as soon as possible for a visit in 1 week.   Specialty:  Cardiology   Why:  Hospital follow up   Contact information:   [place] [address] [place] [address] [phone]       Schedule an appointment as soon as possible for a visit with Ailene Rud, MD.   Specialty:  Urology   Why:  Hospital follow up   Contact information:   [place]  [id number] [phone]        The results of significant diagnostics from this hospitalization (including imaging, microbiology, ancillary and laboratory) are listed below for reference.    Significant Diagnostic Studies: US Abdomen Complete  04/15/2015   CLINICAL DATA:  74 year old hypertensive male with abdominal pain, nausea, vomiting diarrhea with loss of appetite worse over the past week. Initial encounter.  EXAM: ULTRASOUND ABDOMEN COMPLETE  COMPARISON:  None.  FINDINGS: Gallbladder: No gallstones or wall thickening visualized. No sonographic Murphy sign noted.  Common  bile duct: Diameter: 4.1 mm.  Liver: Mild increased echogenicity may represent mild fatty infiltration without mass identified.  IVC: No abnormality visualized.  Pancreas: Poorly delineated secondary to bowel gas.  Spleen: Size and appearance within normal limits.  Right Kidney: Length: 14.3 cm.  Hydronephrosis.  No obvious mass.  Left Kidney: Length: 14.5 cm.  Hydronephrosis.  No obvious mass.  Abdominal aorta: Poorly delineated secondary to bowel gas. Proximal aspect measures up to 3 cm.  Other findings: None.  IMPRESSION: Bilateral prominent hydronephrosis.  Etiology indeterminate.  Poor delineation of the pancreas and abdominal aorta secondary to bowel gas. Proximal abdominal aorta measures up to 3 cm.  Question mild fatty infiltration the liver.   Electronically Signed   By: Genia Del M.D.   On: 04/15/2015 07:21   US Pelvis Limited  04/15/2015   CLINICAL DATA:  Acute urine retention.  EXAM: LIMITED ULTRASOUND OF PELVIS  TECHNIQUE: Limited transabdominal ultrasound examination of the pelvis was performed.  COMPARISON:  None.  FINDINGS: The urinary bladder has a normal appearance for the  degree of distention.  Pre-void volume:  814.8 ml  Post-void volume:  Patient unable to void.  Other findings: Mild echogenic debris present within the bladder with mild patchy wall irregularity over the left lateral aspect and dependent portion.  IMPRESSION: Bladder volume 814.8 mL as patient unable to void. Mild internal echogenic debris within the bladder as well as patchy wall irregularity along the left lateral aspect and dependent portion which may be due to infection/inflammatory process or hemorrhagic debris and less likely neoplastic process.   Electronically Signed   By: Marin Olp M.D.   On: 04/15/2015 08:22   Dg Chest Portable 1 View  04/14/2015   CLINICAL DATA:  74 year old male with nausea, vomiting and diarrhea gradually worsening over the past week. Atrial fibrillation.  EXAM: PORTABLE CHEST - 1 VIEW  COMPARISON:  No priors.  FINDINGS: Lung volumes are low. Ill-defined opacity at the left lung base obscuring the left hemidiaphragm. No definite pleural effusions. Heart size is upper limits of normal. The patient is rotated to the left on today's exam, resulting in distortion of the mediastinal contours and reduced diagnostic sensitivity and specificity for mediastinal pathology.  IMPRESSION: 1. Left lower lobe opacity concerning for pneumonia. Followup PA and lateral chest X-ray is recommended in 3-4 weeks following trial of antibiotic therapy to ensure resolution and exclude underlying malignancy.   Electronically Signed   By: Vinnie Langton M.D.   On: 04/14/2015 16:41    Microbiology: Recent Results (from the past 240 hour(s))  Blood culture (routine x 2)     Status: None   Collection Time: 04/14/15  5:15 PM  Result Value Ref Range Status   Specimen Description BLOOD LEFT ANTECUBITAL  Final   Special Requests BOTTLES DRAWN AEROBIC AND ANAEROBIC 5CC EACH  Final   Culture  Setup Time   Final    GRAM NEGATIVE RODS IN BOTH AEROBIC AND ANAEROBIC BOTTLES CRITICAL RESULT CALLED TO,  READ BACK BY AND VERIFIED WITH: A AGUIRRE,RN AT [id number] 04/15/15 BY L BENFIELD    Culture   Final    ESCHERICHIA COLI SUSCEPTIBILITIES PERFORMED ON PREVIOUS CULTURE WITHIN THE LAST 5 DAYS. Performed at [facility]    Report Status 04/17/2015 FINAL  Final  Blood culture (routine x 2)     Status: None   Collection Time: 04/14/15  5:30 PM  Result Value Ref Range Status   Specimen Description BLOOD RIGHT ARM  Final  Special Requests BOTTLES DRAWN AEROBIC ONLY 4CC  Final   Culture  Setup Time   Final    GRAM NEGATIVE RODS AEROBIC BOTTLE ONLY CRITICAL RESULT CALLED TO, READ BACK BY AND VERIFIED WITH: C WHITE,RN AT [id number] 04/15/15 BY L BENFIELD    Culture   Final    ESCHERICHIA COLI Performed at [facility]    Report Status 04/17/2015 FINAL  Final   Organism ID, Bacteria ESCHERICHIA COLI  Final      Susceptibility   Escherichia coli - MIC*    AMPICILLIN <=2 SENSITIVE Sensitive     CEFAZOLIN <=4 SENSITIVE Sensitive     CEFEPIME <=1 SENSITIVE Sensitive     CEFTAZIDIME <=1 SENSITIVE Sensitive     CEFTRIAXONE <=1 SENSITIVE Sensitive     CIPROFLOXACIN <=0.25 SENSITIVE Sensitive     GENTAMICIN <=1 SENSITIVE Sensitive     IMIPENEM <=0.25 SENSITIVE Sensitive     TRIMETH/SULFA <=20 SENSITIVE Sensitive     AMPICILLIN/SULBACTAM <=2 SENSITIVE Sensitive     PIP/TAZO <=4 SENSITIVE Sensitive     * ESCHERICHIA COLI  MRSA PCR Screening     Status: None   Collection Time: 04/14/15  9:50 PM  Result Value Ref Range Status   MRSA by PCR NEGATIVE NEGATIVE Final    Comment:        The GeneXpert MRSA Assay (FDA approved for NASAL specimens only), is one component of a comprehensive MRSA colonization surveillance program. It is not intended to diagnose MRSA infection nor to guide or monitor treatment for MRSA infections.   Culture, Urine     Status: None   Collection Time: 04/17/15  8:13 AM  Result Value Ref Range Status   Specimen Description URINE, CATHETERIZED  Final   Special  Requests NONE  Final   Culture NO GROWTH 1 DAY  Final   Report Status 04/18/2015 FINAL  Final  Culture, blood (routine x 2)     Status: None (Preliminary result)   Collection Time: 04/21/15  5:10 PM  Result Value Ref Range Status   Specimen Description BLOOD LEFT HAND  Final   Special Requests BOTTLES DRAWN AEROBIC ONLY  5CC  Final   Culture NO GROWTH 2 DAYS  Final   Report Status PENDING  Incomplete  Culture, blood (routine x 2)     Status: None (Preliminary result)   Collection Time: 04/21/15  5:19 PM  Result Value Ref Range Status   Specimen Description BLOOD RIGHT ANTECUBITAL  Final   Special Requests BOTTLES DRAWN AEROBIC ONLY 5CC  Final   Culture NO GROWTH 2 DAYS  Final   Report Status PENDING  Incomplete     Labs: Basic Metabolic Panel:  Recent Labs Lab 04/17/15 0234 04/18/15 0229 04/19/15 0234 04/20/15 0441 04/21/15 0443 04/22/15 0314 04/23/15 0432  NA 140 141 139 143 141 139 141  K 3.6 3.2* 2.8* 3.3* 3.4* 3.4* 3.6  CL 108 107 108 108 108 103 104  CO2 19* 21* 22 21* 22 24 26   GLUCOSE 131* 133* 116* 108* 115* 120* 89  BUN 92* 80* 62* 56* 43* 41* 42*  CREATININE 5.43* 4.54* 3.48* 3.18* 2.64* 2.50* 2.58*  CALCIUM 8.3* 8.3* 7.2* 8.5* 8.5* 8.6* 8.7*  PHOS 5.7* 5.0* 3.5  --   --   --   --    Liver Function Tests:  Recent Labs Lab 04/17/15 0234 04/18/15 0229 04/19/15 0234 04/19/15 1828  ALKPHOS  --   --   --  59  ALBUMIN 2.4* 2.5*  2.3*  --    No results for input(s): LIPASE, AMYLASE in the last 168 hours. No results for input(s): AMMONIA in the last 168 hours. CBC:  Recent Labs Lab 04/19/15 1428 04/20/15 0441 04/21/15 0443 04/22/15 0314 04/23/15 0432  WBC 8.6 8.7 8.7 9.5 10.2  HGB 9.3* 9.3* 8.5* 7.3* 9.0*  HCT 27.8* 27.9* 24.7* 21.8* 26.7*  MCV 94.6 95.2 94.6 94.0 90.5  PLT 188 214 223 260 289   Cardiac Enzymes: No results for input(s): CKTOTAL, CKMB, CKMBINDEX, TROPONINI in the last 168 hours. BNP: BNP (last 3 results) No results for  input(s): BNP in the last 8760 hours.  ProBNP (last 3 results) No results for input(s): PROBNP in the last 8760 hours.  CBG: No results for input(s): GLUCAP in the last 168 hours.     Signed:  Mattson Dayal, Orpah Melter  [facility] 04/23/2015, 7:28 PM

## 2015-04-23 NOTE — Op Note (Signed)
[facility] [place], [id number]   ENDOSCOPY PROCEDURE REPORT  PATIENT: [name], Frank  MR#: [id number] BIRTHDATE: Jun 28, 1941 , 73  yrs. old GENDER: male ENDOSCOPIST: Carol Ada, MD REFERRED BY: PROCEDURE DATE:  04/23/2015 PROCEDURE:  EGD, diagnostic ASA CLASS:     Class III INDICATIONS:  Anemia MEDICATIONS: Versed 2 mg IV and Fentanyl 25 mcg IV TOPICAL ANESTHETIC: none  DESCRIPTION OF PROCEDURE: After the risks benefits and alternatives of the procedure were thoroughly explained, informed consent was obtained.  The Pentax Gastroscope [id number] endoscope was introduced through the mouth and advanced to the second portion of the duodenum , Without limitations.  The instrument was slowly withdrawn as the mucosa was fully examined.   FINDINGS: The esophagus was normal and the Z-line was sharp.  The patient has a very large hiatal hernia.  It is estimated that 25-50% of his gastric lumen is above the diaphragm.  There is no evidence of any Cameron's lesions or vascular abnormalities.  In fact, his gastric mucosa appears much healthier than his prior evaluation 5 years ago.  The duodenum was normal.  No evidence of any obstruction wit the hiatal hernia.  Retroflexed views revealed no abnormalities.     The scope was then withdrawn from the patient and the procedure completed.  COMPLICATIONS: [Referring Physician]  ENDOSCOPIC IMPRESSION: 1) Large hiatal hernia  RECOMMENDATIONS: 1) Okay with anticoagulation. 2) Anemia persists or worsens a capsule endoscopy can be pursued +/- repeat colonoscopy.  REPEAT EXAM:  eSigned:  Carol Ada, MD 04/23/2015 7:52 AM    CC:

## 2015-04-23 NOTE — Discharge Instructions (Signed)
Atrial Fibrillation °Atrial fibrillation is a type of irregular heart rhythm (arrhythmia). During atrial fibrillation, the upper chambers of the heart (atria) quiver continuously in a chaotic pattern. This causes an irregular and often rapid heart rate.  °Atrial fibrillation is the result of the heart becoming overloaded with disorganized signals that tell it to beat. These signals are normally released one at a time by a part of the right atrium called the sinoatrial node. They then travel from the atria to the lower chambers of the heart (ventricles), causing the atria and ventricles to contract and pump blood as they pass. In atrial fibrillation, parts of the atria outside of the sinoatrial node also release these signals. This results in two problems. First, the atria receive so many signals that they do not have time to fully contract. Second, the ventricles, which can only receive one signal at a time, beat irregularly and out of rhythm with the atria.  °There are three types of atrial fibrillation:  °· Paroxysmal. Paroxysmal atrial fibrillation starts suddenly and stops on its own within a week. °· Persistent. Persistent atrial fibrillation lasts for more than a week. It may stop on its own or with treatment. °· Permanent. Permanent atrial fibrillation does not go away. Episodes of atrial fibrillation may lead to permanent atrial fibrillation. °Atrial fibrillation can prevent your heart from pumping blood normally. It increases your risk of stroke and can lead to heart failure.  °CAUSES  °· Heart conditions, including a heart attack, heart failure, coronary artery disease, and heart valve conditions.   °· Inflammation of the sac that surrounds the heart (pericarditis). °· Blockage of an artery in the lungs (pulmonary embolism). °· Pneumonia or other infections. °· Chronic lung disease. °· Thyroid problems, especially if the thyroid is overactive (hyperthyroidism). °· Caffeine, excessive alcohol use, and use  of some illegal drugs.   °· Use of some medicines, including certain decongestants and diet pills. °· Heart surgery.   °· Birth defects.   °Sometimes, no cause can be found. When this happens, the atrial fibrillation is called lone atrial fibrillation. The risk of complications from atrial fibrillation increases if you have lone atrial fibrillation and you are age 60 years or older. °RISK FACTORS °· Heart failure. °· Coronary artery disease. °· Diabetes mellitus.   °· High blood pressure (hypertension).   °· Obesity.   °· Other arrhythmias.   °· Increased age. °SIGNS AND SYMPTOMS  °· A feeling that your heart is beating rapidly or irregularly.   °· A feeling of discomfort or pain in your chest.   °· Shortness of breath.   °· Sudden light-headedness or weakness.   °· Getting tired easily when exercising.   °· Urinating more often than normal (mainly when atrial fibrillation first begins).   °In paroxysmal atrial fibrillation, symptoms may start and suddenly stop. °DIAGNOSIS  °Your health care provider may be able to detect atrial fibrillation when taking your pulse. Your health care provider may have you take a test called an ambulatory electrocardiogram (ECG). An ECG records your heartbeat patterns over a 24-hour period. You may also have other tests, such as: °· Transthoracic echocardiogram (TTE). During echocardiography, sound waves are used to evaluate how blood flows through your heart. °· Transesophageal echocardiogram (TEE). °· Stress test. There is more than one type of stress test. If a stress test is needed, ask your health care provider about which type is best for you. °· Chest X-ray exam. °· Blood tests. °· Computed tomography (CT). °TREATMENT  °Treatment may include: °· Treating any underlying conditions. For example, if you   have an overactive thyroid, treating the condition may correct atrial fibrillation.  Taking medicine. Medicines may be given to control a rapid heart rate or to prevent blood  clots, heart failure, or a stroke.  Having a procedure to correct the rhythm of the heart:  Electrical cardioversion. During electrical cardioversion, a controlled, low-energy shock is delivered to the heart through your skin. If you have chest pain, very low blood pressure, or sudden heart failure, this procedure may need to be done as an emergency.  Catheter ablation. During this procedure, heart tissues that send the signals that cause atrial fibrillation are destroyed.  Surgical ablation. During this surgery, thin lines of heart tissue that carry the abnormal signals are destroyed. This procedure can either be an open-heart surgery or a minimally invasive surgery. With the minimally invasive surgery, small cuts are made to access the heart instead of a large opening.  Pulmonary venous isolation. During this surgery, tissue around the veins that carry blood from the lungs (pulmonary veins) is destroyed. This tissue is thought to carry the abnormal signals. HOME CARE INSTRUCTIONS   Take medicines only as directed by your health care provider. Some medicines can make atrial fibrillation worse or recur.  If blood thinners were prescribed by your health care provider, take them exactly as directed. Too much blood-thinning medicine can cause bleeding. If you take too little, you will not have the needed protection against stroke and other problems.  Perform blood tests at home if directed by your health care provider. Perform blood tests exactly as directed.  Quit smoking if you smoke.  Do not drink alcohol.  Do not drink caffeinated beverages such as coffee, soda, and some teas. You may drink decaffeinated coffee, soda, or tea.   Maintain a healthy weight.Do not use diet pills unless your health care provider approves. They may make heart problems worse.   Follow diet instructions as directed by your health care provider.  Exercise regularly as directed by your health care  provider.  Keep all follow-up visits as directed by your health care provider. This is important. PREVENTION  The following substances can cause atrial fibrillation to recur:   Caffeinated beverages.  Alcohol.  Certain medicines, especially those used for breathing problems.  Certain herbs and herbal medicines, such as those containing ephedra or ginseng.  Illegal drugs, such as cocaine and amphetamines. Sometimes medicines are given to prevent atrial fibrillation from recurring. Proper treatment of any underlying condition is also important in helping prevent recurrence.  SEEK MEDICAL CARE IF:  You notice a change in the rate, rhythm, or strength of your heartbeat.  You suddenly begin urinating more frequently.  You tire more easily when exerting yourself or exercising. SEEK IMMEDIATE MEDICAL CARE IF:   You have chest pain, abdominal pain, sweating, or weakness.  You feel nauseous.  You have shortness of breath.  You suddenly have swollen feet and ankles.  You feel dizzy.  Your face or limbs feel numb or weak.  You have a change in your vision or speech. MAKE SURE YOU:   Understand these instructions.  Will watch your condition.  Will get help right away if you are not doing well or get worse. Document Released: 09/06/2005 Document Revised: 01/21/2014 Document Reviewed: 10/17/2012 [facility] Patient Information 2015 [place], [place]. This information is not intended to replace advice given to you by your health care provider. Make sure you discuss any questions you have with your health care provider. ------------------------------------------------------------------------------------------------------------------------------------------------  Information on my medicine -  Coumadin   (Warfarin)  This medication education was reviewed with me or my healthcare representative as part of my discharge preparation.  The pharmacist that spoke with me during my hospital stay  was:  Arty Baumgartner, [facility]  Why was Coumadin prescribed for you? Coumadin was prescribed for you because you have a blood clot or a medical condition that can cause an increased risk of forming blood clots. Blood clots can cause serious health problems by blocking the flow of blood to the heart, lung, or brain. Coumadin can prevent harmful blood clots from forming. As a reminder your indication for Coumadin is:   Stroke Prevention Because Of Atrial Fibrillation  What test will check on my response to Coumadin? While on Coumadin (warfarin) you will need to have an INR test regularly to ensure that your dose is keeping you in the desired range. The INR (international normalized ratio) number is calculated from the result of the laboratory test called prothrombin time (PT).  If an INR APPOINTMENT HAS NOT ALREADY BEEN MADE FOR YOU please schedule an appointment to have this lab work done by your health care provider within 7 days. Your INR goal is usually a number between:  2 to 3 or your provider may give you a more narrow range like 2-2.5.  Ask your health care provider during an office visit what your goal INR is.  What  do you need to  know  About  COUMADIN? Take Coumadin (warfarin) exactly as prescribed by your healthcare provider about the same time each day.  DO NOT stop taking without talking to the doctor who prescribed the medication.  Stopping without other blood clot prevention medication to take the place of Coumadin may increase your risk of developing a new clot or stroke.  Get refills before you run out.  What do you do if you miss a dose? If you miss a dose, take it as soon as you remember on the same day then continue your regularly scheduled regimen the next day.  Do not take two doses of Coumadin at the same time.  Important Safety Information A possible side effect of Coumadin (Warfarin) is an increased risk of bleeding. You should call your healthcare provider right away if  you experience any of the following: ? Bleeding from an injury or your nose that does not stop. ? Unusual colored urine (red or dark brown) or unusual colored stools (red or black). ? Unusual bruising for unknown reasons. ? A serious fall or if you hit your head (even if there is no bleeding).  Some foods or medicines interact with Coumadin (warfarin) and might alter your response to warfarin. To help avoid this: ? Eat a balanced diet, maintaining a consistent amount of Vitamin K. ? Notify your provider about major diet changes you plan to make. ? Avoid alcohol or limit your intake to 1 drink for women and 2 drinks for men per day. (1 drink is 5 oz. wine, 12 oz. beer, or 1.5 oz. liquor.)  Make sure that ANY health care provider who prescribes medication for you knows that you are taking Coumadin (warfarin).  Also make sure the healthcare provider who is monitoring your Coumadin knows when you have started a new medication including herbals and non-prescription products.  Coumadin (Warfarin)  Major Drug Interactions  Increased Warfarin Effect Decreased Warfarin Effect  Alcohol (large quantities) Antibiotics (esp. Septra/Bactrim, Flagyl, Cipro) Amiodarone (Cordarone) Aspirin (ASA) Cimetidine (Tagamet) Megestrol (Megace) NSAIDs (ibuprofen, naproxen, etc.) Piroxicam (Feldene)  Propafenone (Rythmol SR) Propranolol (Inderal) Isoniazid (INH) Posaconazole (Noxafil) Barbiturates (Phenobarbital) Carbamazepine (Tegretol) Chlordiazepoxide (Librium) Cholestyramine (Questran) Griseofulvin Oral Contraceptives Rifampin Sucralfate (Carafate) Vitamin K   Coumadin (Warfarin) Major Herbal Interactions  Increased Warfarin Effect Decreased Warfarin Effect  Garlic Ginseng Ginkgo biloba Coenzyme Q10 Green tea St. Johns wort    Coumadin (Warfarin) FOOD Interactions  Eat a consistent number of servings per week of foods HIGH in Vitamin K (1 serving =  cup)  Collards (cooked, or boiled &  drained) Kale (cooked, or boiled & drained) Mustard greens (cooked, or boiled & drained) Parsley *serving size only =  cup Spinach (cooked, or boiled & drained) Swiss chard (cooked, or boiled & drained) Turnip greens (cooked, or boiled & drained)  Eat a consistent number of servings per week of foods MEDIUM-HIGH in Vitamin K (1 serving = 1 cup)  Asparagus (cooked, or boiled & drained) Broccoli (cooked, boiled & drained, or raw & chopped) Brussel sprouts (cooked, or boiled & drained) *serving size only =  cup Lettuce, raw (green leaf, endive, romaine) Spinach, raw Turnip greens, raw & chopped   These websites have more information on Coumadin (warfarin):  FailFactory.se; VeganReport.com.au;

## 2015-04-23 NOTE — Progress Notes (Signed)
Subjective:  Patient denies any chest pain or shortness of breath.  Remains in sinus rhythm.  Patient had upper endoscopy today which showed a large hiatus hernia.  No obvious source of significant hemoglobin drop, recognizes so for.  Patient cardiac-wise is stable.  Objective:  Vital Signs in the last 24 hours: Temp:  [97.9 F (36.6 C)-98.9 F (37.2 C)] 98 F (36.7 C) (08/03 0754) Pulse Rate:  [67-82] 67 (08/03 0810) Resp:  [13-22] 14 (08/03 0810) BP: (119-181)/(52-94) 132/68 mmHg (08/03 0810) SpO2:  [93 %-100 %] 96 % (08/03 0810) Weight:  [88.451 kg (195 lb)] 88.451 kg (195 lb) (08/03 0413)  Intake/Output from previous day: 08/02 0701 - 08/03 0700 In: 2536 [P.O.:1854; I.V.:100; Blood:582] Out: 3600 [Urine:3600] Intake/Output from this shift:    Physical Exam: Neck: no adenopathy, no carotid bruit, no JVD and supple, symmetrical, trachea midline Lungs: clear to auscultation bilaterally Heart: regular rate and rhythm, S1, S2 normal and soft systolic murmur noted Abdomen: soft, non-tender; bowel sounds normal; no masses,  no organomegaly Extremities: extremities normal, atraumatic, no cyanosis or edema  Lab Results:  Recent Labs  04/22/15 0314 04/23/15 0432  WBC 9.5 10.2  HGB 7.3* 9.0*  PLT 260 289    Recent Labs  04/22/15 0314 04/23/15 0432  NA 139 141  K 3.4* 3.6  CL 103 104  CO2 24 26  GLUCOSE 120* 89  BUN 41* 42*  CREATININE 2.50* 2.58*   No results for input(s): TROPONINI in the last 72 hours.  Invalid input(s): CK, MB Hepatic Function Panel No results for input(s): PROT, ALBUMIN, AST, ALT, ALKPHOS, BILITOT, BILIDIR, IBILI in the last 72 hours. No results for input(s): CHOL in the last 72 hours. No results for input(s): PROTIME in the last 72 hours.  Imaging: Imaging results have been reviewed and No results found.  Cardiac Studies:  Assessment/Plan:  Recurrent A. fib /atrial flutter with RVR (ChadsVasc score of 3) Probable very small type II MI  secondary to demand ischemia/abnormal EKG rule out coronary insufficiency Hypertension New-onset diabetes, II Hypercholesteremia Status postCommunity acquired left pneumonia UTI Probable acute on chronic kidney injury secondary to obstructive uropathy Dehydration Anemia of chronic disease Status post rectal bleeding Status post hematuria Plan Continue present management. I'll sign off.  Please call me if needed. Follow-up with me in one week as an outpatient  LOS: 9 days    Charolette Forward 04/23/2015, 9:21 AM

## 2015-04-23 NOTE — Progress Notes (Signed)
Physical Therapy Treatment Patient Details Name: [name] MRN: [id number] DOB: 1940-09-24 Today's Date: 04/23/2015    History of Present Illness 74 year old Caucasian male with a past medical history of hypertension who denies taking any medications at home, presented with complaints of weakness and fatigue. He was found to be in atrial fibrillation with RVR. He had a temperature of 102F, likely secondary to pneumonia    PT Comments    Patient remains unsteady when ambulating without a device (staggering Lt and Rt). Agrees to use RW and agrees to HHPT. Encouraged to ambulate with nursing. Pt reports access to 4 wheel walker, however has never used one. May attempt use, however with pivoting/swivel wheels unsure this will be safe for pt. Currently continue to recommend fixed 2 wheel RW.   Follow Up Recommendations  Home health PT;Supervision for mobility/OOB     Equipment Recommendations  Rolling walker with 5" wheels    Recommendations for Other Services       Precautions / Restrictions Precautions Precautions: Fall Precaution Comments: watch HR    Mobility  Bed Mobility                  Transfers Overall transfer level: Needs assistance Equipment used: Rolling walker (2 wheeled);None Transfers: Sit to/from Stand Sit to Stand: Supervision         General transfer comment: cue for hand placement; steady with and without RWj  Ambulation/Gait Ambulation/Gait assistance: Min assist;Supervision Ambulation Distance (Feet): 120 Feet (no device; 300 wiht RW) Assistive device: Rolling walker (2 wheeled);None   Gait velocity: decr   General Gait Details: without device pt with stagger steps and drifting Lt and Rt; with RW, pt with smooth gait and maintained straight path even with multiple head turns Lt and Rt   Stairs Stairs:  (pt refused x3 offers "it's not a problem")          Wheelchair Mobility    Modified Rankin (Stroke Patients Only)        Balance Overall balance assessment: Needs assistance         Standing balance support: During functional activity;No upper extremity supported Standing balance-Leahy Scale: Fair                      Cognition Arousal/Alertness: Awake/alert Behavior During Therapy: WFL for tasks assessed/performed Overall Cognitive Status: Within Functional Limits for tasks assessed (very literal; denied "going down" for EGD, "i went over ther)                      Exercises      General Comments        Pertinent Vitals/Pain Pain Assessment: No/denies pain    Home Living               Home Equipment: Hand held shower head;Shower seat - built in;Walker - 4 wheels (reports rollator was his dad's)      Prior Function            PT Goals (current goals can now be found in the care plan section) Acute Rehab PT Goals Patient Stated Goal: walk without walker Time For Goal Achievement: 05/04/15 Progress towards PT goals: Progressing toward goals    Frequency  Min 3X/week    PT Plan Current plan remains appropriate    Co-evaluation             End of Session Equipment Utilized During Treatment: Gait belt Activity Tolerance: Patient tolerated  treatment well Patient left: in chair;with call bell/phone within reach     Time: 0912-0935 PT Time Calculation (min) (ACUTE ONLY): 23 min  Charges:  $Gait Training: 23-37 mins                    G Codes:      [name] May 19, 2015, 9:49 AM Pager [phone]

## 2015-04-23 NOTE — Progress Notes (Signed)
Occupational Therapy Treatment Patient Details Name: [name] MRN: [id number] DOB: 06-25-1941 Today's Date: 04/23/2015    History of present illness 74 year old Caucasian male with a past medical history of hypertension who denies taking any medications at home, presented with complaints of weakness and fatigue. He was found to be in atrial fibrillation with RVR. He had a temperature of 102F, likely secondary to pneumonia   OT comments  Pt progressing. Pt performed some ADLs and walked in hallway.   Follow Up Recommendations  Home health OT;Supervision/Assistance - 24 hour    Equipment Recommendations  None recommended by OT    Recommendations for Other Services      Precautions / Restrictions Precautions Precautions: Fall Restrictions Weight Bearing Restrictions: No       Mobility Bed Mobility               General bed mobility comments: not assessed  Transfers Overall transfer level: Needs assistance Transfers: Sit to/from Stand Sit to Stand: Supervision           Balance Used RW for ambulation-supervision/Min guard level.                ADL Overall ADL's : Needs assistance/impaired     Grooming: Oral care;Brushing hair;Set up;Supervision/safety;Standing (dryed hands) Grooming Details (indicate cue type and reason): Drank water in session after oral care      Lower Body Bathing: Set up;Supervison/ safety Lower Body Bathing Details (indicate cue type and reason): standing-washed bottom and peri area as well as front/top of legs         Toilet Transfer: Supervision/safety;Min guard;Ambulation;RW (sit to stand from chair)           Functional mobility during ADLs: Supervision/safety;Min guard;Rolling walker (ambulated some in hallway) General ADL Comments: Pt seemed SOB in session-educated on energy conservation. Wife reports pt is like this at home. OT asked wife about what RW pt has at home, and I believe he has rollator.        Vision                     Perception     Praxis      Cognition  Awake/Alert Behavior During Therapy: WFL for tasks assessed/performed Overall Cognitive Status: Difficult to assess due to [facility] (cues to put soap on washcloth for washing legs)                       Extremity/Trunk Assessment               Exercises     Shoulder Instructions       General Comments      Pertinent Vitals/ Pain       Pain Assessment: No/denies pain; HR stable  Home Living                                        Prior Functioning/Environment              Frequency Min 2X/week     Progress Toward Goals  OT Goals(current goals can now be found in the care plan section)  Progress towards OT goals: Progressing toward goals  Acute Rehab OT Goals Patient Stated Goal: not stated OT Goal Formulation: With patient Time For Goal Achievement: 04/28/15 Potential to Achieve Goals: Good ADL Goals Pt Will Perform Lower Body Bathing:  with set-up;sit to/from stand Pt Will Perform Lower Body Dressing: with set-up;sit to/from stand Pt Will Transfer to Toilet: ambulating;with modified independence Pt Will Perform Toileting - Clothing Manipulation and hygiene: with modified independence;sit to/from stand Additional ADL Goal #1: Pt will independently verbalize 3/3 energy conservation techniques and utilize during session.  Plan Discharge plan remains appropriate    Co-evaluation                 End of Session Equipment Utilized During Treatment: Gait belt;Rolling walker   Activity Tolerance Patient tolerated treatment well   Patient Left in chair;with call bell/phone within reach;with family/visitor present   Nurse Communication Other (comment) (asked about fluid restriction/drank water;family asking about d/c)        Time: [id number] OT Time Calculation (min): 21 min  Charges: OT General Charges $OT Visit: 1 Procedure OT  Treatments $Self Care/Home Management : 8-22 mins  Benito Mccreedy OTR/L [id number] 04/23/2015, 11:28 AM

## 2015-04-24 ENCOUNTER — Encounter (HOSPITAL_COMMUNITY): Payer: Self-pay | Admitting: Gastroenterology

## 2015-04-26 LAB — CULTURE, BLOOD (ROUTINE X 2)
CULTURE: NO GROWTH
Culture: NO GROWTH

## 2015-04-27 DIAGNOSIS — R2689 Other abnormalities of gait and mobility: Secondary | ICD-10-CM | POA: Diagnosis not present

## 2015-04-27 DIAGNOSIS — N390 Urinary tract infection, site not specified: Secondary | ICD-10-CM | POA: Diagnosis not present

## 2015-04-27 DIAGNOSIS — Z7901 Long term (current) use of anticoagulants: Secondary | ICD-10-CM | POA: Diagnosis not present

## 2015-04-27 DIAGNOSIS — I129 Hypertensive chronic kidney disease with stage 1 through stage 4 chronic kidney disease, or unspecified chronic kidney disease: Secondary | ICD-10-CM | POA: Diagnosis not present

## 2015-04-27 DIAGNOSIS — J189 Pneumonia, unspecified organism: Secondary | ICD-10-CM | POA: Diagnosis not present

## 2015-04-27 DIAGNOSIS — R338 Other retention of urine: Secondary | ICD-10-CM | POA: Diagnosis not present

## 2015-04-27 DIAGNOSIS — N184 Chronic kidney disease, stage 4 (severe): Secondary | ICD-10-CM | POA: Diagnosis not present

## 2015-04-27 DIAGNOSIS — I4891 Unspecified atrial fibrillation: Secondary | ICD-10-CM | POA: Diagnosis not present

## 2015-04-27 DIAGNOSIS — N1330 Unspecified hydronephrosis: Secondary | ICD-10-CM | POA: Diagnosis not present

## 2015-04-27 DIAGNOSIS — M6281 Muscle weakness (generalized): Secondary | ICD-10-CM | POA: Diagnosis not present

## 2015-04-28 DIAGNOSIS — Z7901 Long term (current) use of anticoagulants: Secondary | ICD-10-CM | POA: Diagnosis not present

## 2015-04-29 DIAGNOSIS — N1330 Unspecified hydronephrosis: Secondary | ICD-10-CM | POA: Diagnosis not present

## 2015-04-29 DIAGNOSIS — R2689 Other abnormalities of gait and mobility: Secondary | ICD-10-CM | POA: Diagnosis not present

## 2015-04-29 DIAGNOSIS — I129 Hypertensive chronic kidney disease with stage 1 through stage 4 chronic kidney disease, or unspecified chronic kidney disease: Secondary | ICD-10-CM | POA: Diagnosis not present

## 2015-04-29 DIAGNOSIS — Z7901 Long term (current) use of anticoagulants: Secondary | ICD-10-CM | POA: Diagnosis not present

## 2015-04-29 DIAGNOSIS — I4891 Unspecified atrial fibrillation: Secondary | ICD-10-CM | POA: Diagnosis not present

## 2015-04-29 DIAGNOSIS — J189 Pneumonia, unspecified organism: Secondary | ICD-10-CM | POA: Diagnosis not present

## 2015-04-29 DIAGNOSIS — R338 Other retention of urine: Secondary | ICD-10-CM | POA: Diagnosis not present

## 2015-04-29 DIAGNOSIS — N390 Urinary tract infection, site not specified: Secondary | ICD-10-CM | POA: Diagnosis not present

## 2015-04-29 DIAGNOSIS — M6281 Muscle weakness (generalized): Secondary | ICD-10-CM | POA: Diagnosis not present

## 2015-04-29 DIAGNOSIS — N184 Chronic kidney disease, stage 4 (severe): Secondary | ICD-10-CM | POA: Diagnosis not present

## 2015-04-30 DIAGNOSIS — N400 Enlarged prostate without lower urinary tract symptoms: Secondary | ICD-10-CM | POA: Diagnosis not present

## 2015-04-30 DIAGNOSIS — R351 Nocturia: Secondary | ICD-10-CM | POA: Diagnosis not present

## 2015-04-30 DIAGNOSIS — N139 Obstructive and reflux uropathy, unspecified: Secondary | ICD-10-CM | POA: Diagnosis not present

## 2015-04-30 DIAGNOSIS — I480 Paroxysmal atrial fibrillation: Secondary | ICD-10-CM | POA: Diagnosis not present

## 2015-04-30 DIAGNOSIS — I10 Essential (primary) hypertension: Secondary | ICD-10-CM | POA: Diagnosis not present

## 2015-04-30 DIAGNOSIS — R339 Retention of urine, unspecified: Secondary | ICD-10-CM | POA: Diagnosis not present

## 2015-04-30 DIAGNOSIS — E785 Hyperlipidemia, unspecified: Secondary | ICD-10-CM | POA: Diagnosis not present

## 2015-04-30 DIAGNOSIS — Z7901 Long term (current) use of anticoagulants: Secondary | ICD-10-CM | POA: Diagnosis not present

## 2015-04-30 DIAGNOSIS — N189 Chronic kidney disease, unspecified: Secondary | ICD-10-CM | POA: Diagnosis not present

## 2015-05-02 DIAGNOSIS — J189 Pneumonia, unspecified organism: Secondary | ICD-10-CM | POA: Diagnosis not present

## 2015-05-02 DIAGNOSIS — N1330 Unspecified hydronephrosis: Secondary | ICD-10-CM | POA: Diagnosis not present

## 2015-05-02 DIAGNOSIS — N390 Urinary tract infection, site not specified: Secondary | ICD-10-CM | POA: Diagnosis not present

## 2015-05-02 DIAGNOSIS — Z7901 Long term (current) use of anticoagulants: Secondary | ICD-10-CM | POA: Diagnosis not present

## 2015-05-02 DIAGNOSIS — R2689 Other abnormalities of gait and mobility: Secondary | ICD-10-CM | POA: Diagnosis not present

## 2015-05-02 DIAGNOSIS — I4891 Unspecified atrial fibrillation: Secondary | ICD-10-CM | POA: Diagnosis not present

## 2015-05-02 DIAGNOSIS — I129 Hypertensive chronic kidney disease with stage 1 through stage 4 chronic kidney disease, or unspecified chronic kidney disease: Secondary | ICD-10-CM | POA: Diagnosis not present

## 2015-05-02 DIAGNOSIS — M6281 Muscle weakness (generalized): Secondary | ICD-10-CM | POA: Diagnosis not present

## 2015-05-02 DIAGNOSIS — N184 Chronic kidney disease, stage 4 (severe): Secondary | ICD-10-CM | POA: Diagnosis not present

## 2015-05-02 DIAGNOSIS — R338 Other retention of urine: Secondary | ICD-10-CM | POA: Diagnosis not present

## 2015-05-05 DIAGNOSIS — N390 Urinary tract infection, site not specified: Secondary | ICD-10-CM | POA: Diagnosis not present

## 2015-05-05 DIAGNOSIS — N1330 Unspecified hydronephrosis: Secondary | ICD-10-CM | POA: Diagnosis not present

## 2015-05-05 DIAGNOSIS — I129 Hypertensive chronic kidney disease with stage 1 through stage 4 chronic kidney disease, or unspecified chronic kidney disease: Secondary | ICD-10-CM | POA: Diagnosis not present

## 2015-05-05 DIAGNOSIS — M6281 Muscle weakness (generalized): Secondary | ICD-10-CM | POA: Diagnosis not present

## 2015-05-05 DIAGNOSIS — J189 Pneumonia, unspecified organism: Secondary | ICD-10-CM | POA: Diagnosis not present

## 2015-05-05 DIAGNOSIS — Z7901 Long term (current) use of anticoagulants: Secondary | ICD-10-CM | POA: Diagnosis not present

## 2015-05-05 DIAGNOSIS — I4891 Unspecified atrial fibrillation: Secondary | ICD-10-CM | POA: Diagnosis not present

## 2015-05-05 DIAGNOSIS — R2689 Other abnormalities of gait and mobility: Secondary | ICD-10-CM | POA: Diagnosis not present

## 2015-05-05 DIAGNOSIS — R338 Other retention of urine: Secondary | ICD-10-CM | POA: Diagnosis not present

## 2015-05-05 DIAGNOSIS — N184 Chronic kidney disease, stage 4 (severe): Secondary | ICD-10-CM | POA: Diagnosis not present

## 2015-05-07 DIAGNOSIS — Z7901 Long term (current) use of anticoagulants: Secondary | ICD-10-CM | POA: Diagnosis not present

## 2015-05-09 DIAGNOSIS — M6281 Muscle weakness (generalized): Secondary | ICD-10-CM | POA: Diagnosis not present

## 2015-05-09 DIAGNOSIS — J189 Pneumonia, unspecified organism: Secondary | ICD-10-CM | POA: Diagnosis not present

## 2015-05-09 DIAGNOSIS — N1330 Unspecified hydronephrosis: Secondary | ICD-10-CM | POA: Diagnosis not present

## 2015-05-09 DIAGNOSIS — N390 Urinary tract infection, site not specified: Secondary | ICD-10-CM | POA: Diagnosis not present

## 2015-05-09 DIAGNOSIS — I129 Hypertensive chronic kidney disease with stage 1 through stage 4 chronic kidney disease, or unspecified chronic kidney disease: Secondary | ICD-10-CM | POA: Diagnosis not present

## 2015-05-09 DIAGNOSIS — R2689 Other abnormalities of gait and mobility: Secondary | ICD-10-CM | POA: Diagnosis not present

## 2015-05-09 DIAGNOSIS — N184 Chronic kidney disease, stage 4 (severe): Secondary | ICD-10-CM | POA: Diagnosis not present

## 2015-05-09 DIAGNOSIS — R338 Other retention of urine: Secondary | ICD-10-CM | POA: Diagnosis not present

## 2015-05-09 DIAGNOSIS — Z7901 Long term (current) use of anticoagulants: Secondary | ICD-10-CM | POA: Diagnosis not present

## 2015-05-09 DIAGNOSIS — I4891 Unspecified atrial fibrillation: Secondary | ICD-10-CM | POA: Diagnosis not present

## 2015-05-12 DIAGNOSIS — I4891 Unspecified atrial fibrillation: Secondary | ICD-10-CM | POA: Diagnosis not present

## 2015-05-12 DIAGNOSIS — N184 Chronic kidney disease, stage 4 (severe): Secondary | ICD-10-CM | POA: Diagnosis not present

## 2015-05-12 DIAGNOSIS — M6281 Muscle weakness (generalized): Secondary | ICD-10-CM | POA: Diagnosis not present

## 2015-05-12 DIAGNOSIS — N1330 Unspecified hydronephrosis: Secondary | ICD-10-CM | POA: Diagnosis not present

## 2015-05-12 DIAGNOSIS — J189 Pneumonia, unspecified organism: Secondary | ICD-10-CM | POA: Diagnosis not present

## 2015-05-12 DIAGNOSIS — R338 Other retention of urine: Secondary | ICD-10-CM | POA: Diagnosis not present

## 2015-05-12 DIAGNOSIS — R2689 Other abnormalities of gait and mobility: Secondary | ICD-10-CM | POA: Diagnosis not present

## 2015-05-12 DIAGNOSIS — N390 Urinary tract infection, site not specified: Secondary | ICD-10-CM | POA: Diagnosis not present

## 2015-05-12 DIAGNOSIS — I129 Hypertensive chronic kidney disease with stage 1 through stage 4 chronic kidney disease, or unspecified chronic kidney disease: Secondary | ICD-10-CM | POA: Diagnosis not present

## 2015-05-12 DIAGNOSIS — Z7901 Long term (current) use of anticoagulants: Secondary | ICD-10-CM | POA: Diagnosis not present

## 2015-05-14 DIAGNOSIS — Z7901 Long term (current) use of anticoagulants: Secondary | ICD-10-CM | POA: Diagnosis not present

## 2015-05-14 DIAGNOSIS — R351 Nocturia: Secondary | ICD-10-CM | POA: Diagnosis not present

## 2015-05-14 DIAGNOSIS — R339 Retention of urine, unspecified: Secondary | ICD-10-CM | POA: Diagnosis not present

## 2015-05-14 DIAGNOSIS — N139 Obstructive and reflux uropathy, unspecified: Secondary | ICD-10-CM | POA: Diagnosis not present

## 2015-05-16 DIAGNOSIS — N1330 Unspecified hydronephrosis: Secondary | ICD-10-CM | POA: Diagnosis not present

## 2015-05-16 DIAGNOSIS — R338 Other retention of urine: Secondary | ICD-10-CM | POA: Diagnosis not present

## 2015-05-16 DIAGNOSIS — J189 Pneumonia, unspecified organism: Secondary | ICD-10-CM | POA: Diagnosis not present

## 2015-05-16 DIAGNOSIS — R2689 Other abnormalities of gait and mobility: Secondary | ICD-10-CM | POA: Diagnosis not present

## 2015-05-16 DIAGNOSIS — I129 Hypertensive chronic kidney disease with stage 1 through stage 4 chronic kidney disease, or unspecified chronic kidney disease: Secondary | ICD-10-CM | POA: Diagnosis not present

## 2015-05-16 DIAGNOSIS — I4891 Unspecified atrial fibrillation: Secondary | ICD-10-CM | POA: Diagnosis not present

## 2015-05-16 DIAGNOSIS — N184 Chronic kidney disease, stage 4 (severe): Secondary | ICD-10-CM | POA: Diagnosis not present

## 2015-05-16 DIAGNOSIS — Z7901 Long term (current) use of anticoagulants: Secondary | ICD-10-CM | POA: Diagnosis not present

## 2015-05-16 DIAGNOSIS — M6281 Muscle weakness (generalized): Secondary | ICD-10-CM | POA: Diagnosis not present

## 2015-05-16 DIAGNOSIS — N390 Urinary tract infection, site not specified: Secondary | ICD-10-CM | POA: Diagnosis not present

## 2015-05-20 DIAGNOSIS — R339 Retention of urine, unspecified: Secondary | ICD-10-CM | POA: Diagnosis not present

## 2015-05-21 DIAGNOSIS — N390 Urinary tract infection, site not specified: Secondary | ICD-10-CM | POA: Diagnosis not present

## 2015-05-21 DIAGNOSIS — N1330 Unspecified hydronephrosis: Secondary | ICD-10-CM | POA: Diagnosis not present

## 2015-05-21 DIAGNOSIS — N184 Chronic kidney disease, stage 4 (severe): Secondary | ICD-10-CM | POA: Diagnosis not present

## 2015-05-21 DIAGNOSIS — Z7901 Long term (current) use of anticoagulants: Secondary | ICD-10-CM | POA: Diagnosis not present

## 2015-05-21 DIAGNOSIS — I4891 Unspecified atrial fibrillation: Secondary | ICD-10-CM | POA: Diagnosis not present

## 2015-05-21 DIAGNOSIS — J189 Pneumonia, unspecified organism: Secondary | ICD-10-CM | POA: Diagnosis not present

## 2015-05-21 DIAGNOSIS — R338 Other retention of urine: Secondary | ICD-10-CM | POA: Diagnosis not present

## 2015-05-21 DIAGNOSIS — M6281 Muscle weakness (generalized): Secondary | ICD-10-CM | POA: Diagnosis not present

## 2015-05-21 DIAGNOSIS — R2689 Other abnormalities of gait and mobility: Secondary | ICD-10-CM | POA: Diagnosis not present

## 2015-05-21 DIAGNOSIS — I129 Hypertensive chronic kidney disease with stage 1 through stage 4 chronic kidney disease, or unspecified chronic kidney disease: Secondary | ICD-10-CM | POA: Diagnosis not present

## 2015-05-23 DIAGNOSIS — R338 Other retention of urine: Secondary | ICD-10-CM | POA: Diagnosis not present

## 2015-05-23 DIAGNOSIS — N390 Urinary tract infection, site not specified: Secondary | ICD-10-CM | POA: Diagnosis not present

## 2015-05-23 DIAGNOSIS — N184 Chronic kidney disease, stage 4 (severe): Secondary | ICD-10-CM | POA: Diagnosis not present

## 2015-05-23 DIAGNOSIS — I129 Hypertensive chronic kidney disease with stage 1 through stage 4 chronic kidney disease, or unspecified chronic kidney disease: Secondary | ICD-10-CM | POA: Diagnosis not present

## 2015-05-23 DIAGNOSIS — N1330 Unspecified hydronephrosis: Secondary | ICD-10-CM | POA: Diagnosis not present

## 2015-05-23 DIAGNOSIS — M6281 Muscle weakness (generalized): Secondary | ICD-10-CM | POA: Diagnosis not present

## 2015-05-23 DIAGNOSIS — R2689 Other abnormalities of gait and mobility: Secondary | ICD-10-CM | POA: Diagnosis not present

## 2015-05-23 DIAGNOSIS — I4891 Unspecified atrial fibrillation: Secondary | ICD-10-CM | POA: Diagnosis not present

## 2015-05-23 DIAGNOSIS — J189 Pneumonia, unspecified organism: Secondary | ICD-10-CM | POA: Diagnosis not present

## 2015-05-23 DIAGNOSIS — Z7901 Long term (current) use of anticoagulants: Secondary | ICD-10-CM | POA: Diagnosis not present

## 2015-05-28 DIAGNOSIS — Z7901 Long term (current) use of anticoagulants: Secondary | ICD-10-CM | POA: Diagnosis not present

## 2015-06-11 DIAGNOSIS — Z7901 Long term (current) use of anticoagulants: Secondary | ICD-10-CM | POA: Diagnosis not present

## 2015-06-13 DIAGNOSIS — I4891 Unspecified atrial fibrillation: Secondary | ICD-10-CM | POA: Diagnosis not present

## 2015-06-13 DIAGNOSIS — R972 Elevated prostate specific antigen [PSA]: Secondary | ICD-10-CM | POA: Diagnosis not present

## 2015-06-13 DIAGNOSIS — N179 Acute kidney failure, unspecified: Secondary | ICD-10-CM | POA: Diagnosis not present

## 2015-06-13 DIAGNOSIS — I10 Essential (primary) hypertension: Secondary | ICD-10-CM | POA: Diagnosis not present

## 2015-06-13 DIAGNOSIS — N139 Obstructive and reflux uropathy, unspecified: Secondary | ICD-10-CM | POA: Diagnosis not present

## 2015-06-13 DIAGNOSIS — R339 Retention of urine, unspecified: Secondary | ICD-10-CM | POA: Diagnosis not present

## 2015-06-18 DIAGNOSIS — I10 Essential (primary) hypertension: Secondary | ICD-10-CM | POA: Diagnosis not present

## 2015-06-18 DIAGNOSIS — I4891 Unspecified atrial fibrillation: Secondary | ICD-10-CM | POA: Diagnosis not present

## 2015-06-25 DIAGNOSIS — N3946 Mixed incontinence: Secondary | ICD-10-CM | POA: Diagnosis not present

## 2015-06-25 DIAGNOSIS — R278 Other lack of coordination: Secondary | ICD-10-CM | POA: Diagnosis not present

## 2015-06-25 DIAGNOSIS — R339 Retention of urine, unspecified: Secondary | ICD-10-CM | POA: Diagnosis not present

## 2015-06-25 DIAGNOSIS — M6281 Muscle weakness (generalized): Secondary | ICD-10-CM | POA: Diagnosis not present

## 2015-06-25 DIAGNOSIS — Z7901 Long term (current) use of anticoagulants: Secondary | ICD-10-CM | POA: Diagnosis not present

## 2015-06-30 DIAGNOSIS — N179 Acute kidney failure, unspecified: Secondary | ICD-10-CM | POA: Diagnosis not present

## 2015-06-30 DIAGNOSIS — I4891 Unspecified atrial fibrillation: Secondary | ICD-10-CM | POA: Diagnosis not present

## 2015-06-30 DIAGNOSIS — R739 Hyperglycemia, unspecified: Secondary | ICD-10-CM | POA: Diagnosis not present

## 2015-06-30 DIAGNOSIS — I10 Essential (primary) hypertension: Secondary | ICD-10-CM | POA: Diagnosis not present

## 2015-07-01 DIAGNOSIS — R339 Retention of urine, unspecified: Secondary | ICD-10-CM | POA: Diagnosis not present

## 2015-07-01 DIAGNOSIS — Z7901 Long term (current) use of anticoagulants: Secondary | ICD-10-CM | POA: Diagnosis not present

## 2015-07-01 DIAGNOSIS — R310 Gross hematuria: Secondary | ICD-10-CM | POA: Diagnosis not present

## 2015-07-02 DIAGNOSIS — R339 Retention of urine, unspecified: Secondary | ICD-10-CM | POA: Diagnosis not present

## 2015-07-04 DIAGNOSIS — R278 Other lack of coordination: Secondary | ICD-10-CM | POA: Diagnosis not present

## 2015-07-04 DIAGNOSIS — R488 Other symbolic dysfunctions: Secondary | ICD-10-CM | POA: Diagnosis not present

## 2015-07-04 DIAGNOSIS — R339 Retention of urine, unspecified: Secondary | ICD-10-CM | POA: Diagnosis not present

## 2015-07-04 DIAGNOSIS — N139 Obstructive and reflux uropathy, unspecified: Secondary | ICD-10-CM | POA: Diagnosis not present

## 2015-07-04 DIAGNOSIS — M62838 Other muscle spasm: Secondary | ICD-10-CM | POA: Diagnosis not present

## 2015-07-10 DIAGNOSIS — R278 Other lack of coordination: Secondary | ICD-10-CM | POA: Diagnosis not present

## 2015-07-10 DIAGNOSIS — M62838 Other muscle spasm: Secondary | ICD-10-CM | POA: Diagnosis not present

## 2015-07-10 DIAGNOSIS — M6281 Muscle weakness (generalized): Secondary | ICD-10-CM | POA: Diagnosis not present

## 2015-07-10 DIAGNOSIS — N139 Obstructive and reflux uropathy, unspecified: Secondary | ICD-10-CM | POA: Diagnosis not present

## 2015-07-15 ENCOUNTER — Other Ambulatory Visit: Payer: Self-pay | Admitting: Urology

## 2015-07-15 DIAGNOSIS — Z7901 Long term (current) use of anticoagulants: Secondary | ICD-10-CM | POA: Diagnosis not present

## 2015-07-16 ENCOUNTER — Other Ambulatory Visit (HOSPITAL_COMMUNITY): Payer: Self-pay | Admitting: *Deleted

## 2015-07-16 NOTE — Patient Instructions (Addendum)
[name]  07/16/2015   Your procedure is scheduled on:   Monday  07-21-15  Report to [facility] Main  Entrance take [facility]  elevators to 3rd floor to  [place] at 930 AM.  Call this number if you have problems the morning of surgery [phone]   Remember: ONLY 1 PERSON MAY GO WITH YOU TO SHORT STAY TO GET  READY MORNING OF [name]   Do not eat food or drink liquids :After Midnight.     Take these medicines the morning of surgery with A SIP OF WATER: AMIODARONE (PACERONE), DILTIAZEM (CARDIZEM), FINASTERIDE (PROSCAR)  DO NOT TAKE ANY DIABETIC MEDICATIONS DAY OF YOUR SURGERY                               You may not have any metal on your body including hair pins and              piercings  Do not wear jewelry, make-up, lotions, powders or perfumes, deodorant             Do not wear nail polish.  Do not shave  48 hours prior to surgery.              Men may shave face and neck.   Do not bring valuables to the hospital. [place].  Contacts, dentures or bridgework may not be worn into surgery.  Leave suitcase in the car. After surgery it may be brought to your room.     Patients discharged the day of surgery will not be allowed to drive home.  Name and phone number of your driver:  Special Instructions: N/A              Please read over the following fact sheets you were given: _____________________________________________________________________             [facility] - Preparing for Surgery Before surgery, you can play an important role.  Because skin is not sterile, your skin needs to be as free of germs as possible.  You can reduce the number of germs on your skin by washing with CHG (chlorahexidine gluconate) soap before surgery.  CHG is an antiseptic cleaner which kills germs and bonds with the skin to continue killing germs even after washing. Please DO NOT use if you have  an allergy to CHG or antibacterial soaps.  If your skin becomes reddened/irritated stop using the CHG and inform your nurse when you arrive at Short Stay. Do not shave (including legs and underarms) for at least 48 hours prior to the first CHG shower.  You may shave your face/neck. Please follow these instructions carefully:  1.  Shower with CHG Soap the night before surgery and the  morning of Surgery.  2.  If you choose to wash your hair, wash your hair first as usual with your  normal  shampoo.  3.  After you shampoo, rinse your hair and body thoroughly to remove the  shampoo.                           4.  Use CHG as you would any other liquid soap.  You  can apply chg directly  to the skin and wash                       Gently with a scrungie or clean washcloth.  5.  Apply the CHG Soap to your body ONLY FROM THE NECK DOWN.   Do not use on face/ open                           Wound or open sores. Avoid contact with eyes, ears mouth and genitals (private parts).                       Wash face,  Genitals (private parts) with your normal soap.             6.  Wash thoroughly, paying special attention to the area where your surgery  will be performed.  7.  Thoroughly rinse your body with warm water from the neck down.  8.  DO NOT shower/wash with your normal soap after using and rinsing off  the CHG Soap.                9.  Pat yourself dry with a clean towel.            10.  Wear clean pajamas.            11.  Place clean sheets on your bed the night of your first shower and do not  sleep with pets. Day of Surgery : Do not apply any lotions/deodorants the morning of surgery.  Please wear clean clothes to the hospital/surgery center.  FAILURE TO FOLLOW THESE INSTRUCTIONS MAY RESULT IN THE CANCELLATION OF YOUR SURGERY PATIENT SIGNATURE_________________________________  NURSE SIGNATURE__________________________________  ________________________________________________________________________

## 2015-07-16 NOTE — Progress Notes (Signed)
ECHO 04-15-15 EPIC EKG 04-23-15 EPIC CHEST XRAY 1 VIEW ABNORMAL 04-14-15 EPIC LOV DR HARWANI CARDIOLOGY 04-30-15 ON CHART

## 2015-07-17 ENCOUNTER — Encounter (HOSPITAL_COMMUNITY): Payer: Self-pay

## 2015-07-17 ENCOUNTER — Ambulatory Visit (HOSPITAL_COMMUNITY)
Admission: RE | Admit: 2015-07-17 | Discharge: 2015-07-17 | Disposition: A | Discharge status: Home / Self Care | Payer: Medicare Other | Source: Ambulatory Visit | Attending: Anesthesiology | Admitting: Anesthesiology

## 2015-07-17 ENCOUNTER — Encounter (HOSPITAL_COMMUNITY)
Admission: RE | Admit: 2015-07-17 | Discharge: 2015-07-17 | Disposition: A | Discharge status: Home / Self Care | Payer: Medicare Other | Source: Ambulatory Visit | Attending: Urology | Admitting: Urology

## 2015-07-17 DIAGNOSIS — K449 Diaphragmatic hernia without obstruction or gangrene: Secondary | ICD-10-CM | POA: Insufficient documentation

## 2015-07-17 DIAGNOSIS — R339 Retention of urine, unspecified: Secondary | ICD-10-CM | POA: Diagnosis not present

## 2015-07-17 DIAGNOSIS — Z01812 Encounter for preprocedural laboratory examination: Secondary | ICD-10-CM | POA: Insufficient documentation

## 2015-07-17 DIAGNOSIS — Z01818 Encounter for other preprocedural examination: Secondary | ICD-10-CM | POA: Insufficient documentation

## 2015-07-17 DIAGNOSIS — I10 Essential (primary) hypertension: Secondary | ICD-10-CM | POA: Diagnosis not present

## 2015-07-17 DIAGNOSIS — Z01811 Encounter for preprocedural respiratory examination: Secondary | ICD-10-CM

## 2015-07-17 HISTORY — DX: Cardiac arrhythmia, unspecified: I49.9

## 2015-07-17 HISTORY — DX: Headache: R51

## 2015-07-17 HISTORY — DX: Headache, unspecified: R51.9

## 2015-07-17 LAB — CBC
HEMATOCRIT: 34 % — AB (ref 39.0–52.0)
Hemoglobin: 11.3 g/dL — ABNORMAL LOW (ref 13.0–17.0)
MCH: 32.6 pg (ref 26.0–34.0)
MCHC: 33.2 g/dL (ref 30.0–36.0)
MCV: 98 fL (ref 78.0–100.0)
PLATELETS: 300 10*3/uL (ref 150–400)
RBC: 3.47 MIL/uL — ABNORMAL LOW (ref 4.22–5.81)
RDW: 13.3 % (ref 11.5–15.5)
WBC: 6.1 10*3/uL (ref 4.0–10.5)

## 2015-07-17 LAB — BASIC METABOLIC PANEL
Anion gap: 8 (ref 5–15)
BUN: 30 mg/dL — AB (ref 6–20)
CALCIUM: 9.5 mg/dL (ref 8.9–10.3)
CO2: 29 mmol/L (ref 22–32)
CREATININE: 1.93 mg/dL — AB (ref 0.61–1.24)
Chloride: 103 mmol/L (ref 101–111)
GFR calc Af Amer: 38 mL/min — ABNORMAL LOW (ref >60)
GFR, EST NON AFRICAN AMERICAN: 33 mL/min — AB (ref >60)
Glucose, Bld: 106 mg/dL — ABNORMAL HIGH (ref 65–99)
Potassium: 4 mmol/L (ref 3.5–5.1)
SODIUM: 140 mmol/L (ref 135–145)

## 2015-07-17 LAB — PROTIME-INR
INR: 1.02 (ref 0.00–1.49)
PROTHROMBIN TIME: 13.6 s (ref 11.6–15.2)

## 2015-07-17 LAB — APTT: APTT: 27 s (ref 24–37)

## 2015-07-17 NOTE — Progress Notes (Signed)
   07/17/15 1115  OBSTRUCTIVE SLEEP APNEA  Have you ever been diagnosed with sleep apnea through a sleep study? No  Do you snore loudly (loud enough to be heard through closed doors)?  1  Do you often feel tired, fatigued, or sleepy during the daytime (such as falling asleep during driving or talking to someone)? 1  Has anyone observed you stop breathing during your sleep? 0  Do you have, or are you being treated for high blood pressure? 1  BMI more than 35 kg/m2? 0  Age > 50 (1-yes) 1  Neck circumference greater than:Male 16 inches or larger, Male 17inches or larger? 1  Male Gender (Yes=1) 1  Obstructive Sleep Apnea Score 6  Score 5 or greater  Results sent to PCP

## 2015-07-21 ENCOUNTER — Encounter (HOSPITAL_COMMUNITY): Payer: Self-pay

## 2015-07-21 ENCOUNTER — Inpatient Hospital Stay (HOSPITAL_COMMUNITY): Payer: Medicare Other | Admitting: Anesthesiology

## 2015-07-21 ENCOUNTER — Encounter (HOSPITAL_COMMUNITY)
Admission: AD | Disposition: A | Discharge status: Home / Self Care | Payer: Self-pay | Source: Ambulatory Visit | Attending: Urology

## 2015-07-21 ENCOUNTER — Inpatient Hospital Stay (HOSPITAL_COMMUNITY)
Admission: AD | Admit: 2015-07-21 | Discharge: 2015-07-23 | DRG: 713 | Disposition: A | Discharge status: Home / Self Care | Payer: Medicare Other | Source: Ambulatory Visit | Attending: Urology | Admitting: Urology

## 2015-07-21 DIAGNOSIS — N323 Diverticulum of bladder: Secondary | ICD-10-CM | POA: Diagnosis not present

## 2015-07-21 DIAGNOSIS — R351 Nocturia: Secondary | ICD-10-CM | POA: Diagnosis not present

## 2015-07-21 DIAGNOSIS — R338 Other retention of urine: Secondary | ICD-10-CM | POA: Diagnosis not present

## 2015-07-21 DIAGNOSIS — Z8042 Family history of malignant neoplasm of prostate: Secondary | ICD-10-CM

## 2015-07-21 DIAGNOSIS — E7800 Pure hypercholesterolemia, unspecified: Secondary | ICD-10-CM | POA: Diagnosis present

## 2015-07-21 DIAGNOSIS — Z79899 Other long term (current) drug therapy: Secondary | ICD-10-CM | POA: Diagnosis not present

## 2015-07-21 DIAGNOSIS — N138 Other obstructive and reflux uropathy: Secondary | ICD-10-CM | POA: Diagnosis present

## 2015-07-21 DIAGNOSIS — N3289 Other specified disorders of bladder: Secondary | ICD-10-CM | POA: Diagnosis not present

## 2015-07-21 DIAGNOSIS — N184 Chronic kidney disease, stage 4 (severe): Secondary | ICD-10-CM | POA: Diagnosis present

## 2015-07-21 DIAGNOSIS — N400 Enlarged prostate without lower urinary tract symptoms: Secondary | ICD-10-CM | POA: Diagnosis not present

## 2015-07-21 DIAGNOSIS — I4891 Unspecified atrial fibrillation: Secondary | ICD-10-CM | POA: Diagnosis present

## 2015-07-21 DIAGNOSIS — N401 Enlarged prostate with lower urinary tract symptoms: Secondary | ICD-10-CM | POA: Diagnosis not present

## 2015-07-21 DIAGNOSIS — I129 Hypertensive chronic kidney disease with stage 1 through stage 4 chronic kidney disease, or unspecified chronic kidney disease: Secondary | ICD-10-CM | POA: Diagnosis present

## 2015-07-21 DIAGNOSIS — Z87440 Personal history of urinary (tract) infections: Secondary | ICD-10-CM

## 2015-07-21 DIAGNOSIS — E291 Testicular hypofunction: Secondary | ICD-10-CM | POA: Diagnosis not present

## 2015-07-21 DIAGNOSIS — R339 Retention of urine, unspecified: Secondary | ICD-10-CM | POA: Diagnosis not present

## 2015-07-21 HISTORY — PX: TRANSURETHRAL RESECTION OF PROSTATE: SHX73

## 2015-07-21 HISTORY — DX: Nausea with vomiting, unspecified: Z98.890

## 2015-07-21 HISTORY — DX: Other obstructive and reflux uropathy: N13.8

## 2015-07-21 HISTORY — DX: Adverse effect of unspecified anesthetic, initial encounter: T41.45XA

## 2015-07-21 HISTORY — DX: Other complications of anesthesia, initial encounter: T88.59XA

## 2015-07-21 HISTORY — DX: Other specified postprocedural states: R11.2

## 2015-07-21 SURGERY — TURP (TRANSURETHRAL RESECTION OF PROSTATE)
Anesthesia: General | Site: Prostate

## 2015-07-21 MED ORDER — LIDOCAINE HCL 2 % EX GEL
CUTANEOUS | Status: AC
  Filled 2015-07-21: qty 5

## 2015-07-21 MED ORDER — SODIUM CHLORIDE 0.9 % IR SOLN
3000.0000 mL | Status: DC
Start: 2015-07-21 — End: 2015-07-23
  Administered 2015-07-21 – 2015-07-22 (×7): 3000 mL

## 2015-07-21 MED ORDER — BELLADONNA ALKALOIDS-OPIUM 16.2-60 MG RE SUPP
1.0000 | Freq: Four times a day (QID) | RECTAL | Status: DC | PRN
  Administered 2015-07-22 (×2): 1 via RECTAL
  Filled 2015-07-21 (×3): qty 1

## 2015-07-21 MED ORDER — BELLADONNA ALKALOIDS-OPIUM 16.2-60 MG RE SUPP
RECTAL | Status: AC
  Filled 2015-07-21: qty 1

## 2015-07-21 MED ORDER — AMIODARONE HCL 200 MG PO TABS
200.0000 mg | ORAL_TABLET | Freq: Every day | ORAL | Status: DC
  Administered 2015-07-22 – 2015-07-23 (×2): 200 mg via ORAL
  Filled 2015-07-21 (×2): qty 1

## 2015-07-21 MED ORDER — PANTOPRAZOLE SODIUM 40 MG PO TBEC
40.0000 mg | DELAYED_RELEASE_TABLET | Freq: Every day | ORAL | Status: DC
  Administered 2015-07-22 – 2015-07-23 (×2): 40 mg via ORAL
  Filled 2015-07-21 (×2): qty 1

## 2015-07-21 MED ORDER — LACTATED RINGERS IV SOLN
INTRAVENOUS | Status: DC | PRN
  Administered 2015-07-21 (×2): via INTRAVENOUS

## 2015-07-21 MED ORDER — ONDANSETRON HCL 4 MG/2ML IJ SOLN
INTRAMUSCULAR | Status: AC
  Filled 2015-07-21: qty 2

## 2015-07-21 MED ORDER — BACITRACIN-NEOMYCIN-POLYMYXIN 400-5-5000 EX OINT: 1.0000 "application " | TOPICAL_OINTMENT | Freq: Three times a day (TID) | CUTANEOUS | Status: DC | PRN

## 2015-07-21 MED ORDER — OXYCODONE-ACETAMINOPHEN 5-325 MG PO TABS
1.0000 | ORAL_TABLET | ORAL | Status: DC | PRN
  Administered 2015-07-22: 1 via ORAL
  Filled 2015-07-21: qty 2

## 2015-07-21 MED ORDER — PROPOFOL 10 MG/ML IV BOLUS
INTRAVENOUS | Status: AC
  Filled 2015-07-21: qty 20

## 2015-07-21 MED ORDER — ACETAMINOPHEN 325 MG PO TABS
650.0000 mg | ORAL_TABLET | ORAL | Status: DC | PRN
  Administered 2015-07-21 – 2015-07-22 (×4): 650 mg via ORAL
  Filled 2015-07-21 (×4): qty 2

## 2015-07-21 MED ORDER — LIDOCAINE HCL (CARDIAC) 20 MG/ML IV SOLN
INTRAVENOUS | Status: DC | PRN
  Administered 2015-07-21: 40 mg via INTRAVENOUS

## 2015-07-21 MED ORDER — EPHEDRINE SULFATE 50 MG/ML IJ SOLN
INTRAMUSCULAR | Status: AC
  Filled 2015-07-21: qty 1

## 2015-07-21 MED ORDER — HYDROMORPHONE HCL 1 MG/ML IJ SOLN: 0.2500 mg | INTRAMUSCULAR | Status: DC | PRN

## 2015-07-21 MED ORDER — FENTANYL CITRATE (PF) 100 MCG/2ML IJ SOLN
INTRAMUSCULAR | Status: AC
  Filled 2015-07-21: qty 4

## 2015-07-21 MED ORDER — SODIUM CHLORIDE 0.45 % IV SOLN
INTRAVENOUS | Status: DC
  Administered 2015-07-21 – 2015-07-23 (×4): via INTRAVENOUS

## 2015-07-21 MED ORDER — ONDANSETRON HCL 4 MG/2ML IJ SOLN
INTRAMUSCULAR | Status: DC | PRN
  Administered 2015-07-21: 4 mg via INTRAVENOUS

## 2015-07-21 MED ORDER — ZOLPIDEM TARTRATE 5 MG PO TABS: 5.0000 mg | ORAL_TABLET | Freq: Every evening | ORAL | Status: DC | PRN

## 2015-07-21 MED ORDER — DIPHENHYDRAMINE HCL 12.5 MG/5ML PO ELIX: 12.5000 mg | ORAL_SOLUTION | Freq: Four times a day (QID) | ORAL | Status: DC | PRN

## 2015-07-21 MED ORDER — ONDANSETRON HCL 4 MG/2ML IJ SOLN: 4.0000 mg | INTRAMUSCULAR | Status: DC | PRN

## 2015-07-21 MED ORDER — CIPROFLOXACIN HCL 500 MG PO TABS
500.0000 mg | ORAL_TABLET | Freq: Two times a day (BID) | ORAL | Status: DC
  Administered 2015-07-21 – 2015-07-23 (×4): 500 mg via ORAL
  Filled 2015-07-21 (×4): qty 1

## 2015-07-21 MED ORDER — DIPHENHYDRAMINE HCL 50 MG/ML IJ SOLN: 12.5000 mg | Freq: Four times a day (QID) | INTRAMUSCULAR | Status: DC | PRN

## 2015-07-21 MED ORDER — CEFPODOXIME PROXETIL 200 MG PO TABS: 200.0000 mg | ORAL_TABLET | Freq: Two times a day (BID) | ORAL | Status: DC

## 2015-07-21 MED ORDER — LIDOCAINE HCL (CARDIAC) 20 MG/ML IV SOLN
INTRAVENOUS | Status: AC
  Filled 2015-07-21: qty 5

## 2015-07-21 MED ORDER — PROPOFOL 10 MG/ML IV BOLUS
INTRAVENOUS | Status: DC | PRN
  Administered 2015-07-21: 150 mg via INTRAVENOUS

## 2015-07-21 MED ORDER — HYDROMORPHONE HCL 1 MG/ML IJ SOLN: 0.5000 mg | INTRAMUSCULAR | Status: DC | PRN

## 2015-07-21 MED ORDER — EPHEDRINE SULFATE 50 MG/ML IJ SOLN
INTRAMUSCULAR | Status: DC | PRN
  Administered 2015-07-21 (×2): 5 mg via INTRAVENOUS

## 2015-07-21 MED ORDER — SODIUM CHLORIDE 0.9 % IJ SOLN
INTRAMUSCULAR | Status: AC
  Filled 2015-07-21: qty 10

## 2015-07-21 MED ORDER — FENTANYL CITRATE (PF) 100 MCG/2ML IJ SOLN
INTRAMUSCULAR | Status: DC | PRN
  Administered 2015-07-21: 50 ug via INTRAVENOUS
  Administered 2015-07-21 (×2): 25 ug via INTRAVENOUS

## 2015-07-21 MED ORDER — CEFAZOLIN SODIUM-DEXTROSE 2-3 GM-% IV SOLR
INTRAVENOUS | Status: AC
  Filled 2015-07-21: qty 50

## 2015-07-21 MED ORDER — SODIUM CHLORIDE 0.9 % IV SOLN
INTRAVENOUS | Status: DC
  Administered 2015-07-21: 1000 mL via INTRAVENOUS

## 2015-07-21 MED ORDER — DILTIAZEM HCL ER COATED BEADS 240 MG PO CP24
240.0000 mg | ORAL_CAPSULE | Freq: Every day | ORAL | Status: DC
  Administered 2015-07-22 – 2015-07-23 (×2): 240 mg via ORAL
  Filled 2015-07-21 (×2): qty 1

## 2015-07-21 MED ORDER — LACTATED RINGERS IV SOLN: INTRAVENOUS | Status: DC

## 2015-07-21 MED ORDER — SODIUM CHLORIDE 0.9 % IR SOLN
Status: DC | PRN
  Administered 2015-07-21 (×8): 3000 mL

## 2015-07-21 MED ORDER — SENNOSIDES-DOCUSATE SODIUM 8.6-50 MG PO TABS
2.0000 | ORAL_TABLET | Freq: Every day | ORAL | Status: DC
  Administered 2015-07-21 – 2015-07-22 (×2): 2 via ORAL
  Filled 2015-07-21 (×2): qty 2

## 2015-07-21 MED ORDER — CEFAZOLIN SODIUM-DEXTROSE 2-3 GM-% IV SOLR
2.0000 g | INTRAVENOUS | Status: AC
  Administered 2015-07-21: 2 g via INTRAVENOUS

## 2015-07-21 SURGICAL SUPPLY — 19 items
BAG URINE DRAINAGE (UROLOGICAL SUPPLIES) ×2 IMPLANT
BAG URO CATCHER STRL LF (DRAPE) ×3 IMPLANT
CATH HEMA 3WAY 30CC 22FR COUDE (CATHETERS) ×1 IMPLANT
CATH HEMA 3WAY 30CC 24FR COUDE (CATHETERS) ×2 IMPLANT
CLOTH BEACON ORANGE TIMEOUT ST (SAFETY) ×3 IMPLANT
ELECT LOOP 22F BIPOLAR SML (ELECTROSURGICAL) ×3
ELECTRODE LOOP 22F BIPOLAR SML (ELECTROSURGICAL) IMPLANT
GLOVE BIOGEL M STRL SZ7.5 (GLOVE) ×3 IMPLANT
GOWN STRL REUS W/TWL LRG LVL3 (GOWN DISPOSABLE) ×3 IMPLANT
GOWN STRL REUS W/TWL XL LVL3 (GOWN DISPOSABLE) ×3 IMPLANT
HOLDER FOLEY CATH W/STRAP (MISCELLANEOUS) ×2 IMPLANT
KIT ASPIRATION TUBING (SET/KITS/TRAYS/PACK) ×1 IMPLANT
LOOP CUT BIPOLAR 24F LRG (ELECTROSURGICAL) ×2 IMPLANT
MANIFOLD NEPTUNE II (INSTRUMENTS) ×3 IMPLANT
PACK CYSTO (CUSTOM PROCEDURE TRAY) ×3 IMPLANT
SYR 30ML LL (SYRINGE) ×2 IMPLANT
SYRINGE IRR TOOMEY STRL 70CC (SYRINGE) ×3 IMPLANT
TUBING CONNECTING 10 (TUBING) ×3 IMPLANT
TUBING CONNECTING 10' (TUBING) ×1

## 2015-07-21 NOTE — Anesthesia Procedure Notes (Signed)
Procedure Name: LMA Insertion Date/Time: 07/21/2015 12:02 PM Performed by: Glory Buff Pre-anesthesia Checklist: Emergency Drugs available, Suction available, Patient being monitored and Patient identified Patient Re-evaluated:Patient Re-evaluated prior to inductionOxygen Delivery Method: Circle system utilized Preoxygenation: Pre-oxygenation with 100% oxygen Intubation Type: IV induction LMA: LMA inserted LMA Size: 4.0 Number of attempts: 1 Placement Confirmation: positive ETCO2 Tube secured with: Tape

## 2015-07-21 NOTE — H&P (Signed)
Reason For Visit F/u to review urodynamics   Active Problems Problems  1. Hypogonadism, testicular (E29.1)   Assessed By: Rolan Bucco (Urology); Last Assessed: 10 Sep 2011 2. Nocturia (R35.1)   Assessed By: Rolan Bucco (Urology); Last Assessed: 10 Sep 2011 3. Obstructive uropathy (N13.9)   Assessed By: Carolan Clines (Urology); Last Assessed: 13 Jun 2015  History of Present Illness    74 year old male returns today to review urodynamic results. Hx of chronic urinary retention. He has chronic urinary tract infection, which is adequately treated with Levaquin. He has elevated postvoid residuals of 641 cc. Elevated postvoid residual has also been measured at 814 cc. he has failed finasteride and tamsulosin combination therapy the past.    The urodynamics is accomplished in the sitting position. The maximum cystometric capacity is 465 cc. Maximum capacity produces a noncompliant bladder. The first sensation is at 426 cc, and normal desire to void occurs at 465 cc. The patient never reaches a truly strong desire to void, because he develops an unstable bladder contraction, at 457 cc. The maximum unstable bladder contraction pressure is 20 cm water, with moderate urinary leakage.    The patient did not leak for Valsalva leak point pressure.    Pressure flow study shows a voided volume of 65 cc with maximum flow of 6 cc/s. The postvoid residual is 400 cc. The contour of the bladder is trabeculated. There is no reflux. Multiple bladder diverticula are noted.    The patient has a maximum capacity of 465 cc. There is loss of compliance. He has an unstable bladder contraction with involuntary voiding at maximum capacity. The patient has an attempt at voluntary voiding. He does not feel in control of that void. He generates a maximum detrusor pressure of 27 cm of water pressure. His postvoid residual is 400 cc. There is no reflux. His multiple bladder diverticula.   Past  Medical History Problems  1. History of hypercholesterolemia (Z86.39) 2. History of hypertension (Z86.79)  Surgical History Problems  1. History of Surgery Of Male Genitalia Vasectomy 2. History of Tonsillectomy  Current Meds 1. Amiodarone HCl - 200 MG Oral Tablet;  Therapy: (Recorded:10Aug2016) to Recorded 2. Cefpodoxime Proxetil 200 MG Oral Tablet;  Therapy: (Recorded:10Aug2016) to Recorded 3. Diltiazem HCl ER 240 MG Oral Capsule Extended Release 24 Hour;  Therapy: (Recorded:10Aug2016) to Recorded 4. Finasteride 5 MG Oral Tablet; TAKE ONE TABLET BY MOUTH DAILY  Requested for:  09Sep2016; Last Rx:09Sep2016 Ordered 5. Pantoprazole Sodium 40 MG Oral Tablet Delayed Release;  Therapy: (Recorded:10Aug2016) to Recorded 6. Sulfamethoxazole-Trimethoprim 400-80 MG Oral Tablet; TAKE 1 TABLET TWICE DAILY;  Therapy: 29Aug2016 to (Evaluate:03Sep2016)  Requested for: 29Aug2016; Last  Rx:29Aug2016 Ordered 7. Tamsulosin HCl - 0.4 MG Oral Capsule; TAKE 1 CAPSULE Daily  Requested for:  09Sep2016; Last Rx:09Sep2016 Ordered 8. Warfarin Sodium 5 MG Oral Tablet;  Therapy: (Recorded:10Aug2016) to Recorded  Allergies No Known Allergies  1. No Known Allergies  Family History Problems  1. Family history of Family Health Status - Father's Age : Father   47 2. Family history of Family Health Status - Mother's Age   70 3. Family history of Nephrolithiasis : Mother 4. Family history of Prostate Cancer : Father  Social History Problems  1. Denied: History of Alcohol Use 2. Caffeine Use   2 per day 3. Marital History - Currently Married 4. Never A Smoker 5. Occupation:   Radio producer 6. Denied: History of Tobacco Use  Review of Systems Genitourinary, constitutional, skin, eye, otolaryngeal, hematologic/lymphatic,  cardiovascular, pulmonary, endocrine, musculoskeletal, gastrointestinal, neurological and psychiatric system(s) were reviewed and pertinent findings if present are  noted and are otherwise negative.  Genitourinary: incomplete emptying of bladder and erectile dysfunction.  Constitutional: feeling tired (fatigue).  Integumentary: pruritus.  ENT: sinus problems.  Neurological: headache and dizziness.    Vitals Vital Signs [Data Includes: Last 1 Day]  Recorded: 23Sep2016 01:40PM  Blood Pressure: 162 / 88 Temperature: 98.5 F Heart Rate: 68  Procedure 16 fr foley changed without difficulty today.     Assessment Assessed  1. Obstructive uropathy (N13.9) 2. Urinary retention (R33.9) 3. Elevated prostate specific antigen (PSA) (R97.2)  75 yo male with severe obstructive BPH. We have discussed his videourodynamics, and I have shared with the patient and his wife the urodynamic cystogram showing the diverticula, and also the obstructive peak flow rate of 6cc/sed ( N = > 25cc/sec). I have outlined a plan for him including pre-op PT and then TURP, followed by post op PT, because I think he will have post op urinary incontnence, based on the nature of his obstruction, and poor voiding pattern, and presence of diverticula. However, I think it is good that he does not have any reflux. He currently has foley catheter in place.     I have asked him to discuss this wit his wife and he will let me know when he would like to begin pre op therapy.   Plan Obstructive uropathy  1. Stop: Tamsulosin HCl - 0.4 MG Oral Capsule 2. Renew: Finasteride 5 MG Oral Tablet; TAKE ONE TABLET BY MOUTH DAILY Obstructive uropathy, Urinary retention  3. PT/OT Referral Referral  Referral: pre and post TURP teaching  Status: Hold For -  Appointment,PreCert,Date of Service,Physical Therapy  Requested for: 23Sep2016 Urinary retention  4. Cath, simple, wIinsert Temp Cath; Status:Complete;   Done: [id number]  PT referral  TURP/   continue finasteride, but stop tamsulosin.   Discussion/Summary cc: Dr. Kriste Basque Electronically signed by : Carolan Clines, M.D.; Jun 13 2015  4:02PM EST

## 2015-07-21 NOTE — Interval H&P Note (Signed)
History and Physical Interval Note:  07/21/2015 11:53 AM  [name]  has presented today for surgery, with the diagnosis of URINARY RETENTION  The various methods of treatment have been discussed with the patient and family. After consideration of risks, benefits and other options for treatment, the patient has consented to  Procedure(s): TRANSURETHRAL RESECTION OF THE PROSTATE (TURP) (N/A) as a surgical intervention .  The patient's history has been reviewed, patient examined, no change in status, stable for surgery.  I have reviewed the patient's chart and labs.  Questions were answered to the patient's satisfaction.     Georgene Kopper I Rama Sorci

## 2015-07-21 NOTE — Interval H&P Note (Signed)
History and Physical Interval Note:  07/21/2015 11:53 AM  [name]  has presented today for surgery, with the diagnosis of URINARY RETENTION  The various methods of treatment have been discussed with the patient and family. After consideration of risks, benefits and other options for treatment, the patient has consented to  Procedure(s): TRANSURETHRAL RESECTION OF THE PROSTATE (TURP) (N/A) as a surgical intervention .  The patient's history has been reviewed, patient examined, no change in status, stable for surgery.  I have reviewed the patient's chart and labs.  Questions were answered to the patient's satisfaction.     [name]

## 2015-07-21 NOTE — Anesthesia Postprocedure Evaluation (Signed)
  Anesthesia Post-op Note  Patient: [name]  Procedure(s) Performed: Procedure(s): TRANSURETHRAL RESECTION OF THE PROSTATE (TURP) WITH CHIPS (N/A)  Patient Location: PACU  Anesthesia Type:General  Level of Consciousness: awake and alert   Airway and Oxygen Therapy: Patient Spontanous Breathing  Post-op Pain: Controlled  Post-op Assessment: Post-op Vital signs reviewed, Patient's Cardiovascular Status Stable and Respiratory Function Stable  Post-op Vital Signs: Reviewed  Filed Vitals:   07/21/15 1415  BP: 131/71  Pulse: 54  Temp:   Resp: 14    Complications: No apparent anesthesia complications

## 2015-07-21 NOTE — Transfer of Care (Signed)
Immediate Anesthesia Transfer of Care Note  Patient: [name]  Procedure(s) Performed: Procedure(s): TRANSURETHRAL RESECTION OF THE PROSTATE (TURP) WITH CHIPS (N/A)  Patient Location: PACU  Anesthesia Type:General  Level of Consciousness: awake, alert  and oriented  Airway & Oxygen Therapy: Patient Spontanous Breathing and Patient connected to face mask oxygen  Post-op Assessment: Report given to RN and Post -op Vital signs reviewed and stable  Post vital signs: Reviewed and stable  Last Vitals:  Filed Vitals:   07/21/15 0918  BP: 133/73  Pulse: 70  Temp: 37.1 C  Resp: 18    Complications: No apparent anesthesia complications

## 2015-07-21 NOTE — Anesthesia Preprocedure Evaluation (Addendum)
Anesthesia Evaluation  Patient identified by MRN, date of birth, ID band Patient awake    Reviewed: Allergy & Precautions, H&P , NPO status , Patient's Chart, lab work & pertinent test results  History of Anesthesia Complications (+) PONV  Airway Mallampati: III  TM Distance: >3 FB Neck ROM: Full    Dental no notable dental hx. (+) Teeth Intact, Dental Advisory Given   Pulmonary neg pulmonary ROS,    Pulmonary exam normal breath sounds clear to auscultation       Cardiovascular hypertension, Pt. on medications + dysrhythmias Atrial Fibrillation  Rhythm:Regular Rate:Normal     Neuro/Psych  Headaches, negative psych ROS   GI/Hepatic negative GI ROS, Neg liver ROS,   Endo/Other  negative endocrine ROS  Renal/GU Renal InsufficiencyRenal disease  negative genitourinary   Musculoskeletal   Abdominal   Peds  Hematology negative hematology ROS (+)   Anesthesia Other Findings   Reproductive/Obstetrics negative OB ROS                           Anesthesia Physical Anesthesia Plan  ASA: II  Anesthesia Plan: General   Post-op Pain Management:    Induction: Intravenous  Airway Management Planned: LMA  Additional Equipment:   Intra-op Plan:   Post-operative Plan: Extubation in OR  Informed Consent: I have reviewed the patients History and Physical, chart, labs and discussed the procedure including the risks, benefits and alternatives for the proposed anesthesia with the patient or authorized representative who has indicated his/her understanding and acceptance.   Dental advisory given  Plan Discussed with: CRNA  Anesthesia Plan Comments:         Anesthesia Quick Evaluation

## 2015-07-21 NOTE — Op Note (Signed)
Pre-operative diagnosis :   BPH  Postoperative diagnosis: Same   Operation:  Cystourethroscopy, TURP  Surgeon:  S. Gaynelle Arabian, MD  First assistant: None   Anesthesia:  General LMA    Preparation:   after appropriate preanesthesia, the patient was brought the operating, placed on the operative table in the dorsosupine position where general LMA anesthesia was introduced. He was then replaced in dorsal lithotomy position and this was prepped with Betadine solution and draped in usual fashion. The history was reviewed. The arm band was double checked.     Review history:    74 year old male returns today to review urodynamic results. Hx of chronic urinary retention. He has chronic urinary tract infection, which is adequately treated with Levaquin. He has elevated postvoid residuals of 641 cc. Elevated postvoid residual has also been measured at 814 cc. he has failed finasteride and tamsulosin combination therapy the past.    The urodynamics is accomplished in the sitting position. The maximum cystometric capacity is 465 cc. Maximum capacity produces a noncompliant bladder. The first sensation is at 426 cc, and normal desire to void occurs at 465 cc. The patient never reaches a truly strong desire to void, because he develops an unstable bladder contraction, at 457 cc. The maximum unstable bladder contraction pressure is 20 cm water, with moderate urinary leakage.    The patient did not leak for Valsalva leak point pressure.    Pressure flow study shows a voided volume of 65 cc with maximum flow of 6 cc/s. The postvoid residual is 400 cc. The contour of the bladder is trabeculated. There is no reflux. Multiple bladder diverticula are noted.    The patient has a maximum capacity of 465 cc. There is loss of compliance. He has an unstable bladder contraction with involuntary voiding at maximum capacity. The patient has an attempt at voluntary voiding. He does not feel in control of that  void. He generates a maximum detrusor pressure of 27 cm of water pressure. His postvoid residual is 400 cc. There is no reflux. His multiple bladder diverticula.    Statement of  Likelihood of Success: Excellent. TIME-OUT observed.:  Procedure:  Cystourethroscopy revealed massive BPH, with trilobar BPH and elevated bladder neck. The bladder showed trabeculation, cellule formation, and diverticular formation. No stones or tumors could be identified.  Resection was accomplished from the 11:00 to the 7:00 position, and the median lobe was resected, on the left side, because it was an eccentric median lobe. Bladder neck was resected as well. The left lateral lobe was left intact. Verumontanum was maintained in position for the entire procedure.  Chest were evacuated free from the bladder. It was elected to stop procedure after 90 minutes resection. A size 24 three-way Foley catheter was placed to traction and continuous irrigation. This felt the patient will need a second resection. However, I was concerned the patient may develop stress urinary incontinence if he has a complete resection.

## 2015-07-22 LAB — BASIC METABOLIC PANEL
Anion gap: 8 (ref 5–15)
BUN: 32 mg/dL — ABNORMAL HIGH (ref 6–20)
CO2: 26 mmol/L (ref 22–32)
Calcium: 8.7 mg/dL — ABNORMAL LOW (ref 8.9–10.3)
Chloride: 106 mmol/L (ref 101–111)
Creatinine, Ser: 2.24 mg/dL — ABNORMAL HIGH (ref 0.61–1.24)
GFR calc Af Amer: 32 mL/min — ABNORMAL LOW (ref >60)
GFR calc non Af Amer: 28 mL/min — ABNORMAL LOW (ref >60)
Glucose, Bld: 134 mg/dL — ABNORMAL HIGH (ref 65–99)
Potassium: 4 mmol/L (ref 3.5–5.1)
Sodium: 140 mmol/L (ref 135–145)

## 2015-07-22 LAB — HEMOGLOBIN AND HEMATOCRIT, BLOOD
HEMATOCRIT: 27 % — AB (ref 39.0–52.0)
HEMOGLOBIN: 8.8 g/dL — AB (ref 13.0–17.0)

## 2015-07-22 NOTE — Progress Notes (Signed)
Urology Progress Note  1 Day Post-Op   Subjective: Post TURP: Very large , bloody prostate: Rught side and BN TUR only.      No acute urologic events overnight. Ambulation:   negative Flatus:    positive Bowel movement  negative  Pain: some relief  Objective:  Blood pressure 161/78, pulse 66, temperature 98.1 F (36.7 C), temperature source Oral, resp. rate 14, height 5\' 6"  (1.676 m), weight 92.789 kg (204 lb 9 oz), SpO2 100 %.  Physical Exam:  General:  No acute distress, awake Extremities: extremities normal, atraumatic, no cyanosis or edema Genitourinary:   Benign bladder Foley: clots irrigated last night. Urine red this AM, irrigates pink.     I/O last 3 completed shifts: In: 21988.8 [I.V.:2788.8; [id number] Out: 20650 [Urine:20550; Blood:100]  Recent Labs     07/22/15  0432  HGB  8.8*    Recent Labs     07/22/15  0432  NA  140  K  4.0  CL  106  CO2  26  BUN  32*  CREATININE  2.24*  CALCIUM  8.7*  GFRNONAA  28*  GFRAA  32*      Assessment/Plan: Discussed surgery with pt: Findings of very large and bloody prostate. Concern at surgery for too much resetion leaving him incontinent. Resection of right lateral lobe and bladder neck. Very bloody. Left catheter, with traction and CBI. Note CKD ith elevated Cr.  Pt tostay today.  Catheter not removed. Continue any current medications. Pt may need to have more tissue removed.

## 2015-07-23 MED ORDER — OXYBUTYNIN CHLORIDE 5 MG PO TABS: ORAL_TABLET | ORAL | Status: DC

## 2015-07-23 MED ORDER — CIPROFLOXACIN HCL 500 MG PO TABS: 500.0000 mg | ORAL_TABLET | Freq: Two times a day (BID) | ORAL | Status: DC

## 2015-07-23 MED ORDER — OXYCODONE-ACETAMINOPHEN 5-325 MG PO TABS: 1.0000 | ORAL_TABLET | ORAL | Status: DC | PRN

## 2015-07-23 NOTE — Progress Notes (Signed)
Urology Progress Note  2 Days Post-Op   Subjective: AF. VSS urine pink     No acute urologic events overnight. Ambulation:   positive Flatus:    positive Bowel movement  negative  Pain: some relief. Still having spasms  Objective:  Blood pressure 141/81, pulse 62, temperature 97.9 F (36.6 C), temperature source Oral, resp. rate 16, height 5\' 6"  (1.676 m), weight 92.789 kg (204 lb 9 oz), SpO2 100 %.  Physical Exam:  General:  No acute distress, awake Extremities: extremities normal, atraumatic, no cyanosis or edema Genitourinary:  Normal bladder Foley: remains. CBI off    I/O last 3 completed shifts: In: [id number] [P.O.:660; I.V.:2933.8; Other:15100] Out: 22025 [Urine:22025]  Recent Labs     07/22/15  0432  HGB  8.8*    Recent Labs     07/22/15  0432  NA  140  K  4.0  CL  106  CO2  26  BUN  32*  CREATININE  2.24*  CALCIUM  8.7*  GFRNONAA  28*  GFRAA  32*     No results for input(s): INR, APTT in the last 72 hours.  Invalid input(s): PT   Invalid input(s): ABG  Assessment/Plan:  Catheter not removed. CBI to be removed. Will have d/c with foley and RTC for catheter removal and voiding trial.

## 2015-07-23 NOTE — Clinical Documentation Improvement (Signed)
Urology  Can the diagnosis of CKD be further specified?   CKD Stage I - GFR greater than or equal to 90  CKD Stage II - GFR 60-89  CKD Stage III - GFR 30-59  CKD Stage IV - GFR 15-29  CKD Stage V - GFR < 15  ESRD (End Stage Renal Disease)  Other condition  Unable to clinically determine   Supporting Information: : (risk factors, signs and symptoms, diagnostics, treatment)  07/22/15: GFR= 28  Please exercise your independent, professional judgment when responding. A specific answer is not anticipated or expected.   Thank You, Rolm Gala, RN, [place] [phone]

## 2015-07-23 NOTE — Discharge Instructions (Signed)
Benign Prostatic Hyperplasia An enlarged prostate (benign prostatic hyperplasia) is common in older men. You may experience the following:  Weak urine stream.  Dribbling.  Feeling like the bladder has not emptied completely.  Difficulty starting urination.  Getting up frequently at night to urinate.  Urinating more frequently during the day. HOME CARE INSTRUCTIONS  Monitor your prostatic hyperplasia for any changes. The following actions may help to alleviate any discomfort you are experiencing:  Give yourself time when you urinate.  Stay away from alcohol.  Avoid beverages containing caffeine, such as coffee, tea, and colas, because they can make the problem worse.  Avoid decongestants, antihistamines, and some prescription medicines that can make the problem worse.  Follow up with your health care provider for further treatment as recommended. SEEK MEDICAL CARE IF:  You are experiencing progressive difficulty voiding.  Your urine stream is progressively getting narrower.  You are awaking from sleep with the urge to void more frequently.  You are constantly feeling the need to void.  You experience loss of urine, especially in small amounts. SEEK IMMEDIATE MEDICAL CARE IF:   You develop increased pain with urination or are unable to urinate.  You develop severe abdominal pain, vomiting, a high fever, or fainting.  You develop back pain or blood in your urine. MAKE SURE YOU:   Understand these instructions.  Will watch your condition.  Will get help right away if you are not doing well or get worse.   This information is not intended to replace advice given to you by your health care provider. Make sure you discuss any questions you have with your health care provider.   Document Released: 09/06/2005 Document Revised: 09/27/2014 Document Reviewed: 02/06/2013 Elsevier Interactive Patient Education 2016 [employer]  

## 2015-07-24 NOTE — Discharge Summary (Signed)
Physician Discharge Summary  Patient ID: [name] MRN: [id number] DOB/AGE: [birth year] 72 y.o.  Admit date: 07/21/2015 Discharge date: 07/24/2015  Admission Diagnoses: URINARY RETENTION, BPH, Stage 4 CKD  Discharge Diagnoses:  Active Problems:   Benign localized hyperplasia of prostate with urinary obstruction Same  Discharged Condition: improved  Hospital Course: TURP  Significant Diagnostic Studies: No results found.  Discharge Exam: Blood pressure 141/81, pulse 62, temperature 97.9 F (36.6 C), temperature source Oral, resp. rate 16, height 5\' 6"  (1.676 m), weight 92.789 kg (204 lb 9 oz), SpO2 100 %.   Disposition: 01-Home or Self Care  Discharge Instructions    Discontinue IV    Complete by:  As directed      Irrigation of bladder    Complete by:  As directed   discontinue            Medication List    STOP taking these medications        warfarin 5 MG tablet  Commonly known as:  COUMADIN      TAKE these medications        amiodarone 200 MG tablet  Commonly known as:  PACERONE  Take 1 tablet (200 mg total) by mouth 2 (two) times daily.     cefpodoxime 200 MG tablet  Commonly known as:  VANTIN  Take 1 tablet (200 mg total) by mouth every 12 (twelve) hours.     ciprofloxacin 500 MG tablet  Commonly known as:  CIPRO  Take 1 tablet (500 mg total) by mouth 2 (two) times daily.     diltiazem 240 MG 24 hr capsule  Commonly known as:  CARDIZEM CD  Take 1 capsule (240 mg total) by mouth daily.     finasteride 5 MG tablet  Commonly known as:  PROSCAR  Take 1 tablet (5 mg total) by mouth daily.     oxybutynin 5 MG tablet  Commonly known as:  DITROPAN  Take 3x/day as needed for spasms.     oxyCODONE-acetaminophen 5-325 MG tablet  Commonly known as:  PERCOCET/ROXICET  Take 1-2 tablets by mouth every 4 (four) hours as needed for moderate pain.     pantoprazole 40 MG tablet  Commonly known as:  PROTONIX  Take 1 tablet (40 mg total) by mouth  daily at 12 noon.     tamsulosin 0.4 MG Caps capsule  Commonly known as:  FLOMAX  Take 1 capsule (0.4 mg total) by mouth daily.           Follow-up Information    Follow up with Ailene Rud, MD.   Specialty:  Urology   Why:  for catheter removal.    Contact information:   [place] [address] [phone]     Discharged with foley catheter in place. RTC for catheter removal.  Pt had right side TURP and Bladder neck TURP/median lobe. No left lateral lobe resection. Copndern for prostate size at resection, bleeding, and chance of post op incontinence. Pt understands that he may need further TURP in the future if he has continued difficulty voiding.  SignedAilene Rud 07/24/2015, 10:36 AM

## 2015-07-31 DIAGNOSIS — N400 Enlarged prostate without lower urinary tract symptoms: Secondary | ICD-10-CM | POA: Diagnosis not present

## 2015-07-31 DIAGNOSIS — N189 Chronic kidney disease, unspecified: Secondary | ICD-10-CM | POA: Diagnosis not present

## 2015-07-31 DIAGNOSIS — I480 Paroxysmal atrial fibrillation: Secondary | ICD-10-CM | POA: Diagnosis not present

## 2015-07-31 DIAGNOSIS — E785 Hyperlipidemia, unspecified: Secondary | ICD-10-CM | POA: Diagnosis not present

## 2015-07-31 DIAGNOSIS — I129 Hypertensive chronic kidney disease with stage 1 through stage 4 chronic kidney disease, or unspecified chronic kidney disease: Secondary | ICD-10-CM | POA: Diagnosis not present

## 2015-08-04 DIAGNOSIS — R339 Retention of urine, unspecified: Secondary | ICD-10-CM | POA: Diagnosis not present

## 2015-09-02 DIAGNOSIS — I4891 Unspecified atrial fibrillation: Secondary | ICD-10-CM | POA: Diagnosis not present

## 2015-09-02 DIAGNOSIS — I10 Essential (primary) hypertension: Secondary | ICD-10-CM | POA: Diagnosis not present

## 2015-09-02 DIAGNOSIS — N400 Enlarged prostate without lower urinary tract symptoms: Secondary | ICD-10-CM | POA: Diagnosis not present

## 2015-09-02 DIAGNOSIS — R739 Hyperglycemia, unspecified: Secondary | ICD-10-CM | POA: Diagnosis not present

## 2015-09-02 DIAGNOSIS — R972 Elevated prostate specific antigen [PSA]: Secondary | ICD-10-CM | POA: Diagnosis not present

## 2015-09-02 DIAGNOSIS — D649 Anemia, unspecified: Secondary | ICD-10-CM | POA: Diagnosis not present

## 2015-09-10 DIAGNOSIS — R339 Retention of urine, unspecified: Secondary | ICD-10-CM | POA: Diagnosis not present

## 2015-10-20 ENCOUNTER — Other Ambulatory Visit: Payer: Self-pay | Admitting: Urology

## 2015-10-30 ENCOUNTER — Encounter (HOSPITAL_COMMUNITY): Payer: Self-pay

## 2015-10-30 ENCOUNTER — Encounter (HOSPITAL_COMMUNITY)
Admission: RE | Admit: 2015-10-30 | Discharge: 2015-10-30 | Disposition: A | Discharge status: Home / Self Care | Payer: Medicare Other | Source: Ambulatory Visit | Attending: Urology | Admitting: Urology

## 2015-10-30 DIAGNOSIS — E785 Hyperlipidemia, unspecified: Secondary | ICD-10-CM | POA: Diagnosis not present

## 2015-10-30 DIAGNOSIS — Z01812 Encounter for preprocedural laboratory examination: Secondary | ICD-10-CM | POA: Diagnosis not present

## 2015-10-30 DIAGNOSIS — I480 Paroxysmal atrial fibrillation: Secondary | ICD-10-CM | POA: Diagnosis not present

## 2015-10-30 DIAGNOSIS — N400 Enlarged prostate without lower urinary tract symptoms: Secondary | ICD-10-CM | POA: Diagnosis not present

## 2015-10-30 DIAGNOSIS — I129 Hypertensive chronic kidney disease with stage 1 through stage 4 chronic kidney disease, or unspecified chronic kidney disease: Secondary | ICD-10-CM | POA: Diagnosis not present

## 2015-10-30 DIAGNOSIS — N189 Chronic kidney disease, unspecified: Secondary | ICD-10-CM | POA: Diagnosis not present

## 2015-10-30 HISTORY — DX: Benign prostatic hyperplasia with lower urinary tract symptoms: N40.1

## 2015-10-30 HISTORY — DX: Frequency of micturition: R35.0

## 2015-10-30 HISTORY — DX: Personal history of other medical treatment: Z92.89

## 2015-10-30 HISTORY — DX: Unspecified hearing loss, unspecified ear: H91.90

## 2015-10-30 HISTORY — DX: Retention of urine, unspecified: R33.9

## 2015-10-30 LAB — CBC
HCT: 36.4 % — ABNORMAL LOW (ref 39.0–52.0)
Hemoglobin: 11.9 g/dL — ABNORMAL LOW (ref 13.0–17.0)
MCH: 31.3 pg (ref 26.0–34.0)
MCHC: 32.7 g/dL (ref 30.0–36.0)
MCV: 95.8 fL (ref 78.0–100.0)
PLATELETS: 270 10*3/uL (ref 150–400)
RBC: 3.8 MIL/uL — ABNORMAL LOW (ref 4.22–5.81)
RDW: 13.3 % (ref 11.5–15.5)
WBC: 4.8 10*3/uL (ref 4.0–10.5)

## 2015-10-30 LAB — BASIC METABOLIC PANEL
ANION GAP: 8 (ref 5–15)
BUN: 30 mg/dL — AB (ref 6–20)
CALCIUM: 9.3 mg/dL (ref 8.9–10.3)
CO2: 28 mmol/L (ref 22–32)
CREATININE: 2.04 mg/dL — AB (ref 0.61–1.24)
Chloride: 102 mmol/L (ref 101–111)
GFR, EST AFRICAN AMERICAN: 36 mL/min — AB (ref >60)
GFR, EST NON AFRICAN AMERICAN: 31 mL/min — AB (ref >60)
GLUCOSE: 99 mg/dL (ref 65–99)
Potassium: 4.5 mmol/L (ref 3.5–5.1)
Sodium: 138 mmol/L (ref 135–145)

## 2015-10-30 NOTE — Progress Notes (Signed)
10-30-15 1545 Labs viewable in [place], note BUN/Creatinine.

## 2015-10-30 NOTE — Patient Instructions (Addendum)
[place].  10/30/2015   Your procedure is scheduled on: 11-07-15  Report to [facility] Main  Entrance take [facility]  elevators to 3rd floor to  [place] at 0600  AM.  Call this number if you have problems the morning of surgery [phone]   Remember: ONLY 1 PERSON MAY GO WITH YOU TO SHORT STAY TO GET  READY MORNING OF [place].  Do not eat food or drink liquids :After Midnight.     Take these medicines the morning of surgery with A SIP OF WATER: Amiodarone. Cartia. Finasteride. DO NOT TAKE ANY DIABETIC MEDICATIONS DAY OF YOUR SURGERY                               You may not have any metal on your body including hair pins and              piercings  Do not wear jewelry, make-up, lotions, powders or perfumes, deodorant             Do not wear nail polish.  Do not shave  48 hours prior to surgery.              Men may shave face and neck.   Do not bring valuables to the hospital. [place].  Contacts, dentures or bridgework may not be worn into surgery.  Leave suitcase in the car. After surgery it may be brought to your room.     Patients discharged the day of surgery will not be allowed to drive home.  Name and phone number of your driver: Inez Catalina- spouse [phone] cell  Special Instructions: N/A              Please read over the following fact sheets you were given: _____________________________________________________________________             [facility] - Preparing for Surgery Before surgery, you can play an important role.  Because skin is not sterile, your skin needs to be as free of germs as possible.  You can reduce the number of germs on your skin by washing with CHG (chlorahexidine gluconate) soap before surgery.  CHG is an antiseptic cleaner which kills germs and bonds with the skin to continue killing germs even after washing. Please DO NOT use if you have an allergy to CHG  or antibacterial soaps.  If your skin becomes reddened/irritated stop using the CHG and inform your nurse when you arrive at Short Stay. Do not shave (including legs and underarms) for at least 48 hours prior to the first CHG shower.  You may shave your face/neck. Please follow these instructions carefully:  1.  Shower with CHG Soap the night before surgery and the  morning of Surgery.  2.  If you choose to wash your hair, wash your hair first as usual with your  normal  shampoo.  3.  After you shampoo, rinse your hair and body thoroughly to remove the  shampoo.                           4.  Use CHG as you would any other liquid soap.  You can apply chg directly  to the skin and wash                       Gently with a scrungie or clean washcloth.  5.  Apply the CHG Soap to your body ONLY FROM THE NECK DOWN.   Do not use on face/ open                           Wound or open sores. Avoid contact with eyes, ears mouth and genitals (private parts).                       Wash face,  Genitals (private parts) with your normal soap.             6.  Wash thoroughly, paying special attention to the area where your surgery  will be performed.  7.  Thoroughly rinse your body with warm water from the neck down.  8.  DO NOT shower/wash with your normal soap after using and rinsing off  the CHG Soap.                9.  Pat yourself dry with a clean towel.            10.  Wear clean pajamas.            11.  Place clean sheets on your bed the night of your first shower and do not  sleep with pets. Day of Surgery : Do not apply any lotions/deodorants the morning of surgery.  Please wear clean clothes to the hospital/surgery center.  FAILURE TO FOLLOW THESE INSTRUCTIONS MAY RESULT IN THE CANCELLATION OF YOUR SURGERY PATIENT SIGNATURE_________________________________  NURSE SIGNATURE__________________________________  ________________________________________________________________________

## 2015-10-30 NOTE — Pre-Procedure Instructions (Addendum)
EKG 8'16, Echo 7'16, CXR 10'16 Epic. Pt. Denies any diarrhea or GI symptoms at present time.

## 2015-11-05 NOTE — Anesthesia Preprocedure Evaluation (Addendum)
Anesthesia Evaluation  Patient identified by MRN, date of birth, ID band Patient awake    Reviewed: Allergy & Precautions, H&P , NPO status , Patient's Chart, lab work & pertinent test results, reviewed documented beta blocker date and time   History of Anesthesia Complications (+) PONV  Airway Mallampati: III  TM Distance: >3 FB Neck ROM: Full    Dental no notable dental hx. (+) Teeth Intact, Dental Advisory Given   Pulmonary neg pulmonary ROS,    Pulmonary exam normal breath sounds clear to auscultation       Cardiovascular hypertension, Pt. on medications + dysrhythmias Atrial Fibrillation  Rhythm:Regular Rate:Normal     Neuro/Psych  Headaches, negative psych ROS   GI/Hepatic negative GI ROS, Neg liver ROS,   Endo/Other  negative endocrine ROS  Renal/GU Renal InsufficiencyRenal disease  negative genitourinary   Musculoskeletal   Abdominal   Peds  Hematology negative hematology ROS (+)   Anesthesia Other Findings PONV (postoperative nausea and vomiting)  Hypertension    Headache   migraines  Complication of anesthesia     Urinary retention     Benign prostatic hypertrophy with urinary frequency     Dysrhythmia   h/o Atrial Fibrillation- Dr. Terrence Dupont follows.  Transfusion history  last 04-24-15   Hearing loss          Reproductive/Obstetrics negative OB ROS                            Anesthesia Physical  Anesthesia Plan  ASA: II  Anesthesia Plan: General   Post-op Pain Management:    Induction: Intravenous  Airway Management Planned: LMA  Additional Equipment:   Intra-op Plan:   Post-operative Plan: Extubation in OR  Informed Consent: I have reviewed the patients History and Physical, chart, labs and discussed the procedure including the risks, benefits and alternatives for the proposed anesthesia with the patient or authorized representative who has  indicated his/her understanding and acceptance.   Dental advisory given  Plan Discussed with: CRNA  Anesthesia Plan Comments: (Easy LMA for prior TURP)        Anesthesia Quick Evaluation

## 2015-11-07 ENCOUNTER — Encounter (HOSPITAL_COMMUNITY): Payer: Self-pay | Admitting: *Deleted

## 2015-11-07 ENCOUNTER — Observation Stay (HOSPITAL_COMMUNITY)
Admission: RE | Admit: 2015-11-07 | Discharge: 2015-11-08 | Disposition: A | Discharge status: Home / Self Care | Payer: Medicare Other | Source: Ambulatory Visit | Attending: Urology | Admitting: Urology

## 2015-11-07 ENCOUNTER — Ambulatory Visit (HOSPITAL_COMMUNITY): Payer: Medicare Other | Admitting: Anesthesiology

## 2015-11-07 ENCOUNTER — Encounter (HOSPITAL_COMMUNITY)
Admission: RE | Disposition: A | Discharge status: Home / Self Care | Payer: Self-pay | Source: Ambulatory Visit | Attending: Urology

## 2015-11-07 DIAGNOSIS — Z79899 Other long term (current) drug therapy: Secondary | ICD-10-CM | POA: Insufficient documentation

## 2015-11-07 DIAGNOSIS — I10 Essential (primary) hypertension: Secondary | ICD-10-CM | POA: Diagnosis not present

## 2015-11-07 DIAGNOSIS — N400 Enlarged prostate without lower urinary tract symptoms: Secondary | ICD-10-CM | POA: Diagnosis present

## 2015-11-07 DIAGNOSIS — N289 Disorder of kidney and ureter, unspecified: Secondary | ICD-10-CM | POA: Insufficient documentation

## 2015-11-07 DIAGNOSIS — E7800 Pure hypercholesterolemia, unspecified: Secondary | ICD-10-CM | POA: Diagnosis not present

## 2015-11-07 DIAGNOSIS — N3289 Other specified disorders of bladder: Secondary | ICD-10-CM | POA: Insufficient documentation

## 2015-11-07 DIAGNOSIS — N5201 Erectile dysfunction due to arterial insufficiency: Secondary | ICD-10-CM | POA: Insufficient documentation

## 2015-11-07 DIAGNOSIS — E291 Testicular hypofunction: Secondary | ICD-10-CM | POA: Insufficient documentation

## 2015-11-07 DIAGNOSIS — R351 Nocturia: Secondary | ICD-10-CM | POA: Insufficient documentation

## 2015-11-07 DIAGNOSIS — N401 Enlarged prostate with lower urinary tract symptoms: Principal | ICD-10-CM | POA: Insufficient documentation

## 2015-11-07 DIAGNOSIS — R972 Elevated prostate specific antigen [PSA]: Secondary | ICD-10-CM | POA: Diagnosis not present

## 2015-11-07 DIAGNOSIS — N138 Other obstructive and reflux uropathy: Secondary | ICD-10-CM | POA: Insufficient documentation

## 2015-11-07 DIAGNOSIS — Z7901 Long term (current) use of anticoagulants: Secondary | ICD-10-CM | POA: Insufficient documentation

## 2015-11-07 DIAGNOSIS — N323 Diverticulum of bladder: Secondary | ICD-10-CM | POA: Diagnosis not present

## 2015-11-07 DIAGNOSIS — R338 Other retention of urine: Secondary | ICD-10-CM | POA: Diagnosis not present

## 2015-11-07 DIAGNOSIS — I4891 Unspecified atrial fibrillation: Secondary | ICD-10-CM | POA: Insufficient documentation

## 2015-11-07 HISTORY — PX: TRANSURETHRAL RESECTION OF PROSTATE: SHX73

## 2015-11-07 SURGERY — TURP (TRANSURETHRAL RESECTION OF PROSTATE)
Anesthesia: General

## 2015-11-07 MED ORDER — DIPHENHYDRAMINE HCL 50 MG/ML IJ SOLN: 12.5000 mg | Freq: Four times a day (QID) | INTRAMUSCULAR | Status: DC | PRN

## 2015-11-07 MED ORDER — HYDROMORPHONE HCL 1 MG/ML IJ SOLN: 0.5000 mg | INTRAMUSCULAR | Status: DC | PRN

## 2015-11-07 MED ORDER — SODIUM CHLORIDE 0.9 % IR SOLN
3000.0000 mL | Status: DC
  Administered 2015-11-07 – 2015-11-08 (×9): 3000 mL

## 2015-11-07 MED ORDER — EPHEDRINE SULFATE 50 MG/ML IJ SOLN
INTRAMUSCULAR | Status: DC | PRN
  Administered 2015-11-07: 5 mg via INTRAVENOUS

## 2015-11-07 MED ORDER — FENTANYL CITRATE (PF) 250 MCG/5ML IJ SOLN
INTRAMUSCULAR | Status: AC
  Filled 2015-11-07: qty 5

## 2015-11-07 MED ORDER — FENTANYL CITRATE (PF) 100 MCG/2ML IJ SOLN: 25.0000 ug | INTRAMUSCULAR | Status: DC | PRN

## 2015-11-07 MED ORDER — ONDANSETRON HCL 4 MG/2ML IJ SOLN
INTRAMUSCULAR | Status: DC | PRN
  Administered 2015-11-07: 4 mg via INTRAVENOUS

## 2015-11-07 MED ORDER — SODIUM CHLORIDE 0.45 % IV SOLN
INTRAVENOUS | Status: DC
  Administered 2015-11-07 – 2015-11-08 (×3): via INTRAVENOUS

## 2015-11-07 MED ORDER — SULFAMETHOXAZOLE-TRIMETHOPRIM 800-160 MG PO TABS
1.0000 | ORAL_TABLET | Freq: Two times a day (BID) | ORAL | Status: DC
  Administered 2015-11-07 – 2015-11-08 (×3): 1 via ORAL
  Filled 2015-11-07 (×3): qty 1

## 2015-11-07 MED ORDER — SENNA 8.6 MG PO TABS
1.0000 | ORAL_TABLET | Freq: Two times a day (BID) | ORAL | Status: DC
  Administered 2015-11-07 – 2015-11-08 (×2): 8.6 mg via ORAL
  Filled 2015-11-07 (×2): qty 1

## 2015-11-07 MED ORDER — GLYCOPYRROLATE 0.2 MG/ML IJ SOLN
INTRAMUSCULAR | Status: AC
  Filled 2015-11-07: qty 1

## 2015-11-07 MED ORDER — BELLADONNA ALKALOIDS-OPIUM 16.2-60 MG RE SUPP
RECTAL | Status: AC
  Filled 2015-11-07: qty 1

## 2015-11-07 MED ORDER — PANTOPRAZOLE SODIUM 40 MG PO TBEC
40.0000 mg | DELAYED_RELEASE_TABLET | Freq: Every day | ORAL | Status: DC
  Administered 2015-11-07 – 2015-11-08 (×2): 40 mg via ORAL
  Filled 2015-11-07 (×3): qty 1

## 2015-11-07 MED ORDER — LIDOCAINE HCL (CARDIAC) 20 MG/ML IV SOLN
INTRAVENOUS | Status: AC
  Filled 2015-11-07: qty 5

## 2015-11-07 MED ORDER — TAMSULOSIN HCL 0.4 MG PO CAPS
0.4000 mg | ORAL_CAPSULE | Freq: Every day | ORAL | Status: DC
  Administered 2015-11-07 – 2015-11-08 (×2): 0.4 mg via ORAL
  Filled 2015-11-07 (×2): qty 1

## 2015-11-07 MED ORDER — DIPHENHYDRAMINE HCL 12.5 MG/5ML PO ELIX: 12.5000 mg | ORAL_SOLUTION | Freq: Four times a day (QID) | ORAL | Status: DC | PRN

## 2015-11-07 MED ORDER — EPHEDRINE SULFATE 50 MG/ML IJ SOLN
INTRAMUSCULAR | Status: AC
  Filled 2015-11-07: qty 1

## 2015-11-07 MED ORDER — ZOLPIDEM TARTRATE 5 MG PO TABS: 5.0000 mg | ORAL_TABLET | Freq: Every evening | ORAL | Status: DC | PRN

## 2015-11-07 MED ORDER — ACETAMINOPHEN 10 MG/ML IV SOLN
INTRAVENOUS | Status: AC
  Filled 2015-11-07: qty 100

## 2015-11-07 MED ORDER — PHENYLEPHRINE HCL 10 MG/ML IJ SOLN
10.0000 mg | INTRAVENOUS | Status: DC | PRN
  Administered 2015-11-07: 50 ug/min via INTRAVENOUS

## 2015-11-07 MED ORDER — BELLADONNA ALKALOIDS-OPIUM 16.2-60 MG RE SUPP
RECTAL | Status: DC | PRN
  Administered 2015-11-07: 1 via RECTAL

## 2015-11-07 MED ORDER — PHENYLEPHRINE HCL 10 MG/ML IJ SOLN
INTRAMUSCULAR | Status: DC | PRN
  Administered 2015-11-07: 80 ug via INTRAVENOUS

## 2015-11-07 MED ORDER — OXYCODONE-ACETAMINOPHEN 5-325 MG PO TABS: 1.0000 | ORAL_TABLET | ORAL | Status: DC | PRN

## 2015-11-07 MED ORDER — FENTANYL CITRATE (PF) 100 MCG/2ML IJ SOLN
INTRAMUSCULAR | Status: DC | PRN
Start: 2015-11-07 — End: 2015-11-07
  Administered 2015-11-07: 50 ug via INTRAVENOUS

## 2015-11-07 MED ORDER — ONDANSETRON HCL 4 MG/2ML IJ SOLN
INTRAMUSCULAR | Status: AC
  Filled 2015-11-07: qty 2

## 2015-11-07 MED ORDER — SODIUM CHLORIDE 0.9 % IR SOLN
Status: DC | PRN
  Administered 2015-11-07: 21000 mL

## 2015-11-07 MED ORDER — PROPOFOL 10 MG/ML IV BOLUS
INTRAVENOUS | Status: DC | PRN
  Administered 2015-11-07: 50 mg via INTRAVENOUS
  Administered 2015-11-07: 150 mg via INTRAVENOUS

## 2015-11-07 MED ORDER — LIDOCAINE HCL (CARDIAC) 10 MG/ML IV SOLN
INTRAVENOUS | Status: DC | PRN
  Administered 2015-11-07: 100 mg via INTRAVENOUS

## 2015-11-07 MED ORDER — DEXAMETHASONE SODIUM PHOSPHATE 10 MG/ML IJ SOLN
INTRAMUSCULAR | Status: DC | PRN
  Administered 2015-11-07: 10 mg via INTRAVENOUS

## 2015-11-07 MED ORDER — ACETAMINOPHEN 325 MG PO TABS: 650.0000 mg | ORAL_TABLET | ORAL | Status: DC | PRN

## 2015-11-07 MED ORDER — PHENYLEPHRINE HCL 10 MG/ML IJ SOLN
INTRAMUSCULAR | Status: AC
  Filled 2015-11-07: qty 1

## 2015-11-07 MED ORDER — ACETAMINOPHEN 10 MG/ML IV SOLN
INTRAVENOUS | Status: DC | PRN
  Administered 2015-11-07: 1000 mg via INTRAVENOUS

## 2015-11-07 MED ORDER — MIDAZOLAM HCL 5 MG/5ML IJ SOLN
INTRAMUSCULAR | Status: DC | PRN
  Administered 2015-11-07: 0.5 mg via INTRAVENOUS

## 2015-11-07 MED ORDER — CEFAZOLIN SODIUM-DEXTROSE 2-3 GM-% IV SOLR
2.0000 g | INTRAVENOUS | Status: AC
  Administered 2015-11-07: 2 g via INTRAVENOUS

## 2015-11-07 MED ORDER — ROCURONIUM BROMIDE 100 MG/10ML IV SOLN
INTRAVENOUS | Status: AC
  Filled 2015-11-07: qty 1

## 2015-11-07 MED ORDER — CEFAZOLIN SODIUM-DEXTROSE 2-3 GM-% IV SOLR
INTRAVENOUS | Status: AC
  Filled 2015-11-07: qty 50

## 2015-11-07 MED ORDER — STERILE WATER FOR IRRIGATION IR SOLN
Status: DC | PRN
  Administered 2015-11-07: 30 mL

## 2015-11-07 MED ORDER — ONDANSETRON HCL 4 MG/2ML IJ SOLN: 4.0000 mg | INTRAMUSCULAR | Status: DC | PRN

## 2015-11-07 MED ORDER — SODIUM CHLORIDE 0.9 % IV SOLN
INTRAVENOUS | Status: DC | PRN
  Administered 2015-11-07 (×2): via INTRAVENOUS

## 2015-11-07 MED ORDER — ACETAMINOPHEN 160 MG/5ML PO SOLN: 650.0000 mg | Freq: Once | ORAL | Status: DC

## 2015-11-07 MED ORDER — BACITRACIN-NEOMYCIN-POLYMYXIN 400-5-5000 EX OINT: 1.0000 "application " | TOPICAL_OINTMENT | Freq: Three times a day (TID) | CUTANEOUS | Status: DC | PRN

## 2015-11-07 MED ORDER — GLYCOPYRROLATE 0.2 MG/ML IJ SOLN
INTRAMUSCULAR | Status: DC | PRN
  Administered 2015-11-07: 0.2 mg via INTRAVENOUS

## 2015-11-07 MED ORDER — MIDAZOLAM HCL 2 MG/2ML IJ SOLN
INTRAMUSCULAR | Status: AC
  Filled 2015-11-07: qty 2

## 2015-11-07 MED ORDER — PROPOFOL 10 MG/ML IV BOLUS
INTRAVENOUS | Status: AC
  Filled 2015-11-07: qty 40

## 2015-11-07 MED ORDER — DEXAMETHASONE SODIUM PHOSPHATE 10 MG/ML IJ SOLN
INTRAMUSCULAR | Status: AC
  Filled 2015-11-07: qty 1

## 2015-11-07 SURGICAL SUPPLY — 16 items
BAG URINE DRAINAGE (UROLOGICAL SUPPLIES) IMPLANT
BAG URO CATCHER STRL LF (MISCELLANEOUS) ×3 IMPLANT
CATH HEMA 3WAY 30CC 22FR COUDE (CATHETERS) ×3 IMPLANT
CLOTH BEACON ORANGE TIMEOUT ST (SAFETY) ×3 IMPLANT
GLOVE BIOGEL M STRL SZ7.5 (GLOVE) ×3 IMPLANT
GOWN STRL REUS W/TWL LRG LVL3 (GOWN DISPOSABLE) ×3 IMPLANT
GOWN STRL REUS W/TWL XL LVL3 (GOWN DISPOSABLE) ×3 IMPLANT
HOLDER FOLEY CATH W/STRAP (MISCELLANEOUS) IMPLANT
KIT ASPIRATION TUBING (SET/KITS/TRAYS/PACK) ×3 IMPLANT
LOOP CUT BIPOLAR 24F LRG (ELECTROSURGICAL) ×2 IMPLANT
MANIFOLD NEPTUNE II (INSTRUMENTS) ×3 IMPLANT
PACK CYSTO (CUSTOM PROCEDURE TRAY) ×3 IMPLANT
SYR 30ML LL (SYRINGE) ×2 IMPLANT
SYRINGE IRR TOOMEY STRL 70CC (SYRINGE) ×3 IMPLANT
TUBING CONNECTING 10 (TUBING) ×4 IMPLANT
TUBING CONNECTING 10' (TUBING) ×2

## 2015-11-07 NOTE — Anesthesia Postprocedure Evaluation (Signed)
Anesthesia Post Note  Patient: [name]  Procedure(s) Performed: Procedure(s) (LRB): TRANSURETHRAL RESECTION OF THE PROSTATE (TURP) (N/A)  Patient location during evaluation: PACU Anesthesia Type: General Level of consciousness: sedated Pain management: satisfactory to patient Vital Signs Assessment: post-procedure vital signs reviewed and stable Respiratory status: spontaneous breathing Cardiovascular status: stable Anesthetic complications: no    Last Vitals:  Filed Vitals:   11/07/15 1136 11/07/15 1450  BP: 129/72 102/55  Pulse: 50 57  Temp: 36.4 C 36.4 C  Resp: 16 16    Last Pain:  Filed Vitals:   11/07/15 1451  PainSc: 0-No pain                 [name]

## 2015-11-07 NOTE — Progress Notes (Signed)
Foley cath not draining. Thick red drainage noted in tubing. Hand irrigated with 104ml NS. Small clots returned. Cath draining easily. Clots in tubing of drainage bag. New bag applied.

## 2015-11-07 NOTE — Progress Notes (Signed)
Continue patient care at 1545, Agree with prior nurse's assessment, no new changed noted. Will continue to assess patient.

## 2015-11-07 NOTE — Care Management Note (Signed)
Case Management Note  Patient Details  Name: [name] MRN: [id number] Date of Birth: 1941/09/02  Subjective/Objective: 75 y/o m admitted w/BPH,s/p TURP.                   Action/Plan:d/c plan home.   Expected Discharge Date:                  Expected Discharge Plan:  Home/Self Care  In-House Referral:     Discharge planning Services  CM Consult  Post Acute Care Choice:    Choice offered to:     DME Arranged:    DME Agency:     HH Arranged:    HH Agency:     Status of Service:  In process, will continue to follow  Medicare Important Message Given:    Date Medicare IM Given:    Medicare IM give by:    Date Additional Medicare IM Given:    Additional Medicare Important Message give by:     If discussed at [place] of Stay Meetings, dates discussed:    Additional Comments:  Dessa Phi, RN 11/07/2015, 12:00 PM

## 2015-11-07 NOTE — Op Note (Signed)
Pre-operative diagnosis :  BPH  Postoperative diagnosis:  Same  Operation: Second Stage  TURP  Surgeon:  S. Gaynelle Arabian, MD  First assistant:  None  Anesthesia:  General LMA  Preparation:  After appropriate anesthesia, the patient was brought the operating room, placed on the operative table in the dorsal supine position where general LMA anesthesia was introduced. He was then replaced in dorsal lithotomy position with the pubis was prepped with Betadine solution and draped in the usual fashion. The history was reviewed the armband was double checked.  Review history:   1. Benign prostatic hyperplasia with urinary obstruction (N40.1,N13.8)  Assessed By: Carolan Clines (Urology); Last Assessed: 15 Oct 2015 2. Elevated prostate specific antigen (PSA) (R97.20)  Assessed By: Carolan Clines (Urology); Last Assessed: 13 Jun 2015 3. Erectile dysfunction due to arterial insufficiency (N52.01) 4. Hypogonadism, testicular (E29.1)  Assessed By: Rolan Bucco (Urology); Last Assessed: 10 Sep 2011 5. Nocturia (R35.1)  Assessed By: Carolan Clines (Urology); Last Assessed: 15 Oct 2015  History of Present Illness    75 yo male returns today for a cystoscopy, flowrate, PVR. He is s/p TURP on 07/21/15, Pathology showed BPH only. He rarely voids on his own & mainly SIC.     Hx of chronic urinary retention. He has chronic urinary tract infection, which is adequately treated with Levaquin. He has elevated postvoid residuals of 641 cc. Elevated postvoid residual has also been measured at 814 cc. he has failed finasteride and tamsulosin combination therapy the past.    The urodynamics is accomplished in the sitting position. The maximum cystometric capacity is 465 cc. Maximum capacity produces a noncompliant bladder. The first sensation is at 426 cc, and normal desire to void occurs at 465 cc. The patient never reaches a truly strong desire to void, because he develops an  unstable bladder contraction, at 457 cc. The maximum unstable bladder contraction pressure is 20 cm water, with moderate urinary leakage.    The patient did not leak for Valsalva leak point pressure.    Pressure flow study shows a voided volume of 65 cc with maximum flow of 6 cc/s. The postvoid residual is 400 cc. The contour of the bladder is trabeculated. There is no reflux. Multiple bladder diverticula are noted.    The patient has a maximum capacity of 465 cc. There is loss of compliance. He has an unstable bladder contraction with involuntary voiding at maximum capacity. The patient has an attempt at voluntary voiding. He does not feel in control of that void. He generates a maximum detrusor pressure of 27 cm of water pressure. His postvoid residual is 400 cc. There is no reflux. His multiple bladder diverticula.    Statement of  Likelihood of Success: Excellent. TIME-OUT observed.:  Procedure:  Cystourethroscopy revealed previous transurethral resection of the right side of the prostate. 0 was identified. The bladder neck was resected on the right side. There was remaining tissue noted on the right lateral lobe of the prostate. Trabeculation and cellule formation was identified. The trabeculation was severe, and cellules were also identified with diverticular formation as well. No bladder stone or tumor was identified. Clear reflux was seen from the ureteral orifices. As noted in the office, median lobe was noted at the 1:00 to the 4:00 position. Attention was first directed to the right lateral lobe, and this was re-resected, to the level of the vera. Attention was then directed to the left lateral lobe, and this was resected from the 1:00 to the 6:00 positions,  with careful attention for hemostasis, and resection of the median lobe, and this eccentric position. Following this, chips were evacuated from the bladder, and a size 22 hematuria catheter, was placed to traction, and three-way  irrigation. The patient was awakened and taken to recovery room in good condition. He received a B and O suppository. He tolerated the procedure well. He also received 2 g of IV Ancef prior to the procedure per anesthesia protocol.

## 2015-11-07 NOTE — Transfer of Care (Signed)
Immediate Anesthesia Transfer of Care Note  Patient: [name]  Procedure(s) Performed: Procedure(s): TRANSURETHRAL RESECTION OF THE PROSTATE (TURP) (N/A)  Patient Location: PACU  Anesthesia Type:General  Level of Consciousness: awake, alert , oriented, sedated and patient cooperative  Airway & Oxygen Therapy: Patient Spontanous Breathing and Patient connected to face mask oxygen  Post-op Assessment: Report given to RN, Post -op Vital signs reviewed and stable and Patient moving all extremities X 4  Post vital signs: stable  Last Vitals:  Filed Vitals:   11/07/15 0605  BP: 152/91  Pulse: 66  Temp: 36.6 C  Resp: 18    Complications: No apparent anesthesia complications

## 2015-11-07 NOTE — Interval H&P Note (Signed)
History and Physical Interval Note:  11/07/2015 7:55 AM  [name]  has presented today for surgery, with the diagnosis of BPH  The various methods of treatment have been discussed with the patient and family. After consideration of risks, benefits and other options for treatment, the patient has consented to  Procedure(s): TRANSURETHRAL RESECTION OF THE PROSTATE (TURP) (N/A) as a surgical intervention .  The patient's history has been reviewed, patient examined, no change in status, stable for surgery.  I have reviewed the patient's chart and labs.  Questions were answered to the patient's satisfaction.     Sorin Frimpong I Nathian Stencil

## 2015-11-07 NOTE — Anesthesia Procedure Notes (Addendum)
Procedure Name: LMA Insertion Date/Time: 11/07/2015 8:03 AM Performed by: Enrigue Catena E Pre-anesthesia Checklist: Patient identified, Emergency Drugs available, Suction available and Patient being monitored Patient Re-evaluated:Patient Re-evaluated prior to inductionOxygen Delivery Method: Circle System Utilized Preoxygenation: Pre-oxygenation with 100% oxygen Intubation Type: IV induction Ventilation: Mask ventilation without difficulty LMA: LMA with gastric port inserted LMA Size: 5.0 Laryngoscope size: oral sump down gastric port and stomach decompressed. Tube type: Oral Number of attempts: 1 Placement Confirmation: positive ETCO2 Tube secured with: Tape Dental Injury: Teeth and Oropharynx as per pre-operative assessment

## 2015-11-07 NOTE — Progress Notes (Signed)
Catheter clotted, CBI stopped to hand irrigate. Irrigated with normal saline multiple times until no return of clots. Restarted CBI, Foley draining. Will continue to monitor.

## 2015-11-07 NOTE — H&P (Signed)
Reason For Visit Cystoscopy, flowrate & PVR   Active Problems Problems  1. Benign prostatic hyperplasia with urinary obstruction (N40.1,N13.8)   Assessed By: Carolan Clines (Urology); Last Assessed: 15 Oct 2015 2. Elevated prostate specific antigen (PSA) (R97.20)   Assessed By: Carolan Clines (Urology); Last Assessed: 13 Jun 2015 3. Erectile dysfunction due to arterial insufficiency (N52.01) 4. Hypogonadism, testicular (E29.1)   Assessed By: Rolan Bucco (Urology); Last Assessed: 10 Sep 2011 5. Nocturia (R35.1)   Assessed By: Carolan Clines (Urology); Last Assessed: 15 Oct 2015  History of Present Illness     75 yo male returns today for a cystoscopy, flowrate, PVR. He is s/p TURP on 07/21/15, Pathology showed BPH only. He rarely voids on his own & mainly SIC.     Hx of chronic urinary retention. He has chronic urinary tract infection, which is adequately treated with Levaquin. He has elevated postvoid residuals of 641 cc. Elevated postvoid residual has also been measured at 814 cc. he has failed finasteride and tamsulosin combination therapy the past.    The urodynamics is accomplished in the sitting position. The maximum cystometric capacity is 465 cc. Maximum capacity produces a noncompliant bladder. The first sensation is at 426 cc, and normal desire to void occurs at 465 cc. The patient never reaches a truly strong desire to void, because he develops an unstable bladder contraction, at 457 cc. The maximum unstable bladder contraction pressure is 20 cm water, with moderate urinary leakage.    The patient did not leak for Valsalva leak point pressure.    Pressure flow study shows a voided volume of 65 cc with maximum flow of 6 cc/s. The postvoid residual is 400 cc. The contour of the bladder is trabeculated. There is no reflux. Multiple bladder diverticula are noted.    The patient has a maximum capacity of 465 cc. There is loss of compliance. He has  an unstable bladder contraction with involuntary voiding at maximum capacity. The patient has an attempt at voluntary voiding. He does not feel in control of that void. He generates a maximum detrusor pressure of 27 cm of water pressure. His postvoid residual is 400 cc. There is no reflux. His multiple bladder diverticula.   Past Medical History Problems  1. History of hypercholesterolemia (Z86.39) 2. History of hypertension (Z86.79) 3. History of Obstructive uropathy (N13.9)  Surgical History Problems  1. History of Surgery Vas Deferens Vasectomy 2. History of Tonsillectomy 3. History of Transurethral Resection Of Prostate (TURP)  Current Meds 1. Amiodarone HCl - 200 MG Oral Tablet;  Therapy: (Recorded:10Aug2016) to Recorded 2. Cefpodoxime Proxetil 200 MG Oral Tablet;  Therapy: (Recorded:10Aug2016) to Recorded 3. DiltiaZEM HCl ER 240 MG Oral Capsule Extended Release 24 Hour;  Therapy: (Recorded:10Aug2016) to Recorded 4. Finasteride 5 MG Oral Tablet; TAKE ONE TABLET BY MOUTH DAILY  Requested for:  23Sep2016; Last Rx:23Sep2016 Ordered 5. Pantoprazole Sodium 40 MG Oral Tablet Delayed Release;  Therapy: (Recorded:10Aug2016) to Recorded 6. Warfarin Sodium 5 MG Oral Tablet;  Therapy: (Recorded:10Aug2016) to Recorded  Allergies No Known Allergies  1. No Known Allergies  Family History Problems  1. Family history of Family Health Status - Father's Age : Father   3 2. Family history of Family Health Status - Mother's Age   24 3. Family history of Nephrolithiasis : Mother 4. Family history of Prostate Cancer : Father  Social History Problems  1. Activities of daily living (ADL's), independent 2. Denied: History of Alcohol Use (History) 3. Caffeine Use   2  per day 4. Exercise habits   Mostly sedentary but has a treadmill 5. Marital History - Currently Married 6. Never A Smoker 7. Occupation:   Radio producer 8. Denied: History of Tobacco Use  Review of  Systems Genitourinary, constitutional, skin, eye, otolaryngeal, hematologic/lymphatic, cardiovascular, pulmonary, endocrine, musculoskeletal, gastrointestinal, neurological and psychiatric system(s) were reviewed and pertinent findings if present are noted and are otherwise negative.  Genitourinary: incomplete emptying of bladder and erectile dysfunction.  Constitutional: feeling tired (fatigue).  Integumentary: pruritus.  ENT: sinus problems.  Neurological: headache and dizziness.    Vitals Vital Signs [Data Includes: Last 1 Day]  Recorded: 25Jan2017 10:55AM  Blood Pressure: 131 / 80 Temperature: 97.7 F Heart Rate: 67  Physical Exam Constitutional: Well nourished and well developed . No acute distress.  ENT:. The ears and nose are normal in appearance.  Neck: The appearance of the neck is normal and no neck mass is present.  Pulmonary: No respiratory distress and normal respiratory rhythm and effort.  Cardiovascular: Heart rate and rhythm are normal . No peripheral edema.  Abdomen: The abdomen is soft and nontender. No masses are palpated. No CVA tenderness. No hernias are palpable. No hepatosplenomegaly noted.  Rectal: Rectal exam demonstrates normal sphincter tone, no tenderness and no masses. Estimated prostate size is 4+. The prostate has no nodularity and is not tender. The left seminal vesicle is nonpalpable. The right seminal vesicle is nonpalpable. The perineum is normal on inspection.  Genitourinary: Examination of the penis demonstrates no discharge, no masses, no lesions and a normal meatus. The scrotum is without lesions. The right epididymis is palpably normal and non-tender. The left epididymis is palpably normal and non-tender. The right testis is palpably normal, non-tender and without masses. The left testis is normal, non-tender and without masses.  Lymphatics: The femoral and inguinal nodes are not enlarged or tender.  Skin: Normal skin turgor, no visible rash and no  visible skin lesions.  Neuro/Psych:. Mood and affect are appropriate.    Results/Data  Flow Rate: Voided 240 ml. A peak flow rate of 77ml/s and mean flow rate of 32ml/s.  PVR: Ultrasound PVR 293 ml.    Procedure  Procedure: Cystoscopy  Chaperone Present: kim Bobby Rumpf.  Indication: Lower Urinary Tract Symptoms.  Informed Consent: Risks, benefits, and potential adverse events were discussed and informed consent was obtained from the patient.  Prep: The patient was prepped with betadine.  Anesthesia:. Local anesthesia was administered intraurethrally with 2% lidocaine jelly.  Antibiotic prophylaxis: Ciprofloxacin.  Procedure Note:  Urethral meatus:. No abnormalities. Retracted glans.  Anterior urethra: No abnormalities.  Prostatic urethra:. There was visual obstruction of the prostatic urethra. The lateral and median prostatic lobes were enlarged. An enlarged intravesical median lobe was visualized. Large superior intravesical median lobe. Webbing scar at bladder neck.  Bladder: Visulization was clear. The ureteral orifices were in the normal anatomic position bilaterally. Examination of the bladder demonstrated trabeculation, but no clot within the bladder and no diverticulum no erythematous mucosa, no ulcer, no edema and no cellules. The patient tolerated the procedure well.  Complications: None.    Assessment Assessed  1. Benign prostatic hyperplasia with urinary obstruction (N40.1,N13.8) 2. Nocturia (R35.1)  Mr [name] continues to have elevated pvr, with cysto showing large intravesical median lobe ( dorsal), with Left Lateral Lobe still present. He had resection of his bladder neck and Right lateral lobe previously. He still has obstructive symptoms and has median lobe present, with elevated pvr. He has a peak flow of only 12  cc/sec ( N > 25cc/sec).   Plan Proceed to 2nd stage TURP for Left side TURP and median lobe TURP.   Signatures Electronically signed by : Carolan Clines,  M.D.; Oct 15 2015 12:02PM EST

## 2015-11-08 DIAGNOSIS — R338 Other retention of urine: Secondary | ICD-10-CM | POA: Diagnosis not present

## 2015-11-08 DIAGNOSIS — E291 Testicular hypofunction: Secondary | ICD-10-CM | POA: Diagnosis not present

## 2015-11-08 DIAGNOSIS — Z79899 Other long term (current) drug therapy: Secondary | ICD-10-CM | POA: Diagnosis not present

## 2015-11-08 DIAGNOSIS — R972 Elevated prostate specific antigen [PSA]: Secondary | ICD-10-CM | POA: Diagnosis not present

## 2015-11-08 DIAGNOSIS — I10 Essential (primary) hypertension: Secondary | ICD-10-CM | POA: Diagnosis not present

## 2015-11-08 DIAGNOSIS — N138 Other obstructive and reflux uropathy: Secondary | ICD-10-CM | POA: Diagnosis not present

## 2015-11-08 DIAGNOSIS — I4891 Unspecified atrial fibrillation: Secondary | ICD-10-CM | POA: Diagnosis not present

## 2015-11-08 DIAGNOSIS — N401 Enlarged prostate with lower urinary tract symptoms: Secondary | ICD-10-CM | POA: Diagnosis not present

## 2015-11-08 DIAGNOSIS — Z7901 Long term (current) use of anticoagulants: Secondary | ICD-10-CM | POA: Diagnosis not present

## 2015-11-08 DIAGNOSIS — N3289 Other specified disorders of bladder: Secondary | ICD-10-CM | POA: Diagnosis not present

## 2015-11-08 DIAGNOSIS — E7800 Pure hypercholesterolemia, unspecified: Secondary | ICD-10-CM | POA: Diagnosis not present

## 2015-11-08 DIAGNOSIS — N323 Diverticulum of bladder: Secondary | ICD-10-CM | POA: Diagnosis not present

## 2015-11-08 DIAGNOSIS — N289 Disorder of kidney and ureter, unspecified: Secondary | ICD-10-CM | POA: Diagnosis not present

## 2015-11-08 MED ORDER — SULFAMETHOXAZOLE-TRIMETHOPRIM 800-160 MG PO TABS: 1.0000 | ORAL_TABLET | Freq: Two times a day (BID) | ORAL | Status: DC

## 2015-11-08 MED ORDER — PHENAZOPYRIDINE HCL 200 MG PO TABS: 200.0000 mg | ORAL_TABLET | Freq: Three times a day (TID) | ORAL | Status: DC | PRN

## 2015-11-08 MED ORDER — HYDROCODONE-ACETAMINOPHEN 5-300 MG PO TABS: 5.0000 mg | ORAL_TABLET | Freq: Four times a day (QID) | ORAL | Status: DC | PRN

## 2015-11-08 NOTE — Progress Notes (Signed)
Urology Progress Note  1 Day Post-Op   Subjective: urine clear. Pt doing well.      No acute urologic events overnight. Ambulation:   positive Flatus:    positive Bowel movement  negative  Pain: some relief  Objective:  Blood pressure 179/97, pulse 80, temperature 97.9 F (36.6 C), temperature source Oral, resp. rate 18, height 5\' 6"  (1.676 m), weight 93.611 kg (206 lb 6 oz), SpO2 96 %.  Physical Exam:  General:  No acute distress, awake  Genitourinary:  Normal penis Foley: clear urine.     I/O last 3 completed shifts: In: 36514.2 [I.V.:2514.2; [id number] Out: [id number] [Urine:35825; Blood:150]  No results for input(s): HGB, WBC, PLT in the last 72 hours.  No results for input(s): NA, K, CL, CO2, BUN, CREATININE, CALCIUM, GFRNONAA, GFRAA in the last 72 hours.  Invalid input(s): MAGNESIUM   No results for input(s): INR, APTT in the last 72 hours.  Invalid input(s): PT   Invalid input(s): ABG  Assessment/Plan:  Catheter not removed. Continue any current medications. Follow up in 2 days for foley removal

## 2015-11-08 NOTE — Discharge Instructions (Signed)
Benign Prostatic Hyperplasia An enlarged prostate (benign prostatic hyperplasia) is common in older men. You may experience the following:  Weak urine stream.  Dribbling.  Feeling like the bladder has not emptied completely.  Difficulty starting urination.  Getting up frequently at night to urinate.  Urinating more frequently during the day. HOME CARE INSTRUCTIONS  Monitor your prostatic hyperplasia for any changes. The following actions may help to alleviate any discomfort you are experiencing:  Give yourself time when you urinate.  Stay away from alcohol.  Avoid beverages containing caffeine, such as coffee, tea, and colas, because they can make the problem worse.  Avoid decongestants, antihistamines, and some prescription medicines that can make the problem worse.  Follow up with your health care provider for further treatment as recommended. SEEK MEDICAL CARE IF:  You are experiencing progressive difficulty voiding.  Your urine stream is progressively getting narrower.  You are awaking from sleep with the urge to void more frequently.  You are constantly feeling the need to void.  You experience loss of urine, especially in small amounts. SEEK IMMEDIATE MEDICAL CARE IF:   You develop increased pain with urination or are unable to urinate.  You develop severe abdominal pain, vomiting, a high fever, or fainting.  You develop back pain or blood in your urine. MAKE SURE YOU:   Understand these instructions.  Will watch your condition.  Will get help right away if you are not doing well or get worse.   This information is not intended to replace advice given to you by your health care provider. Make sure you discuss any questions you have with your health care provider.   Document Released: 09/06/2005 Document Revised: 09/27/2014 Document Reviewed: 02/06/2013 Elsevier Interactive Patient Education 2016 [employer]  

## 2015-11-08 NOTE — Progress Notes (Signed)
Patient is alert and oriented x4, ambulatory. Discharge instructions reviewed. Patient encouraged to adhere to plan of care. Additional instruction provided regarding foley care. Pt will be discharged to home with foley

## 2015-11-08 NOTE — Discharge Summary (Signed)
Physician Discharge Summary  Patient ID: [name] MRN: [id number] DOB/AGE: 73-Sep-1942 73 y.o.  Admit date: 11/07/2015 Discharge date: 11/08/2015  Admission Diagnoses: BPH  Discharge Diagnoses:  Active Problems:   Benign prostatic hypertrophy   Discharged Condition: stable  Hospital Course: TURP  Significant Diagnostic Studies: No results found.  Discharge Exam: Blood pressure 179/97, pulse 80, temperature 97.9 F (36.6 C), temperature source Oral, resp. rate 18, height 5\' 6"  (1.676 m), weight 93.611 kg (206 lb 6 oz), SpO2 96 %.   Disposition: 01-Home or Self Care  Discharge Instructions    Care order/instruction    Complete by:  As directed   Antibiotic ointment oto penis/catheter junction 2-3x/day.  Wash blood and scab from catheter pen.     Discharge patient    Complete by:  As directed      Discontinue IV    Complete by:  As directed             Medication List    TAKE these medications        amiodarone 200 MG tablet  Commonly known as:  PACERONE  Take 1 tablet (200 mg total) by mouth 2 (two) times daily.     cefpodoxime 200 MG tablet  Commonly known as:  VANTIN  Take 1 tablet (200 mg total) by mouth every 12 (twelve) hours.     ciprofloxacin 500 MG tablet  Commonly known as:  CIPRO  Take 1 tablet (500 mg total) by mouth 2 (two) times daily.     diltiazem 240 MG 24 hr capsule  Commonly known as:  CARDIZEM CD  Take 1 capsule (240 mg total) by mouth daily.     docusate sodium 100 MG capsule  Commonly known as:  COLACE  Take 100 mg by mouth daily.     finasteride 5 MG tablet  Commonly known as:  PROSCAR  Take 1 tablet (5 mg total) by mouth daily.     Hydrocodone-Acetaminophen 5-300 MG Tabs  Commonly known as:  VICODIN  Take 5 mg by mouth every 6 (six) hours as needed.     IRON PO  Take 1 tablet by mouth every morning.     oxybutynin 5 MG tablet  Commonly known as:  DITROPAN  Take 3x/day as needed for spasms.     oxyCODONE-acetaminophen 5-325 MG tablet  Commonly known as:  PERCOCET/ROXICET  Take 1-2 tablets by mouth every 4 (four) hours as needed for moderate pain.     pantoprazole 40 MG tablet  Commonly known as:  PROTONIX  Take 1 tablet (40 mg total) by mouth daily at 12 noon.     phenazopyridine 200 MG tablet  Commonly known as:  PYRIDIUM  Take 1 tablet (200 mg total) by mouth 3 (three) times daily as needed for pain.     sulfamethoxazole-trimethoprim 800-160 MG tablet  Commonly known as:  BACTRIM DS,SEPTRA DS  Take 1 tablet by mouth 2 (two) times daily.     tamsulosin 0.4 MG Caps capsule  Commonly known as:  FLOMAX  Take 1 capsule (0.4 mg total) by mouth daily.           Follow-up Information    Follow up with Ailene Rud, MD.   Specialty:  Urology   Why:  for voiding trial   Contact information:   [facility] [place] [address] [phone]      RTC Monday for voiding trial Signed: Avaree Gilberti I Eusebia Grulke 11/08/2015, 9:50 AM

## 2015-11-08 NOTE — Care Management Note (Signed)
Case Management Note  Patient Details  Name: [name] MRN: [id number] Date of Birth: [birth year]  Subjective/Objective:         TURP           Action/Plan: NCM spoke to pt and states he lives at home with wife. States he is independent at home. Wife at home to assist with his care. He can afford his medications and no DME needed for ambulation. No NCM needs identified.   Expected Discharge Date:  11/08/2015               Expected Discharge Plan:  Home/Self Care  In-House Referral:  NA  Discharge planning Services  CM Consult  Post Acute Care Choice:  NA Choice offered to:  NA  DME Arranged:  N/A DME Agency:  NA  HH Arranged:  NA HH Agency:  NA  Status of Service:  Completed, signed off  Medicare Important Message Given:    Date Medicare IM Given:    Medicare IM give by:    Date Additional Medicare IM Given:    Additional Medicare Important Message give by:     If discussed at [place] of Stay Meetings, dates discussed:    Additional Comments:  Erenest Rasher, RN 11/08/2015, 12:13 PM

## 2015-11-08 NOTE — Care Management Obs Status (Signed)
[place] NOTIFICATION   Patient Details  Name: [name] MRN: [id number] Date of Birth: 1940/10/13   Medicare Observation Status Notification Given:  Yes    Erenest Rasher, RN 11/08/2015, 11:26 AM

## 2015-11-14 DIAGNOSIS — R338 Other retention of urine: Secondary | ICD-10-CM | POA: Diagnosis not present

## 2015-11-27 DIAGNOSIS — I4891 Unspecified atrial fibrillation: Secondary | ICD-10-CM | POA: Diagnosis not present

## 2015-11-27 DIAGNOSIS — I10 Essential (primary) hypertension: Secondary | ICD-10-CM | POA: Diagnosis not present

## 2015-11-27 DIAGNOSIS — N390 Urinary tract infection, site not specified: Secondary | ICD-10-CM | POA: Diagnosis not present

## 2015-12-04 DIAGNOSIS — I4891 Unspecified atrial fibrillation: Secondary | ICD-10-CM | POA: Diagnosis not present

## 2015-12-04 DIAGNOSIS — D649 Anemia, unspecified: Secondary | ICD-10-CM | POA: Diagnosis not present

## 2015-12-04 DIAGNOSIS — I10 Essential (primary) hypertension: Secondary | ICD-10-CM | POA: Diagnosis not present

## 2015-12-04 DIAGNOSIS — R972 Elevated prostate specific antigen [PSA]: Secondary | ICD-10-CM | POA: Diagnosis not present

## 2015-12-10 DIAGNOSIS — N400 Enlarged prostate without lower urinary tract symptoms: Secondary | ICD-10-CM | POA: Diagnosis not present

## 2015-12-10 DIAGNOSIS — N184 Chronic kidney disease, stage 4 (severe): Secondary | ICD-10-CM | POA: Diagnosis not present

## 2015-12-10 DIAGNOSIS — N320 Bladder-neck obstruction: Secondary | ICD-10-CM | POA: Diagnosis not present

## 2016-01-24 IMAGING — DX DG CHEST 2V
2 series · 2 of 2 positions shown · non-contrast
Comparison: 04/14/2015

CLINICAL DATA: Preop for trans urethral resection of the prostate.
History of hypertension.

EXAM:
CHEST  2 VIEW

[chest pa]
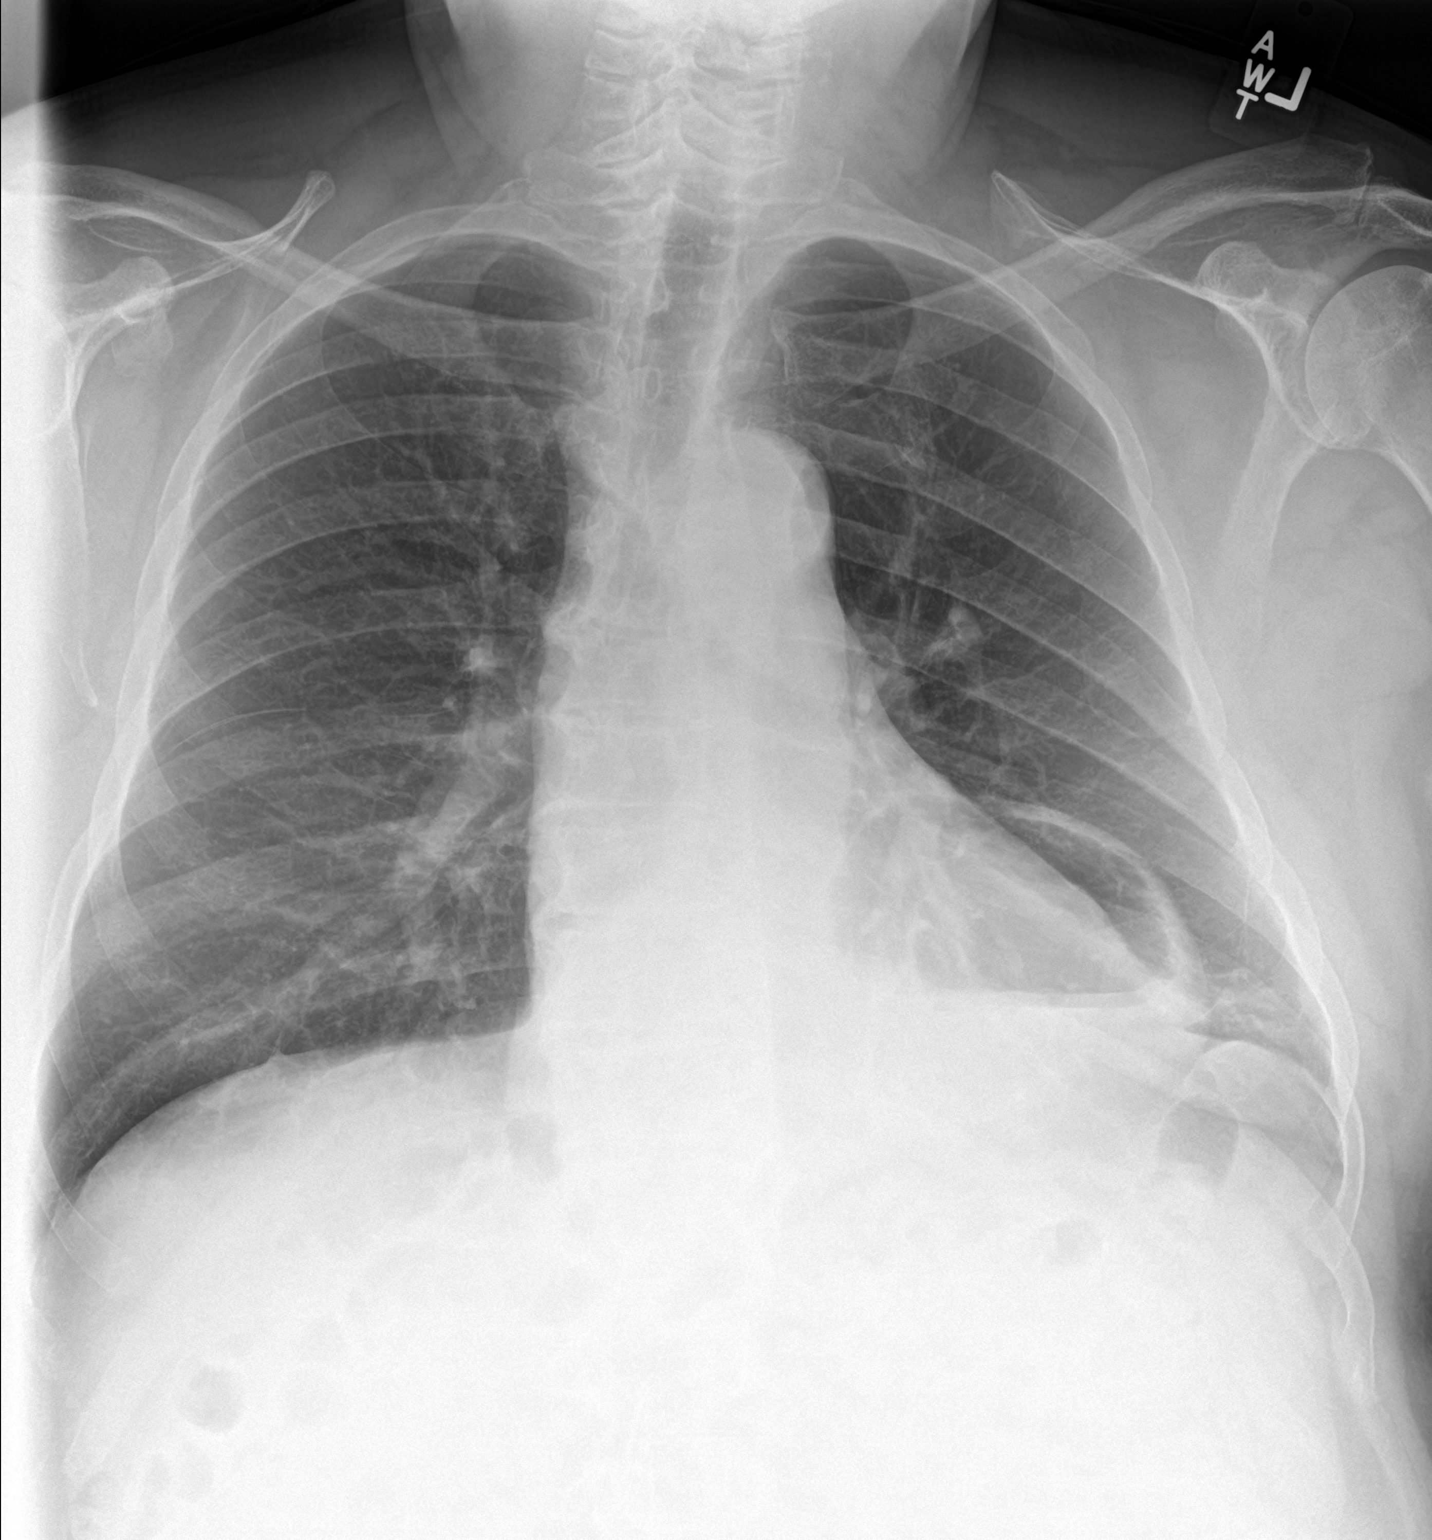

[chest lat]
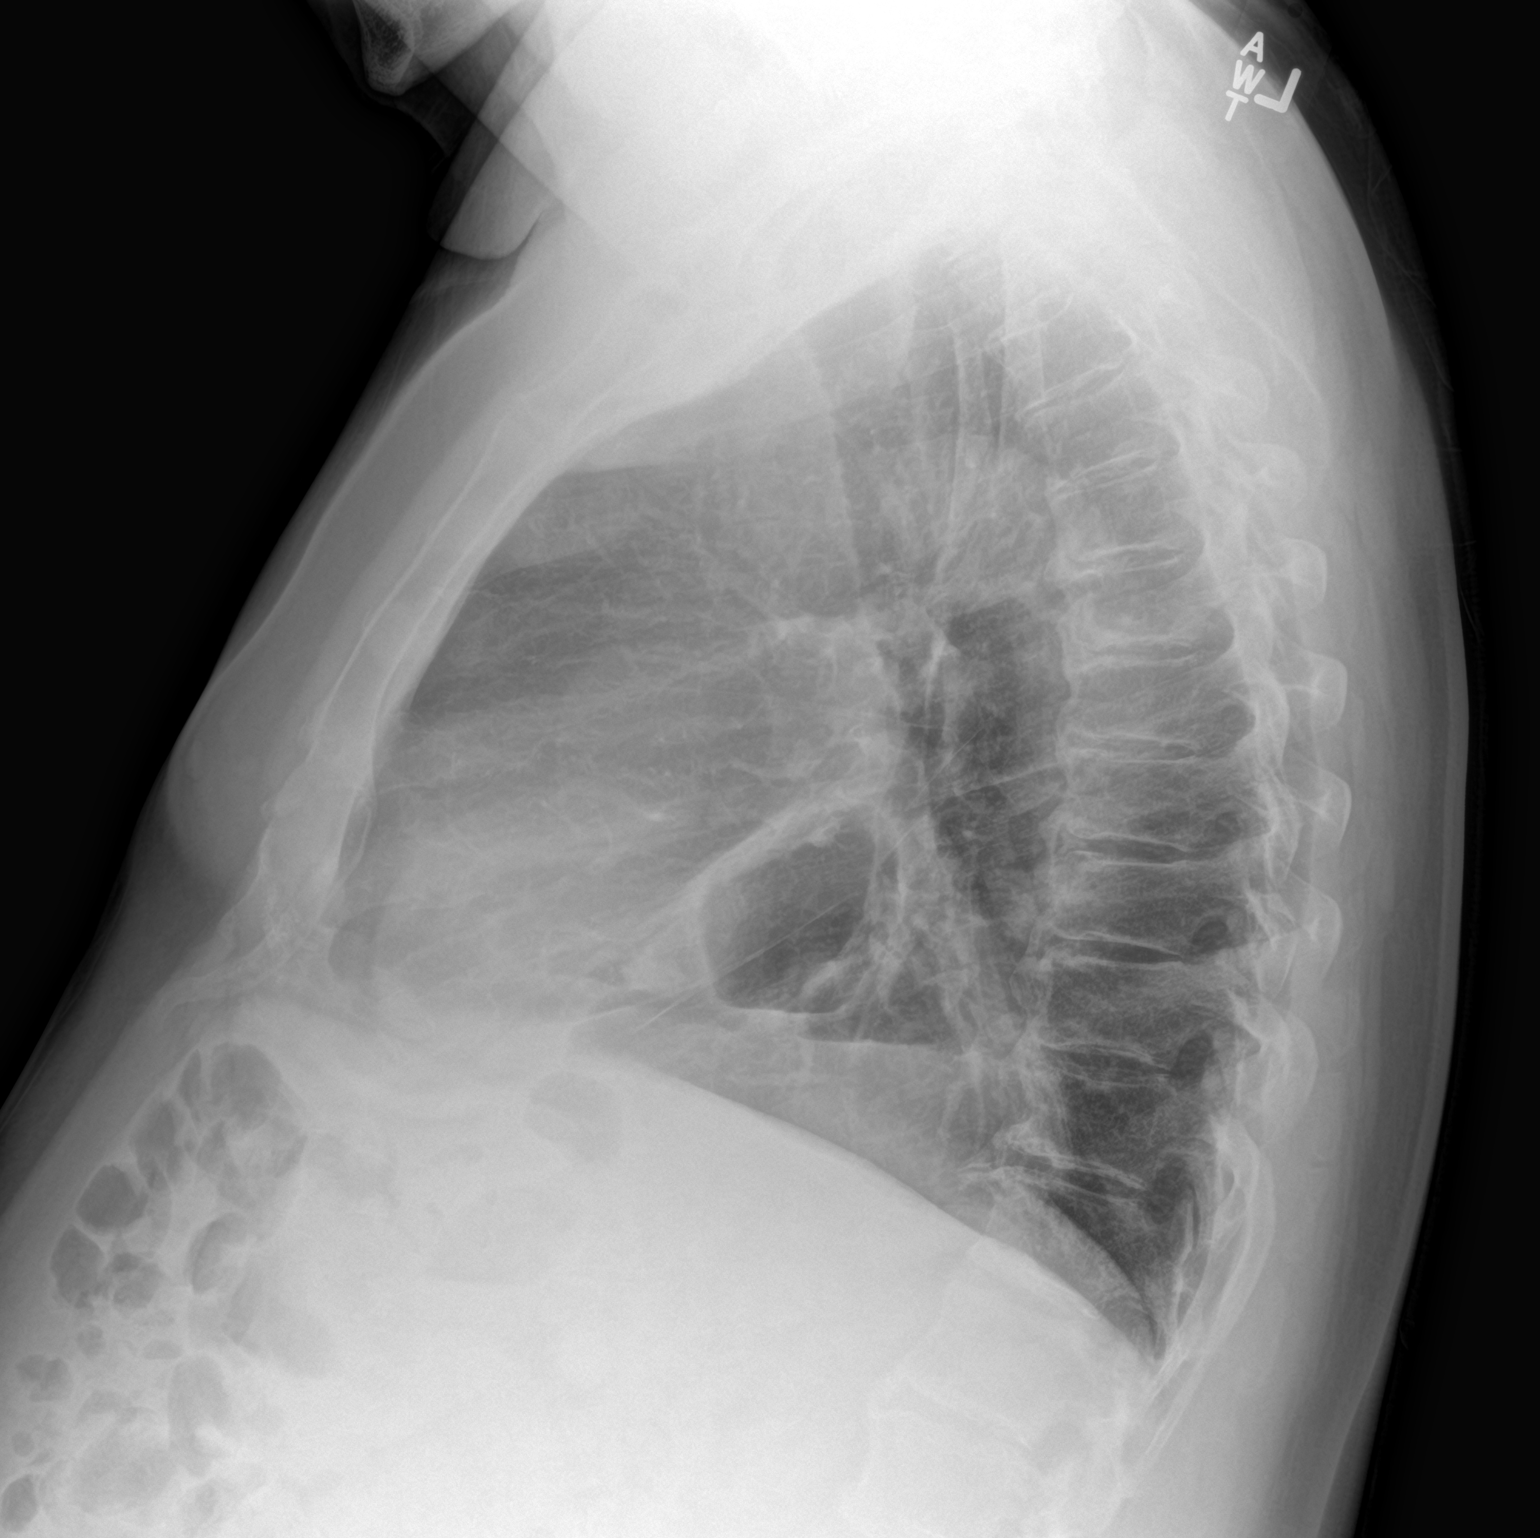

[2 of 2 positions shown; findings below may reference images not displayed]

FINDINGS: Cardiac silhouette is normal in size and configuration. There is a
large hiatal hernia. Mediastinum otherwise normal in contour and
caliber. The no hilar masses or adenopathy.

Mild linear opacity at the left lung base likely atelectasis. Lungs
otherwise clear. No pleural effusion or pneumothorax.

Bony thorax is demineralized but intact.
IMPRESSION: 1. No acute cardiopulmonary disease.
2. Large hiatal hernia.

## 2016-01-29 DIAGNOSIS — E785 Hyperlipidemia, unspecified: Secondary | ICD-10-CM | POA: Diagnosis not present

## 2016-01-29 DIAGNOSIS — N189 Chronic kidney disease, unspecified: Secondary | ICD-10-CM | POA: Diagnosis not present

## 2016-01-29 DIAGNOSIS — N400 Enlarged prostate without lower urinary tract symptoms: Secondary | ICD-10-CM | POA: Diagnosis not present

## 2016-01-29 DIAGNOSIS — I129 Hypertensive chronic kidney disease with stage 1 through stage 4 chronic kidney disease, or unspecified chronic kidney disease: Secondary | ICD-10-CM | POA: Diagnosis not present

## 2016-01-29 DIAGNOSIS — I480 Paroxysmal atrial fibrillation: Secondary | ICD-10-CM | POA: Diagnosis not present

## 2016-02-10 DIAGNOSIS — H524 Presbyopia: Secondary | ICD-10-CM | POA: Diagnosis not present

## 2016-02-18 DIAGNOSIS — H43813 Vitreous degeneration, bilateral: Secondary | ICD-10-CM | POA: Diagnosis not present

## 2016-02-18 DIAGNOSIS — H3561 Retinal hemorrhage, right eye: Secondary | ICD-10-CM | POA: Diagnosis not present

## 2016-02-18 DIAGNOSIS — H34831 Tributary (branch) retinal vein occlusion, right eye, with macular edema: Secondary | ICD-10-CM | POA: Diagnosis not present

## 2016-02-18 DIAGNOSIS — H35371 Puckering of macula, right eye: Secondary | ICD-10-CM | POA: Diagnosis not present

## 2016-02-23 DIAGNOSIS — H35371 Puckering of macula, right eye: Secondary | ICD-10-CM | POA: Diagnosis not present

## 2016-02-23 DIAGNOSIS — H34831 Tributary (branch) retinal vein occlusion, right eye, with macular edema: Secondary | ICD-10-CM | POA: Diagnosis not present

## 2016-02-23 DIAGNOSIS — H3561 Retinal hemorrhage, right eye: Secondary | ICD-10-CM | POA: Diagnosis not present

## 2016-02-23 DIAGNOSIS — H35351 Cystoid macular degeneration, right eye: Secondary | ICD-10-CM | POA: Diagnosis not present

## 2016-02-26 DIAGNOSIS — D649 Anemia, unspecified: Secondary | ICD-10-CM | POA: Diagnosis not present

## 2016-03-02 DIAGNOSIS — H34831 Tributary (branch) retinal vein occlusion, right eye, with macular edema: Secondary | ICD-10-CM | POA: Diagnosis not present

## 2016-03-02 DIAGNOSIS — H35371 Puckering of macula, right eye: Secondary | ICD-10-CM | POA: Diagnosis not present

## 2016-03-03 DIAGNOSIS — N401 Enlarged prostate with lower urinary tract symptoms: Secondary | ICD-10-CM | POA: Diagnosis not present

## 2016-03-03 DIAGNOSIS — R310 Gross hematuria: Secondary | ICD-10-CM | POA: Diagnosis not present

## 2016-03-03 DIAGNOSIS — N3946 Mixed incontinence: Secondary | ICD-10-CM | POA: Diagnosis not present

## 2016-03-04 DIAGNOSIS — N400 Enlarged prostate without lower urinary tract symptoms: Secondary | ICD-10-CM | POA: Diagnosis not present

## 2016-03-04 DIAGNOSIS — I4891 Unspecified atrial fibrillation: Secondary | ICD-10-CM | POA: Diagnosis not present

## 2016-03-04 DIAGNOSIS — R739 Hyperglycemia, unspecified: Secondary | ICD-10-CM | POA: Diagnosis not present

## 2016-03-04 DIAGNOSIS — Z23 Encounter for immunization: Secondary | ICD-10-CM | POA: Diagnosis not present

## 2016-03-04 DIAGNOSIS — I10 Essential (primary) hypertension: Secondary | ICD-10-CM | POA: Diagnosis not present

## 2016-03-26 DIAGNOSIS — H3581 Retinal edema: Secondary | ICD-10-CM | POA: Diagnosis not present

## 2016-03-26 DIAGNOSIS — H34831 Tributary (branch) retinal vein occlusion, right eye, with macular edema: Secondary | ICD-10-CM | POA: Diagnosis not present

## 2016-03-26 DIAGNOSIS — H35371 Puckering of macula, right eye: Secondary | ICD-10-CM | POA: Diagnosis not present

## 2016-04-29 DIAGNOSIS — I480 Paroxysmal atrial fibrillation: Secondary | ICD-10-CM | POA: Diagnosis not present

## 2016-04-29 DIAGNOSIS — E785 Hyperlipidemia, unspecified: Secondary | ICD-10-CM | POA: Diagnosis not present

## 2016-04-29 DIAGNOSIS — N189 Chronic kidney disease, unspecified: Secondary | ICD-10-CM | POA: Diagnosis not present

## 2016-04-29 DIAGNOSIS — N400 Enlarged prostate without lower urinary tract symptoms: Secondary | ICD-10-CM | POA: Diagnosis not present

## 2016-04-29 DIAGNOSIS — I129 Hypertensive chronic kidney disease with stage 1 through stage 4 chronic kidney disease, or unspecified chronic kidney disease: Secondary | ICD-10-CM | POA: Diagnosis not present

## 2016-06-04 DIAGNOSIS — H43812 Vitreous degeneration, left eye: Secondary | ICD-10-CM | POA: Diagnosis not present

## 2016-06-04 DIAGNOSIS — H34831 Tributary (branch) retinal vein occlusion, right eye, with macular edema: Secondary | ICD-10-CM | POA: Diagnosis not present

## 2016-06-07 DIAGNOSIS — I10 Essential (primary) hypertension: Secondary | ICD-10-CM | POA: Diagnosis not present

## 2016-06-07 DIAGNOSIS — N184 Chronic kidney disease, stage 4 (severe): Secondary | ICD-10-CM | POA: Diagnosis not present

## 2016-06-07 DIAGNOSIS — N320 Bladder-neck obstruction: Secondary | ICD-10-CM | POA: Diagnosis not present

## 2016-06-07 DIAGNOSIS — N2581 Secondary hyperparathyroidism of renal origin: Secondary | ICD-10-CM | POA: Diagnosis not present

## 2016-06-07 DIAGNOSIS — R300 Dysuria: Secondary | ICD-10-CM | POA: Diagnosis not present

## 2016-06-11 DIAGNOSIS — H34831 Tributary (branch) retinal vein occlusion, right eye, with macular edema: Secondary | ICD-10-CM | POA: Diagnosis not present

## 2016-07-01 DIAGNOSIS — I10 Essential (primary) hypertension: Secondary | ICD-10-CM | POA: Diagnosis not present

## 2016-07-01 DIAGNOSIS — R739 Hyperglycemia, unspecified: Secondary | ICD-10-CM | POA: Diagnosis not present

## 2016-07-01 DIAGNOSIS — I4891 Unspecified atrial fibrillation: Secondary | ICD-10-CM | POA: Diagnosis not present

## 2016-07-08 DIAGNOSIS — N183 Chronic kidney disease, stage 3 (moderate): Secondary | ICD-10-CM | POA: Diagnosis not present

## 2016-07-08 DIAGNOSIS — I4891 Unspecified atrial fibrillation: Secondary | ICD-10-CM | POA: Diagnosis not present

## 2016-07-08 DIAGNOSIS — R739 Hyperglycemia, unspecified: Secondary | ICD-10-CM | POA: Diagnosis not present

## 2016-07-14 DIAGNOSIS — H34831 Tributary (branch) retinal vein occlusion, right eye, with macular edema: Secondary | ICD-10-CM | POA: Diagnosis not present

## 2016-07-14 DIAGNOSIS — H43812 Vitreous degeneration, left eye: Secondary | ICD-10-CM | POA: Diagnosis not present

## 2016-07-14 DIAGNOSIS — H34232 Retinal artery branch occlusion, left eye: Secondary | ICD-10-CM | POA: Diagnosis not present

## 2016-07-16 DIAGNOSIS — I4891 Unspecified atrial fibrillation: Secondary | ICD-10-CM | POA: Diagnosis not present

## 2016-07-16 DIAGNOSIS — R739 Hyperglycemia, unspecified: Secondary | ICD-10-CM | POA: Diagnosis not present

## 2016-07-20 ENCOUNTER — Other Ambulatory Visit: Payer: Self-pay | Admitting: Internal Medicine

## 2016-07-20 DIAGNOSIS — H348392 Tributary (branch) retinal vein occlusion, unspecified eye, stable: Secondary | ICD-10-CM

## 2016-07-27 ENCOUNTER — Ambulatory Visit
Admission: RE | Admit: 2016-07-27 | Discharge: 2016-07-27 | Disposition: A | Discharge status: Home / Self Care | Payer: Medicare Other | Source: Ambulatory Visit | Attending: Internal Medicine | Admitting: Internal Medicine

## 2016-07-27 DIAGNOSIS — H348392 Tributary (branch) retinal vein occlusion, unspecified eye, stable: Secondary | ICD-10-CM

## 2016-07-27 DIAGNOSIS — I6523 Occlusion and stenosis of bilateral carotid arteries: Secondary | ICD-10-CM | POA: Diagnosis not present

## 2016-07-30 DIAGNOSIS — I4891 Unspecified atrial fibrillation: Secondary | ICD-10-CM | POA: Diagnosis not present

## 2016-07-30 DIAGNOSIS — E785 Hyperlipidemia, unspecified: Secondary | ICD-10-CM | POA: Diagnosis not present

## 2016-07-30 DIAGNOSIS — I6522 Occlusion and stenosis of left carotid artery: Secondary | ICD-10-CM | POA: Diagnosis not present

## 2016-08-16 DIAGNOSIS — I129 Hypertensive chronic kidney disease with stage 1 through stage 4 chronic kidney disease, or unspecified chronic kidney disease: Secondary | ICD-10-CM | POA: Diagnosis not present

## 2016-08-16 DIAGNOSIS — N400 Enlarged prostate without lower urinary tract symptoms: Secondary | ICD-10-CM | POA: Diagnosis not present

## 2016-08-16 DIAGNOSIS — I480 Paroxysmal atrial fibrillation: Secondary | ICD-10-CM | POA: Diagnosis not present

## 2016-08-16 DIAGNOSIS — N189 Chronic kidney disease, unspecified: Secondary | ICD-10-CM | POA: Diagnosis not present

## 2016-08-16 DIAGNOSIS — E785 Hyperlipidemia, unspecified: Secondary | ICD-10-CM | POA: Diagnosis not present

## 2016-08-25 DIAGNOSIS — H43812 Vitreous degeneration, left eye: Secondary | ICD-10-CM | POA: Diagnosis not present

## 2016-08-25 DIAGNOSIS — H34831 Tributary (branch) retinal vein occlusion, right eye, with macular edema: Secondary | ICD-10-CM | POA: Diagnosis not present

## 2016-08-25 DIAGNOSIS — H34232 Retinal artery branch occlusion, left eye: Secondary | ICD-10-CM | POA: Diagnosis not present

## 2016-09-30 DIAGNOSIS — E785 Hyperlipidemia, unspecified: Secondary | ICD-10-CM | POA: Diagnosis not present

## 2016-10-12 DIAGNOSIS — I10 Essential (primary) hypertension: Secondary | ICD-10-CM | POA: Diagnosis not present

## 2016-10-12 DIAGNOSIS — N183 Chronic kidney disease, stage 3 (moderate): Secondary | ICD-10-CM | POA: Diagnosis not present

## 2016-10-12 DIAGNOSIS — R739 Hyperglycemia, unspecified: Secondary | ICD-10-CM | POA: Diagnosis not present

## 2016-10-12 DIAGNOSIS — I4891 Unspecified atrial fibrillation: Secondary | ICD-10-CM | POA: Diagnosis not present

## 2016-10-29 DIAGNOSIS — H35372 Puckering of macula, left eye: Secondary | ICD-10-CM | POA: Diagnosis not present

## 2016-10-29 DIAGNOSIS — H34831 Tributary (branch) retinal vein occlusion, right eye, with macular edema: Secondary | ICD-10-CM | POA: Diagnosis not present

## 2016-10-29 DIAGNOSIS — H43812 Vitreous degeneration, left eye: Secondary | ICD-10-CM | POA: Diagnosis not present

## 2016-11-12 DIAGNOSIS — H34831 Tributary (branch) retinal vein occlusion, right eye, with macular edema: Secondary | ICD-10-CM | POA: Diagnosis not present

## 2016-11-15 DIAGNOSIS — I129 Hypertensive chronic kidney disease with stage 1 through stage 4 chronic kidney disease, or unspecified chronic kidney disease: Secondary | ICD-10-CM | POA: Diagnosis not present

## 2016-11-15 DIAGNOSIS — E785 Hyperlipidemia, unspecified: Secondary | ICD-10-CM | POA: Diagnosis not present

## 2016-11-15 DIAGNOSIS — N400 Enlarged prostate without lower urinary tract symptoms: Secondary | ICD-10-CM | POA: Diagnosis not present

## 2016-11-15 DIAGNOSIS — I480 Paroxysmal atrial fibrillation: Secondary | ICD-10-CM | POA: Diagnosis not present

## 2016-11-15 DIAGNOSIS — N189 Chronic kidney disease, unspecified: Secondary | ICD-10-CM | POA: Diagnosis not present

## 2016-11-25 DIAGNOSIS — N184 Chronic kidney disease, stage 4 (severe): Secondary | ICD-10-CM | POA: Diagnosis not present

## 2016-12-06 DIAGNOSIS — N320 Bladder-neck obstruction: Secondary | ICD-10-CM | POA: Diagnosis not present

## 2016-12-06 DIAGNOSIS — N2581 Secondary hyperparathyroidism of renal origin: Secondary | ICD-10-CM | POA: Diagnosis not present

## 2016-12-06 DIAGNOSIS — N184 Chronic kidney disease, stage 4 (severe): Secondary | ICD-10-CM | POA: Diagnosis not present

## 2016-12-06 DIAGNOSIS — I10 Essential (primary) hypertension: Secondary | ICD-10-CM | POA: Diagnosis not present

## 2017-01-07 DIAGNOSIS — H35372 Puckering of macula, left eye: Secondary | ICD-10-CM | POA: Diagnosis not present

## 2017-01-07 DIAGNOSIS — H34831 Tributary (branch) retinal vein occlusion, right eye, with macular edema: Secondary | ICD-10-CM | POA: Diagnosis not present

## 2017-01-07 DIAGNOSIS — H34232 Retinal artery branch occlusion, left eye: Secondary | ICD-10-CM | POA: Diagnosis not present

## 2017-01-07 DIAGNOSIS — H43812 Vitreous degeneration, left eye: Secondary | ICD-10-CM | POA: Diagnosis not present

## 2017-02-15 DIAGNOSIS — E785 Hyperlipidemia, unspecified: Secondary | ICD-10-CM | POA: Diagnosis not present

## 2017-02-15 DIAGNOSIS — I480 Paroxysmal atrial fibrillation: Secondary | ICD-10-CM | POA: Diagnosis not present

## 2017-02-15 DIAGNOSIS — N400 Enlarged prostate without lower urinary tract symptoms: Secondary | ICD-10-CM | POA: Diagnosis not present

## 2017-02-15 DIAGNOSIS — I10 Essential (primary) hypertension: Secondary | ICD-10-CM | POA: Diagnosis not present

## 2017-02-16 DIAGNOSIS — R739 Hyperglycemia, unspecified: Secondary | ICD-10-CM | POA: Diagnosis not present

## 2017-02-16 DIAGNOSIS — E785 Hyperlipidemia, unspecified: Secondary | ICD-10-CM | POA: Diagnosis not present

## 2017-02-16 DIAGNOSIS — D649 Anemia, unspecified: Secondary | ICD-10-CM | POA: Diagnosis not present

## 2017-02-16 DIAGNOSIS — I10 Essential (primary) hypertension: Secondary | ICD-10-CM | POA: Diagnosis not present

## 2017-02-21 DIAGNOSIS — I4891 Unspecified atrial fibrillation: Secondary | ICD-10-CM | POA: Diagnosis not present

## 2017-02-21 DIAGNOSIS — I10 Essential (primary) hypertension: Secondary | ICD-10-CM | POA: Diagnosis not present

## 2017-02-21 DIAGNOSIS — Z0000 Encounter for general adult medical examination without abnormal findings: Secondary | ICD-10-CM | POA: Diagnosis not present

## 2017-02-21 DIAGNOSIS — R972 Elevated prostate specific antigen [PSA]: Secondary | ICD-10-CM | POA: Diagnosis not present

## 2017-03-04 DIAGNOSIS — H43812 Vitreous degeneration, left eye: Secondary | ICD-10-CM | POA: Diagnosis not present

## 2017-03-04 DIAGNOSIS — H35372 Puckering of macula, left eye: Secondary | ICD-10-CM | POA: Diagnosis not present

## 2017-03-04 DIAGNOSIS — H34831 Tributary (branch) retinal vein occlusion, right eye, with macular edema: Secondary | ICD-10-CM | POA: Diagnosis not present

## 2017-03-04 DIAGNOSIS — H34232 Retinal artery branch occlusion, left eye: Secondary | ICD-10-CM | POA: Diagnosis not present

## 2017-03-15 DIAGNOSIS — H2511 Age-related nuclear cataract, right eye: Secondary | ICD-10-CM | POA: Diagnosis not present

## 2017-03-15 DIAGNOSIS — H25011 Cortical age-related cataract, right eye: Secondary | ICD-10-CM | POA: Diagnosis not present

## 2017-03-15 DIAGNOSIS — H2513 Age-related nuclear cataract, bilateral: Secondary | ICD-10-CM | POA: Diagnosis not present

## 2017-03-15 DIAGNOSIS — H34831 Tributary (branch) retinal vein occlusion, right eye, with macular edema: Secondary | ICD-10-CM | POA: Diagnosis not present

## 2017-03-15 DIAGNOSIS — H2512 Age-related nuclear cataract, left eye: Secondary | ICD-10-CM | POA: Diagnosis not present

## 2017-03-15 DIAGNOSIS — H25041 Posterior subcapsular polar age-related cataract, right eye: Secondary | ICD-10-CM | POA: Diagnosis not present

## 2017-04-04 DIAGNOSIS — H2511 Age-related nuclear cataract, right eye: Secondary | ICD-10-CM | POA: Diagnosis not present

## 2017-04-11 DIAGNOSIS — H2513 Age-related nuclear cataract, bilateral: Secondary | ICD-10-CM | POA: Diagnosis not present

## 2017-05-03 DIAGNOSIS — H35372 Puckering of macula, left eye: Secondary | ICD-10-CM | POA: Diagnosis not present

## 2017-05-03 DIAGNOSIS — H34831 Tributary (branch) retinal vein occlusion, right eye, with macular edema: Secondary | ICD-10-CM | POA: Diagnosis not present

## 2017-05-03 DIAGNOSIS — H34232 Retinal artery branch occlusion, left eye: Secondary | ICD-10-CM | POA: Diagnosis not present

## 2017-05-03 DIAGNOSIS — H34813 Central retinal vein occlusion, bilateral, with macular edema: Secondary | ICD-10-CM | POA: Diagnosis not present

## 2017-05-03 DIAGNOSIS — H43812 Vitreous degeneration, left eye: Secondary | ICD-10-CM | POA: Diagnosis not present

## 2017-05-16 DIAGNOSIS — I480 Paroxysmal atrial fibrillation: Secondary | ICD-10-CM | POA: Diagnosis not present

## 2017-05-16 DIAGNOSIS — E785 Hyperlipidemia, unspecified: Secondary | ICD-10-CM | POA: Diagnosis not present

## 2017-05-16 DIAGNOSIS — N400 Enlarged prostate without lower urinary tract symptoms: Secondary | ICD-10-CM | POA: Diagnosis not present

## 2017-05-16 DIAGNOSIS — I10 Essential (primary) hypertension: Secondary | ICD-10-CM | POA: Diagnosis not present

## 2017-06-01 DIAGNOSIS — H2513 Age-related nuclear cataract, bilateral: Secondary | ICD-10-CM | POA: Diagnosis not present

## 2017-06-16 DIAGNOSIS — I10 Essential (primary) hypertension: Secondary | ICD-10-CM | POA: Diagnosis not present

## 2017-06-16 DIAGNOSIS — Z125 Encounter for screening for malignant neoplasm of prostate: Secondary | ICD-10-CM | POA: Diagnosis not present

## 2017-06-16 DIAGNOSIS — I4891 Unspecified atrial fibrillation: Secondary | ICD-10-CM | POA: Diagnosis not present

## 2017-06-16 DIAGNOSIS — N183 Chronic kidney disease, stage 3 (moderate): Secondary | ICD-10-CM | POA: Diagnosis not present

## 2017-06-16 DIAGNOSIS — R739 Hyperglycemia, unspecified: Secondary | ICD-10-CM | POA: Diagnosis not present

## 2017-06-23 DIAGNOSIS — E119 Type 2 diabetes mellitus without complications: Secondary | ICD-10-CM | POA: Diagnosis not present

## 2017-07-12 DIAGNOSIS — H35372 Puckering of macula, left eye: Secondary | ICD-10-CM | POA: Diagnosis not present

## 2017-07-12 DIAGNOSIS — H43812 Vitreous degeneration, left eye: Secondary | ICD-10-CM | POA: Diagnosis not present

## 2017-07-12 DIAGNOSIS — H34232 Retinal artery branch occlusion, left eye: Secondary | ICD-10-CM | POA: Diagnosis not present

## 2017-07-12 DIAGNOSIS — H34831 Tributary (branch) retinal vein occlusion, right eye, with macular edema: Secondary | ICD-10-CM | POA: Diagnosis not present

## 2017-08-15 DIAGNOSIS — N400 Enlarged prostate without lower urinary tract symptoms: Secondary | ICD-10-CM | POA: Diagnosis not present

## 2017-08-15 DIAGNOSIS — I480 Paroxysmal atrial fibrillation: Secondary | ICD-10-CM | POA: Diagnosis not present

## 2017-08-15 DIAGNOSIS — E785 Hyperlipidemia, unspecified: Secondary | ICD-10-CM | POA: Diagnosis not present

## 2017-08-15 DIAGNOSIS — I10 Essential (primary) hypertension: Secondary | ICD-10-CM | POA: Diagnosis not present

## 2017-09-06 DIAGNOSIS — H34232 Retinal artery branch occlusion, left eye: Secondary | ICD-10-CM | POA: Diagnosis not present

## 2017-09-06 DIAGNOSIS — H34831 Tributary (branch) retinal vein occlusion, right eye, with macular edema: Secondary | ICD-10-CM | POA: Diagnosis not present

## 2017-09-06 DIAGNOSIS — H43812 Vitreous degeneration, left eye: Secondary | ICD-10-CM | POA: Diagnosis not present

## 2017-09-21 DIAGNOSIS — E119 Type 2 diabetes mellitus without complications: Secondary | ICD-10-CM | POA: Diagnosis not present

## 2017-09-27 DIAGNOSIS — I10 Essential (primary) hypertension: Secondary | ICD-10-CM | POA: Diagnosis not present

## 2017-09-27 DIAGNOSIS — B353 Tinea pedis: Secondary | ICD-10-CM | POA: Diagnosis not present

## 2017-09-27 DIAGNOSIS — E119 Type 2 diabetes mellitus without complications: Secondary | ICD-10-CM | POA: Diagnosis not present

## 2017-10-12 DIAGNOSIS — I480 Paroxysmal atrial fibrillation: Secondary | ICD-10-CM | POA: Diagnosis not present

## 2017-10-12 DIAGNOSIS — I5030 Unspecified diastolic (congestive) heart failure: Secondary | ICD-10-CM | POA: Diagnosis not present

## 2017-10-12 DIAGNOSIS — E785 Hyperlipidemia, unspecified: Secondary | ICD-10-CM | POA: Diagnosis not present

## 2017-10-12 DIAGNOSIS — N189 Chronic kidney disease, unspecified: Secondary | ICD-10-CM | POA: Diagnosis not present

## 2017-10-12 DIAGNOSIS — I1310 Hypertensive heart and chronic kidney disease without heart failure, with stage 1 through stage 4 chronic kidney disease, or unspecified chronic kidney disease: Secondary | ICD-10-CM | POA: Diagnosis not present

## 2017-10-26 DIAGNOSIS — N189 Chronic kidney disease, unspecified: Secondary | ICD-10-CM | POA: Diagnosis not present

## 2017-10-26 DIAGNOSIS — I1310 Hypertensive heart and chronic kidney disease without heart failure, with stage 1 through stage 4 chronic kidney disease, or unspecified chronic kidney disease: Secondary | ICD-10-CM | POA: Diagnosis not present

## 2017-10-26 DIAGNOSIS — E785 Hyperlipidemia, unspecified: Secondary | ICD-10-CM | POA: Diagnosis not present

## 2017-10-26 DIAGNOSIS — I480 Paroxysmal atrial fibrillation: Secondary | ICD-10-CM | POA: Diagnosis not present

## 2017-10-26 DIAGNOSIS — I5030 Unspecified diastolic (congestive) heart failure: Secondary | ICD-10-CM | POA: Diagnosis not present

## 2017-10-28 DIAGNOSIS — H43812 Vitreous degeneration, left eye: Secondary | ICD-10-CM | POA: Diagnosis not present

## 2017-10-28 DIAGNOSIS — H34212 Partial retinal artery occlusion, left eye: Secondary | ICD-10-CM | POA: Diagnosis not present

## 2017-10-28 DIAGNOSIS — H34831 Tributary (branch) retinal vein occlusion, right eye, with macular edema: Secondary | ICD-10-CM | POA: Diagnosis not present

## 2017-10-28 DIAGNOSIS — H35372 Puckering of macula, left eye: Secondary | ICD-10-CM | POA: Diagnosis not present

## 2017-11-08 DIAGNOSIS — I480 Paroxysmal atrial fibrillation: Secondary | ICD-10-CM | POA: Diagnosis not present

## 2017-11-08 DIAGNOSIS — E785 Hyperlipidemia, unspecified: Secondary | ICD-10-CM | POA: Diagnosis not present

## 2017-11-08 DIAGNOSIS — I10 Essential (primary) hypertension: Secondary | ICD-10-CM | POA: Diagnosis not present

## 2017-11-14 DIAGNOSIS — I1310 Hypertensive heart and chronic kidney disease without heart failure, with stage 1 through stage 4 chronic kidney disease, or unspecified chronic kidney disease: Secondary | ICD-10-CM | POA: Diagnosis not present

## 2017-11-14 DIAGNOSIS — I480 Paroxysmal atrial fibrillation: Secondary | ICD-10-CM | POA: Diagnosis not present

## 2017-11-14 DIAGNOSIS — E785 Hyperlipidemia, unspecified: Secondary | ICD-10-CM | POA: Diagnosis not present

## 2017-11-14 DIAGNOSIS — N189 Chronic kidney disease, unspecified: Secondary | ICD-10-CM | POA: Diagnosis not present

## 2017-11-14 DIAGNOSIS — I509 Heart failure, unspecified: Secondary | ICD-10-CM | POA: Diagnosis not present

## 2017-12-20 DIAGNOSIS — E119 Type 2 diabetes mellitus without complications: Secondary | ICD-10-CM | POA: Diagnosis not present

## 2017-12-20 DIAGNOSIS — I10 Essential (primary) hypertension: Secondary | ICD-10-CM | POA: Diagnosis not present

## 2017-12-23 DIAGNOSIS — H35372 Puckering of macula, left eye: Secondary | ICD-10-CM | POA: Diagnosis not present

## 2017-12-23 DIAGNOSIS — H34831 Tributary (branch) retinal vein occlusion, right eye, with macular edema: Secondary | ICD-10-CM | POA: Diagnosis not present

## 2017-12-23 DIAGNOSIS — H34232 Retinal artery branch occlusion, left eye: Secondary | ICD-10-CM | POA: Diagnosis not present

## 2017-12-23 DIAGNOSIS — H43812 Vitreous degeneration, left eye: Secondary | ICD-10-CM | POA: Diagnosis not present

## 2017-12-26 DIAGNOSIS — N289 Disorder of kidney and ureter, unspecified: Secondary | ICD-10-CM | POA: Diagnosis not present

## 2017-12-26 DIAGNOSIS — R739 Hyperglycemia, unspecified: Secondary | ICD-10-CM | POA: Diagnosis not present

## 2017-12-26 DIAGNOSIS — E7800 Pure hypercholesterolemia, unspecified: Secondary | ICD-10-CM | POA: Diagnosis not present

## 2017-12-26 DIAGNOSIS — Z79899 Other long term (current) drug therapy: Secondary | ICD-10-CM | POA: Diagnosis not present

## 2017-12-26 DIAGNOSIS — N179 Acute kidney failure, unspecified: Secondary | ICD-10-CM | POA: Diagnosis not present

## 2017-12-26 DIAGNOSIS — I10 Essential (primary) hypertension: Secondary | ICD-10-CM | POA: Diagnosis not present

## 2018-01-19 DIAGNOSIS — I129 Hypertensive chronic kidney disease with stage 1 through stage 4 chronic kidney disease, or unspecified chronic kidney disease: Secondary | ICD-10-CM | POA: Diagnosis not present

## 2018-01-19 DIAGNOSIS — N2581 Secondary hyperparathyroidism of renal origin: Secondary | ICD-10-CM | POA: Diagnosis not present

## 2018-01-19 DIAGNOSIS — N320 Bladder-neck obstruction: Secondary | ICD-10-CM | POA: Diagnosis not present

## 2018-01-19 DIAGNOSIS — N183 Chronic kidney disease, stage 3 (moderate): Secondary | ICD-10-CM | POA: Diagnosis not present

## 2018-01-27 DIAGNOSIS — N183 Chronic kidney disease, stage 3 (moderate): Secondary | ICD-10-CM | POA: Diagnosis not present

## 2018-01-27 DIAGNOSIS — I10 Essential (primary) hypertension: Secondary | ICD-10-CM | POA: Diagnosis not present

## 2018-02-03 DIAGNOSIS — I10 Essential (primary) hypertension: Secondary | ICD-10-CM | POA: Diagnosis not present

## 2018-02-03 DIAGNOSIS — N183 Chronic kidney disease, stage 3 (moderate): Secondary | ICD-10-CM | POA: Diagnosis not present

## 2018-02-14 DIAGNOSIS — N189 Chronic kidney disease, unspecified: Secondary | ICD-10-CM | POA: Diagnosis not present

## 2018-02-14 DIAGNOSIS — R001 Bradycardia, unspecified: Secondary | ICD-10-CM | POA: Diagnosis not present

## 2018-02-14 DIAGNOSIS — I480 Paroxysmal atrial fibrillation: Secondary | ICD-10-CM | POA: Diagnosis not present

## 2018-02-14 DIAGNOSIS — I5030 Unspecified diastolic (congestive) heart failure: Secondary | ICD-10-CM | POA: Diagnosis not present

## 2018-02-14 DIAGNOSIS — I1310 Hypertensive heart and chronic kidney disease without heart failure, with stage 1 through stage 4 chronic kidney disease, or unspecified chronic kidney disease: Secondary | ICD-10-CM | POA: Diagnosis not present

## 2018-02-17 DIAGNOSIS — H2512 Age-related nuclear cataract, left eye: Secondary | ICD-10-CM | POA: Diagnosis not present

## 2018-02-17 DIAGNOSIS — H35372 Puckering of macula, left eye: Secondary | ICD-10-CM | POA: Diagnosis not present

## 2018-02-17 DIAGNOSIS — H34831 Tributary (branch) retinal vein occlusion, right eye, with macular edema: Secondary | ICD-10-CM | POA: Diagnosis not present

## 2018-02-17 DIAGNOSIS — H34212 Partial retinal artery occlusion, left eye: Secondary | ICD-10-CM | POA: Diagnosis not present

## 2018-02-24 DIAGNOSIS — N183 Chronic kidney disease, stage 3 (moderate): Secondary | ICD-10-CM | POA: Diagnosis not present

## 2018-03-02 DIAGNOSIS — R739 Hyperglycemia, unspecified: Secondary | ICD-10-CM | POA: Diagnosis not present

## 2018-03-02 DIAGNOSIS — R972 Elevated prostate specific antigen [PSA]: Secondary | ICD-10-CM | POA: Diagnosis not present

## 2018-03-02 DIAGNOSIS — N183 Chronic kidney disease, stage 3 (moderate): Secondary | ICD-10-CM | POA: Diagnosis not present

## 2018-03-02 DIAGNOSIS — I4891 Unspecified atrial fibrillation: Secondary | ICD-10-CM | POA: Diagnosis not present

## 2018-03-02 DIAGNOSIS — I10 Essential (primary) hypertension: Secondary | ICD-10-CM | POA: Diagnosis not present

## 2018-03-16 DIAGNOSIS — R001 Bradycardia, unspecified: Secondary | ICD-10-CM | POA: Diagnosis not present

## 2018-03-16 DIAGNOSIS — I5030 Unspecified diastolic (congestive) heart failure: Secondary | ICD-10-CM | POA: Diagnosis not present

## 2018-03-16 DIAGNOSIS — I480 Paroxysmal atrial fibrillation: Secondary | ICD-10-CM | POA: Diagnosis not present

## 2018-03-16 DIAGNOSIS — I10 Essential (primary) hypertension: Secondary | ICD-10-CM | POA: Diagnosis not present

## 2018-03-16 DIAGNOSIS — E785 Hyperlipidemia, unspecified: Secondary | ICD-10-CM | POA: Diagnosis not present

## 2018-04-14 DIAGNOSIS — H35372 Puckering of macula, left eye: Secondary | ICD-10-CM | POA: Diagnosis not present

## 2018-04-14 DIAGNOSIS — H34212 Partial retinal artery occlusion, left eye: Secondary | ICD-10-CM | POA: Diagnosis not present

## 2018-04-14 DIAGNOSIS — H43812 Vitreous degeneration, left eye: Secondary | ICD-10-CM | POA: Diagnosis not present

## 2018-04-14 DIAGNOSIS — H34831 Tributary (branch) retinal vein occlusion, right eye, with macular edema: Secondary | ICD-10-CM | POA: Diagnosis not present

## 2018-04-18 DIAGNOSIS — I480 Paroxysmal atrial fibrillation: Secondary | ICD-10-CM | POA: Diagnosis not present

## 2018-04-18 DIAGNOSIS — I10 Essential (primary) hypertension: Secondary | ICD-10-CM | POA: Diagnosis not present

## 2018-04-18 DIAGNOSIS — I5030 Unspecified diastolic (congestive) heart failure: Secondary | ICD-10-CM | POA: Diagnosis not present

## 2018-04-18 DIAGNOSIS — N189 Chronic kidney disease, unspecified: Secondary | ICD-10-CM | POA: Diagnosis not present

## 2018-04-18 DIAGNOSIS — E785 Hyperlipidemia, unspecified: Secondary | ICD-10-CM | POA: Diagnosis not present

## 2018-04-20 DIAGNOSIS — I129 Hypertensive chronic kidney disease with stage 1 through stage 4 chronic kidney disease, or unspecified chronic kidney disease: Secondary | ICD-10-CM | POA: Diagnosis not present

## 2018-04-20 DIAGNOSIS — N2581 Secondary hyperparathyroidism of renal origin: Secondary | ICD-10-CM | POA: Diagnosis not present

## 2018-04-20 DIAGNOSIS — R7309 Other abnormal glucose: Secondary | ICD-10-CM | POA: Diagnosis not present

## 2018-04-20 DIAGNOSIS — N320 Bladder-neck obstruction: Secondary | ICD-10-CM | POA: Diagnosis not present

## 2018-04-20 DIAGNOSIS — N183 Chronic kidney disease, stage 3 (moderate): Secondary | ICD-10-CM | POA: Diagnosis not present

## 2018-04-20 DIAGNOSIS — I10 Essential (primary) hypertension: Secondary | ICD-10-CM | POA: Diagnosis not present

## 2018-05-16 DIAGNOSIS — Z961 Presence of intraocular lens: Secondary | ICD-10-CM | POA: Diagnosis not present

## 2018-05-16 DIAGNOSIS — H2512 Age-related nuclear cataract, left eye: Secondary | ICD-10-CM | POA: Diagnosis not present

## 2018-05-16 DIAGNOSIS — H18413 Arcus senilis, bilateral: Secondary | ICD-10-CM | POA: Diagnosis not present

## 2018-05-16 DIAGNOSIS — H02831 Dermatochalasis of right upper eyelid: Secondary | ICD-10-CM | POA: Diagnosis not present

## 2018-05-16 DIAGNOSIS — H35373 Puckering of macula, bilateral: Secondary | ICD-10-CM | POA: Diagnosis not present

## 2018-05-29 DIAGNOSIS — H2513 Age-related nuclear cataract, bilateral: Secondary | ICD-10-CM | POA: Diagnosis not present

## 2018-05-29 DIAGNOSIS — H2512 Age-related nuclear cataract, left eye: Secondary | ICD-10-CM | POA: Diagnosis not present

## 2018-06-01 DIAGNOSIS — R739 Hyperglycemia, unspecified: Secondary | ICD-10-CM | POA: Diagnosis not present

## 2018-06-01 DIAGNOSIS — I10 Essential (primary) hypertension: Secondary | ICD-10-CM | POA: Diagnosis not present

## 2018-06-07 DIAGNOSIS — I10 Essential (primary) hypertension: Secondary | ICD-10-CM | POA: Diagnosis not present

## 2018-06-07 DIAGNOSIS — R739 Hyperglycemia, unspecified: Secondary | ICD-10-CM | POA: Diagnosis not present

## 2018-06-07 DIAGNOSIS — E782 Mixed hyperlipidemia: Secondary | ICD-10-CM | POA: Diagnosis not present

## 2018-06-07 DIAGNOSIS — N183 Chronic kidney disease, stage 3 (moderate): Secondary | ICD-10-CM | POA: Diagnosis not present

## 2018-06-23 DIAGNOSIS — H43812 Vitreous degeneration, left eye: Secondary | ICD-10-CM | POA: Diagnosis not present

## 2018-06-23 DIAGNOSIS — H34831 Tributary (branch) retinal vein occlusion, right eye, with macular edema: Secondary | ICD-10-CM | POA: Diagnosis not present

## 2018-06-23 DIAGNOSIS — H35372 Puckering of macula, left eye: Secondary | ICD-10-CM | POA: Diagnosis not present

## 2018-06-23 DIAGNOSIS — H34232 Retinal artery branch occlusion, left eye: Secondary | ICD-10-CM | POA: Diagnosis not present

## 2018-07-13 DIAGNOSIS — I480 Paroxysmal atrial fibrillation: Secondary | ICD-10-CM | POA: Diagnosis not present

## 2018-07-13 DIAGNOSIS — N189 Chronic kidney disease, unspecified: Secondary | ICD-10-CM | POA: Diagnosis not present

## 2018-07-13 DIAGNOSIS — E785 Hyperlipidemia, unspecified: Secondary | ICD-10-CM | POA: Diagnosis not present

## 2018-07-13 DIAGNOSIS — I10 Essential (primary) hypertension: Secondary | ICD-10-CM | POA: Diagnosis not present

## 2018-07-13 DIAGNOSIS — I5030 Unspecified diastolic (congestive) heart failure: Secondary | ICD-10-CM | POA: Diagnosis not present

## 2018-08-03 DIAGNOSIS — N2581 Secondary hyperparathyroidism of renal origin: Secondary | ICD-10-CM | POA: Diagnosis not present

## 2018-08-03 DIAGNOSIS — N183 Chronic kidney disease, stage 3 (moderate): Secondary | ICD-10-CM | POA: Diagnosis not present

## 2018-08-03 DIAGNOSIS — N320 Bladder-neck obstruction: Secondary | ICD-10-CM | POA: Diagnosis not present

## 2018-08-03 DIAGNOSIS — I129 Hypertensive chronic kidney disease with stage 1 through stage 4 chronic kidney disease, or unspecified chronic kidney disease: Secondary | ICD-10-CM | POA: Diagnosis not present

## 2018-09-06 DIAGNOSIS — H34831 Tributary (branch) retinal vein occlusion, right eye, with macular edema: Secondary | ICD-10-CM | POA: Diagnosis not present

## 2018-09-06 DIAGNOSIS — H43812 Vitreous degeneration, left eye: Secondary | ICD-10-CM | POA: Diagnosis not present

## 2018-09-06 DIAGNOSIS — H35372 Puckering of macula, left eye: Secondary | ICD-10-CM | POA: Diagnosis not present

## 2018-09-06 DIAGNOSIS — H34212 Partial retinal artery occlusion, left eye: Secondary | ICD-10-CM | POA: Diagnosis not present

## 2018-10-12 DIAGNOSIS — I480 Paroxysmal atrial fibrillation: Secondary | ICD-10-CM | POA: Diagnosis not present

## 2018-10-12 DIAGNOSIS — I5030 Unspecified diastolic (congestive) heart failure: Secondary | ICD-10-CM | POA: Diagnosis not present

## 2018-10-12 DIAGNOSIS — I10 Essential (primary) hypertension: Secondary | ICD-10-CM | POA: Diagnosis not present

## 2018-10-12 DIAGNOSIS — E785 Hyperlipidemia, unspecified: Secondary | ICD-10-CM | POA: Diagnosis not present

## 2018-10-23 DIAGNOSIS — N2581 Secondary hyperparathyroidism of renal origin: Secondary | ICD-10-CM | POA: Diagnosis not present

## 2018-10-23 DIAGNOSIS — N320 Bladder-neck obstruction: Secondary | ICD-10-CM | POA: Diagnosis not present

## 2018-10-23 DIAGNOSIS — N183 Chronic kidney disease, stage 3 (moderate): Secondary | ICD-10-CM | POA: Diagnosis not present

## 2018-10-23 DIAGNOSIS — I129 Hypertensive chronic kidney disease with stage 1 through stage 4 chronic kidney disease, or unspecified chronic kidney disease: Secondary | ICD-10-CM | POA: Diagnosis not present

## 2018-11-21 DIAGNOSIS — H43812 Vitreous degeneration, left eye: Secondary | ICD-10-CM | POA: Diagnosis not present

## 2018-11-21 DIAGNOSIS — H34232 Retinal artery branch occlusion, left eye: Secondary | ICD-10-CM | POA: Diagnosis not present

## 2018-11-21 DIAGNOSIS — H34831 Tributary (branch) retinal vein occlusion, right eye, with macular edema: Secondary | ICD-10-CM | POA: Diagnosis not present

## 2018-11-21 DIAGNOSIS — H35372 Puckering of macula, left eye: Secondary | ICD-10-CM | POA: Diagnosis not present

## 2018-11-30 DIAGNOSIS — N183 Chronic kidney disease, stage 3 (moderate): Secondary | ICD-10-CM | POA: Diagnosis not present

## 2018-11-30 DIAGNOSIS — E782 Mixed hyperlipidemia: Secondary | ICD-10-CM | POA: Diagnosis not present

## 2018-11-30 DIAGNOSIS — Z0000 Encounter for general adult medical examination without abnormal findings: Secondary | ICD-10-CM | POA: Diagnosis not present

## 2018-11-30 DIAGNOSIS — I10 Essential (primary) hypertension: Secondary | ICD-10-CM | POA: Diagnosis not present

## 2018-11-30 DIAGNOSIS — R739 Hyperglycemia, unspecified: Secondary | ICD-10-CM | POA: Diagnosis not present

## 2019-01-03 DIAGNOSIS — I480 Paroxysmal atrial fibrillation: Secondary | ICD-10-CM | POA: Diagnosis not present

## 2019-01-03 DIAGNOSIS — I10 Essential (primary) hypertension: Secondary | ICD-10-CM | POA: Diagnosis not present

## 2019-01-03 DIAGNOSIS — E785 Hyperlipidemia, unspecified: Secondary | ICD-10-CM | POA: Diagnosis not present

## 2019-01-03 DIAGNOSIS — N189 Chronic kidney disease, unspecified: Secondary | ICD-10-CM | POA: Diagnosis not present

## 2019-01-11 DIAGNOSIS — I482 Chronic atrial fibrillation, unspecified: Secondary | ICD-10-CM | POA: Diagnosis not present

## 2019-01-11 DIAGNOSIS — I4892 Unspecified atrial flutter: Secondary | ICD-10-CM | POA: Diagnosis not present

## 2019-01-11 DIAGNOSIS — I10 Essential (primary) hypertension: Secondary | ICD-10-CM | POA: Diagnosis not present

## 2019-01-11 DIAGNOSIS — N189 Chronic kidney disease, unspecified: Secondary | ICD-10-CM | POA: Diagnosis not present

## 2019-01-11 DIAGNOSIS — I5030 Unspecified diastolic (congestive) heart failure: Secondary | ICD-10-CM | POA: Diagnosis not present

## 2019-01-26 DIAGNOSIS — I10 Essential (primary) hypertension: Secondary | ICD-10-CM | POA: Diagnosis not present

## 2019-01-26 DIAGNOSIS — I4892 Unspecified atrial flutter: Secondary | ICD-10-CM | POA: Diagnosis not present

## 2019-01-26 DIAGNOSIS — N189 Chronic kidney disease, unspecified: Secondary | ICD-10-CM | POA: Diagnosis not present

## 2019-01-26 DIAGNOSIS — I482 Chronic atrial fibrillation, unspecified: Secondary | ICD-10-CM | POA: Diagnosis not present

## 2019-01-26 DIAGNOSIS — I5030 Unspecified diastolic (congestive) heart failure: Secondary | ICD-10-CM | POA: Diagnosis not present

## 2019-02-06 DIAGNOSIS — H34831 Tributary (branch) retinal vein occlusion, right eye, with macular edema: Secondary | ICD-10-CM | POA: Diagnosis not present

## 2019-02-27 DIAGNOSIS — E785 Hyperlipidemia, unspecified: Secondary | ICD-10-CM | POA: Diagnosis not present

## 2019-02-27 DIAGNOSIS — I480 Paroxysmal atrial fibrillation: Secondary | ICD-10-CM | POA: Diagnosis not present

## 2019-02-27 DIAGNOSIS — I5030 Unspecified diastolic (congestive) heart failure: Secondary | ICD-10-CM | POA: Diagnosis not present

## 2019-02-27 DIAGNOSIS — I10 Essential (primary) hypertension: Secondary | ICD-10-CM | POA: Diagnosis not present

## 2019-04-24 DIAGNOSIS — H43812 Vitreous degeneration, left eye: Secondary | ICD-10-CM | POA: Diagnosis not present

## 2019-04-24 DIAGNOSIS — H34831 Tributary (branch) retinal vein occlusion, right eye, with macular edema: Secondary | ICD-10-CM | POA: Diagnosis not present

## 2019-04-24 DIAGNOSIS — H35372 Puckering of macula, left eye: Secondary | ICD-10-CM | POA: Diagnosis not present

## 2019-04-24 DIAGNOSIS — H34232 Retinal artery branch occlusion, left eye: Secondary | ICD-10-CM | POA: Diagnosis not present

## 2019-04-26 DIAGNOSIS — I10 Essential (primary) hypertension: Secondary | ICD-10-CM | POA: Diagnosis not present

## 2019-04-26 DIAGNOSIS — B360 Pityriasis versicolor: Secondary | ICD-10-CM | POA: Diagnosis not present

## 2019-04-26 DIAGNOSIS — N183 Chronic kidney disease, stage 3 (moderate): Secondary | ICD-10-CM | POA: Diagnosis not present

## 2019-04-26 DIAGNOSIS — I4891 Unspecified atrial fibrillation: Secondary | ICD-10-CM | POA: Diagnosis not present

## 2019-04-30 DIAGNOSIS — N189 Chronic kidney disease, unspecified: Secondary | ICD-10-CM | POA: Diagnosis not present

## 2019-04-30 DIAGNOSIS — I10 Essential (primary) hypertension: Secondary | ICD-10-CM | POA: Diagnosis not present

## 2019-04-30 DIAGNOSIS — I5030 Unspecified diastolic (congestive) heart failure: Secondary | ICD-10-CM | POA: Diagnosis not present

## 2019-04-30 DIAGNOSIS — I480 Paroxysmal atrial fibrillation: Secondary | ICD-10-CM | POA: Diagnosis not present

## 2019-05-24 DIAGNOSIS — I4891 Unspecified atrial fibrillation: Secondary | ICD-10-CM | POA: Diagnosis not present

## 2019-05-24 DIAGNOSIS — I10 Essential (primary) hypertension: Secondary | ICD-10-CM | POA: Diagnosis not present

## 2019-05-31 DIAGNOSIS — I4891 Unspecified atrial fibrillation: Secondary | ICD-10-CM | POA: Diagnosis not present

## 2019-05-31 DIAGNOSIS — Z0000 Encounter for general adult medical examination without abnormal findings: Secondary | ICD-10-CM | POA: Diagnosis not present

## 2019-05-31 DIAGNOSIS — E782 Mixed hyperlipidemia: Secondary | ICD-10-CM | POA: Diagnosis not present

## 2019-05-31 DIAGNOSIS — N183 Chronic kidney disease, stage 3 (moderate): Secondary | ICD-10-CM | POA: Diagnosis not present

## 2019-05-31 DIAGNOSIS — I10 Essential (primary) hypertension: Secondary | ICD-10-CM | POA: Diagnosis not present

## 2019-06-05 DIAGNOSIS — N183 Chronic kidney disease, stage 3 (moderate): Secondary | ICD-10-CM | POA: Diagnosis not present

## 2019-06-12 DIAGNOSIS — N183 Chronic kidney disease, stage 3 (moderate): Secondary | ICD-10-CM | POA: Diagnosis not present

## 2019-06-12 DIAGNOSIS — I129 Hypertensive chronic kidney disease with stage 1 through stage 4 chronic kidney disease, or unspecified chronic kidney disease: Secondary | ICD-10-CM | POA: Diagnosis not present

## 2019-06-12 DIAGNOSIS — N320 Bladder-neck obstruction: Secondary | ICD-10-CM | POA: Diagnosis not present

## 2019-06-12 DIAGNOSIS — N2581 Secondary hyperparathyroidism of renal origin: Secondary | ICD-10-CM | POA: Diagnosis not present

## 2019-07-18 DIAGNOSIS — H35372 Puckering of macula, left eye: Secondary | ICD-10-CM | POA: Diagnosis not present

## 2019-07-18 DIAGNOSIS — H43812 Vitreous degeneration, left eye: Secondary | ICD-10-CM | POA: Diagnosis not present

## 2019-07-18 DIAGNOSIS — H34232 Retinal artery branch occlusion, left eye: Secondary | ICD-10-CM | POA: Diagnosis not present

## 2019-07-18 DIAGNOSIS — H34831 Tributary (branch) retinal vein occlusion, right eye, with macular edema: Secondary | ICD-10-CM | POA: Diagnosis not present

## 2019-07-30 DIAGNOSIS — I5030 Unspecified diastolic (congestive) heart failure: Secondary | ICD-10-CM | POA: Diagnosis not present

## 2019-07-30 DIAGNOSIS — I4892 Unspecified atrial flutter: Secondary | ICD-10-CM | POA: Diagnosis not present

## 2019-07-30 DIAGNOSIS — N189 Chronic kidney disease, unspecified: Secondary | ICD-10-CM | POA: Diagnosis not present

## 2019-07-30 DIAGNOSIS — I480 Paroxysmal atrial fibrillation: Secondary | ICD-10-CM | POA: Diagnosis not present

## 2019-10-29 DIAGNOSIS — N189 Chronic kidney disease, unspecified: Secondary | ICD-10-CM | POA: Diagnosis not present

## 2019-10-29 DIAGNOSIS — I10 Essential (primary) hypertension: Secondary | ICD-10-CM | POA: Diagnosis not present

## 2019-10-29 DIAGNOSIS — I4892 Unspecified atrial flutter: Secondary | ICD-10-CM | POA: Diagnosis not present

## 2019-10-29 DIAGNOSIS — I4891 Unspecified atrial fibrillation: Secondary | ICD-10-CM | POA: Diagnosis not present

## 2019-10-29 DIAGNOSIS — I5030 Unspecified diastolic (congestive) heart failure: Secondary | ICD-10-CM | POA: Diagnosis not present

## 2019-11-21 DIAGNOSIS — I4891 Unspecified atrial fibrillation: Secondary | ICD-10-CM | POA: Diagnosis not present

## 2019-11-21 DIAGNOSIS — I10 Essential (primary) hypertension: Secondary | ICD-10-CM | POA: Diagnosis not present

## 2019-12-03 DIAGNOSIS — E119 Type 2 diabetes mellitus without complications: Secondary | ICD-10-CM | POA: Diagnosis not present

## 2019-12-03 DIAGNOSIS — E785 Hyperlipidemia, unspecified: Secondary | ICD-10-CM | POA: Diagnosis not present

## 2019-12-03 DIAGNOSIS — I10 Essential (primary) hypertension: Secondary | ICD-10-CM | POA: Diagnosis not present

## 2019-12-03 DIAGNOSIS — I4891 Unspecified atrial fibrillation: Secondary | ICD-10-CM | POA: Diagnosis not present

## 2020-01-07 ENCOUNTER — Telehealth: Payer: Self-pay

## 2020-01-07 NOTE — Telephone Encounter (Signed)
NOTES ON FILE FROM DR Jeneen Rinks KIM ([place]) [phone], [place]

## 2020-01-10 ENCOUNTER — Ambulatory Visit: Payer: Medicare Other | Attending: Internal Medicine

## 2020-01-10 DIAGNOSIS — Z23 Encounter for immunization: Secondary | ICD-10-CM

## 2020-01-10 NOTE — Progress Notes (Signed)
   [id number]  Name:  Jadrien Narine.    MRN: [id number] DOB: 1940/12/03  01/10/2020  Mr. Akamine was observed post Covid-19 immunization for 15 minutes without incident. He was provided with Vaccine Information Sheet and instruction to access the V-Safe system.   Mr. Hamman was instructed to call 911 with any severe reactions post vaccine: Marland Kitchen Difficulty breathing  . Swelling of face and throat  . A fast heartbeat  . A bad rash all over body  . Dizziness and weakness   Immunizations Administered    Name Date Dose VIS Date Route   Pfizer COVID-19 Vaccine 01/10/2020  3:46 PM 0.3 mL 11/14/2018 Intramuscular   Manufacturer: [place]   Lot: [id number]   [place]: [id number]

## 2020-02-04 ENCOUNTER — Ambulatory Visit: Payer: Medicare Other | Attending: Internal Medicine

## 2020-02-04 ENCOUNTER — Encounter: Payer: Self-pay | Admitting: Cardiology

## 2020-02-04 DIAGNOSIS — Z23 Encounter for immunization: Secondary | ICD-10-CM

## 2020-02-04 NOTE — Progress Notes (Signed)
Cardiology Consult Note    Date:  02/05/2020   ID:  [name], DOB 01/05/1941, MRN [id number]  PCP:  [name], MD  Cardiologist:  [name], MD   Chief Complaint  Patient presents with  . New Patient (Initial Visit)    PAF    History of Present Illness:  [name] is a 79 y.o. male who is being seen today for the evaluation of PAF at the request of [name], MD.  This is a 79yo male with a hx of CKD stage 4, PAF (saw Dr. Terrence Dupont in the past), Carotid stenosis on the left, HLD and HTN who is referred here to establish cardiac care.  He has refused anticoagulation in the past and has been on ASA.  He is here today and is doing well.  He denies any chest pain or pressure, SOB, DOE(except when walking up stairs), PND, orthopnea, LE edema, dizziness, palpitations or syncope. He is no longer on Amiodarone and stopped Cardizem due to leg swelling.  He is compliant with his meds and is tolerating meds with no SE.    Past Medical History:  Diagnosis Date  . Benign localized hyperplasia of prostate with urinary obstruction 07/21/2015  . CAP (community acquired pneumonia) 04/14/2015  . Carotid stenosis    left  . CKD (chronic kidney disease) stage 4, GFR 15-29 ml/min (HCC) 04/14/2015  . Complication of anesthesia   . Elevated troponin 04/14/2015  . Essential hypertension 04/14/2015  . Gram-negative bacteremia 04/15/2015  . Headache    migraines  . Hearing loss   . Hematuria 04/19/2015  . HLD (hyperlipidemia)   . Normocytic hypochromic anemia 04/14/2015  . PAF (paroxysmal atrial fibrillation) ([place]) 04/14/2015  . PONV (postoperative nausea and vomiting)   . Sepsis secondary to UTI ([place]) 04/23/2015  . Transfusion history    last 04-24-15  . Urinary retention     Past Surgical History:  Procedure Laterality Date  . ESOPHAGOGASTRODUODENOSCOPY N/A 04/23/2015   Procedure: ESOPHAGOGASTRODUODENOSCOPY (EGD);  Surgeon: Carol Ada, MD;  Location[facility] ENDOSCOPY;  Service:  Endoscopy;  Laterality: N/A;  . shots behind eye     right  . TONSILLECTOMY    . TRANSURETHRAL RESECTION OF PROSTATE N/A 07/21/2015   Procedure: TRANSURETHRAL RESECTION OF THE PROSTATE (TURP) WITH CHIPS;  Surgeon: Carolan Clines, MD;  Location: [facility] ORS;  Service: Urology;  Laterality: N/A;  . TRANSURETHRAL RESECTION OF PROSTATE N/A 11/07/2015   Procedure: TRANSURETHRAL RESECTION OF THE PROSTATE (TURP);  Surgeon: Carolan Clines, MD;  Location: [facility] ORS;  Service: Urology;  Laterality: N/A;  . VASECTOMY      Current Medications: Current Meds  Medication Sig  . hydrochlorothiazide (HYDRODIURIL) 25 MG tablet Take 25 mg by mouth daily.  Marland Kitchen lisinopril (ZESTRIL) 40 MG tablet Take 40 mg by mouth 2 (two) times daily.  . [DISCONTINUED] aspirin EC 81 MG tablet Take 81 mg by mouth daily.    Allergies:   Patient has no known allergies.   Social History   Socioeconomic History  . Marital status: Married    Spouse name: Not on file  . Number of children: Not on file  . Years of education: Not on file  . Highest education level: Not on file  Occupational History  . Not on file  Tobacco Use  . Smoking status: Never Smoker  . Smokeless tobacco: Never Used  Substance and Sexual Activity  . Alcohol use: No  . Drug use: No  . Sexual activity:  Not on file  Other Topics Concern  . Not on file  Social History Narrative  . Not on file   Social Determinants of Health   Financial Resource Strain:   . Difficulty of Paying Living Expenses:   Food Insecurity:   . Worried About Charity fundraiser in the Last Year:   . Arboriculturist in the Last Year:   Transportation Needs:   . Film/video editor (Medical):   Marland Kitchen Lack of Transportation (Non-Medical):   Physical Activity:   . Days of Exercise per Week:   . Minutes of Exercise per Session:   Stress:   . Feeling of Stress :   Social Connections:   . Frequency of Communication with Friends and Family:   . Frequency of Social  Gatherings with Friends and Family:   . Attends Religious Services:   . Active Member of Clubs or Organizations:   . Attends Archivist Meetings:   Marland Kitchen Marital Status:      Family History:  The patient's family history includes Breast cancer in his mother.   ROS:   Please see the history of present illness.    ROS All other systems reviewed and are negative.  No flowsheet data found.     PHYSICAL EXAM:   VS:  BP (!) 146/92   Pulse (!) 101   Ht 5\' 6"  (1.676 m)   Wt 216 lb 12.8 oz (98.3 kg)   BMI 34.99 kg/m    GEN: Well nourished, well developed, in no acute distress  HEENT: normal  Neck: no JVD, carotid bruits, or masses Cardiac: irregularly irregular; no murmurs, rubs, or gallops,no edema.  Intact distal pulses bilaterally.  Respiratory:  clear to auscultation bilaterally, normal work of breathing GI: soft, nontender, nondistended, + BS MS: no deformity or atrophy  Skin: warm and dry, no rash Neuro:  Alert and Oriented x 3, Strength and sensation are intact Psych: euthymic mood, full affect  Wt Readings from Last 3 Encounters:  02/05/20 216 lb 12.8 oz (98.3 kg)  11/07/15 206 lb 6 oz (93.6 kg)  10/30/15 206 lb 6 oz (93.6 kg)      Studies/Labs Reviewed:   EKG:  EKG is ordered today.  The ekg ordered today demonstrates atrial fibrillation with RVR at 101bpm  Recent Labs: No results found for requested labs within last 8760 hours.   Lipid Panel    Component Value Date/Time   CHOL 136 04/15/2015 0355   TRIG 134 04/15/2015 0355   HDL 23 (L) 04/15/2015 0355   CHOLHDL 5.9 04/15/2015 0355   VLDL 27 04/15/2015 0355   LDLCALC 86 04/15/2015 0355    Additional studies/ records that were reviewed today include:  Hospital notes from 2016, 2D echo    ASSESSMENT:    1. PAF (paroxysmal atrial fibrillation) ([place])   2. Essential hypertension   3. Stenosis of left carotid artery   4. Pure hypercholesterolemia      PLAN:  In order of problems listed  above:  1.  PAF -he has not had any palpitations recently -he stopped Amio and Cardizem  -CHADS2VASC score is 3>>he has refused anticoagulation in the past -creatinine was 1.77 and Hbg 11.2 in March 2021 on outside labs from PCP -recommend starting Eliquis 5mg  BID - we had a long discussion about the different anticoagulants and he is willing to try a DOAC -stop ASA -He is in afib today and unclear how long he has been in this.  THe last EKG I have showing NSR was from 2016.   -will start Toprol XL 25mg  daily for HR control -refer to afib clinic in 2-3 weeks -consider DCCV after 4 weeks of anticoagulation -check BMET again to make sure Creatinine is stable  2.  HTN -BP borderline controlled -continue Lisinopril 40mg  BID and HCTZ 25mg  daily -starting Toprol XL 25mg  daily which should help with BP  3.  HLD -followed by PCP -I will get a copy of last FLP from PCP -with carotid stenosis he should be on a statin  4.  Carotid stenosis -left carotid 50-69% and < 50% on the right by dopplers 2017 -repeat dopplers -no ASA due to DOAC -start statin pending results of FLP   Medication Adjustments/Labs and Tests Ordered: Current medicines are reviewed at length with the patient today.  Concerns regarding medicines are outlined above.  Medication changes, Labs and Tests ordered today are listed in the Patient Instructions below.  There are no Patient Instructions on file for this visit.   Signed, [name], MD  02/05/2020 9:33 AM    [address], [place], [place]  [address] Phone: [phone]; Fax: [phone]

## 2020-02-04 NOTE — Progress Notes (Signed)
   [id number]  Name:  [name]    MRN: [id number] DOB: 12-Mar-1941  02/04/2020  [name] was observed post Covid-19 immunization for 15 minutes without incident. He was provided with Vaccine Information Sheet and instruction to access the V-Safe system.   [name] was instructed to call 911 with any severe reactions post vaccine: Marland Kitchen Difficulty breathing  . Swelling of face and throat  . A fast heartbeat  . A bad rash all over body  . Dizziness and weakness   Immunizations Administered    Name Date Dose VIS Date Route   Pfizer COVID-19 Vaccine 02/04/2020  2:52 PM 0.3 mL 11/14/2018 Intramuscular   Manufacturer: [place]   Lot: [id number]   [place]: [id number]

## 2020-02-05 ENCOUNTER — Ambulatory Visit: Payer: Medicare Other | Admitting: Cardiology

## 2020-02-05 ENCOUNTER — Other Ambulatory Visit: Payer: Self-pay

## 2020-02-05 ENCOUNTER — Encounter: Payer: Self-pay | Admitting: Cardiology

## 2020-02-05 VITALS — BP 146/92 | HR 101 | Ht 66.0 in | Wt 216.8 lb

## 2020-02-05 DIAGNOSIS — E7800 Pure hypercholesterolemia, unspecified: Secondary | ICD-10-CM

## 2020-02-05 DIAGNOSIS — I480 Paroxysmal atrial fibrillation: Secondary | ICD-10-CM | POA: Diagnosis not present

## 2020-02-05 DIAGNOSIS — I6529 Occlusion and stenosis of unspecified carotid artery: Secondary | ICD-10-CM | POA: Insufficient documentation

## 2020-02-05 DIAGNOSIS — I10 Essential (primary) hypertension: Secondary | ICD-10-CM

## 2020-02-05 DIAGNOSIS — I6522 Occlusion and stenosis of left carotid artery: Secondary | ICD-10-CM

## 2020-02-05 DIAGNOSIS — E785 Hyperlipidemia, unspecified: Secondary | ICD-10-CM | POA: Insufficient documentation

## 2020-02-05 LAB — BASIC METABOLIC PANEL
BUN/Creatinine Ratio: 18 (ref 10–24)
BUN: 30 mg/dL — ABNORMAL HIGH (ref 8–27)
CO2: 27 mmol/L (ref 20–29)
Calcium: 9.5 mg/dL (ref 8.6–10.2)
Chloride: 103 mmol/L (ref 96–106)
Creatinine, Ser: 1.63 mg/dL — ABNORMAL HIGH (ref 0.76–1.27)
GFR calc Af Amer: 47 mL/min/{1.73_m2} — ABNORMAL LOW (ref >59)
GFR calc non Af Amer: 40 mL/min/{1.73_m2} — ABNORMAL LOW (ref >59)
Glucose: 87 mg/dL (ref 65–99)
Potassium: 4.2 mmol/L (ref 3.5–5.2)
Sodium: 142 mmol/L (ref 134–144)

## 2020-02-05 MED ORDER — METOPROLOL SUCCINATE ER 25 MG PO TB24: 25.0000 mg | ORAL_TABLET | Freq: Every day | ORAL | 3 refills | Status: DC

## 2020-02-05 MED ORDER — APIXABAN 5 MG PO TABS: 5.0000 mg | ORAL_TABLET | Freq: Two times a day (BID) | ORAL | 3 refills | Status: DC

## 2020-02-05 NOTE — Patient Instructions (Signed)
Medication Instructions:  Your physician has recommended you make the following change in your medication:  1) STOP taking aspirin 2) START taking Toprol XL 25 mg daily  3) START taking Eliquis 5 mg BID  *If you need a refill on your cardiac medications before your next appointment, please call your pharmacy*   Lab Work: TODAY: CBC, CMET  If you have labs (blood work) drawn today and your tests are completely normal, you will receive your results only by: Marland Kitchen MyChart Message (if you have MyChart) OR . A paper copy in the mail If you have any lab test that is abnormal or we need to change your treatment, we will call you to review the results.   Testing/Procedures: Your physician has requested that you have an echocardiogram. Echocardiography is a painless test that uses sound waves to create images of your heart. It provides your doctor with information about the size and shape of your heart and how well your heart's chambers and valves are working. This procedure takes approximately one hour. There are no restrictions for this procedure.  Your physician has requested that you have a carotid duplex. This test is an ultrasound of the carotid arteries in your neck. It looks at blood flow through these arteries that supply the brain with blood. Allow one hour for this exam. There are no restrictions or special instructions.  Follow-Up: At [facility], you and your health needs are our priority.  As part of our continuing mission to provide you with exceptional heart care, we have created designated Provider Care Teams.  These Care Teams include your primary Cardiologist (physician) and Advanced Practice Providers (APPs -  Physician Assistants and Nurse Practitioners) who all work together to provide you with the care you need, when you need it.  We recommend signing up for the patient portal called "MyChart".  Sign up information is provided on this After Visit Summary.  MyChart is used to  connect with patients for Virtual Visits (Telemedicine).  Patients are able to view lab/test results, encounter notes, upcoming appointments, etc.  Non-urgent messages can be sent to your provider as well.   To learn more about what you can do with MyChart, go to [email or web address].    Your next appointment:   6 month(s)  The format for your next appointment:   In Person  Provider:   You may see [name], MD or one of the following Advanced Practice Providers on your designated Care Team:    Melina Copa, PA-C  Ermalinda Barrios, PA-C  You have been referred to see the [facility] in 3 weeks.

## 2020-02-06 ENCOUNTER — Telehealth: Payer: Self-pay

## 2020-02-06 NOTE — Telephone Encounter (Signed)
Patients wife returning Carly's call.

## 2020-02-06 NOTE — Telephone Encounter (Signed)
The patient has been notified of the result and verbalized understanding.  All questions (if any) were answered. Antonieta Iba, RN 02/06/2020 10:46 AM  Spoke with the patient and he refused to start on Lipitor or any cholesterol medication. He also states that he is not going to take Eliquis that was prescribed to him yesterday. I had a long conversation with him on the importance of both medications and his increased risk of stroke without them. Patient still refused.

## 2020-02-06 NOTE — Telephone Encounter (Signed)
Left message with patient's wife to have the patient call back.

## 2020-02-06 NOTE — Telephone Encounter (Signed)
-----   Message from Sueanne Margarita, MD sent at 02/05/2020  4:06 PM EDT ----- Labs reviewed and LDL is 141.  LDL goal < 70 due to carotid stenosis.  Please start Lipitor 40mg  daily and get an FLp and ALT in 6 weeks Traci Turner ----- Message ----- From: Antonieta Iba, RN Sent: 02/05/2020   1:51 PM EDT To: Sueanne Margarita, MD  LDL was 141 on 11/21/19

## 2020-02-06 NOTE — Progress Notes (Signed)
Spoke with the patient and advised him that Dr. [name] was going to refer him to the A fib clinic. Patient verbalized understanding.

## 2020-02-26 ENCOUNTER — Encounter (HOSPITAL_COMMUNITY): Payer: Self-pay | Admitting: Nurse Practitioner

## 2020-02-26 ENCOUNTER — Ambulatory Visit (HOSPITAL_BASED_OUTPATIENT_CLINIC_OR_DEPARTMENT_OTHER): Payer: Medicare Other

## 2020-02-26 ENCOUNTER — Ambulatory Visit (HOSPITAL_BASED_OUTPATIENT_CLINIC_OR_DEPARTMENT_OTHER)
Admission: RE | Admit: 2020-02-26 | Discharge: 2020-02-26 | Disposition: A | Discharge status: Home / Self Care | Payer: Medicare Other | Source: Ambulatory Visit | Attending: Nurse Practitioner | Admitting: Nurse Practitioner

## 2020-02-26 ENCOUNTER — Telehealth: Payer: Self-pay | Admitting: Cardiology

## 2020-02-26 ENCOUNTER — Other Ambulatory Visit: Payer: Self-pay

## 2020-02-26 VITALS — BP 178/106 | HR 82 | Ht 66.0 in | Wt 215.0 lb

## 2020-02-26 DIAGNOSIS — I5033 Acute on chronic diastolic (congestive) heart failure: Secondary | ICD-10-CM | POA: Diagnosis not present

## 2020-02-26 DIAGNOSIS — Z7901 Long term (current) use of anticoagulants: Secondary | ICD-10-CM | POA: Insufficient documentation

## 2020-02-26 DIAGNOSIS — T45516A Underdosing of anticoagulants, initial encounter: Secondary | ICD-10-CM | POA: Diagnosis not present

## 2020-02-26 DIAGNOSIS — I480 Paroxysmal atrial fibrillation: Secondary | ICD-10-CM | POA: Diagnosis not present

## 2020-02-26 DIAGNOSIS — Z7982 Long term (current) use of aspirin: Secondary | ICD-10-CM | POA: Insufficient documentation

## 2020-02-26 DIAGNOSIS — M79672 Pain in left foot: Secondary | ICD-10-CM | POA: Diagnosis not present

## 2020-02-26 DIAGNOSIS — Z20822 Contact with and (suspected) exposure to covid-19: Secondary | ICD-10-CM | POA: Diagnosis not present

## 2020-02-26 DIAGNOSIS — I10 Essential (primary) hypertension: Secondary | ICD-10-CM | POA: Diagnosis not present

## 2020-02-26 DIAGNOSIS — J811 Chronic pulmonary edema: Secondary | ICD-10-CM | POA: Diagnosis not present

## 2020-02-26 DIAGNOSIS — Z79899 Other long term (current) drug therapy: Secondary | ICD-10-CM | POA: Insufficient documentation

## 2020-02-26 DIAGNOSIS — Z803 Family history of malignant neoplasm of breast: Secondary | ICD-10-CM | POA: Diagnosis not present

## 2020-02-26 DIAGNOSIS — I6522 Occlusion and stenosis of left carotid artery: Secondary | ICD-10-CM | POA: Diagnosis not present

## 2020-02-26 DIAGNOSIS — I080 Rheumatic disorders of both mitral and aortic valves: Secondary | ICD-10-CM | POA: Diagnosis not present

## 2020-02-26 DIAGNOSIS — Z9079 Acquired absence of other genital organ(s): Secondary | ICD-10-CM | POA: Insufficient documentation

## 2020-02-26 DIAGNOSIS — N184 Chronic kidney disease, stage 4 (severe): Secondary | ICD-10-CM | POA: Insufficient documentation

## 2020-02-26 DIAGNOSIS — Z91128 Patient's intentional underdosing of medication regimen for other reason: Secondary | ICD-10-CM | POA: Diagnosis not present

## 2020-02-26 DIAGNOSIS — R55 Syncope and collapse: Secondary | ICD-10-CM | POA: Diagnosis not present

## 2020-02-26 DIAGNOSIS — I4819 Other persistent atrial fibrillation: Secondary | ICD-10-CM

## 2020-02-26 DIAGNOSIS — I351 Nonrheumatic aortic (valve) insufficiency: Secondary | ICD-10-CM | POA: Insufficient documentation

## 2020-02-26 DIAGNOSIS — M7989 Other specified soft tissue disorders: Secondary | ICD-10-CM | POA: Diagnosis not present

## 2020-02-26 DIAGNOSIS — R519 Headache, unspecified: Secondary | ICD-10-CM | POA: Diagnosis not present

## 2020-02-26 DIAGNOSIS — K449 Diaphragmatic hernia without obstruction or gangrene: Secondary | ICD-10-CM | POA: Diagnosis not present

## 2020-02-26 DIAGNOSIS — Z743 Need for continuous supervision: Secondary | ICD-10-CM | POA: Diagnosis not present

## 2020-02-26 DIAGNOSIS — I130 Hypertensive heart and chronic kidney disease with heart failure and stage 1 through stage 4 chronic kidney disease, or unspecified chronic kidney disease: Secondary | ICD-10-CM | POA: Diagnosis not present

## 2020-02-26 DIAGNOSIS — H9190 Unspecified hearing loss, unspecified ear: Secondary | ICD-10-CM | POA: Diagnosis not present

## 2020-02-26 DIAGNOSIS — E785 Hyperlipidemia, unspecified: Secondary | ICD-10-CM | POA: Diagnosis not present

## 2020-02-26 DIAGNOSIS — R0689 Other abnormalities of breathing: Secondary | ICD-10-CM | POA: Diagnosis not present

## 2020-02-26 DIAGNOSIS — I129 Hypertensive chronic kidney disease with stage 1 through stage 4 chronic kidney disease, or unspecified chronic kidney disease: Secondary | ICD-10-CM | POA: Insufficient documentation

## 2020-02-26 DIAGNOSIS — I4891 Unspecified atrial fibrillation: Secondary | ICD-10-CM | POA: Diagnosis not present

## 2020-02-26 DIAGNOSIS — Z66 Do not resuscitate: Secondary | ICD-10-CM | POA: Diagnosis not present

## 2020-02-26 DIAGNOSIS — R0602 Shortness of breath: Secondary | ICD-10-CM | POA: Diagnosis not present

## 2020-02-26 DIAGNOSIS — M109 Gout, unspecified: Secondary | ICD-10-CM | POA: Diagnosis not present

## 2020-02-26 DIAGNOSIS — R000 Tachycardia, unspecified: Secondary | ICD-10-CM | POA: Diagnosis not present

## 2020-02-26 LAB — ECHOCARDIOGRAM COMPLETE
Height: 66 in
Weight: 3440 oz
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
## 2020-02-26 NOTE — Telephone Encounter (Signed)
New message     Patient had echo today and stopped by the check out desk stating that he had a question for the nurse.  He did not say exactly what the question was, he thought that he did not get his lab results from the nurse.  He was somewhat confused in what he wanted.  He just asked for a nurse to call him later today or tomorrow.

## 2020-02-26 NOTE — Telephone Encounter (Signed)
I spoke to the patient and reviewed his upcoming appointments, particularly his Carotid US on Thursday 6/10.  He verbalized understanding and was thankful for the call.

## 2020-02-27 ENCOUNTER — Telehealth: Payer: Self-pay

## 2020-02-27 DIAGNOSIS — I272 Pulmonary hypertension, unspecified: Secondary | ICD-10-CM

## 2020-02-27 NOTE — Telephone Encounter (Signed)
The patient has been notified of the result and verbalized understanding.  All questions (if any) were answered. Antonieta Iba, RN 02/27/2020 3:47 PM  Orders have been placed for VQ scan, PFTs, and home sleep study.

## 2020-02-27 NOTE — Telephone Encounter (Signed)
-----   Message from Sueanne Margarita, MD sent at 02/27/2020  1:51 PM EDT ----- Echo showed normal heart funciton with moderately thickened heart muscle, severe enlargement of the LA, mildly leaky MV and AV.  THere is moderately increased PA pressures.  Please get a VQ scan to rule out chronic thromboembolic disease, PFTs with DLCO and home sleep study to assess for etiologies of pulmonary HTN

## 2020-02-28 ENCOUNTER — Other Ambulatory Visit: Payer: Self-pay

## 2020-02-28 ENCOUNTER — Emergency Department (HOSPITAL_COMMUNITY): Payer: Medicare Other

## 2020-02-28 ENCOUNTER — Observation Stay (HOSPITAL_COMMUNITY): Payer: Medicare Other

## 2020-02-28 ENCOUNTER — Ambulatory Visit (HOSPITAL_COMMUNITY): Payer: Medicare Other

## 2020-02-28 ENCOUNTER — Encounter (HOSPITAL_COMMUNITY): Payer: Self-pay | Admitting: *Deleted

## 2020-02-28 ENCOUNTER — Telehealth: Payer: Self-pay | Admitting: *Deleted

## 2020-02-28 ENCOUNTER — Inpatient Hospital Stay (HOSPITAL_COMMUNITY)
Admission: EM | Admit: 2020-02-28 | Discharge: 2020-03-02 | DRG: 308 | Disposition: A | Discharge status: Home Health | Payer: Medicare Other | Source: Ambulatory Visit | Attending: Internal Medicine | Admitting: Internal Medicine

## 2020-02-28 DIAGNOSIS — Z7982 Long term (current) use of aspirin: Secondary | ICD-10-CM

## 2020-02-28 DIAGNOSIS — I480 Paroxysmal atrial fibrillation: Principal | ICD-10-CM | POA: Diagnosis present

## 2020-02-28 DIAGNOSIS — E785 Hyperlipidemia, unspecified: Secondary | ICD-10-CM | POA: Diagnosis present

## 2020-02-28 DIAGNOSIS — H9190 Unspecified hearing loss, unspecified ear: Secondary | ICD-10-CM | POA: Diagnosis present

## 2020-02-28 DIAGNOSIS — M79672 Pain in left foot: Secondary | ICD-10-CM | POA: Diagnosis present

## 2020-02-28 DIAGNOSIS — Y92009 Unspecified place in unspecified non-institutional (private) residence as the place of occurrence of the external cause: Secondary | ICD-10-CM

## 2020-02-28 DIAGNOSIS — M7989 Other specified soft tissue disorders: Secondary | ICD-10-CM | POA: Diagnosis not present

## 2020-02-28 DIAGNOSIS — Z803 Family history of malignant neoplasm of breast: Secondary | ICD-10-CM

## 2020-02-28 DIAGNOSIS — M109 Gout, unspecified: Secondary | ICD-10-CM | POA: Diagnosis present

## 2020-02-28 DIAGNOSIS — I4891 Unspecified atrial fibrillation: Secondary | ICD-10-CM | POA: Diagnosis present

## 2020-02-28 DIAGNOSIS — T45516A Underdosing of anticoagulants, initial encounter: Secondary | ICD-10-CM | POA: Diagnosis present

## 2020-02-28 DIAGNOSIS — Z79899 Other long term (current) drug therapy: Secondary | ICD-10-CM

## 2020-02-28 DIAGNOSIS — N401 Enlarged prostate with lower urinary tract symptoms: Secondary | ICD-10-CM | POA: Diagnosis present

## 2020-02-28 DIAGNOSIS — I5033 Acute on chronic diastolic (congestive) heart failure: Secondary | ICD-10-CM | POA: Diagnosis present

## 2020-02-28 DIAGNOSIS — N184 Chronic kidney disease, stage 4 (severe): Secondary | ICD-10-CM | POA: Diagnosis present

## 2020-02-28 DIAGNOSIS — I130 Hypertensive heart and chronic kidney disease with heart failure and stage 1 through stage 4 chronic kidney disease, or unspecified chronic kidney disease: Secondary | ICD-10-CM | POA: Diagnosis present

## 2020-02-28 DIAGNOSIS — Z6834 Body mass index (BMI) 34.0-34.9, adult: Secondary | ICD-10-CM

## 2020-02-28 DIAGNOSIS — Z66 Do not resuscitate: Secondary | ICD-10-CM | POA: Diagnosis present

## 2020-02-28 DIAGNOSIS — Z7901 Long term (current) use of anticoagulants: Secondary | ICD-10-CM

## 2020-02-28 DIAGNOSIS — I080 Rheumatic disorders of both mitral and aortic valves: Secondary | ICD-10-CM | POA: Diagnosis present

## 2020-02-28 DIAGNOSIS — E669 Obesity, unspecified: Secondary | ICD-10-CM | POA: Diagnosis present

## 2020-02-28 DIAGNOSIS — R55 Syncope and collapse: Secondary | ICD-10-CM | POA: Diagnosis present

## 2020-02-28 DIAGNOSIS — Z20822 Contact with and (suspected) exposure to covid-19: Secondary | ICD-10-CM | POA: Diagnosis present

## 2020-02-28 DIAGNOSIS — Z91128 Patient's intentional underdosing of medication regimen for other reason: Secondary | ICD-10-CM

## 2020-02-28 DIAGNOSIS — I10 Essential (primary) hypertension: Secondary | ICD-10-CM | POA: Diagnosis present

## 2020-02-28 DIAGNOSIS — I6522 Occlusion and stenosis of left carotid artery: Secondary | ICD-10-CM | POA: Diagnosis present

## 2020-02-28 LAB — PROTIME-INR
INR: 1.2 (ref 0.8–1.2)
Prothrombin Time: 14.3 seconds (ref 11.4–15.2)

## 2020-02-28 LAB — CBC WITH DIFFERENTIAL/PLATELET
Abs Immature Granulocytes: 0.05 10*3/uL (ref 0.00–0.07)
Basophils Absolute: 0 10*3/uL (ref 0.0–0.1)
Basophils Relative: 0 %
Eosinophils Absolute: 0 10*3/uL (ref 0.0–0.5)
Eosinophils Relative: 0 %
HCT: 39.3 % (ref 39.0–52.0)
Hemoglobin: 12.3 g/dL — ABNORMAL LOW (ref 13.0–17.0)
Immature Granulocytes: 1 %
Lymphocytes Relative: 6 %
Lymphs Abs: 0.7 10*3/uL (ref 0.7–4.0)
MCH: 28.7 pg (ref 26.0–34.0)
MCHC: 31.3 g/dL (ref 30.0–36.0)
MCV: 91.6 fL (ref 80.0–100.0)
Monocytes Absolute: 0.9 10*3/uL (ref 0.1–1.0)
Monocytes Relative: 8 %
Neutro Abs: 9.1 10*3/uL — ABNORMAL HIGH (ref 1.7–7.7)
Neutrophils Relative %: 85 %
Platelets: 259 10*3/uL (ref 150–400)
RBC: 4.29 MIL/uL (ref 4.22–5.81)
RDW: 15.1 % (ref 11.5–15.5)
WBC: 10.7 10*3/uL — ABNORMAL HIGH (ref 4.0–10.5)
nRBC: 0 % (ref 0.0–0.2)

## 2020-02-28 LAB — COMPREHENSIVE METABOLIC PANEL
ALT: 21 U/L (ref 0–44)
AST: 21 U/L (ref 15–41)
Albumin: 4.2 g/dL (ref 3.5–5.0)
Alkaline Phosphatase: 53 U/L (ref 38–126)
Anion gap: 12 (ref 5–15)
BUN: 28 mg/dL — ABNORMAL HIGH (ref 8–23)
CO2: 25 mmol/L (ref 22–32)
Calcium: 9.3 mg/dL (ref 8.9–10.3)
Chloride: 101 mmol/L (ref 98–111)
Creatinine, Ser: 1.56 mg/dL — ABNORMAL HIGH (ref 0.61–1.24)
GFR calc Af Amer: 49 mL/min — ABNORMAL LOW (ref >60)
GFR calc non Af Amer: 43 mL/min — ABNORMAL LOW (ref >60)
Glucose, Bld: 139 mg/dL — ABNORMAL HIGH (ref 70–99)
Potassium: 3.7 mmol/L (ref 3.5–5.1)
Sodium: 138 mmol/L (ref 135–145)
Total Bilirubin: 1.3 mg/dL — ABNORMAL HIGH (ref 0.3–1.2)
Total Protein: 7.4 g/dL (ref 6.5–8.1)

## 2020-02-28 LAB — TROPONIN I (HIGH SENSITIVITY)
Troponin I (High Sensitivity): 19 ng/L — ABNORMAL HIGH (ref <18)
Troponin I (High Sensitivity): 24 ng/L — ABNORMAL HIGH (ref <18)

## 2020-02-28 LAB — URIC ACID: Uric Acid, Serum: 9.7 mg/dL — ABNORMAL HIGH (ref 3.7–8.6)

## 2020-02-28 LAB — HEMOGLOBIN A1C
Hgb A1c MFr Bld: 6.9 % — ABNORMAL HIGH (ref 4.8–5.6)
Mean Plasma Glucose: 151.33 mg/dL

## 2020-02-28 LAB — HEPARIN LEVEL (UNFRACTIONATED): Heparin Unfractionated: 0.49 IU/mL (ref 0.30–0.70)

## 2020-02-28 LAB — SARS CORONAVIRUS 2 BY RT PCR (HOSPITAL ORDER, PERFORMED IN ~~LOC~~ HOSPITAL LAB): SARS Coronavirus 2: NEGATIVE

## 2020-02-28 LAB — TSH: TSH: 1.211 u[IU]/mL (ref 0.350–4.500)

## 2020-02-28 MED ORDER — HEPARIN BOLUS VIA INFUSION
4250.0000 [IU] | Freq: Once | INTRAVENOUS | Status: DC
  Filled 2020-02-28: qty 4250

## 2020-02-28 MED ORDER — ACETAMINOPHEN 650 MG RE SUPP: 650.0000 mg | Freq: Four times a day (QID) | RECTAL | Status: DC | PRN

## 2020-02-28 MED ORDER — ACETAMINOPHEN 325 MG PO TABS: 650.0000 mg | ORAL_TABLET | Freq: Four times a day (QID) | ORAL | Status: DC | PRN

## 2020-02-28 MED ORDER — DILTIAZEM HCL-DEXTROSE 125-5 MG/125ML-% IV SOLN (PREMIX)
5.0000 mg/h | INTRAVENOUS | Status: DC
  Administered 2020-02-28: 10 mg/h via INTRAVENOUS
  Filled 2020-02-28: qty 125

## 2020-02-28 MED ORDER — METOPROLOL SUCCINATE ER 25 MG PO TB24: 25.0000 mg | ORAL_TABLET | Freq: Every day | ORAL | Status: DC

## 2020-02-28 MED ORDER — MORPHINE SULFATE (PF) 2 MG/ML IV SOLN: 2.0000 mg | INTRAVENOUS | Status: DC | PRN

## 2020-02-28 MED ORDER — DILTIAZEM LOAD VIA INFUSION
20.0000 mg | Freq: Once | INTRAVENOUS | Status: AC
  Administered 2020-02-28 (×2): 20 mg via INTRAVENOUS
  Filled 2020-02-28: qty 20

## 2020-02-28 MED ORDER — HEPARIN (PORCINE) 25000 UT/250ML-% IV SOLN
1200.0000 [IU]/h | INTRAVENOUS | Status: DC
  Administered 2020-02-28 – 2020-02-29 (×2): 1200 [IU]/h via INTRAVENOUS
  Filled 2020-02-28 (×2): qty 250

## 2020-02-28 MED ORDER — ONDANSETRON HCL 4 MG/2ML IJ SOLN: 4.0000 mg | Freq: Four times a day (QID) | INTRAMUSCULAR | Status: DC | PRN

## 2020-02-28 MED ORDER — DILTIAZEM HCL-DEXTROSE 125-5 MG/125ML-% IV SOLN (PREMIX)
5.0000 mg/h | INTRAVENOUS | Status: DC
  Administered 2020-02-28: 10 mg/h via INTRAVENOUS
  Filled 2020-02-28 (×3): qty 125

## 2020-02-28 MED ORDER — HEPARIN BOLUS VIA INFUSION
4200.0000 [IU] | Freq: Once | INTRAVENOUS | Status: AC
  Administered 2020-02-28: 4200 [IU] via INTRAVENOUS
  Filled 2020-02-28: qty 4200

## 2020-02-28 MED ORDER — COLCHICINE 0.6 MG PO TABS
0.6000 mg | ORAL_TABLET | Freq: Every day | ORAL | Status: DC
  Administered 2020-02-28 – 2020-03-02 (×4): 0.6 mg via ORAL
  Filled 2020-02-28 (×4): qty 1

## 2020-02-28 MED ORDER — LISINOPRIL 40 MG PO TABS: 40.0000 mg | ORAL_TABLET | Freq: Every day | ORAL | Status: DC

## 2020-02-28 MED ORDER — ONDANSETRON HCL 4 MG PO TABS: 4.0000 mg | ORAL_TABLET | Freq: Four times a day (QID) | ORAL | Status: DC | PRN

## 2020-02-28 MED ORDER — SODIUM CHLORIDE 0.9% FLUSH
3.0000 mL | Freq: Two times a day (BID) | INTRAVENOUS | Status: DC
  Administered 2020-02-29 – 2020-03-01 (×4): 3 mL via INTRAVENOUS

## 2020-02-28 NOTE — H&P (Signed)
History and Physical    [name] JGG:[id number] DOB: Mar 30, 1941 DOA: 02/28/2020  PCP: [name], MD Consultants:  [name] - cardiology; [facility] - nephrology Patient coming from:  Home - lives with wife; NOK: Wife, [name], [phone]  Chief Complaint: Syncope  HPI: [name] is a 79 y.o. male with medical history significant of afib, noncompliant on Eliquis; HLD; HTN; stage 4 CKD; BPH: and carotid stenosis presenting with syncope, in rapid afib.  His wife reports a problem with his left foot, ?gout.  He couldn't walk so he got into the floor and wasn't able to get up.  They lifted him up and got him to the steps and they couldn't get him up the stairs.  EMS said he passed out multiple times but he reports that he did not pass out (he is adamant about that).  His breathing got real fast and they put him on oxygen.  He has afib but did not notice afib this AM - EMS noted this.  He is hungry and has been having left foot pain.  He is not any more SOB than usual.  He is not noticing his wheezing (although it is audible at the bedside, wife reports this is chronic but better when he is upright).  He is not taking Eliquis - he prefers not to take it (took only 1 tablet from samples provided and decided not to take more).    ED Course: h/o afib, increasing chronicity.  Refusing AC.  Had syncope today, multiple witnessed events with EMS.  Rapid EMS on arrival, no memory of syncope.  On Cardizem.  Not taking Eliquis?  No chest pain.  Review of Systems: As per HPI; otherwise review of systems reviewed and negative.   Ambulatory Status:  Ambulates without assistance  COVID Vaccine Status:   Complete  Past Medical History:  Diagnosis Date  . Benign localized hyperplasia of prostate with urinary obstruction 07/21/2015  . CAP (community acquired pneumonia) 04/14/2015  . Carotid stenosis    left  . CKD (chronic kidney disease) stage 4, GFR 15-29 ml/min (HCC) 04/14/2015  .  Complication of anesthesia   . Elevated troponin 04/14/2015  . Essential hypertension 04/14/2015  . Gram-negative bacteremia 04/15/2015  . Headache    migraines  . Hearing loss   . Hematuria 04/19/2015  . HLD (hyperlipidemia)   . Normocytic hypochromic anemia 04/14/2015  . PAF (paroxysmal atrial fibrillation) ([place]) 04/14/2015  . PONV (postoperative nausea and vomiting)   . Sepsis secondary to UTI ([place]) 04/23/2015  . Transfusion history    last 04-24-15  . Urinary retention     Past Surgical History:  Procedure Laterality Date  . ESOPHAGOGASTRODUODENOSCOPY N/A 04/23/2015   Procedure: ESOPHAGOGASTRODUODENOSCOPY (EGD);  Surgeon: Carol Ada, MD;  Location[facility] ENDOSCOPY;  Service: Endoscopy;  Laterality: N/A;  . shots behind eye     right  . TONSILLECTOMY    . TRANSURETHRAL RESECTION OF PROSTATE N/A 07/21/2015   Procedure: TRANSURETHRAL RESECTION OF THE PROSTATE (TURP) WITH CHIPS;  Surgeon: Carolan Clines, MD;  Location: [facility] ORS;  Service: Urology;  Laterality: N/A;  . TRANSURETHRAL RESECTION OF PROSTATE N/A 11/07/2015   Procedure: TRANSURETHRAL RESECTION OF THE PROSTATE (TURP);  Surgeon: Carolan Clines, MD;  Location: [facility] ORS;  Service: Urology;  Laterality: N/A;  . VASECTOMY      Social History   Socioeconomic History  . Marital status: Married    Spouse name: Not on file  . Number of children: Not on  file  . Years of education: Not on file  . Highest education level: Not on file  Occupational History  . Occupation: retired  Tobacco Use  . Smoking status: Never Smoker  . Smokeless tobacco: Never Used  Substance and Sexual Activity  . Alcohol use: No  . Drug use: No  . Sexual activity: Not on file  Other Topics Concern  . Not on file  Social History Narrative  . Not on file   Social Determinants of Health   Financial Resource Strain:   . Difficulty of Paying Living Expenses:   Food Insecurity:   . Worried About Charity fundraiser in the Last Year:   . Academic librarian in the Last Year:   Transportation Needs:   . Film/video editor (Medical):   Marland Kitchen Lack of Transportation (Non-Medical):   Physical Activity:   . Days of Exercise per Week:   . Minutes of Exercise per Session:   Stress:   . Feeling of Stress :   Social Connections:   . Frequency of Communication with Friends and Family:   . Frequency of Social Gatherings with Friends and Family:   . Attends Religious Services:   . Active Member of Clubs or Organizations:   . Attends Archivist Meetings:   Marland Kitchen Marital Status:   Intimate Partner Violence:   . Fear of Current or Ex-Partner:   . Emotionally Abused:   Marland Kitchen Physically Abused:   . Sexually Abused:     No Known Allergies  Family History  Problem Relation Age of Onset  . Breast cancer Mother     Prior to Admission medications   Medication Sig Start Date End Date Taking? Authorizing Provider  Acetaminophen (TYLENOL) 325 MG CAPS Take 325 mg by mouth as needed.    [provider]  apixaban (ELIQUIS) 5 MG TABS tablet Take 1 tablet (5 mg total) by mouth 2 (two) times daily. Patient not taking: Reported on 02/26/2020 02/05/20   Sueanne Margarita, MD  aspirin 81 MG EC tablet Take 81 mg by mouth daily.    [provider]  Aspirin-Acetaminophen-Caffeine (GOODYS EXTRA STRENGTH PO) Take by mouth. Takes as needed    [provider]  Aspirin-Salicylamide-Caffeine (BC HEADACHE POWDER PO) Take by mouth. Takes as needed    [provider]  hydrochlorothiazide (HYDRODIURIL) 25 MG tablet Take 25 mg by mouth daily. 01/21/20   [provider]  lisinopril (ZESTRIL) 40 MG tablet Take 40 mg by mouth 2 (two) times daily. 01/21/20   [provider]  metoprolol succinate (TOPROL XL) 25 MG 24 hr tablet Take 1 tablet (25 mg total) by mouth daily. 02/05/20   Sueanne Margarita, MD    Physical Exam: Vitals:   02/28/20 1311 02/28/20 1327 02/28/20 1722 02/28/20 1820  BP:  131/85 (!) 141/94 (!) 140/97  Pulse:   87  75  Resp:  (!) 22 (!) 24 (!) 23  Temp:    98.2 F (36.8 C)  TempSrc:    Oral  SpO2:  100%  92%  Weight: 97.5 kg     Height: 5\' 6"  (1.676 m)   5\' 6"  (1.676 m)     . General:  Appears calm and comfortable and is NAD . Eyes:  PERRL, EOMI, normal lids, iris . ENT: hard of hearing, grossly normal  lips & tongue, mmm . Neck:  no LAD, masses or thyromegaly . Cardiovascular:  Irregularly irregular, improved rate control on Dilt drip, no  m/r/g. No LE edema.  Marland Kitchen Respiratory:   CTA bilaterally with no wheezes/rales/rhonchi.  Normal respiratory effort. . Abdomen:  soft, NT, ND, NABS . Skin:  no rash or induration seen on limited exam . Musculoskeletal:  grossly normal tone BUE/BLE, good ROM, no bony abnormality; left foot with dorsal midfoot edema and diffuse mild erythema without apparent ulceration or deformity . Psychiatric:  grossly normal mood and affect, speech fluent and appropriate, AOx3 . Neurologic:  CN 2-12 grossly intact, moves all extremities in coordinated fashion    Radiological Exams on Admission: DG Chest Port 1 View  Result Date: 02/28/2020 CLINICAL DATA:  79 year old male with shortness of breath and atrial fibrillation. EXAM: PORTABLE CHEST 1 VIEW COMPARISON:  07/17/2015 chest radiograph. FINDINGS: UPPER limits normal heart size again noted. Mild pulmonary vascular congestion is present. A large hiatal hernia is again identified. There is no evidence of focal airspace disease, pulmonary edema, suspicious pulmonary nodule/mass, pleural effusion, or pneumothorax. No acute bony abnormalities are identified. IMPRESSION: 1. Mild pulmonary vascular congestion. 2. Large hiatal hernia. Electronically Signed   By: Margarette Canada M.D.   On: 02/28/2020 13:33    EKG: Independently reviewed.  Afib with rate 116; prolonged QTc 502; nonspecific ST changes with no evidence of acute ischemia   Labs on Admission: I have personally reviewed the available labs and imaging studies at the time of  the admission.  Pertinent labs:   Glucose 139 BUN 28/Creatinine 1.56/GFR 43 HS troponin 19, 24 WBC 10.7 Hgb 12.3 INR 1.2  Assessment/Plan Principal Problem:   Atrial fibrillation with RVR (HCC) Active Problems:   Essential hypertension   CKD (chronic kidney disease) stage 4, GFR 15-29 ml/min (HCC)   HLD (hyperlipidemia)   Syncope   Left foot pain   Obesity (BMI 30.0-34.9)    Afib with RVR  -Patient with known afib, presenting with RVR  -It is not clear if this is incidental or related to his presentation (see below) -Regardless, he was started on Dilt drip for rate control -Will plan to place in observation status in SDU for Diltiazem drip as per protocol with plan to transition to PO Diltiazem once heart rate is controlled.  -HS troponin minimally elevated with negative delta, unlikely ACS. -Patient has been noncompliant with Eliquis at home. -Patient was started on Heparin drip; will continue for now but the patient understands the risks of not taking AC at home and is absolutely unwilling to take it.   -Will need to resume ASA 81 mg PO daily when no longer on AC. -Will observe overnight in progressive care and attempt to transition to oral Dilt with probable d/c to home tomorrow. -Continue Toprol XL  Syncope -Patient is quite adamant that he did not experience syncope (reportedly seen pass out multiple times by EMS) -His wife is uncertain but reports that his issues that she saw at home were related to imbalance associated with foot pain -He has a known h/o carotid stenosis and was ordered to have a carotid US but it does not appear that this has been scheduled -Will order carotid doppler now to determine if additional evaluation/treatment is needed -Recent echo done by Dr. [name] on 6/8  L foot pain -Patient with report of left midfoot pain/swelling/diffuse erythema, denies trauma -Xray ordered; preliminary reading without fracture -No prior h/o gout but his uric acid  is elevated to 9.7 -Will start colchicine for now -Consider Allopurinol with titration to goal UA <7 -If not improving with colchicine, steroids could be  considered -PT/OT evaluation ordered  HTN -Continue Toprol, Lisinopril -Hold HCTZ  HLD -He does not appear to be taking medication for this issue at this time  Stage 4 CKD -Currently appears to be improved from baseline -Hold HCTZ and consider whether this should be continued based on renal dysfunction -Continue Lisinopril for now  Obesity -Body mass index is 34.7 kg/m. -Weight loss should be encouraged   Note: This patient has been tested and is pending for the novel coronavirus COVID-19.   DVT prophylaxis: Heparin  Code Status:  DNR - confirmed with patient/wife Family Communication: Wife was present throughout evaluation Disposition Plan:  The patient is from: home  Anticipated d/c is to: home without [facility] services  Anticipated d/c date will depend on clinical response to treatment, but possibly as early as tomorrow if he has excellent response to treatment  Patient is currently: acutely ill Consults called: PT/OT Admission status: It is my clinical opinion that referral for OBSERVATION is reasonable and necessary in this patient based on the above information provided. The aforementioned taken together are felt to place the patient at high risk for further clinical deterioration. However it is anticipated that the patient may be medically stable for discharge from the hospital within 24 to 48 hours.     Karmen Bongo MD [facility]   How to contact the [facility] Attending or Consulting provider [place] or covering provider during after hours [place], for this patient?  1. Check the care team in [facility] and look for a) attending/consulting TR[facility] provider listed and b) the [facility] team listed 2. Log into [email or web address] and use [place]'s universal password to access. If you do not have the password, please contact the hospital  operator. 3. Locate the [facility] Acute Care provider you are looking for under [facility] and page to a number that you can be directly reached. 4. If you still have difficulty reaching the provider, please page the [facility] (Director on Call) for the Hospitalists listed on amion for assistance.   02/28/2020, 6:31 PM

## 2020-02-28 NOTE — Progress Notes (Signed)
ANTICOAGULATION CONSULT NOTE - Initial Consult  Pharmacy Consult for heparin Indication: atrial fibrillation  No Known Allergies  Patient Measurements: Height: 5\' 6"  (167.6 cm) Weight: 97.5 kg (215 lb) IBW/kg (Calculated) : 63.8 Heparin Dosing Weight: 85 kg  Vital Signs: Temp: 98.5 F (36.9 C) (06/10 1310) Temp Source: Oral (06/10 1310) BP: 131/85 (06/10 1327) Pulse Rate: 87 (06/10 1327)  Labs: Recent Labs    02/28/20 1245  HGB 12.3*  HCT 39.3  PLT 259  LABPROT 14.3  INR 1.2  CREATININE 1.56*  TROPONINIHS 19*    Estimated Creatinine Clearance: 44 mL/min (A) (by C-G formula based on SCr of 1.56 mg/dL (H)).   Medical History: Past Medical History:  Diagnosis Date  . Benign localized hyperplasia of prostate with urinary obstruction 07/21/2015  . CAP (community acquired pneumonia) 04/14/2015  . Carotid stenosis    left  . CKD (chronic kidney disease) stage 4, GFR 15-29 ml/min (HCC) 04/14/2015  . Complication of anesthesia   . Elevated troponin 04/14/2015  . Essential hypertension 04/14/2015  . Gram-negative bacteremia 04/15/2015  . Headache    migraines  . Hearing loss   . Hematuria 04/19/2015  . HLD (hyperlipidemia)   . Normocytic hypochromic anemia 04/14/2015  . PAF (paroxysmal atrial fibrillation) ([place]) 04/14/2015  . PONV (postoperative nausea and vomiting)   . Sepsis secondary to UTI ([place]) 04/23/2015  . Transfusion history    last 04-24-15  . Urinary retention     Assessment: 33 yom with atrial fibrillation on no anticoagulation PTA 2/2 NSAID use and desire to not be on anticoagulation. CHADSVA2Sc 3. Baseline Hgb low at 12.3, plts wnl at 259.    Goal of Therapy:  Heparin level 0.3-0.7 units/ml Monitor platelets by anticoagulation protocol: Yes   Plan:  Give 4200 units bolus x 1 Start heparin infusion at 1200 units/hr Check anti-Xa level in 8 hours and daily while on heparin Continue to monitor H&H and platelets  Thank you,   Eddie Candle,  PharmD PGY-1 Pharmacy Resident   Please check amion for clinical pharmacist contact number 02/28/2020,2:14 PM

## 2020-02-28 NOTE — ED Triage Notes (Signed)
Patient presents to ed via [facility] , ems states patient fell this am ems was called to help patient up however he had several syncopal episodes with ems c/o increased weakness for "a while" , several episodes of ddry heaves states he was suppose to be taking amiodarone however he wouldn't take it so he was changed to lopressor and hasn't been taking that either states he just didn't want to. Patient is suppose to be taking eliquis  However isn't patient c/o left foot red and sore , wife states it starting getting red yest. And was worse today. Patient is alert and oriented to present , doesn't remember falling.

## 2020-02-28 NOTE — ED Provider Notes (Signed)
[facility] EMERGENCY DEPARTMENT Provider Note   CSN: [id number] Arrival date & time: 02/28/20  1232     History No chief complaint on file.   Frank W Starling Christofferson. is a 79 y.o. male.  HPI   This patient is a 79 year old male, he has a known history of prior current chronic kidney disease, hypertension, paroxysmal atrial fibrillation and has had history of sepsis secondary to urinary infection.  The paramedics bring the patient to the hospital today after having reported fall.  When they got called out to the house they found the patient to be on the ground, they tried to assist him up however over the course of short period of time he had multiple syncopal episodes witnessed by the paramedics.  He was not diaphoretic or sweaty during this time but was noted to be severely tachycardic with atrial fibrillation ranging between 130 and 150 bpm.  The patient is hard of hearing but is able to answer my questions and states that he has not wanted to take blood thinners despite being prescribed blood thinners in the past.  In fact when I look at his medical history it looks like Eliquis has been prescribed as recently as 3 weeks ago but he does not take this medication.  The patient was seen 2 days ago in the atrial fibrillation clinic by the nurse practitioner and at that time was rate controlled on metoprolol, and had been seen by Dr. [name] recently started on Eliquis but the patient refuses to take.  He had also been on amiodarone in the past.  The patient has no complaints of fevers vomiting or diarrhea, he is unaware that his feet are swollen or red  Past Medical History:  Diagnosis Date  . Benign localized hyperplasia of prostate with urinary obstruction 07/21/2015  . CAP (community acquired pneumonia) 04/14/2015  . Carotid stenosis    left  . CKD (chronic kidney disease) stage 4, GFR 15-29 ml/min (HCC) 04/14/2015  . Complication of anesthesia   . Elevated troponin 04/14/2015    . Essential hypertension 04/14/2015  . Gram-negative bacteremia 04/15/2015  . Headache    migraines  . Hearing loss   . Hematuria 04/19/2015  . HLD (hyperlipidemia)   . Normocytic hypochromic anemia 04/14/2015  . PAF (paroxysmal atrial fibrillation) ([place]) 04/14/2015  . PONV (postoperative nausea and vomiting)   . Sepsis secondary to UTI ([place]) 04/23/2015  . Transfusion history    last 04-24-15  . Urinary retention     Patient Active Problem List   Diagnosis Date Noted  . Carotid stenosis   . HLD (hyperlipidemia)   . Benign prostatic hypertrophy 11/07/2015  . Benign localized hyperplasia of prostate with urinary obstruction 07/21/2015  . Sepsis secondary to UTI ([place]) 04/23/2015  . Hematuria 04/19/2015  . Gram-negative bacteremia 04/15/2015  . AKI (acute kidney injury) ([place]) 04/15/2015  . Acute renal failure ([place])   . Atrial fibrillation with RVR ([place]) 04/14/2015  . CAP (community acquired pneumonia) 04/14/2015  . Essential hypertension 04/14/2015  . Sepsis ([place]) 04/14/2015  . Elevated troponin 04/14/2015  . Normocytic hypochromic anemia 04/14/2015  . CKD (chronic kidney disease) stage 4, GFR 15-29 ml/min (HCC) 04/14/2015    Past Surgical History:  Procedure Laterality Date  . ESOPHAGOGASTRODUODENOSCOPY N/A 04/23/2015   Procedure: ESOPHAGOGASTRODUODENOSCOPY (EGD);  Surgeon: Carol Ada, MD;  Location: [facility] ENDOSCOPY;  Service: Endoscopy;  Laterality: N/A;  . shots behind eye     right  . TONSILLECTOMY    .  TRANSURETHRAL RESECTION OF PROSTATE N/A 07/21/2015   Procedure: TRANSURETHRAL RESECTION OF THE PROSTATE (TURP) WITH CHIPS;  Surgeon: Carolan Clines, MD;  Location: [facility] ORS;  Service: Urology;  Laterality: N/A;  . TRANSURETHRAL RESECTION OF PROSTATE N/A 11/07/2015   Procedure: TRANSURETHRAL RESECTION OF THE PROSTATE (TURP);  Surgeon: Carolan Clines, MD;  Location: [facility] ORS;  Service: Urology;  Laterality: N/A;  . VASECTOMY         Family History  Problem Relation Age of  Onset  . Breast cancer Mother     Social History   Tobacco Use  . Smoking status: Never Smoker  . Smokeless tobacco: Never Used  Substance Use Topics  . Alcohol use: No  . Drug use: No    Home Medications Prior to Admission medications   Medication Sig Start Date End Date Taking? Authorizing Provider  Acetaminophen (TYLENOL) 325 MG CAPS Take 325 mg by mouth as needed.    [provider]  apixaban (ELIQUIS) 5 MG TABS tablet Take 1 tablet (5 mg total) by mouth 2 (two) times daily. Patient not taking: Reported on 02/26/2020 02/05/20   Sueanne Margarita, MD  aspirin 81 MG EC tablet Take 81 mg by mouth daily.    [provider]  Aspirin-Acetaminophen-Caffeine (GOODYS EXTRA STRENGTH PO) Take by mouth. Takes as needed    [provider]  Aspirin-Salicylamide-Caffeine (BC HEADACHE POWDER PO) Take by mouth. Takes as needed    [provider]  hydrochlorothiazide (HYDRODIURIL) 25 MG tablet Take 25 mg by mouth daily. 01/21/20   [provider]  lisinopril (ZESTRIL) 40 MG tablet Take 40 mg by mouth 2 (two) times daily. 01/21/20   [provider]  metoprolol succinate (TOPROL XL) 25 MG 24 hr tablet Take 1 tablet (25 mg total) by mouth daily. 02/05/20   Sueanne Margarita, MD    Allergies    Patient has no known allergies.  Review of Systems   Review of Systems  All other systems reviewed and are negative.   Physical Exam Updated Vital Signs There were no vitals taken for this visit.  Physical Exam Vitals and nursing note reviewed.  Constitutional:      Appearance: He is well-developed. He is ill-appearing. He is not toxic-appearing.  HENT:     Head: Normocephalic and atraumatic.     Mouth/Throat:     Pharynx: No oropharyngeal exudate.  Eyes:     General: No scleral icterus.       Right eye: No discharge.        Left eye: No discharge.     Conjunctiva/sclera: Conjunctivae normal.     Pupils: Pupils are equal, round, and reactive to  light.  Neck:     Thyroid: No thyromegaly.     Vascular: No JVD.  Cardiovascular:     Rate and Rhythm: Tachycardia present. Rhythm irregular.     Heart sounds: Normal heart sounds. No murmur heard.  No friction rub. No gallop.   Pulmonary:     Effort: Pulmonary effort is normal. No respiratory distress.     Breath sounds: Normal breath sounds. No wheezing or rales.  Abdominal:     General: Bowel sounds are normal. There is no distension.     Palpations: Abdomen is soft. There is no mass.     Tenderness: There is no abdominal tenderness.  Musculoskeletal:        General: No tenderness. Normal range of motion.     Cervical back: Normal range of motion and neck  supple.     Right lower leg: Edema present.     Left lower leg: Edema present.  Lymphadenopathy:     Cervical: No cervical adenopathy.  Skin:    General: Skin is warm and dry.     Findings: Erythema and rash present.  Neurological:     Mental Status: He is alert.     Coordination: Coordination normal.  Psychiatric:        Behavior: Behavior normal.     ED Results / Procedures / Treatments   Labs (all labs ordered are listed, but only abnormal results are displayed) Labs Reviewed  CBC WITH DIFFERENTIAL/PLATELET  COMPREHENSIVE METABOLIC PANEL  PROTIME-INR  TROPONIN I (HIGH SENSITIVITY)    EKG None  Radiology ECHOCARDIOGRAM COMPLETE  Result Date: 02/26/2020    ECHOCARDIOGRAM REPORT   Patient Name:   Frank Toste. Date of Exam: 02/26/2020 Medical Rec #:  [id number]          Height:       66.0 in Accession #:    [id number]         Weight:       215.0 lb Date of Birth:  1940-10-24         BSA:          2.062 m Patient Age:    54 years           BP:           149/106 mmHg Patient Gender: M                  HR:           91 bpm. Exam Location:  [facility] Procedure: 2D Echo, Cardiac Doppler and Color Doppler Indications:    I48.0 Atrial Fibrillation  History:        Patient has prior history of Echocardiogram  examinations, most                 recent 04/15/2015. Risk Factors:Hypertension and HLD, CKD.  Sonographer:    Marygrace Drought RCS Referring Phys: Sandy Level  1. Left ventricular ejection fraction, by estimation, is 55 to 60%. The left ventricle has normal function. The left ventricle has no regional wall motion abnormalities. There is moderate concentric left ventricular hypertrophy. Left ventricular diastolic parameters are indeterminate.  2. Right ventricular systolic function is normal. The right ventricular size is normal. There is moderately elevated pulmonary artery systolic pressure.  3. Left atrial size was severely dilated.  4. The mitral valve is normal in structure. Mild mitral valve regurgitation. No evidence of mitral stenosis.  5. The aortic valve is tricuspid. Aortic valve regurgitation is mild. No aortic stenosis is present.  6. The inferior vena cava is normal in size with greater than 50% respiratory variability, suggesting right atrial pressure of 3 mmHg. FINDINGS  Left Ventricle: Left ventricular ejection fraction, by estimation, is 55 to 60%. The left ventricle has normal function. The left ventricle has no regional wall motion abnormalities. The left ventricular internal cavity size was normal in size. There is  moderate concentric left ventricular hypertrophy. Left ventricular diastolic parameters are indeterminate. Indeterminate filling pressures. Right Ventricle: The right ventricular size is normal. No increase in right ventricular wall thickness. Right ventricular systolic function is normal. There is moderately elevated pulmonary artery systolic pressure. The tricuspid regurgitant velocity is 3.42 m/s, and with an assumed right atrial pressure of 3 mmHg, the estimated right ventricular systolic pressure is 49.8  mmHg. Left Atrium: Left atrial size was severely dilated. Right Atrium: Right atrial size was normal in size. Pericardium: There is no evidence of pericardial  effusion. Mitral Valve: The mitral valve is normal in structure. Normal mobility of the mitral valve leaflets. Mild mitral valve regurgitation. No evidence of mitral valve stenosis. Tricuspid Valve: The tricuspid valve is normal in structure. Tricuspid valve regurgitation is trivial. No evidence of tricuspid stenosis. Aortic Valve: The aortic valve is tricuspid. Aortic valve regurgitation is mild. Aortic regurgitation PHT measures 579 msec. No aortic stenosis is present. Pulmonic Valve: The pulmonic valve was normal in structure. Pulmonic valve regurgitation is not visualized. No evidence of pulmonic stenosis. Aorta: The aortic root is normal in size and structure. Venous: The inferior vena cava is normal in size with greater than 50% respiratory variability, suggesting right atrial pressure of 3 mmHg. IAS/Shunts: No atrial level shunt detected by color flow Doppler.  LEFT VENTRICLE PLAX 2D LVIDd:         4.09 cm  Diastology LVIDs:         2.83 cm  LV e' lateral:   11.60 cm/s LV PW:         1.61 cm  LV E/e' lateral: 8.3 LV IVS:        1.39 cm  LV e' medial:    8.05 cm/s LVOT diam:     2.20 cm  LV E/e' medial:  11.9 LV SV:         66 LV SV Index:   32 LVOT Area:     3.80 cm  RIGHT VENTRICLE RV Basal diam:  3.73 cm RV S prime:     15.00 cm/s TAPSE (M-mode): 1.7 cm RVSP:           49.8 mmHg LEFT ATRIUM             Index       RIGHT ATRIUM           Index LA diam:        4.40 cm 2.13 cm/m  RA Pressure: 3.00 mmHg LA Vol (A2C):   71.3 ml 34.57 ml/m RA Area:     20.00 cm LA Vol (A4C):   84.5 ml 40.98 ml/m RA Volume:   58.90 ml  28.56 ml/m LA Biplane Vol: 81.5 ml 39.52 ml/m  AORTIC VALVE LVOT Vmax:   80.80 cm/s LVOT Vmean:  58.033 cm/s LVOT VTI:    0.173 m AI PHT:      579 msec  AORTA Ao Root diam: 3.50 cm MITRAL VALVE               TRICUSPID VALVE MV Area (PHT):             TR Peak grad:   46.8 mmHg MV Decel Time: 151 msec    TR Vmax:        342.00 cm/s MV E velocity: 96.10 cm/s  Estimated RAP:  3.00 mmHg                             RVSP:           49.8 mmHg                             SHUNTS  Systemic VTI:  0.17 m                            Systemic Diam: 2.20 cm Skeet Latch MD Electronically signed by Skeet Latch MD Signature Date/Time: 02/26/2020/9:49:59 PM    Final     Procedures .Critical Care Performed by: Noemi Chapel, MD Authorized by: Noemi Chapel, MD   Critical care provider statement:    Critical care time (minutes):  35   Critical care time was exclusive of:  Separately billable procedures and treating other patients and teaching time   Critical care was necessary to treat or prevent imminent or life-threatening deterioration of the following conditions:  Cardiac failure   Critical care was time spent personally by me on the following activities:  Blood draw for specimens, development of treatment plan with patient or surrogate, discussions with consultants, evaluation of patient's response to treatment, examination of patient, obtaining history from patient or surrogate, ordering and performing treatments and interventions, ordering and review of laboratory studies, ordering and review of radiographic studies, pulse oximetry, re-evaluation of patient's condition and review of old charts   (including critical care time)  Medications Ordered in ED Medications  diltiazem (CARDIZEM) 1 mg/mL load via infusion 20 mg (has no administration in time range)    And  diltiazem (CARDIZEM) 125 mg in dextrose 5% 125 mL (1 mg/mL) infusion (has no administration in time range)    ED Course  I have reviewed the triage vital signs and the nursing notes.  Pertinent labs & imaging results that were available during my care of the patient were reviewed by me and considered in my medical decision making (see chart for details).    MDM Rules/Calculators/A&P                          This patient has bilateral pedal edema, left foot more than right foot, he also has a  history of atrial fibrillation and is currently in atrial fibrillation, he is not rate controlled with heart rates ranging up to as high as 160 bpm.  It is unclear exactly why the patient had multiple syncopal episodes today however he will need to be admitted to the hospital for this in the presence of his uncontrolled A. fib.  I have started Cardizem drip, the patient will be counseled on anticoagulation and at likely needs to be on heparin.  At this time the patient is mildly dyspneic and will need a chest x-ray.  He is critically ill with uncontrolled A. Fib.  On a Cardizem drip, the patient had some slight rate control, he did have an elevated troponin however ultimately patient will require admission to the hospital, high level of care, heparin drip, persistent uncontrolled A. fib.  Final Clinical Impression(s) / ED Diagnoses Final diagnoses:  Foot pain, left  Atrial fibrillation with rapid ventricular rate    Noemi Chapel, MD 02/29/20 [phone]

## 2020-02-28 NOTE — Telephone Encounter (Signed)
-----   Message from Antonieta Iba, RN sent at 02/27/2020  3:50 PM EDT ----- Home sleep study has been ordered.  Thanks!

## 2020-02-28 NOTE — Progress Notes (Signed)
ANTICOAGULATION CONSULT NOTE - Follow Up Consult  Pharmacy Consult for heparin Indication: atrial fibrillation  Labs: Recent Labs    02/28/20 1245 02/28/20 1522 02/28/20 2234  HGB 12.3*  --   --   HCT 39.3  --   --   PLT 259  --   --   LABPROT 14.3  --   --   INR 1.2  --   --   HEPARINUNFRC  --   --  0.49  CREATININE 1.56*  --   --   TROPONINIHS 19* 24*  --     Assessment/Plan:  79yo male therapeutic on heparin with initial dosing for Afib. Will continue gtt at current rate and confirm stable with am labs.   Wynona Neat, PharmD, BCPS  02/28/2020,11:29 PM

## 2020-02-29 ENCOUNTER — Inpatient Hospital Stay (HOSPITAL_COMMUNITY): Payer: Medicare Other

## 2020-02-29 DIAGNOSIS — I5033 Acute on chronic diastolic (congestive) heart failure: Secondary | ICD-10-CM | POA: Diagnosis not present

## 2020-02-29 DIAGNOSIS — E785 Hyperlipidemia, unspecified: Secondary | ICD-10-CM | POA: Diagnosis present

## 2020-02-29 DIAGNOSIS — Z66 Do not resuscitate: Secondary | ICD-10-CM | POA: Diagnosis present

## 2020-02-29 DIAGNOSIS — N184 Chronic kidney disease, stage 4 (severe): Secondary | ICD-10-CM | POA: Diagnosis not present

## 2020-02-29 DIAGNOSIS — Z91128 Patient's intentional underdosing of medication regimen for other reason: Secondary | ICD-10-CM | POA: Diagnosis not present

## 2020-02-29 DIAGNOSIS — E669 Obesity, unspecified: Secondary | ICD-10-CM | POA: Diagnosis present

## 2020-02-29 DIAGNOSIS — I130 Hypertensive heart and chronic kidney disease with heart failure and stage 1 through stage 4 chronic kidney disease, or unspecified chronic kidney disease: Secondary | ICD-10-CM | POA: Diagnosis not present

## 2020-02-29 DIAGNOSIS — T45516A Underdosing of anticoagulants, initial encounter: Secondary | ICD-10-CM | POA: Diagnosis present

## 2020-02-29 DIAGNOSIS — I6522 Occlusion and stenosis of left carotid artery: Secondary | ICD-10-CM | POA: Diagnosis present

## 2020-02-29 DIAGNOSIS — H9190 Unspecified hearing loss, unspecified ear: Secondary | ICD-10-CM | POA: Diagnosis present

## 2020-02-29 DIAGNOSIS — N401 Enlarged prostate with lower urinary tract symptoms: Secondary | ICD-10-CM | POA: Diagnosis present

## 2020-02-29 DIAGNOSIS — I4891 Unspecified atrial fibrillation: Secondary | ICD-10-CM | POA: Diagnosis not present

## 2020-02-29 DIAGNOSIS — Z803 Family history of malignant neoplasm of breast: Secondary | ICD-10-CM | POA: Diagnosis not present

## 2020-02-29 DIAGNOSIS — Z79899 Other long term (current) drug therapy: Secondary | ICD-10-CM | POA: Diagnosis not present

## 2020-02-29 DIAGNOSIS — Y92009 Unspecified place in unspecified non-institutional (private) residence as the place of occurrence of the external cause: Secondary | ICD-10-CM | POA: Diagnosis not present

## 2020-02-29 DIAGNOSIS — Z20822 Contact with and (suspected) exposure to covid-19: Secondary | ICD-10-CM | POA: Diagnosis not present

## 2020-02-29 DIAGNOSIS — R0902 Hypoxemia: Secondary | ICD-10-CM | POA: Diagnosis not present

## 2020-02-29 DIAGNOSIS — Z7901 Long term (current) use of anticoagulants: Secondary | ICD-10-CM | POA: Diagnosis not present

## 2020-02-29 DIAGNOSIS — I080 Rheumatic disorders of both mitral and aortic valves: Secondary | ICD-10-CM | POA: Diagnosis present

## 2020-02-29 DIAGNOSIS — M109 Gout, unspecified: Secondary | ICD-10-CM | POA: Diagnosis present

## 2020-02-29 DIAGNOSIS — Z6834 Body mass index (BMI) 34.0-34.9, adult: Secondary | ICD-10-CM | POA: Diagnosis not present

## 2020-02-29 DIAGNOSIS — Z7982 Long term (current) use of aspirin: Secondary | ICD-10-CM | POA: Diagnosis not present

## 2020-02-29 DIAGNOSIS — R55 Syncope and collapse: Secondary | ICD-10-CM | POA: Diagnosis not present

## 2020-02-29 DIAGNOSIS — I480 Paroxysmal atrial fibrillation: Secondary | ICD-10-CM | POA: Diagnosis not present

## 2020-02-29 LAB — BASIC METABOLIC PANEL
Anion gap: 9 (ref 5–15)
BUN: 26 mg/dL — ABNORMAL HIGH (ref 8–23)
CO2: 28 mmol/L (ref 22–32)
Calcium: 9 mg/dL (ref 8.9–10.3)
Chloride: 100 mmol/L (ref 98–111)
Creatinine, Ser: 1.64 mg/dL — ABNORMAL HIGH (ref 0.61–1.24)
GFR calc Af Amer: 46 mL/min — ABNORMAL LOW (ref >60)
GFR calc non Af Amer: 40 mL/min — ABNORMAL LOW (ref >60)
Glucose, Bld: 124 mg/dL — ABNORMAL HIGH (ref 70–99)
Potassium: 3.6 mmol/L (ref 3.5–5.1)
Sodium: 137 mmol/L (ref 135–145)

## 2020-02-29 LAB — CBC
HCT: 37.5 % — ABNORMAL LOW (ref 39.0–52.0)
Hemoglobin: 12.1 g/dL — ABNORMAL LOW (ref 13.0–17.0)
MCH: 29.4 pg (ref 26.0–34.0)
MCHC: 32.3 g/dL (ref 30.0–36.0)
MCV: 91.2 fL (ref 80.0–100.0)
Platelets: 248 10*3/uL (ref 150–400)
RBC: 4.11 MIL/uL — ABNORMAL LOW (ref 4.22–5.81)
RDW: 15.2 % (ref 11.5–15.5)
WBC: 8.8 10*3/uL (ref 4.0–10.5)
nRBC: 0 % (ref 0.0–0.2)

## 2020-02-29 LAB — C-REACTIVE PROTEIN: CRP: 14.1 mg/dL — ABNORMAL HIGH (ref <1.0)

## 2020-02-29 LAB — BRAIN NATRIURETIC PEPTIDE: B Natriuretic Peptide: 218.1 pg/mL — ABNORMAL HIGH (ref 0.0–100.0)

## 2020-02-29 LAB — MAGNESIUM: Magnesium: 1.9 mg/dL (ref 1.7–2.4)

## 2020-02-29 LAB — HEPARIN LEVEL (UNFRACTIONATED): Heparin Unfractionated: 0.39 IU/mL (ref 0.30–0.70)

## 2020-02-29 MED ORDER — LISINOPRIL 10 MG PO TABS
10.0000 mg | ORAL_TABLET | Freq: Every day | ORAL | Status: DC
  Administered 2020-02-29 – 2020-03-01 (×2): 10 mg via ORAL
  Filled 2020-02-29 (×2): qty 1

## 2020-02-29 MED ORDER — METOPROLOL SUCCINATE ER 50 MG PO TB24
50.0000 mg | ORAL_TABLET | Freq: Every day | ORAL | Status: DC
  Administered 2020-02-29: 50 mg via ORAL
  Filled 2020-02-29: qty 1

## 2020-02-29 MED ORDER — RIVAROXABAN 20 MG PO TABS
20.0000 mg | ORAL_TABLET | Freq: Every day | ORAL | Status: DC
  Administered 2020-02-29: 20 mg via ORAL
  Filled 2020-02-29: qty 1

## 2020-02-29 MED ORDER — PANTOPRAZOLE SODIUM 40 MG PO TBEC
40.0000 mg | DELAYED_RELEASE_TABLET | Freq: Every day | ORAL | Status: DC
  Administered 2020-02-29 – 2020-03-02 (×3): 40 mg via ORAL
  Filled 2020-02-29 (×3): qty 1

## 2020-02-29 MED ORDER — METHYLPREDNISOLONE SODIUM SUCC 40 MG IJ SOLR
40.0000 mg | INTRAMUSCULAR | Status: DC
  Administered 2020-02-29: 40 mg via INTRAVENOUS
  Filled 2020-02-29: qty 1

## 2020-02-29 MED ORDER — DIGOXIN 0.25 MG/ML IJ SOLN
0.1250 mg | Freq: Four times a day (QID) | INTRAMUSCULAR | Status: AC
  Administered 2020-02-29 (×2): 0.125 mg via INTRAVENOUS
  Filled 2020-02-29 (×2): qty 2

## 2020-02-29 MED ORDER — METOPROLOL SUCCINATE ER 50 MG PO TB24
50.0000 mg | ORAL_TABLET | Freq: Once | ORAL | Status: AC
  Administered 2020-02-29: 50 mg via ORAL
  Filled 2020-02-29: qty 1

## 2020-02-29 MED ORDER — METOPROLOL SUCCINATE ER 100 MG PO TB24
100.0000 mg | ORAL_TABLET | Freq: Every day | ORAL | Status: DC
  Administered 2020-03-01 – 2020-03-02 (×2): 100 mg via ORAL
  Filled 2020-02-29 (×2): qty 1

## 2020-02-29 MED ORDER — FUROSEMIDE 40 MG PO TABS
40.0000 mg | ORAL_TABLET | Freq: Once | ORAL | Status: AC
  Administered 2020-02-29: 40 mg via ORAL
  Filled 2020-02-29: qty 1

## 2020-02-29 MED ORDER — POTASSIUM CHLORIDE CRYS ER 20 MEQ PO TBCR
40.0000 meq | EXTENDED_RELEASE_TABLET | Freq: Once | ORAL | Status: AC
  Administered 2020-02-29: 40 meq via ORAL
  Filled 2020-02-29: qty 2

## 2020-02-29 MED ORDER — DILTIAZEM HCL 25 MG/5ML IV SOLN
10.0000 mg | Freq: Four times a day (QID) | INTRAVENOUS | Status: DC | PRN
  Filled 2020-02-29: qty 5

## 2020-02-29 MED ORDER — METOPROLOL SUCCINATE ER 25 MG PO TB24: 25.0000 mg | ORAL_TABLET | Freq: Once | ORAL | Status: DC

## 2020-02-29 NOTE — Progress Notes (Signed)
ANTICOAGULATION CONSULT NOTE  Pharmacy Consult for heparin Indication: atrial fibrillation  No Known Allergies  Patient Measurements: Height: 5\' 6"  (167.6 cm) Weight: 92.5 kg (203 lb 14.8 oz) IBW/kg (Calculated) : 63.8 Heparin Dosing Weight: 85 kg  Vital Signs: Temp: 98.5 F (36.9 C) (06/11 0354) Temp Source: Oral (06/11 0354) BP: 134/92 (06/11 0354) Pulse Rate: 104 (06/11 0354)  Labs: Recent Labs    02/28/20 1245 02/28/20 1522 02/28/20 2234 02/29/20 0456  HGB 12.3*  --   --  12.1*  HCT 39.3  --   --  37.5*  PLT 259  --   --  248  LABPROT 14.3  --   --   --   INR 1.2  --   --   --   HEPARINUNFRC  --   --  0.49 0.39  CREATININE 1.56*  --   --   --   TROPONINIHS 19* 24*  --   --     Estimated Creatinine Clearance: 42.9 mL/min (A) (by C-G formula based on SCr of 1.56 mg/dL (H)).   Medical History: Past Medical History:  Diagnosis Date  . Benign localized hyperplasia of prostate with urinary obstruction 07/21/2015  . CAP (community acquired pneumonia) 04/14/2015  . Carotid stenosis    left  . CKD (chronic kidney disease) stage 4, GFR 15-29 ml/min (HCC) 04/14/2015  . Complication of anesthesia   . Elevated troponin 04/14/2015  . Essential hypertension 04/14/2015  . Gram-negative bacteremia 04/15/2015  . Headache    migraines  . Hearing loss   . Hematuria 04/19/2015  . HLD (hyperlipidemia)   . Normocytic hypochromic anemia 04/14/2015  . PAF (paroxysmal atrial fibrillation) ([place]) 04/14/2015  . PONV (postoperative nausea and vomiting)   . Sepsis secondary to UTI ([place]) 04/23/2015  . Transfusion history    last 04-24-15  . Urinary retention     Assessment: 66 yoM with known AFib admitted with AFib RVR. Pt refuses anticoagulation with [name] at home, currently on heparin infusion. Heparin therapeutic this morning, CBC stable.   Goal of Therapy:  Heparin level 0.3-0.7 units/ml Monitor platelets by anticoagulation protocol: Yes   Plan:  -Continue heparin 1200  units/h -Daily heparin level and CBC   [name], PharmD, BCPS Clinical Pharmacist [phone] Please check AMION for all [facility] Pharmacy numbers 02/29/2020

## 2020-02-29 NOTE — Progress Notes (Signed)
ANTICOAGULATION CONSULT NOTE  Pharmacy Consult for heparin Indication: atrial fibrillation  No Known Allergies  Patient Measurements: Height: 5\' 6"  (167.6 cm) Weight: 92.5 kg (203 lb 14.8 oz) IBW/kg (Calculated) : 63.8 Heparin Dosing Weight: 85 kg  Vital Signs: Temp: 98.9 F (37.2 C) (06/11 0839) Temp Source: Oral (06/11 0839) BP: 133/95 (06/11 0915) Pulse Rate: 87 (06/11 0915)  Labs: Recent Labs    02/28/20 1245 02/28/20 1522 02/28/20 2234 02/29/20 0456  HGB 12.3*  --   --  12.1*  HCT 39.3  --   --  37.5*  PLT 259  --   --  248  LABPROT 14.3  --   --   --   INR 1.2  --   --   --   HEPARINUNFRC  --   --  0.49 0.39  CREATININE 1.56*  --   --  1.64*  TROPONINIHS 19* 24*  --   --     Estimated Creatinine Clearance: 40.8 mL/min (A) (by C-G formula based on SCr of 1.64 mg/dL (H)).   Medical History: Past Medical History:  Diagnosis Date  . Benign localized hyperplasia of prostate with urinary obstruction 07/21/2015  . CAP (community acquired pneumonia) 04/14/2015  . Carotid stenosis    left  . CKD (chronic kidney disease) stage 4, GFR 15-29 ml/min (HCC) 04/14/2015  . Complication of anesthesia   . Elevated troponin 04/14/2015  . Essential hypertension 04/14/2015  . Gram-negative bacteremia 04/15/2015  . Headache    migraines  . Hearing loss   . Hematuria 04/19/2015  . HLD (hyperlipidemia)   . Normocytic hypochromic anemia 04/14/2015  . PAF (paroxysmal atrial fibrillation) ([employer]) 04/14/2015  . PONV (postoperative nausea and vomiting)   . Sepsis secondary to UTI ([place]) 04/23/2015  . Transfusion history    last 04-24-15  . Urinary retention     Assessment: 79 yoM with known AFib admitted with AFib RVR. Pt refuses anticoagulation with Eliquis at home, currently on heparin infusion. Heparin therapeutic this morning, CBC stable.   Goal of Therapy:  Heparin level 0.3-0.7 units/ml Monitor platelets by anticoagulation protocol: Yes   Plan:  -Continue heparin 1200  units/h -Daily heparin level and CBC  ADDENDUM: Planning to transition to Xarelto. CrCl is 50 ml/min with TBW  Plan: -Stop IV heparin -Begin Xarelto 20mg  qSupper (first dose now)    [name], PharmD, BCPS Clinical Pharmacist [phone] Please check AMION for all [facility] Pharmacy numbers 02/29/2020

## 2020-02-29 NOTE — Evaluation (Signed)
Occupational Therapy Evaluation Patient Details Name: [name] MRN: [id number] DOB: 1940/11/20 Today's Date: 02/29/2020    History of Present Illness 79 y.o. male presenting with multiple syncopal episodes, rapid A-fib, slightly elevated troponin with negative delta, unlikely ACS, and bilateral foot pain (L>R) with suspected gout. PMHx significant for afib, noncompliant on Eliquis; HLD; HTN; stage 4 CKD; BPH: and carotid stenosis.   Clinical Impression   Prior to hospital admission, patient was living with his wife in a private residence and was grossly independent with BADLs without assistive device. Patient currently presenting below baseline level of function requiring Max A for supine to EOB transfers and Max A +2 for sit to stand, lateral steps toward [facility], and return to supine. Patient also requiring Mod A grossly for UB BADLs and Max A +2 grossly for LB BADLs including bathing/dressing. Patient also limited by decrease short-term memory, need for increased time to process verbal information, decreased awareness of deficits, and decreased problem solving. Wife and daughter present at baseline and in agreement with post-acute recommendation for short-term rehab. Patient would also benefit from continued acute skilled OT services to maximize independence and decrease need for assistance with BADLs with use of DME/AE, functional transfers, and mobility in prep for return to PLOF.     Follow Up Recommendations  SNF    Equipment Recommendations  Other (comment) (Defer to next level of care)    Recommendations for Other Services       Precautions / Restrictions Precautions Precautions: None Restrictions Weight Bearing Restrictions: No      Mobility Bed Mobility Overal bed mobility: Needs Assistance Bed Mobility: Rolling;Supine to Sit;Sit to Supine Rolling: Mod assist   Supine to sit: Max assist Sit to supine: Max assist;+2 for physical assistance   General bed mobility  comments: Supine to sit with HOB elevated and Max A to assist trunk upright. Max A +2 for return to supine at trunk and BLE  Transfers Overall transfer level: Needs assistance Equipment used: Rolling walker (2 wheeled) Transfers: Sit to/from Stand;Anterior-Posterior Transfer Sit to Stand: Max assist;+2 physical assistance     Anterior-Posterior transfers: Max assist (with use of chuck pad)   General transfer comment: Max A +2 for sit to stand from elevated EOB to RW and Max A +2 for lateral scoots toward HOB    Balance Overall balance assessment: Needs assistance Sitting-balance support: No upper extremity supported;Feet supported Sitting balance-Leahy Scale: Fair     Standing balance support: Bilateral upper extremity supported;During functional activity Standing balance-Leahy Scale: Poor                             ADL either performed or assessed with clinical judgement   ADL Overall ADL's : Needs assistance/impaired     Grooming: Set up           Upper Body Dressing : Moderate assistance   Lower Body Dressing: Maximal assistance Lower Body Dressing Details (indicate cue type and reason): To don footwear seated EOB Toilet Transfer: Total assistance;+2 for physical assistance Toilet Transfer Details (indicate cue type and reason): Per clinical judgement and functional assessment           General ADL Comments: Patient grossly Mod A for UB ADLs and Max A-Total A +2 for LB ADLs     Vision Baseline Vision/History: Wears glasses Wears Glasses: Reading only Vision Assessment?: Yes;No apparent visual deficits     Perception  Praxis      Pertinent Vitals/Pain Pain Assessment: Faces Faces Pain Scale: Hurts whole lot Pain Location: Bilateral feet with movement Pain Descriptors / Indicators:  (Unable to specify) Pain Intervention(s): Limited activity within patient's tolerance;Repositioned     Hand Dominance Left   Extremity/Trunk Assessment  Upper Extremity Assessment Upper Extremity Assessment: Generalized weakness   Lower Extremity Assessment Lower Extremity Assessment: Defer to PT evaluation   Cervical / Trunk Assessment Cervical / Trunk Assessment: Other exceptions Cervical / Trunk Exceptions: forward head   Communication Communication Communication: No difficulties   Cognition Arousal/Alertness: Lethargic Behavior During Therapy: Restless Overall Cognitive Status: Impaired/Different from baseline Area of Impairment: Memory;Following commands;Awareness;Problem solving                     Memory: Decreased short-term memory Following Commands: Follows one step commands consistently   Awareness: Emergent Problem Solving: Slow processing;Decreased initiation;Difficulty sequencing;Requires verbal cues     General Comments  HR 80-90bpm at rest. Max HR 110 with activity. SpO2 >90% on 3L via [place].    Exercises     Shoulder Instructions      Home Living Family/patient expects to be discharged to:: Private residence Living Arrangements: Spouse/significant other Available Help at Discharge: [place] Type of Home: House Home Access: Stairs to enter Technical brewer of Steps: 2 Entrance Stairs-Rails: Left (Ascending) Home Layout: One level     Bathroom Shower/Tub: Occupational psychologist: Handicapped height Bathroom Accessibility: Yes How Accessible: Accessible via walker Home Equipment: [place] - 2 wheels;Shower seat          Prior Functioning/Environment Level of Independence: Independent        Comments: Patient was independent with BADLs/IADLs without device.        OT Problem List: Decreased strength;Decreased activity tolerance;Impaired balance (sitting and/or standing);Decreased cognition;Decreased safety awareness;Decreased knowledge of use of DME or AE;Cardiopulmonary status limiting activity;Obesity;Pain;Increased edema (Edema in BLE L>R)      OT  Treatment/Interventions: Self-care/ADL training;Therapeutic exercise;Energy conservation;DME and/or AE instruction;Therapeutic activities;Cognitive remediation/compensation;Patient/family education;Balance training    OT Goals(Current goals can be found in the care plan section) Acute Rehab OT Goals Patient Stated Goal: To return home OT Goal Formulation: With patient/family Time For Goal Achievement: 03/14/20 Potential to Achieve Goals: Good ADL Goals Pt Will Perform Grooming: standing;with min assist Pt Will Perform Upper Body Dressing: with set-up;sitting Pt Will Perform Lower Body Dressing: with mod assist;with adaptive equipment;sit to/from stand Pt Will Transfer to Toilet: with mod assist;ambulating;grab bars Pt Will Perform Toileting - Clothing Manipulation and hygiene: with mod assist;sit to/from stand  OT Frequency: Min 2X/week   Barriers to D/C: Inaccessible home environment;Decreased caregiver support (Wife uanble to provide level of assistance necessary)          Co-evaluation              AM-PAC OT "6 Clicks" Daily Activity     Outcome Measure Help from another person eating meals?: A Little Help from another person taking care of personal grooming?: A Little Help from another person toileting, which includes using toliet, bedpan, or urinal?: Total Help from another person bathing (including washing, rinsing, drying)?: Total Help from another person to put on and taking off regular upper body clothing?: A Lot Help from another person to put on and taking off regular lower body clothing?: Total 6 Click Score: 11   End of Session Equipment Utilized During Treatment: Gait belt;Rolling walker Nurse Communication: Mobility status  Activity Tolerance: Patient  limited by fatigue;Patient limited by pain Patient left: in bed;with call bell/phone within reach;with bed alarm set;with nursing/sitter in room;with family/visitor present  OT Visit Diagnosis: Unsteadiness on  feet (R26.81);Muscle weakness (generalized) (M62.81);Pain Pain - Right/Left:  (Bilateral) Pain - part of body:  (Feet 2/2 suspected gout)                Time: 3943-2003 OT Time Calculation (min): 49 min Charges:  OT General Charges $OT Visit: 1 Visit OT Evaluation $OT Eval Moderate Complexity: 1 Mod OT Treatments $Therapeutic Activity: 23-37 mins  Leialoha Hanna H. OTR/L Supplemental OT, Department of rehab services [phone]  Lannie Yusuf R H. 02/29/2020, 3:21 PM

## 2020-02-29 NOTE — Progress Notes (Signed)
PT Cancellation Note  Patient Details Name: [name] MRN: [id number] DOB: 10/07/1940   Cancelled Treatment:    Reason Eval/Treat Not Completed: Medical issues which prohibited therapy.  Both RN and MD asks to hold due to pt's HR at rest in the 110's.  Will check back as able today. 02/29/2020  Ginger Carne., PT Acute Rehabilitation Services [phone]  (pager) [phone]  (office)   [name] 02/29/2020, 10:31 AM

## 2020-02-29 NOTE — Progress Notes (Addendum)
PROGRESS NOTE                                                                                                                                                                                                             Patient Demographics:    [name], is a 79 y.o. male, DOB - [birth year], IRS:[id number]  Admit date - 02/28/2020   Admitting Physician Karmen Bongo, MD  Outpatient Primary MD for the patient is [name], MD  LOS - 0  Chief Complaint  Patient presents with  . Atrial Fibrillation       Brief Narrative -  Frank W Moss Berry. is a 79 y.o. male with medical history significant of afib, noncompliant on Eliquis; HLD; HTN; stage 4 CKD; BPH: and carotid stenosis presenting with syncope, in rapid afib.  His wife reports a problem with his left foot, ?gout.  He couldn't walk so he got into the floor and wasn't able to get up, the ER he was diagnosed with left foot gout along with A. fib RVR and admit to the hospital.   Subjective:    [name] today has, No headache, No chest pain, No abdominal pain - No Nausea, No new weakness tingling or numbness, No Cough - SOB.    Assessment  & Plan :     1.  Paroxysmal atrial fibrillation with RVR [place] vas 2 score of greater than 4.  Recent echo noted with preserved EF, stable TSH, he is currently on Cardizem drip, have placed him on high-dose Toprol-XL, 2 doses of IV digoxin, will try to transition his heparin drip to Xarelto.  In the past he has not taken Eliquis due to monetary constraints.  We will try and have case management help him with procuring Xarelto at the time of discharge.  2.  Left foot gout with elevated uric acid levels.  On colchicine 1 dose of IV Solu-Medrol and monitor.  X-ray with no fracture.  3.  Hypertension.  For now Toprol-XL, have dropped the dose of lisinopril to provide room for Toprol-XL to aid with rate control.  4.  Mild wheezing.  Likely acute on chronic diastolic CHF-EF 00%.  Brought on  by RVR.  Lasix IV and monitor.  5.  Noted patient to have dyslipidemia, BPH, carotid artery disease, hearing loss.  Does not take medications or wear hearing aids per wife.  6.CKD 4 - baseline creat around 2, monitor.    Condition -  Guarded  Family Communication  :  Wife Inez Catalina [phone] on 02/29/2020.  Code Status : DNR  Consults  :  None  Procedures  :    TTE 02-2020 -   1. Left ventricular ejection fraction, by estimation, is 55 to 60%. The left ventricle has normal function. The left ventricle has no regional wall motion abnormalities. There is moderate concentric left ventricular hypertrophy. Left ventricular diastolic parameters are indeterminate.  2. Right ventricular systolic function is normal. The right ventricular size is normal. There is moderately elevated pulmonary artery systolic pressure.  3. Left atrial size was severely dilated.  4. The mitral valve is normal in structure. Mild mitral valve regurgitation. No evidence of mitral stenosis.  5. The aortic valve is tricuspid. Aortic valve regurgitation is mild. No aortic stenosis is present.  6. The inferior vena cava is normal in size with greater than 50% respiratory variability, suggesting right atrial pressure of 3 mmHg.   PUD Prophylaxis : PPI  Disposition Plan  :    Status is: Inpt  Dispo: The patient is from: Home              Anticipated d/c is to: Home              Anticipated d/c date is: 2 days              Patient currently is not medically stable to d/c.  A. fib RVR on IV Cardizem drip.  DVT Prophylaxis  :   Heparin >> Xarelto  Lab Results  Component Value Date   PLT 248 02/29/2020    Diet :  Diet Order            Diet Heart Room service appropriate? Yes; Fluid consistency: Thin  Diet effective now                  Inpatient Medications Scheduled Meds: . colchicine  0.6 mg Oral Daily  . digoxin  0.125 mg Intravenous Q6H  . lisinopril  10 mg Oral Daily  . metoprolol succinate   50 mg Oral Daily  . sodium chloride flush  3 mL Intravenous Q12H   Continuous Infusions: . diltiazem (CARDIZEM) infusion 10 mg/hr (02/28/20 2325)  . heparin 1,200 Units/hr (02/29/20 0921)   PRN Meds:.acetaminophen **OR** [DISCONTINUED] acetaminophen, morphine injection, [DISCONTINUED] ondansetron **OR** ondansetron (ZOFRAN) IV  Antibiotics  :   Anti-infectives (From admission, onward)   None         Objective:   Vitals:   02/29/20 0128 02/29/20 0354 02/29/20 0839 02/29/20 0915  BP: 136/88 (!) 134/92 136/90 (!) 133/95  Pulse: (!) 115 (!) 104 94 87  Resp: (!) 25 (!) 26 (!) 23   Temp: 98.8 F (37.1 C) 98.5 F (36.9 C) 98.9 F (37.2 C)   TempSrc: Oral Oral Oral   SpO2: 100% 98% 100% 100%  Weight:  92.5 kg    Height:        SpO2: 100 % O2 Flow Rate (L/min): 3 L/min  Wt Readings from Last 3 Encounters:  02/29/20 92.5 kg  02/26/20 97.5 kg  02/05/20 98.3 kg     Intake/Output Summary (Last 24 hours) at 02/29/2020 1147 Last data filed at 02/29/2020 0840 Gross per 24 hour  Intake 480 ml  Output --  Net 480 ml     Physical Exam  Awake Alert, No new F.N deficits, hard of hearing [name].AT,PERRAL Supple Neck,No JVD, No cervical lymphadenopathy appriciated.  Symmetrical Chest wall movement, Good air movement bilaterally, mild bilateral wheezing. iRRR,No Gallops,Rubs or  new Murmurs, No Parasternal Heave +ve B.Sounds, Abd Soft, No tenderness, No organomegaly appriciated, No rebound - guarding or rigidity. No Cyanosis, old large scab on R mid leg.      Data Review:    Recent Labs  Lab 02/28/20 1245 02/29/20 0456  WBC 10.7* 8.8  HGB 12.3* 12.1*  HCT 39.3 37.5*  PLT 259 248  MCV 91.6 91.2  MCH 28.7 29.4  MCHC 31.3 32.3  RDW 15.1 15.2  LYMPHSABS 0.7  --   MONOABS 0.9  --   EOSABS 0.0  --   BASOSABS 0.0  --     Recent Labs  Lab 02/28/20 1245 02/28/20 1709 02/29/20 0456 02/29/20 0724  NA 138  --  137  --   K 3.7  --  3.6  --   CL 101  --  100  --   CO2  25  --  28  --   GLUCOSE 139*  --  124*  --   BUN 28*  --  26*  --   CREATININE 1.56*  --  1.64*  --   CALCIUM 9.3  --  9.0  --   AST 21  --   --   --   ALT 21  --   --   --   ALKPHOS 53  --   --   --   BILITOT 1.3*  --   --   --   ALBUMIN 4.2  --   --   --   MG  --   --   --  1.9  CRP  --   --   --  14.1*  INR 1.2  --   --   --   TSH  --  1.211  --   --   HGBA1C 6.9*  --   --   --   BNP  --   --   --  218.1*    Recent Labs  Lab 02/28/20 2029 02/29/20 0724  CRP  --  14.1*  BNP  --  218.1*  SARSCOV2NAA NEGATIVE  --     ------------------------------------------------------------------------------------------------------------------ No results for input(s): CHOL, HDL, LDLCALC, TRIG, CHOLHDL, LDLDIRECT in the last 72 hours.  Lab Results  Component Value Date   HGBA1C 6.9 (H) 02/28/2020   ------------------------------------------------------------------------------------------------------------------ Recent Labs    02/28/20 1709  TSH 1.211   ------------------------------------------------------------------------------------------------------------------ No results for input(s): VITAMINB12, FOLATE, FERRITIN, TIBC, IRON, RETICCTPCT in the last 72 hours.  Coagulation profile Recent Labs  Lab 02/28/20 1245  INR 1.2    No results for input(s): DDIMER in the last 72 hours.  Cardiac Enzymes No results for input(s): CKMB, TROPONINI, MYOGLOBIN in the last 168 hours.  Invalid input(s): CK ------------------------------------------------------------------------------------------------------------------    Component Value Date/Time   BNP 218.1 (H) 02/29/2020 1601    Micro Results Recent Results (from the past 240 hour(s))  SARS Coronavirus 2 by RT PCR (hospital order, performed in [facility] hospital lab) Nasopharyngeal Nasopharyngeal Swab     Status: None   Collection Time: 02/28/20  8:29 PM   Specimen: Nasopharyngeal Swab  Result Value Ref Range Status   SARS  Coronavirus 2 NEGATIVE NEGATIVE Final    Comment: (NOTE) SARS-CoV-2 target nucleic acids are NOT DETECTED.  The SARS-CoV-2 RNA is generally detectable in upper and lower respiratory specimens during the acute phase of infection. The lowest concentration of SARS-CoV-2 viral copies this assay can detect is 250 copies / mL. A negative result does not preclude SARS-CoV-2 infection  and should not be used as the sole basis for treatment or other patient management decisions.  A negative result may occur with improper specimen collection / handling, submission of specimen other than nasopharyngeal swab, presence of viral mutation(s) within the areas targeted by this assay, and inadequate number of viral copies (<250 copies / mL). A negative result must be combined with clinical observations, patient history, and epidemiological information.  Fact Sheet for Patients:   StrictlyIdeas.no  Fact Sheet for Healthcare Providers: BankingDealers.co.za  This test is not yet approved or  cleared by the [place] FDA and has been authorized for detection and/or diagnosis of SARS-CoV-2 by FDA under an Emergency Use Authorization (EUA).  This EUA will remain in effect (meaning this test can be used) for the duration of the COVID-19 declaration under Section 564(b)(1) of the Act, 21 U.S.C. section 360bbb-3(b)(1), unless the authorization is terminated or revoked sooner.  Performed at [facility], [place] [address], [place], [place] [address]     Radiology Reports DG Chest [place] 1 View  Result Date: 02/28/2020 CLINICAL DATA:  79 year old male with shortness of breath and atrial fibrillation. EXAM: PORTABLE CHEST 1 VIEW COMPARISON:  07/17/2015 chest radiograph. FINDINGS: UPPER limits normal heart size again noted. Mild pulmonary vascular congestion is present. A large hiatal hernia is again identified. There is no evidence of focal airspace disease,  pulmonary edema, suspicious pulmonary nodule/mass, pleural effusion, or pneumothorax. No acute bony abnormalities are identified. IMPRESSION: 1. Mild pulmonary vascular congestion. 2. Large hiatal hernia. Electronically Signed   By: Margarette Canada M.D.   On: 02/28/2020 13:33   DG Foot 2 Views Left  Result Date: 02/28/2020 CLINICAL DATA:  LEFT foot pain. No known injury. Initial encounter. EXAM: LEFT FOOT - 2 VIEW COMPARISON:  None. FINDINGS: No acute fracture, subluxation or dislocation identified. Mild degenerative changes in the midfoot and 1st MTP joint noted. Diffuse soft tissue swelling is noted. No focal bony lesions are present. IMPRESSION: 1. Diffuse soft tissue swelling without acute bony abnormality. Electronically Signed   By: Margarette Canada M.D.   On: 02/28/2020 19:04   ECHOCARDIOGRAM COMPLETE  Result Date: 02/26/2020    ECHOCARDIOGRAM REPORT   Patient Name:   Renwick Asman. Date of Exam: 02/26/2020 Medical Rec #:  [id number]          Height:       66.0 in Accession #:    [id number]         Weight:       215.0 lb Date of Birth:  07-15-1941         BSA:          2.062 m Patient Age:    88 years           BP:           149/106 mmHg Patient Gender: M                  HR:           91 bpm. Exam Location:  [place] Procedure: 2D Echo, Cardiac Doppler and Color Doppler Indications:    I48.0 Atrial Fibrillation  History:        Patient has prior history of Echocardiogram examinations, most                 recent 04/15/2015. Risk Factors:Hypertension and HLD, CKD.  Sonographer:    Marygrace Drought RCS Referring Phys: [place]  1.  Left ventricular ejection fraction, by estimation, is 55 to 60%. The left ventricle has normal function. The left ventricle has no regional wall motion abnormalities. There is moderate concentric left ventricular hypertrophy. Left ventricular diastolic parameters are indeterminate.  2. Right ventricular systolic function is normal. The right ventricular size  is normal. There is moderately elevated pulmonary artery systolic pressure.  3. Left atrial size was severely dilated.  4. The mitral valve is normal in structure. Mild mitral valve regurgitation. No evidence of mitral stenosis.  5. The aortic valve is tricuspid. Aortic valve regurgitation is mild. No aortic stenosis is present.  6. The inferior vena cava is normal in size with greater than 50% respiratory variability, suggesting right atrial pressure of 3 mmHg. FINDINGS  Left Ventricle: Left ventricular ejection fraction, by estimation, is 55 to 60%. The left ventricle has normal function. The left ventricle has no regional wall motion abnormalities. The left ventricular internal cavity size was normal in size. There is  moderate concentric left ventricular hypertrophy. Left ventricular diastolic parameters are indeterminate. Indeterminate filling pressures. Right Ventricle: The right ventricular size is normal. No increase in right ventricular wall thickness. Right ventricular systolic function is normal. There is moderately elevated pulmonary artery systolic pressure. The tricuspid regurgitant velocity is 3.42 m/s, and with an assumed right atrial pressure of 3 mmHg, the estimated right ventricular systolic pressure is 96.2 mmHg. Left Atrium: Left atrial size was severely dilated. Right Atrium: Right atrial size was normal in size. Pericardium: There is no evidence of pericardial effusion. Mitral Valve: The mitral valve is normal in structure. Normal mobility of the mitral valve leaflets. Mild mitral valve regurgitation. No evidence of mitral valve stenosis. Tricuspid Valve: The tricuspid valve is normal in structure. Tricuspid valve regurgitation is trivial. No evidence of tricuspid stenosis. Aortic Valve: The aortic valve is tricuspid. Aortic valve regurgitation is mild. Aortic regurgitation PHT measures 579 msec. No aortic stenosis is present. Pulmonic Valve: The pulmonic valve was normal in structure.  Pulmonic valve regurgitation is not visualized. No evidence of pulmonic stenosis. Aorta: The aortic root is normal in size and structure. Venous: The inferior vena cava is normal in size with greater than 50% respiratory variability, suggesting right atrial pressure of 3 mmHg. IAS/Shunts: No atrial level shunt detected by color flow Doppler.  LEFT VENTRICLE PLAX 2D LVIDd:         4.09 cm  Diastology LVIDs:         2.83 cm  LV e' lateral:   11.60 cm/s LV PW:         1.61 cm  LV E/e' lateral: 8.3 LV IVS:        1.39 cm  LV e' medial:    8.05 cm/s LVOT diam:     2.20 cm  LV E/e' medial:  11.9 LV SV:         66 LV SV Index:   32 LVOT Area:     3.80 cm  RIGHT VENTRICLE RV Basal diam:  3.73 cm RV S prime:     15.00 cm/s TAPSE (M-mode): 1.7 cm RVSP:           49.8 mmHg LEFT ATRIUM             Index       RIGHT ATRIUM           Index LA diam:        4.40 cm 2.13 cm/m  RA Pressure: 3.00 mmHg LA Vol (A2C):   71.3  ml 34.57 ml/m RA Area:     20.00 cm LA Vol (A4C):   84.5 ml 40.98 ml/m RA Volume:   58.90 ml  28.56 ml/m LA Biplane Vol: 81.5 ml 39.52 ml/m  AORTIC VALVE LVOT Vmax:   80.80 cm/s LVOT Vmean:  58.033 cm/s LVOT VTI:    0.173 m AI PHT:      579 msec  AORTA Ao Root diam: 3.50 cm MITRAL VALVE               TRICUSPID VALVE MV Area (PHT):             TR Peak grad:   46.8 mmHg MV Decel Time: 151 msec    TR Vmax:        342.00 cm/s MV E velocity: 96.10 cm/s  Estimated RAP:  3.00 mmHg                            RVSP:           49.8 mmHg                             SHUNTS                            Systemic VTI:  0.17 m                            Systemic Diam: 2.20 cm Skeet Latch MD Electronically signed by Skeet Latch MD Signature Date/Time: 02/26/2020/9:49:59 PM    Final     Time Spent in minutes  30   Lala Lund M.D on 02/29/2020 at 11:47 AM  To page go to [email or web address] - password [facility]

## 2020-02-29 NOTE — Progress Notes (Signed)
OT Cancellation Note  Patient Details Name: [name] MRN: [id number] DOB: [birth year]   Cancelled Treatment:    Reason Eval/Treat Not Completed: Medical issues which prohibited therapy. MD and RN deferred secondary to patient with HR of 110-120 at rest. OT will continue efforts toward completion of evaluation when patient is medically stable.   Meagon Duskin R H. 02/29/2020, 10:20 AM

## 2020-02-29 NOTE — Care Management (Signed)
1552 02-29-20 Case Manager received a referral for Xarelto-Case Manager provided the patient with the 30 day free card. Benefits check in process for cost. Case Manager will follow for cost. Graves-Bigelow, Ocie Cornfield, RN,BSN

## 2020-03-01 ENCOUNTER — Inpatient Hospital Stay (HOSPITAL_COMMUNITY): Payer: Medicare Other

## 2020-03-01 DIAGNOSIS — R55 Syncope and collapse: Secondary | ICD-10-CM

## 2020-03-01 LAB — CBC
HCT: 36.9 % — ABNORMAL LOW (ref 39.0–52.0)
Hemoglobin: 11.6 g/dL — ABNORMAL LOW (ref 13.0–17.0)
MCH: 28.9 pg (ref 26.0–34.0)
MCHC: 31.4 g/dL (ref 30.0–36.0)
MCV: 92 fL (ref 80.0–100.0)
Platelets: 253 10*3/uL (ref 150–400)
RBC: 4.01 MIL/uL — ABNORMAL LOW (ref 4.22–5.81)
RDW: 14.9 % (ref 11.5–15.5)
WBC: 11.5 10*3/uL — ABNORMAL HIGH (ref 4.0–10.5)
nRBC: 0 % (ref 0.0–0.2)

## 2020-03-01 LAB — BASIC METABOLIC PANEL
Anion gap: 11 (ref 5–15)
BUN: 36 mg/dL — ABNORMAL HIGH (ref 8–23)
CO2: 26 mmol/L (ref 22–32)
Calcium: 8.8 mg/dL — ABNORMAL LOW (ref 8.9–10.3)
Chloride: 100 mmol/L (ref 98–111)
Creatinine, Ser: 1.75 mg/dL — ABNORMAL HIGH (ref 0.61–1.24)
GFR calc Af Amer: 43 mL/min — ABNORMAL LOW (ref >60)
GFR calc non Af Amer: 37 mL/min — ABNORMAL LOW (ref >60)
Glucose, Bld: 161 mg/dL — ABNORMAL HIGH (ref 70–99)
Potassium: 4.1 mmol/L (ref 3.5–5.1)
Sodium: 137 mmol/L (ref 135–145)

## 2020-03-01 LAB — C-REACTIVE PROTEIN: CRP: 17.1 mg/dL — ABNORMAL HIGH (ref <1.0)

## 2020-03-01 LAB — MAGNESIUM: Magnesium: 2 mg/dL (ref 1.7–2.4)

## 2020-03-01 LAB — BRAIN NATRIURETIC PEPTIDE: B Natriuretic Peptide: 275.6 pg/mL — ABNORMAL HIGH (ref 0.0–100.0)

## 2020-03-01 MED ORDER — METHYLPREDNISOLONE SODIUM SUCC 40 MG IJ SOLR
20.0000 mg | INTRAMUSCULAR | Status: DC
  Administered 2020-03-01: 20 mg via INTRAVENOUS
  Filled 2020-03-01: qty 1

## 2020-03-01 MED ORDER — RIVAROXABAN 15 MG PO TABS
15.0000 mg | ORAL_TABLET | Freq: Every day | ORAL | Status: DC
  Administered 2020-03-01: 15 mg via ORAL
  Filled 2020-03-01: qty 1

## 2020-03-01 MED ORDER — LISINOPRIL 5 MG PO TABS
5.0000 mg | ORAL_TABLET | Freq: Every day | ORAL | Status: DC
  Administered 2020-03-02: 5 mg via ORAL
  Filled 2020-03-01: qty 1

## 2020-03-01 NOTE — Progress Notes (Signed)
ANTICOAGULATION CONSULT NOTE  Pharmacy Consult for heparin Indication: atrial fibrillation  No Known Allergies  Patient Measurements: Height: 5\' 6"  (167.6 cm) Weight: 92.2 kg (203 lb 4.8 oz) IBW/kg (Calculated) : 63.8  Vital Signs: Temp: 97.9 F (36.6 C) (06/12 0800) Temp Source: Oral (06/12 0800) BP: 123/87 (06/12 0800) Pulse Rate: 97 (06/12 0800)  Labs: Recent Labs    02/28/20 1245 02/28/20 1245 02/28/20 1522 02/28/20 2234 02/29/20 0456 03/01/20 0358  HGB 12.3*   < >  --   --  12.1* 11.6*  HCT 39.3  --   --   --  37.5* 36.9*  PLT 259  --   --   --  248 253  LABPROT 14.3  --   --   --   --   --   INR 1.2  --   --   --   --   --   HEPARINUNFRC  --   --   --  0.49 0.39  --   CREATININE 1.56*  --   --   --  1.64* 1.75*  TROPONINIHS 19*  --  24*  --   --   --    < > = values in this interval not displayed.    Estimated Creatinine Clearance: 38.2 mL/min (A) (by C-G formula based on SCr of 1.75 mg/dL (H)).   Medical History: Past Medical History:  Diagnosis Date  . Benign localized hyperplasia of prostate with urinary obstruction 07/21/2015  . CAP (community acquired pneumonia) 04/14/2015  . Carotid stenosis    left  . CKD (chronic kidney disease) stage 4, GFR 15-29 ml/min (HCC) 04/14/2015  . Complication of anesthesia   . Elevated troponin 04/14/2015  . Essential hypertension 04/14/2015  . Gram-negative bacteremia 04/15/2015  . Headache    migraines  . Hearing loss   . Hematuria 04/19/2015  . HLD (hyperlipidemia)   . Normocytic hypochromic anemia 04/14/2015  . PAF (paroxysmal atrial fibrillation) ([place]) 04/14/2015  . PONV (postoperative nausea and vomiting)   . Sepsis secondary to UTI ([place]) 04/23/2015  . Transfusion history    last 04-24-15  . Urinary retention     Assessment: 79 year old male with known AFib admitted with AFib RVR. Patient non-compliant with Eliquis at home. Patient transitioned to Xarelto 20 mg 6/12 with CrCl 50 mg/min based on TBW.  Today,  CrCl (TBW) is 47 mg/min. Will renally adjust Xarelto. Hemoglobin decreased overnight without any bleeding reported.   Goal of Therapy:  Heparin level 0.3-0.7 units/ml Monitor platelets by anticoagulation protocol: Yes   Plan:  - Decrease Xarelto to 15 mg daily with supper - Monitor for signs and symptoms of bleeding      Aundre Hietala L. Devin Going, [facility] PGY1 Pharmacy Resident [phone] 03/01/20      11:13 AM  Please check AMION for all [facility] phone numbers After 10:00 PM, call the [facility] [phone]

## 2020-03-01 NOTE — Evaluation (Signed)
Physical Therapy Evaluation Patient Details Name: [name] MRN: [id number] DOB: [birth year] Today's Date: 03/01/2020   History of Present Illness  79 y.o. male with medical history significant of afib, noncompliant on Eliquis; HLD; HTN; stage 4 CKD; BPH: and carotid stenosis presenting with syncope, in rapid afib RVR.  Clinical Impression  Pt admitted with above diagnosis. From previous notes pt doing much better today although still presents with highly antalgic gait pattern relying RW for support for comfort and stability. HOH and requires some cues for safety awareness. Pt wife present during evaluation and confident they can manage safely at home with some HHPT follow-up. Pt able to ambulate up to 75 today with min guard assist, no loss of balance. SpO2 diminished to 87-88% on room air, improves to 97% on 1L supplemental O2. Pt currently with functional limitations due to the deficits listed below (see PT Problem List). Pt will benefit from skilled PT to increase their independence and safety with mobility to allow discharge to the venue listed below.       Follow Up Recommendations Home health PT;Supervision/Assistance - 24 hour    Equipment Recommendations  None recommended by PT    Recommendations for Other Services       Precautions / Restrictions Precautions Precautions: Fall Precaution Comments: monitor O2 Restrictions Weight Bearing Restrictions: No      Mobility  Bed Mobility Overal bed mobility: Modified Independent Bed Mobility: Supine to Sit     Supine to sit: Supervision     General bed mobility comments: supervision for safety, cues and assist for line/lead awareness.  Transfers Overall transfer level: Needs assistance Equipment used: Rolling walker (2 wheeled) Transfers: Sit to/from Stand Sit to Stand: Supervision         General transfer comment: Supervision for safety, cues for hand placement. Required forward rocking to stand from bed.  Light use of RW for support once upright.  Ambulation/Gait Ambulation/Gait assistance: Min guard Gait Distance (Feet): 75 Feet Assistive device: Rolling walker (2 wheeled) Gait Pattern/deviations: Step-through pattern;Decreased stride length;Antalgic Gait velocity: decreased  Gait velocity interpretation: <1.8 ft/sec, indicate of risk for recurrent falls General Gait Details: Heavily antalgic gait pattern, using RW to decrease WB through BIL feet due to soreness. No overt LOB noted while utilizing RW however with short distance in room pt leaning heavily on furniture while performing toileting and oral hygiene.  No buckling of LEs.  Stairs            Wheelchair Mobility    Modified Rankin (Stroke Patients Only)       Balance Overall balance assessment: Needs assistance Sitting-balance support: No upper extremity supported;Feet supported Sitting balance-Leahy Scale: Good     Standing balance support: No upper extremity supported Standing balance-Leahy Scale: Fair                               Pertinent Vitals/Pain Pain Assessment: Faces Faces Pain Scale: Hurts little more Pain Location: Bilateral feet with movement Pain Descriptors / Indicators: Aching Pain Intervention(s): Limited activity within patient's tolerance;Monitored during session;Repositioned    Home Living Family/patient expects to be discharged to:: Private residence Living Arrangements: Spouse/significant other Available Help at Discharge: Family;Available 24 hours/day Type of Home: House Home Access: Stairs to enter Entrance Stairs-Rails: Left Entrance Stairs-Number of Steps: 2 Home Layout: Multi-level;Able to live on main level with bedroom/bathroom (toilet downstairs - bird bath down stairs) Home Equipment: Walker - 2 wheels;Shower  seat      Prior Function Level of Independence: Independent         Comments: Patient was independent with BADLs/IADLs without device.     Hand  Dominance   Dominant Hand: Left    Extremity/Trunk Assessment   Upper Extremity Assessment Upper Extremity Assessment: Defer to OT evaluation    Lower Extremity Assessment Lower Extremity Assessment: Generalized weakness       Communication   Communication: HOH  Cognition Arousal/Alertness: Awake/alert Behavior During Therapy: WFL for tasks assessed/performed Overall Cognitive Status: Within Functional Limits for tasks assessed                                        General Comments General comments (skin integrity, edema, etc.): SpO2 while ambulating 87-88% on room air, improved to 97% with 1L supplemental O2.     Exercises     Assessment/Plan    PT Assessment Patient needs continued PT services  PT Problem List Decreased strength;Decreased activity tolerance;Decreased balance;Decreased mobility;Decreased knowledge of use of DME;Pain       PT Treatment Interventions DME instruction;Gait training;Stair training;Functional mobility training;Therapeutic activities;Therapeutic exercise;Balance training;Neuromuscular re-education;Patient/family education    PT Goals (Current goals can be found in the Care Plan section)  Acute Rehab PT Goals Patient Stated Goal: To return home PT Goal Formulation: With patient/family Time For Goal Achievement: 03/15/20 Potential to Achieve Goals: Good    Frequency Min 3X/week   Barriers to discharge  (Limited home environment) Main bath upstairs however wife states they can bath downstairs at sink. toilet downstairs available.    Co-evaluation               AM-PAC PT "6 Clicks" Mobility  Outcome Measure Help needed turning from your back to your side while in a flat bed without using bedrails?: None Help needed moving from lying on your back to sitting on the side of a flat bed without using bedrails?: None Help needed moving to and from a bed to a chair (including a wheelchair)?: A Little Help needed standing  up from a chair using your arms (e.g., wheelchair or bedside chair)?: A Little Help needed to walk in hospital room?: A Little Help needed climbing 3-5 steps with a railing? : A Lot 6 Click Score: 19    End of Session Equipment Utilized During Treatment: Gait belt;Oxygen Activity Tolerance: Patient tolerated treatment well Patient left: Other (comment) (Commode with Nursing and family in room.) Nurse Communication: Mobility status PT Visit Diagnosis: Other abnormalities of gait and mobility (R26.89);Unsteadiness on feet (R26.81);Muscle weakness (generalized) (M62.81);Difficulty in walking, not elsewhere classified (R26.2);Pain Pain - Right/Left: Right Pain - part of body:  (BIL feet)    Time: 1607-3710 PT Time Calculation (min) (ACUTE ONLY): 38 min   Charges:   PT Evaluation $PT Eval Low Complexity: 1 Low PT Treatments $Gait Training: 8-22 mins $Therapeutic Activity: 8-22 mins        [employer], PT   Ellouise Newer 03/01/2020, 1:48 PM

## 2020-03-01 NOTE — Progress Notes (Signed)
PT Cancellation Note  Patient Details Name: [name] MRN: [id number] DOB: [birth year]   Cancelled Treatment:    Reason Eval/Treat Not Completed: Patient at procedure or test/unavailable  Pt off unit for testing. Will follow-up later today for comprehensive PT evaluation.  Ellouise Newer 03/01/2020, 10:04 AM    Elayne Snare, PT [phone]

## 2020-03-01 NOTE — Progress Notes (Signed)
SATURATION QUALIFICATIONS: (This note is used to comply with regulatory documentation for home oxygen)  Patient Saturations on Room Air at Rest = 99%  Patient Saturations on Room Air while Ambulating = 87-88%  Patient Saturations on 1 Liters of oxygen while Ambulating = 97%  Oxygen saturation decreases with moderate DOE while ambulating without supplemental O2. Improves rapidly with seated rest.

## 2020-03-01 NOTE — Plan of Care (Signed)
?  Problem: Clinical Measurements: ?Goal: Respiratory complications will improve ?Outcome: Progressing ?Goal: Cardiovascular complication will be avoided ?Outcome: Progressing ?  ?Problem: Activity: ?Goal: Risk for activity intolerance will decrease ?Outcome: Progressing ?  ?Problem: Pain Managment: ?Goal: General experience of comfort will improve ?Outcome: Progressing ?  ?Problem: Safety: ?Goal: Ability to remain free from injury will improve ?Outcome: Progressing ?  ?

## 2020-03-01 NOTE — Progress Notes (Signed)
VASCULAR LAB PRELIMINARY  PRELIMINARY  PRELIMINARY  PRELIMINARY  Carotid duplex completed.    Preliminary report:  See CV proc for preliminary results.  Carrissa Taitano, RVT 03/01/2020, 9:24 AM

## 2020-03-01 NOTE — Progress Notes (Signed)
PROGRESS NOTE                                                                                                                                                                                                             Patient Demographics:    Pinchos Topel, is a 79 y.o. male, DOB - [birth year], ZOX:[id number]  Admit date - 02/28/2020   Admitting Physician Karmen Bongo, MD  Outpatient Primary MD for the patient is [name], MD  LOS - 1  Chief Complaint  Patient presents with  . Atrial Fibrillation       Brief Narrative -  Maisen W Finnick Orosz. is a 79 y.o. male with medical history significant of afib, noncompliant on Eliquis; HLD; HTN; stage 4 CKD; BPH: and carotid stenosis presenting with syncope, in rapid afib.  His wife reports a problem with his left foot, ?gout.  He couldn't walk so he got into the floor and wasn't able to get up, the ER he was diagnosed with left foot gout along with A. fib RVR and admit to the hospital.   Subjective:   Patient in bed, appears comfortable, denies any headache, no fever, no chest pain or pressure, no shortness of breath , no abdominal pain. No focal weakness, left foot first toe pain better.   Assessment  & Plan :     1.  Paroxysmal atrial fibrillation with RVR [place] vas 2 score of greater than 4.  Recent echo noted with preserved EF, stable TSH, he was initially on diltiazem drip which has been titrated off, Toprol dose has been increased, he also received 2 doses of IV digoxin, currently on Xarelto for anticoagulation, CM to provide 1 month coupon has been ordered.  Still heart rate at times running above 110, will monitor and titrate medications for another 24 hours goal heart rate will be to keep him under 100.  2.  Left foot gout with elevated uric acid levels.  Improved on colchicine and IV steroids, PT eval to see if he can bear weight and ambulate, monitor.  3.  Hypertension.  On high-dose Toprol-XL, lisinopril dose dropped to  adjust for Toprol-XL.  4.  Mild wheezing.  Likely acute on chronic diastolic CHF-EF 81%.  Brought on by RVR.  Resolved after IV Lasix.  5.  Noted patient to have dyslipidemia, BPH, carotid artery disease, hearing loss.  Does not take medications or wear hearing aids per wife.  6.CKD 4 - baseline creat around 2, monitor.  Condition -  Fair  Family Communication  : Wife Inez Catalina [phone] on 02/29/2020, 03/01/2020  Code Status : DNR  Consults  :  None  Procedures  :    TTE 02-2020 -   1. Left ventricular ejection fraction, by estimation, is 55 to 60%. The left ventricle has normal function. The left ventricle has no regional wall motion abnormalities. There is moderate concentric left ventricular hypertrophy. Left ventricular diastolic parameters are indeterminate.  2. Right ventricular systolic function is normal. The right ventricular size is normal. There is moderately elevated pulmonary artery systolic pressure.  3. Left atrial size was severely dilated.  4. The mitral valve is normal in structure. Mild mitral valve regurgitation. No evidence of mitral stenosis.  5. The aortic valve is tricuspid. Aortic valve regurgitation is mild. No aortic stenosis is present.  6. The inferior vena cava is normal in size with greater than 50% respiratory variability, suggesting right atrial pressure of 3 mmHg.   PUD Prophylaxis : PPI  Disposition Plan  :    Status is: Inpt  Dispo: The patient is from: Home              Anticipated d/c is to: Home              Anticipated d/c date is: 2 days              Patient currently is not medically stable to d/c.  A. fib RVR on IV Cardizem drip.  DVT Prophylaxis  :   Heparin >> Xarelto  Lab Results  Component Value Date   PLT 253 03/01/2020    Diet :  Diet Order            Diet Heart Room service appropriate? Yes; Fluid consistency: Thin  Diet effective now                  Inpatient Medications Scheduled Meds: . colchicine   0.6 mg Oral Daily  . [START ON 03/02/2020] lisinopril  5 mg Oral Daily  . methylPREDNISolone (SOLU-MEDROL) injection  20 mg Intravenous Q24H  . metoprolol succinate  100 mg Oral Daily  . pantoprazole  40 mg Oral Daily  . rivaroxaban  20 mg Oral Q supper  . sodium chloride flush  3 mL Intravenous Q12H   Continuous Infusions:  PRN Meds:.acetaminophen **OR** [DISCONTINUED] acetaminophen, diltiazem, [DISCONTINUED] ondansetron **OR** ondansetron (ZOFRAN) IV  Antibiotics  :   Anti-infectives (From admission, onward)   None         Objective:   Vitals:   02/29/20 2014 02/29/20 2358 03/01/20 0612 03/01/20 0800  BP: 126/75 (!) 115/59 (!) 130/92 123/87  Pulse: 68 75 (!) 59 97  Resp: 20 20 20 18   Temp: 98.3 F (36.8 C)  97.8 F (36.6 C) 97.9 F (36.6 C)  TempSrc: Oral  Oral Oral  SpO2: 99% 93%  100%  Weight:   92.2 kg   Height:        SpO2: 100 % O2 Flow Rate (L/min): 3 L/min  Wt Readings from Last 3 Encounters:  03/01/20 92.2 kg  02/26/20 97.5 kg  02/05/20 98.3 kg     Intake/Output Summary (Last 24 hours) at 03/01/2020 1054 Last data filed at 03/01/2020 0802 Gross per 24 hour  Intake 671.83 ml  Output --  Net 671.83 ml     Physical Exam  Awake Alert, No new F.N deficits, hard of hearing [place].AT,PERRAL Supple Neck,No JVD, No cervical lymphadenopathy appriciated.  Symmetrical Chest wall  movement, Good air movement bilaterally, CTAB iRRR,No Gallops, Rubs or new Murmurs, No Parasternal Heave +ve B.Sounds, Abd Soft, No tenderness, No organomegaly appriciated, No rebound - guarding or rigidity. No Cyanosis,  old large scab on R mid leg, L.1st toe mildly tender     Data Review:    Recent Labs  Lab 02/28/20 1245 02/29/20 0456 03/01/20 0358  WBC 10.7* 8.8 11.5*  HGB 12.3* 12.1* 11.6*  HCT 39.3 37.5* 36.9*  PLT 259 248 253  MCV 91.6 91.2 92.0  MCH 28.7 29.4 28.9  MCHC 31.3 32.3 31.4  RDW 15.1 15.2 14.9  LYMPHSABS 0.7  --   --   MONOABS 0.9  --   --   EOSABS  0.0  --   --   BASOSABS 0.0  --   --     Recent Labs  Lab 02/28/20 1245 02/28/20 1709 02/29/20 0456 02/29/20 0724 03/01/20 0358  NA 138  --  137  --  137  K 3.7  --  3.6  --  4.1  CL 101  --  100  --  100  CO2 25  --  28  --  26  GLUCOSE 139*  --  124*  --  161*  BUN 28*  --  26*  --  36*  CREATININE 1.56*  --  1.64*  --  1.75*  CALCIUM 9.3  --  9.0  --  8.8*  AST 21  --   --   --   --   ALT 21  --   --   --   --   ALKPHOS 53  --   --   --   --   BILITOT 1.3*  --   --   --   --   ALBUMIN 4.2  --   --   --   --   MG  --   --   --  1.9 2.0  CRP  --   --   --  14.1* 17.1*  INR 1.2  --   --   --   --   TSH  --  1.211  --   --   --   HGBA1C 6.9*  --   --   --   --   BNP  --   --   --  218.1* 275.6*    Recent Labs  Lab 02/28/20 2029 02/29/20 0724 03/01/20 0358  CRP  --  14.1* 17.1*  BNP  --  218.1* 275.6*  SARSCOV2NAA NEGATIVE  --   --     ------------------------------------------------------------------------------------------------------------------ No results for input(s): CHOL, HDL, LDLCALC, TRIG, CHOLHDL, LDLDIRECT in the last 72 hours.  Lab Results  Component Value Date   HGBA1C 6.9 (H) 02/28/2020   ------------------------------------------------------------------------------------------------------------------ Recent Labs    02/28/20 1709  TSH 1.211   ------------------------------------------------------------------------------------------------------------------ No results for input(s): VITAMINB12, FOLATE, FERRITIN, TIBC, IRON, RETICCTPCT in the last 72 hours.  Coagulation profile Recent Labs  Lab 02/28/20 1245  INR 1.2    No results for input(s): DDIMER in the last 72 hours.  Cardiac Enzymes No results for input(s): CKMB, TROPONINI, MYOGLOBIN in the last 168 hours.  Invalid input(s): CK ------------------------------------------------------------------------------------------------------------------    Component Value Date/Time   BNP  275.6 (H) 03/01/2020 4709    Micro Results Recent Results (from the past 240 hour(s))  SARS Coronavirus 2 by RT PCR (hospital order, performed in [facility] hospital lab) Nasopharyngeal Nasopharyngeal Swab     Status: None  Collection Time: 02/28/20  8:29 PM   Specimen: Nasopharyngeal Swab  Result Value Ref Range Status   SARS Coronavirus 2 NEGATIVE NEGATIVE Final    Comment: (NOTE) SARS-CoV-2 target nucleic acids are NOT DETECTED.  The SARS-CoV-2 RNA is generally detectable in upper and lower respiratory specimens during the acute phase of infection. The lowest concentration of SARS-CoV-2 viral copies this assay can detect is 250 copies / mL. A negative result does not preclude SARS-CoV-2 infection and should not be used as the sole basis for treatment or other patient management decisions.  A negative result may occur with improper specimen collection / handling, submission of specimen other than nasopharyngeal swab, presence of viral mutation(s) within the areas targeted by this assay, and inadequate number of viral copies (<250 copies / mL). A negative result must be combined with clinical observations, patient history, and epidemiological information.  Fact Sheet for Patients:   StrictlyIdeas.no  Fact Sheet for Healthcare Providers: BankingDealers.co.za  This test is not yet approved or  cleared by the [place] FDA and has been authorized for detection and/or diagnosis of SARS-CoV-2 by FDA under an Emergency Use Authorization (EUA).  This EUA will remain in effect (meaning this test can be used) for the duration of the COVID-19 declaration under Section 564(b)(1) of the Act, 21 U.S.C. section 360bbb-3(b)(1), unless the authorization is terminated or revoked sooner.  Performed at [facility], [place] [address], [place], [place] [address]     Radiology Reports DG Chest [place] 1 View  Result Date:  02/28/2020 CLINICAL DATA:  79 year old male with shortness of breath and atrial fibrillation. EXAM: PORTABLE CHEST 1 VIEW COMPARISON:  07/17/2015 chest radiograph. FINDINGS: UPPER limits normal heart size again noted. Mild pulmonary vascular congestion is present. A large hiatal hernia is again identified. There is no evidence of focal airspace disease, pulmonary edema, suspicious pulmonary nodule/mass, pleural effusion, or pneumothorax. No acute bony abnormalities are identified. IMPRESSION: 1. Mild pulmonary vascular congestion. 2. Large hiatal hernia. Electronically Signed   By: Margarette Canada M.D.   On: 02/28/2020 13:33   DG Foot 2 Views Left  Result Date: 02/28/2020 CLINICAL DATA:  LEFT foot pain. No known injury. Initial encounter. EXAM: LEFT FOOT - 2 VIEW COMPARISON:  None. FINDINGS: No acute fracture, subluxation or dislocation identified. Mild degenerative changes in the midfoot and 1st MTP joint noted. Diffuse soft tissue swelling is noted. No focal bony lesions are present. IMPRESSION: 1. Diffuse soft tissue swelling without acute bony abnormality. Electronically Signed   By: Margarette Canada M.D.   On: 02/28/2020 19:04   ECHOCARDIOGRAM COMPLETE  Result Date: 02/26/2020    ECHOCARDIOGRAM REPORT   Patient Name:   Fahd Galea. Date of Exam: 02/26/2020 Medical Rec #:  [id number]          Height:       66.0 in Accession #:    [id number]         Weight:       215.0 lb Date of Birth:  04-05-1941         BSA:          2.062 m Patient Age:    55 years           BP:           149/106 mmHg Patient Gender: M                  HR:  91 bpm. Exam Location:  [address] Procedure: 2D Echo, Cardiac Doppler and Color Doppler Indications:    I48.0 Atrial Fibrillation  History:        Patient has prior history of Echocardiogram examinations, most                 recent 04/15/2015. Risk Factors:Hypertension and HLD, CKD.  Sonographer:    Marygrace Drought RCS Referring Phys: [place]  1. Left  ventricular ejection fraction, by estimation, is 55 to 60%. The left ventricle has normal function. The left ventricle has no regional wall motion abnormalities. There is moderate concentric left ventricular hypertrophy. Left ventricular diastolic parameters are indeterminate.  2. Right ventricular systolic function is normal. The right ventricular size is normal. There is moderately elevated pulmonary artery systolic pressure.  3. Left atrial size was severely dilated.  4. The mitral valve is normal in structure. Mild mitral valve regurgitation. No evidence of mitral stenosis.  5. The aortic valve is tricuspid. Aortic valve regurgitation is mild. No aortic stenosis is present.  6. The inferior vena cava is normal in size with greater than 50% respiratory variability, suggesting right atrial pressure of 3 mmHg. FINDINGS  Left Ventricle: Left ventricular ejection fraction, by estimation, is 55 to 60%. The left ventricle has normal function. The left ventricle has no regional wall motion abnormalities. The left ventricular internal cavity size was normal in size. There is  moderate concentric left ventricular hypertrophy. Left ventricular diastolic parameters are indeterminate. Indeterminate filling pressures. Right Ventricle: The right ventricular size is normal. No increase in right ventricular wall thickness. Right ventricular systolic function is normal. There is moderately elevated pulmonary artery systolic pressure. The tricuspid regurgitant velocity is 3.42 m/s, and with an assumed right atrial pressure of 3 mmHg, the estimated right ventricular systolic pressure is 86.7 mmHg. Left Atrium: Left atrial size was severely dilated. Right Atrium: Right atrial size was normal in size. Pericardium: There is no evidence of pericardial effusion. Mitral Valve: The mitral valve is normal in structure. Normal mobility of the mitral valve leaflets. Mild mitral valve regurgitation. No evidence of mitral valve stenosis.  Tricuspid Valve: The tricuspid valve is normal in structure. Tricuspid valve regurgitation is trivial. No evidence of tricuspid stenosis. Aortic Valve: The aortic valve is tricuspid. Aortic valve regurgitation is mild. Aortic regurgitation PHT measures 579 msec. No aortic stenosis is present. Pulmonic Valve: The pulmonic valve was normal in structure. Pulmonic valve regurgitation is not visualized. No evidence of pulmonic stenosis. Aorta: The aortic root is normal in size and structure. Venous: The inferior vena cava is normal in size with greater than 50% respiratory variability, suggesting right atrial pressure of 3 mmHg. IAS/Shunts: No atrial level shunt detected by color flow Doppler.  LEFT VENTRICLE PLAX 2D LVIDd:         4.09 cm  Diastology LVIDs:         2.83 cm  LV e' lateral:   11.60 cm/s LV PW:         1.61 cm  LV E/e' lateral: 8.3 LV IVS:        1.39 cm  LV e' medial:    8.05 cm/s LVOT diam:     2.20 cm  LV E/e' medial:  11.9 LV SV:         66 LV SV Index:   32 LVOT Area:     3.80 cm  RIGHT VENTRICLE RV Basal diam:  3.73 cm RV S prime:  15.00 cm/s TAPSE (M-mode): 1.7 cm RVSP:           49.8 mmHg LEFT ATRIUM             Index       RIGHT ATRIUM           Index LA diam:        4.40 cm 2.13 cm/m  RA Pressure: 3.00 mmHg LA Vol (A2C):   71.3 ml 34.57 ml/m RA Area:     20.00 cm LA Vol (A4C):   84.5 ml 40.98 ml/m RA Volume:   58.90 ml  28.56 ml/m LA Biplane Vol: 81.5 ml 39.52 ml/m  AORTIC VALVE LVOT Vmax:   80.80 cm/s LVOT Vmean:  58.033 cm/s LVOT VTI:    0.173 m AI PHT:      579 msec  AORTA Ao Root diam: 3.50 cm MITRAL VALVE               TRICUSPID VALVE MV Area (PHT):             TR Peak grad:   46.8 mmHg MV Decel Time: 151 msec    TR Vmax:        342.00 cm/s MV E velocity: 96.10 cm/s  Estimated RAP:  3.00 mmHg                            RVSP:           49.8 mmHg                             SHUNTS                            Systemic VTI:  0.17 m                            Systemic Diam: 2.20 cm  Skeet Latch MD Electronically signed by Skeet Latch MD Signature Date/Time: 02/26/2020/9:49:59 PM    Final    VAS US CAROTID  Result Date: 03/01/2020 Carotid Arterial Duplex Study Indications:      Syncope and Rapid atrial fibrillation. Risk Factors:     Hypertension, hyperlipidemia. Other Factors:    Non compliant on Eliquis, CKD IV. Limitations       Today's exam was limited due to Breathing interference,                   arrythmia. Comparison Study: Prior study from 07/27/16 is available for comparison Performing Technologist: Sharion Dove RVS  Examination Guidelines: A complete evaluation includes B-mode imaging, spectral Doppler, color Doppler, and power Doppler as needed of all accessible portions of each vessel. Bilateral testing is considered an integral part of a complete examination. Limited examinations for reoccurring indications may be performed as noted.  Right Carotid Findings: +----------+--------+--------+--------+------------------+------------------+           PSV cm/sEDV cm/sStenosisPlaque DescriptionComments           +----------+--------+--------+--------+------------------+------------------+ CCA Prox  58      16                                intimal thickening +----------+--------+--------+--------+------------------+------------------+ CCA Distal65      21  intimal thickening +----------+--------+--------+--------+------------------+------------------+ ICA Prox  66      28      1-39%   heterogenous      Shadowing          +----------+--------+--------+--------+------------------+------------------+ ICA Distal70      29                                                   +----------+--------+--------+--------+------------------+------------------+ ECA       133     23                                                   +----------+--------+--------+--------+------------------+------------------+  +----------+--------+-------+--------+-------------------+           PSV cm/sEDV cmsDescribeArm Pressure (mmHG) +----------+--------+-------+--------+-------------------+ [id number]                                         +----------+--------+-------+--------+-------------------+ +---------+--------+--+--------+--+ VertebralPSV cm/s70EDV cm/s24 +---------+--------+--+--------+--+  Left Carotid Findings: +----------+--------+--------+--------+------------------+------------------+           PSV cm/sEDV cm/sStenosisPlaque DescriptionComments           +----------+--------+--------+--------+------------------+------------------+ CCA Prox  55      15                                intimal thickening +----------+--------+--------+--------+------------------+------------------+ CCA Distal55      19                                intimal thickening +----------+--------+--------+--------+------------------+------------------+ ICA Prox  75      30      1-39%   heterogenous      Shadowing          +----------+--------+--------+--------+------------------+------------------+ ICA Distal68      30                                                   +----------+--------+--------+--------+------------------+------------------+ ECA       66      12                                                   +----------+--------+--------+--------+------------------+------------------+ +----------+--------+--------+--------+-------------------+           PSV cm/sEDV cm/sDescribeArm Pressure (mmHG) +----------+--------+--------+--------+-------------------+ Subclavian109                                         +----------+--------+--------+--------+-------------------+ +---------+--------+--+--------+--+ VertebralPSV cm/s45EDV cm/s17 +---------+--------+--+--------+--+   Summary: Right Carotid: Velocities in the right ICA are consistent with a 1-39% stenosis. Left  Carotid: There is no evidence of stenosis in the left ICA. Vertebrals:  Bilateral vertebral arteries demonstrate  antegrade flow. Subclavians: Normal flow hemodynamics were seen in bilateral subclavian              arteries. *See table(s) above for measurements and observations.     Preliminary     Time Spent in minutes  30   Lala Lund M.D on 03/01/2020 at 10:54 AM  To page go to [email or web address] - password [facility]

## 2020-03-02 LAB — BASIC METABOLIC PANEL
Anion gap: 12 (ref 5–15)
BUN: 47 mg/dL — ABNORMAL HIGH (ref 8–23)
CO2: 25 mmol/L (ref 22–32)
Calcium: 8.8 mg/dL — ABNORMAL LOW (ref 8.9–10.3)
Chloride: 101 mmol/L (ref 98–111)
Creatinine, Ser: 1.51 mg/dL — ABNORMAL HIGH (ref 0.61–1.24)
GFR calc Af Amer: 51 mL/min — ABNORMAL LOW (ref >60)
GFR calc non Af Amer: 44 mL/min — ABNORMAL LOW (ref >60)
Glucose, Bld: 143 mg/dL — ABNORMAL HIGH (ref 70–99)
Potassium: 4.2 mmol/L (ref 3.5–5.1)
Sodium: 138 mmol/L (ref 135–145)

## 2020-03-02 LAB — CBC
HCT: 38.1 % — ABNORMAL LOW (ref 39.0–52.0)
Hemoglobin: 11.9 g/dL — ABNORMAL LOW (ref 13.0–17.0)
MCH: 28.5 pg (ref 26.0–34.0)
MCHC: 31.2 g/dL (ref 30.0–36.0)
MCV: 91.4 fL (ref 80.0–100.0)
Platelets: 265 10*3/uL (ref 150–400)
RBC: 4.17 MIL/uL — ABNORMAL LOW (ref 4.22–5.81)
RDW: 14.9 % (ref 11.5–15.5)
WBC: 13.3 10*3/uL — ABNORMAL HIGH (ref 4.0–10.5)
nRBC: 0 % (ref 0.0–0.2)

## 2020-03-02 LAB — BRAIN NATRIURETIC PEPTIDE: B Natriuretic Peptide: 396.1 pg/mL — ABNORMAL HIGH (ref 0.0–100.0)

## 2020-03-02 LAB — MAGNESIUM: Magnesium: 2.2 mg/dL (ref 1.7–2.4)

## 2020-03-02 LAB — C-REACTIVE PROTEIN: CRP: 8.4 mg/dL — ABNORMAL HIGH (ref <1.0)

## 2020-03-02 MED ORDER — RIVAROXABAN 15 MG PO TABS: 15.0000 mg | ORAL_TABLET | Freq: Every day | ORAL | 0 refills | Status: DC

## 2020-03-02 MED ORDER — AMLODIPINE BESYLATE 5 MG PO TABS: 5.0000 mg | ORAL_TABLET | Freq: Every day | ORAL | Status: DC

## 2020-03-02 MED ORDER — METOPROLOL SUCCINATE ER 100 MG PO TB24
100.0000 mg | ORAL_TABLET | Freq: Every day | ORAL | 0 refills | Status: DC
Start: 2020-03-02 — End: 2020-03-12

## 2020-03-02 MED ORDER — METHYLPREDNISOLONE SODIUM SUCC 40 MG IJ SOLR
40.0000 mg | Freq: Once | INTRAMUSCULAR | Status: AC
  Administered 2020-03-02: 40 mg via INTRAVENOUS
  Filled 2020-03-02: qty 1

## 2020-03-02 MED ORDER — FUROSEMIDE 40 MG PO TABS
40.0000 mg | ORAL_TABLET | Freq: Once | ORAL | Status: AC
  Administered 2020-03-02: 40 mg via ORAL
  Filled 2020-03-02: qty 1

## 2020-03-02 MED ORDER — LISINOPRIL 10 MG PO TABS: 10.0000 mg | ORAL_TABLET | Freq: Two times a day (BID) | ORAL | 0 refills | Status: DC

## 2020-03-02 MED ORDER — COLCHICINE 0.6 MG PO TABS: 0.6000 mg | ORAL_TABLET | Freq: Every day | ORAL | 0 refills | Status: DC

## 2020-03-02 MED ORDER — RIVAROXABAN 20 MG PO TABS: 20.0000 mg | ORAL_TABLET | Freq: Every day | ORAL | 0 refills | Status: DC

## 2020-03-02 MED ORDER — LISINOPRIL 10 MG PO TABS: 10.0000 mg | ORAL_TABLET | Freq: Every day | ORAL | Status: DC

## 2020-03-02 MED ORDER — RIVAROXABAN 20 MG PO TABS: 20.0000 mg | ORAL_TABLET | Freq: Every day | ORAL | Status: DC

## 2020-03-02 MED ORDER — PANTOPRAZOLE SODIUM 40 MG PO TBEC: 40.0000 mg | DELAYED_RELEASE_TABLET | Freq: Every day | ORAL | 0 refills | Status: DC

## 2020-03-02 NOTE — Progress Notes (Signed)
Patient d/c'd via wheelchair and oxygen to private vehicle in stable condition

## 2020-03-02 NOTE — Progress Notes (Signed)
ANTICOAGULATION CONSULT NOTE  Pharmacy Consult for heparin Indication: atrial fibrillation  No Known Allergies  Patient Measurements: Height: 5\' 6"  (167.6 cm) Weight: 92.2 kg (203 lb 4.8 oz) IBW/kg (Calculated) : 63.8  Vital Signs: Temp: 97.7 F (36.5 C) (06/13 0813) Temp Source: Oral (06/13 0813) BP: 140/100 (06/13 0813) Pulse Rate: 54 (06/13 0813)  Labs: Recent Labs    02/28/20 1245 02/28/20 1245 02/28/20 1522 02/28/20 2234 02/29/20 0456 02/29/20 0456 03/01/20 0358 03/02/20 0615  HGB 12.3*   < >  --   --  12.1*   < > 11.6* 11.9*  HCT 39.3   < >  --   --  37.5*  --  36.9* 38.1*  PLT 259   < >  --   --  248  --  253 265  LABPROT 14.3  --   --   --   --   --   --   --   INR 1.2  --   --   --   --   --   --   --   HEPARINUNFRC  --   --   --  0.49 0.39  --   --   --   CREATININE 1.56*   < >  --   --  1.64*  --  1.75* 1.51*  TROPONINIHS 19*  --  24*  --   --   --   --   --    < > = values in this interval not displayed.    Estimated Creatinine Clearance: 44.3 mL/min (A) (by C-G formula based on SCr of 1.51 mg/dL (H)).   Medical History: Past Medical History:  Diagnosis Date  . Benign localized hyperplasia of prostate with urinary obstruction 07/21/2015  . CAP (community acquired pneumonia) 04/14/2015  . Carotid stenosis    left  . CKD (chronic kidney disease) stage 4, GFR 15-29 ml/min (HCC) 04/14/2015  . Complication of anesthesia   . Elevated troponin 04/14/2015  . Essential hypertension 04/14/2015  . Gram-negative bacteremia 04/15/2015  . Headache    migraines  . Hearing loss   . Hematuria 04/19/2015  . HLD (hyperlipidemia)   . Normocytic hypochromic anemia 04/14/2015  . PAF (paroxysmal atrial fibrillation) ([place]) 04/14/2015  . PONV (postoperative nausea and vomiting)   . Sepsis secondary to UTI ([place]) 04/23/2015  . Transfusion history    last 04-24-15  . Urinary retention     Assessment: 79 year old male with known AFib admitted with AFib RVR. Patient  non-compliant with Eliquis at home. Patient transitioned to Xarelto 20 mg 6/12 with CrCl 50 mg/min based on TBW.  CrCl improved to 54 ml/min based on TBW and SCr of 1.51 mg/dl. Will renally adjust Xarelto. CBC stable, no bleeding noted.   Goal of Therapy:  Heparin level 0.3-0.7 units/ml Monitor platelets by anticoagulation protocol: Yes   Plan:  - Increase Xarelto to 20 mg daily with supper - Monitor for signs and symptoms of bleeding      Arya Luttrull L. Devin Going, [place] PGY1 Pharmacy Resident [phone] 03/02/20      12:37 PM  Please check AMION for all [place] phone numbers After 10:00 PM, call the [place] [phone]

## 2020-03-02 NOTE — TOC Transition Note (Signed)
Transition of Care [facility]) - CM/SW Discharge Note   Patient Details  Name: [name] MRN: [id number] Date of Birth: 1941-03-06  Transition of Care [facility]) CM/SW Contact:  Claudie Leach, RN 03/02/2020, 11:52 AM   Clinical Narrative:    Patient to d/c home with wife today.  Patient and wife agree to [facility] services and request [facility], as they have worked with patient in the past.  [facility] accepts patient referral and will start care on Tuesday or Wednesday.    Wife states patient does need RW.  RW and oxygen will be delivered to room by [facility] with [employer].    Wife expresses concern over cost of Eliquis.  She has 30 day free card.  Wife advised to inquire about patient assistance from drug company.  [facility] referral entered for copay assistance.  Wife will discuss with MD at f/u appointment.    Final next level of care: [place] Barriers to Discharge: No Barriers Identified   Patient Goals and CMS Choice Patient states their goals for this hospitalization and ongoing recovery are:: to get home CMS Medicare.gov Compare Post Acute Care list provided to:: Patient Represenative (must comment) (wife) Choice offered to / list presented to : Spouse  Discharge Placement                       Discharge Plan and Services                DME Arranged: Walker rolling, Oxygen DME Agency:  [facility]) Date DME Agency Contacted: 03/02/20 Time DME Agency Contacted: [id number] Representative spoke with at DME Agency: Brenton Grills HH Arranged: PT, RN [place] Agency: [facility] (formerly [employer]) Date [place]: 03/02/20 Time [place]: 1121 Representative spoke with at [place]: [place] ([place]) Interventions     Readmission Risk Interventions No flowsheet data found.

## 2020-03-02 NOTE — Discharge Instructions (Signed)
Follow with Primary MD [name], MD in 7 days   Get CBC, CMP, 2 view Chest X ray -  checked next visit within 1 week by Primary MD    Activity: As tolerated with Full fall precautions use walker/cane & assistance as needed  Disposition Home    Diet: Heart Healthy    Special Instructions: If you have smoked or chewed Tobacco  in the last 2 yrs please stop smoking, stop any regular Alcohol  and or any Recreational drug use.  On your next visit with your primary care physician please Get Medicines reviewed and adjusted.  Please request your Prim.MD to go over all Hospital Tests and Procedure/Radiological results at the follow up, please get all Hospital records sent to your Prim MD by signing hospital release before you go home.  If you experience worsening of your admission symptoms, develop shortness of breath, life threatening emergency, suicidal or homicidal thoughts you must seek medical attention immediately by calling 911 or calling your MD immediately  if symptoms less severe.  You Must read complete instructions/literature along with all the possible adverse reactions/side effects for all the Medicines you take and that have been prescribed to you. Take any new Medicines after you have completely understood and accpet all the possible adverse reactions/side effects.        Information on my medicine - XARELTO (Rivaroxaban)  This medication education was reviewed with me or my healthcare representative as part of my discharge preparation.  The pharmacist that spoke with me during my hospital stay was:  Einar Grad, [facility]  Why was Xarelto prescribed for you? Xarelto was prescribed for you to reduce the risk of a blood clot forming that can cause a stroke if you have a medical condition called atrial fibrillation (a type of irregular heartbeat).  What do you need to know about xarelto ? Take your Xarelto ONCE DAILY at the same time every day with your evening meal. If  you have difficulty swallowing the tablet whole, you may crush it and mix in applesauce just prior to taking your dose.  Take Xarelto exactly as prescribed by your doctor and DO NOT stop taking Xarelto without talking to the doctor who prescribed the medication.  Stopping without other stroke prevention medication to take the place of Xarelto may increase your risk of developing a clot that causes a stroke.  Refill your prescription before you run out.  After discharge, you should have regular check-up appointments with your healthcare provider that is prescribing your Xarelto.  In the future your dose may need to be changed if your kidney function or weight changes by a significant amount.  What do you do if you miss a dose? If you are taking Xarelto ONCE DAILY and you miss a dose, take it as soon as you remember on the same day then continue your regularly scheduled once daily regimen the next day. Do not take two doses of Xarelto at the same time or on the same day.   Important Safety Information A possible side effect of Xarelto is bleeding. You should call your healthcare provider right away if you experience any of the following: ? Bleeding from an injury or your nose that does not stop. ? Unusual colored urine (red or dark brown) or unusual colored stools (red or black). ? Unusual bruising for unknown reasons. ? A serious fall or if you hit your head (even if there is no bleeding).  Some medicines may interact with  Xarelto and might increase your risk of bleeding while on Xarelto. To help avoid this, consult your healthcare provider or pharmacist prior to using any new prescription or non-prescription medications, including herbals, vitamins, non-steroidal anti-inflammatory drugs (NSAIDs) and supplements.  This website has more information on Xarelto: [email or web address]-benson.[email or web address]   Atrial Fibrillation  Atrial fibrillation is a type of heartbeat that is irregular or fast. If you have this  condition, your heart beats without any order. This makes it hard for your heart to pump blood in a normal way. Atrial fibrillation may come and go, or it may become a long-lasting problem. If this condition is not treated, it can put you at higher risk for stroke, heart failure, and other heart problems. What are the causes? This condition may be caused by diseases that damage the heart. They include:  High blood pressure.  Heart failure.  Heart valve disease.  Heart surgery. Other causes include:  Diabetes.  Thyroid disease.  Being overweight.  Kidney disease. Sometimes the cause is not known. What increases the risk? You are more likely to develop this condition if:  You are older.  You smoke.  You exercise often and very hard.  You have a family history of this condition.  You are a man.  You use drugs.  You drink a lot of alcohol.  You have lung conditions, such as emphysema, pneumonia, or COPD.  You have sleep apnea. What are the signs or symptoms? Common symptoms of this condition include:  A feeling that your heart is beating very fast.  Chest pain or discomfort.  Feeling short of breath.  Suddenly feeling light-headed or weak.  Getting tired easily during activity.  Fainting.  Sweating. In some cases, there are no symptoms. How is this treated? Treatment for this condition depends on underlying conditions and how you feel when you have atrial fibrillation. They include:  Medicines to: ? Prevent blood clots. ? Treat heart rate or heart rhythm problems.  Using devices, such as a pacemaker, to correct heart rhythm problems.  Doing surgery to remove the part of the heart that sends bad signals.  Closing an area where clots can form in the heart (left atrial appendage). In some cases, your doctor will treat other underlying conditions. Follow these instructions at home: Medicines  Take over-the-counter and prescription medicines only as  told by your doctor.  Do not take any new medicines without first talking to your doctor.  If you are taking blood thinners: ? Talk with your doctor before you take any medicines that have aspirin or NSAIDs, such as ibuprofen, in them. ? Take your medicine exactly as told by your doctor. Take it at the same time each day. ? Avoid activities that could hurt or bruise you. Follow instructions about how to prevent falls. ? Wear a bracelet that says you are taking blood thinners. Or, carry a card that lists what medicines you take. Lifestyle      Do not use any products that have nicotine or tobacco in them. These include cigarettes, e-cigarettes, and chewing tobacco. If you need help quitting, ask your doctor.  Eat heart-healthy foods. Talk with your doctor about the right eating plan for you.  Exercise regularly as told by your doctor.  Do not drink alcohol.  Lose weight if you are overweight.  Do not use drugs, including cannabis. General instructions  If you have a condition that causes breathing to stop for a short period of time (apnea), treat it as told  by your doctor.  Keep a healthy weight. Do not use diet pills unless your doctor says they are safe for you. Diet pills may make heart problems worse.  Keep all follow-up visits as told by your doctor. This is important. Contact a doctor if:  You notice a change in the speed, rhythm, or strength of your heartbeat.  You are taking a blood-thinning medicine and you get more bruising.  You get tired more easily when you move or exercise.  You have a sudden change in weight. Get help right away if:   You have pain in your chest or your belly (abdomen).  You have trouble breathing.  You have side effects of blood thinners, such as blood in your vomit, poop (stool), or pee (urine), or bleeding that cannot stop.  You have any signs of a stroke. "BE FAST" is an easy way to remember the main warning signs: ? B - Balance.  Signs are dizziness, sudden trouble walking, or loss of balance. ? E - Eyes. Signs are trouble seeing or a change in how you see. ? F - Face. Signs are sudden weakness or loss of feeling in the face, or the face or eyelid drooping on one side. ? A - Arms. Signs are weakness or loss of feeling in an arm. This happens suddenly and usually on one side of the body. ? S - Speech. Signs are sudden trouble speaking, slurred speech, or trouble understanding what people say. ? T - Time. Time to call emergency services. Write down what time symptoms started.  You have other signs of a stroke, such as: ? A sudden, very bad headache with no known cause. ? Feeling like you may vomit (nausea). ? Vomiting. ? A seizure. These symptoms may be an emergency. Do not wait to see if the symptoms will go away. Get medical help right away. Call your local emergency services (911 in the [place] Do not drive yourself to the hospital. Summary  Atrial fibrillation is a type of heartbeat that is irregular or fast.  You are at higher risk of this condition if you smoke, are older, have diabetes, or are overweight.  Follow your doctor's instructions about medicines, diet, exercise, and follow-up visits.  Get help right away if you have signs or symptoms of a stroke.  Get help right away if you cannot catch your breath, or you have chest pain or discomfort. This information is not intended to replace advice given to you by your health care provider. Make sure you discuss any questions you have with your health care provider. Document Revised: 02/28/2019 Document Reviewed: 02/28/2019 Elsevier Patient Education  [place].   Gout  Gout is painful swelling of your joints. Gout is a type of arthritis. It is caused by having too much uric acid in your body. Uric acid is a chemical that is made when your body breaks down substances called purines. If your body has too much uric acid, sharp crystals can form and  build up in your joints. This causes pain and swelling. Gout attacks can happen quickly and be very painful (acute gout). Over time, the attacks can affect more joints and happen more often (chronic gout). What are the causes?  Too much uric acid in your blood. This can happen because: ? Your kidneys do not remove enough uric acid from your blood. ? Your body makes too much uric acid. ? You eat too many foods that are high in purines. These foods  include organ meats, some seafood, and beer.  Trauma or stress. What increases the risk?  Having a family history of gout.  Being male and middle-aged.  Being male and having gone through menopause.  Being very overweight (obese).  Drinking alcohol, especially beer.  Not having enough water in the body (being dehydrated).  Losing weight too quickly.  Having an organ transplant.  Having lead poisoning.  Taking certain medicines.  Having kidney disease.  Having a skin condition called psoriasis. What are the signs or symptoms? An attack of acute gout usually happens in just one joint. The most common place is the big toe. Attacks often start at night. Other joints that may be affected include joints of the feet, ankle, knee, fingers, wrist, or elbow. Symptoms of an attack may include:  Very bad pain.  Warmth.  Swelling.  Stiffness.  Shiny, red, or purple skin.  Tenderness. The affected joint may be very painful to touch.  Chills and fever. Chronic gout may cause symptoms more often. More joints may be involved. You may also have white or yellow lumps (tophi) on your hands or feet or in other areas near your joints. How is this treated?  Treatment for this condition has two phases: treating an acute attack and preventing future attacks.  Acute gout treatment may include: ? NSAIDs. ? Steroids. These are taken by mouth or injected into a joint. ? Colchicine. This medicine relieves pain and swelling. It can be given by  mouth or through an IV tube.  Preventive treatment may include: ? Taking small doses of NSAIDs or colchicine daily. ? Using a medicine that reduces uric acid levels in your blood. ? Making changes to your diet. You may need to see a food expert (dietitian) about what to eat and drink to prevent gout. Follow these instructions at home: During a gout attack   If told, put ice on the painful area: ? Put ice in a plastic bag. ? Place a towel between your skin and the bag. ? Leave the ice on for 20 minutes, 2-3 times a day.  Raise (elevate) the painful joint above the level of your heart as often as you can.  Rest the joint as much as possible. If the joint is in your leg, you may be given crutches.  Follow instructions from your doctor about what you cannot eat or drink. Avoiding future gout attacks  Eat a low-purine diet. Avoid foods and drinks such as: ? Liver. ? Kidney. ? Anchovies. ? Asparagus. ? Herring. ? Mushrooms. ? Mussels. ? Beer.  Stay at a healthy weight. If you want to lose weight, talk with your doctor. Do not lose weight too fast.  Start or continue an exercise plan as told by your doctor. Eating and drinking  Drink enough fluids to keep your pee (urine) pale yellow.  If you drink alcohol: ? Limit how much you use to:  0-1 drink a day for women.  0-2 drinks a day for men. ? Be aware of how much alcohol is in your drink. In the [place] one drink equals one 12 oz bottle of beer (355 mL), one 5 oz glass of wine (148 mL), or one 1 oz glass of hard liquor (44 mL). General instructions  Take over-the-counter and prescription medicines only as told by your doctor.  Do not drive or use heavy machinery while taking prescription pain medicine.  Return to your normal activities as told by your doctor. Ask your doctor what activities  are safe for you.  Keep all follow-up visits as told by your doctor. This is important. Contact a doctor if:  You have another  gout attack.  You still have symptoms of a gout attack after 10 days of treatment.  You have problems (side effects) because of your medicines.  You have chills or a fever.  You have burning pain when you pee (urinate).  You have pain in your lower back or belly. Get help right away if:  You have very bad pain.  Your pain cannot be controlled.  You cannot pee. Summary  Gout is painful swelling of the joints.  The most common site of pain is the big toe, but it can affect other joints.  Medicines and avoiding some foods can help to prevent and treat gout attacks. This information is not intended to replace advice given to you by your health care provider. Make sure you discuss any questions you have with your health care provider. Document Revised: 03/29/2018 Document Reviewed: 03/29/2018 Elsevier Patient Education  [place].

## 2020-03-02 NOTE — Discharge Summary (Addendum)
And then she will                                                                                                                                                                             [place]. TGG:[id number] DOB: 12/17/1940 DOA: 02/28/2020  PCP: Frank Gravel, MD  Admit date: 02/28/2020  Discharge date: 03/02/2020  Admitted From: Home  Disposition:  Home   Recommendations for Outpatient Follow-up:   Follow up with PCP in 1-2 weeks  PCP Please obtain BMP/CBC, 2 view CXR in 1week,  (see Discharge instructions)   PCP Please follow up on the following pending results:    Home Health: PT,RN   Equipment/Devices: walker  Consultations: None  Discharge Condition: Stable    CODE STATUS: Full    Diet Recommendation: Heart Healthy   Diet Order            Diet - low sodium heart healthy           Diet Heart Room service appropriate? Yes; Fluid consistency: Thin  Diet effective now                  Chief Complaint  Patient presents with  . Atrial Fibrillation     Brief history of present illness from the day of admission and additional interim summary      Frank Schmittis a 79 y.o.malewith medical history significant ofafib, noncompliant on Eliquis; HLD; HTN; stage 4 CKD; BPH: and carotid stenosis presenting with syncope, in rapid afib.His wife reports a problem with his left foot, ?gout. He couldn't walk so he got into the floor and wasn't able to get up, the ER he was diagnosed with left foot gout along with A. fib RVR and admit to the hospital.                                                                 Hospital Course   1.  Paroxysmal atrial fibrillation with RVR [place] vas 2 score of greater than 4.  Recent echo noted with preserved EF, stable TSH, he was initially on diltiazem drip which has been titrated off, Toprol dose has been increased, he also received 2 doses of IV digoxin, and excellent rate control, has been started on Xarelto for  anticoagulation, previously he has been prescribed Eliquis but stays he quit taking it due to constraints.  1 month supply of Xarelto will be provided through case management prior to discharge.  He is now symptom-free will be discharged home with close PCP and his primary cardiologist follow-up.  2.  Left foot gout with elevated uric acid levels.  Improved on colchicine and IV steroids, now almost completely pain-free and able to bear weight, will be discharged with home PT will give him a rolling walker in case he has pain and balance issues in the future since he is now on anticoagulation will not take any chances.  3.  Hypertension.    Toprol dose has been increased, have dropped lisinopril dose to accommodate.  4.  Mild wheezing.  Likely acute on chronic diastolic CHF-EF 43%.  Brought on by RVR.  Resolved after IV Lasix.  5.  Noted patient to have dyslipidemia, BPH, carotid artery disease, hearing loss.  Does not take medications or wear hearing aids per wife.  6.CKD 4 - baseline creat around 2, stable close to baseline.  PCP to monitor.    Discharge diagnosis     Principal Problem:   Atrial fibrillation with RVR (HCC) Active Problems:   Essential hypertension   CKD (chronic kidney disease) stage 4, GFR 15-29 ml/min (HCC)   HLD (hyperlipidemia)   Syncope   Left foot pain   Obesity (BMI 30.0-34.9)    Discharge instructions    Discharge Instructions    Diet - low sodium heart healthy   Complete by: As directed    Discharge instructions   Complete by: As directed    Follow with Primary MD Frank Gravel, MD in 7 days   Get CBC, CMP, 2 view Chest X ray -  checked next visit within 1 week by Primary MD    Activity: As tolerated with Full fall precautions use walker/cane & assistance as needed  Disposition Home    Diet: Heart Healthy    Special Instructions: If you have smoked or chewed Tobacco  in the last 2 yrs please stop smoking, stop any regular Alcohol  and or any  Recreational drug use.  On your next visit with your primary care physician please Get Medicines reviewed and adjusted.  Please request your Prim.MD to go over all Hospital Tests and Procedure/Radiological results at the follow up, please get all Hospital records sent to your Prim MD by signing hospital release before you go home.  If you experience worsening of your admission symptoms, develop shortness of breath, life threatening emergency, suicidal or homicidal thoughts you must seek medical attention immediately by calling 911 or calling your MD immediately  if symptoms less severe.  You Must read complete instructions/literature along with all the possible adverse reactions/side effects for all the Medicines you take and that have been prescribed to you. Take any new Medicines after you have completely understood and accpet all the possible adverse reactions/side effects.   Increase activity slowly   Complete by: As directed       Discharge Medications   Allergies as of 03/02/2020   No Known Allergies     Medication List    STOP taking these medications   aspirin 81 MG EC tablet     TAKE these medications   BC HEADACHE POWDER PO Take 1 packet by mouth daily as needed (For Headache). Takes as needed   colchicine 0.6 MG tablet Take 1 tablet (0.6 mg total) by mouth daily. Start taking on: March 03, 2020   hydrochlorothiazide 25 MG tablet Commonly known as: HYDRODIURIL Take 25 mg by mouth daily.   lisinopril 10 MG tablet Commonly known as: ZESTRIL Take 1  tablet (10 mg total) by mouth 2 (two) times daily. What changed:   medication strength  how much to take   metoprolol succinate 100 MG 24 hr tablet Commonly known as: Toprol XL Take 1 tablet (100 mg total) by mouth daily. What changed:   medication strength  how much to take   pantoprazole 40 MG tablet Commonly known as: PROTONIX Take 1 tablet (40 mg total) by mouth daily. Start taking on: March 03, 2020     rivaroxaban 20 MG Tabs tablet Commonly known as: XARELTO Take 1 tablet (20 mg total) by mouth daily with supper.   Tylenol 325 MG Caps Generic drug: Acetaminophen Take 325 mg by mouth daily as needed (For Pain).            Durable Medical Equipment  (From admission, onward)         Start     Ordered   03/02/20 0934  For home use only DME Walker rolling  Once       Comments: 5 wheel  Question Answer Comment  Walker: With Coleman Wheels   Patient needs a walker to treat with the following condition Weakness      03/02/20 0933   03/02/20 0928  For home use only DME oxygen  Once       Question Answer Comment  Length of Need 6 Months   Mode or (Route) Nasal cannula   Liters per Minute 2   Frequency Continuous (stationary and portable oxygen unit needed)   Oxygen conserving device Yes   Oxygen delivery system Gas      03/02/20 0927           Follow-up Information    Frank Gravel, MD. Schedule an appointment as soon as possible for a visit in 1 week(s).   Specialty: Internal Medicine Contact information: [place] [address] [phone]        Sueanne Margarita, MD. Schedule an appointment as soon as possible for a visit in 1 week(s).   Specialty: Cardiology Contact information: [address] [address] [phone]        [facility] Follow up.   Specialty: Home Health Services Why: Home therapist and nurse will call you to arrange visits starting Tuesday or Wednesday.  Contact information: [address] [place] [address] [phone]               Major procedures and Radiology Reports - PLEASE review detailed and final reports thoroughly  -        DG Chest Port 1 View  Result Date: 02/28/2020 CLINICAL DATA:  79 year old male with shortness of breath and atrial fibrillation. EXAM: PORTABLE CHEST 1 VIEW COMPARISON:  07/17/2015 chest radiograph. FINDINGS: UPPER limits  normal heart size again noted. Mild pulmonary vascular congestion is present. A large hiatal hernia is again identified. There is no evidence of focal airspace disease, pulmonary edema, suspicious pulmonary nodule/mass, pleural effusion, or pneumothorax. No acute bony abnormalities are identified. IMPRESSION: 1. Mild pulmonary vascular congestion. 2. Large hiatal hernia. Electronically Signed   By: Margarette Canada M.D.   On: 02/28/2020 13:33   DG Foot 2 Views Left  Result Date: 02/28/2020 CLINICAL DATA:  LEFT foot pain. No known injury. Initial encounter. EXAM: LEFT FOOT - 2 VIEW COMPARISON:  None. FINDINGS: No acute fracture, subluxation or dislocation identified. Mild degenerative changes in the midfoot and 1st MTP joint noted. Diffuse soft tissue  swelling is noted. No focal bony lesions are present. IMPRESSION: 1. Diffuse soft tissue swelling without acute bony abnormality. Electronically Signed   By: Margarette Canada M.D.   On: 02/28/2020 19:04   ECHOCARDIOGRAM COMPLETE  Result Date: 02/26/2020    ECHOCARDIOGRAM REPORT   Patient Name:   [name] Date of Exam: 02/26/2020 Medical Rec #:  [id number]          Height:       66.0 in Accession #:    [id number]         Weight:       215.0 lb Date of Birth:  [birth year]         BSA:          2.062 m Patient Age:    10 years           BP:           149/106 mmHg Patient Gender: M                  HR:           91 bpm. Exam Location:  [facility] Procedure: 2D Echo, Cardiac Doppler and Color Doppler Indications:    I48.0 Atrial Fibrillation  History:        Patient has prior history of Echocardiogram examinations, most                 recent 04/15/2015. Risk Factors:Hypertension and HLD, CKD.  Sonographer:    Marygrace Drought RCS Referring Phys: [place]  1. Left ventricular ejection fraction, by estimation, is 55 to 60%. The left ventricle has normal function. The left ventricle has no regional wall motion abnormalities. There is moderate  concentric left ventricular hypertrophy. Left ventricular diastolic parameters are indeterminate.  2. Right ventricular systolic function is normal. The right ventricular size is normal. There is moderately elevated pulmonary artery systolic pressure.  3. Left atrial size was severely dilated.  4. The mitral valve is normal in structure. Mild mitral valve regurgitation. No evidence of mitral stenosis.  5. The aortic valve is tricuspid. Aortic valve regurgitation is mild. No aortic stenosis is present.  6. The inferior vena cava is normal in size with greater than 50% respiratory variability, suggesting right atrial pressure of 3 mmHg. FINDINGS  Left Ventricle: Left ventricular ejection fraction, by estimation, is 55 to 60%. The left ventricle has normal function. The left ventricle has no regional wall motion abnormalities. The left ventricular internal cavity size was normal in size. There is  moderate concentric left ventricular hypertrophy. Left ventricular diastolic parameters are indeterminate. Indeterminate filling pressures. Right Ventricle: The right ventricular size is normal. No increase in right ventricular wall thickness. Right ventricular systolic function is normal. There is moderately elevated pulmonary artery systolic pressure. The tricuspid regurgitant velocity is 3.42 m/s, and with an assumed right atrial pressure of 3 mmHg, the estimated right ventricular systolic pressure is 46.5 mmHg. Left Atrium: Left atrial size was severely dilated. Right Atrium: Right atrial size was normal in size. Pericardium: There is no evidence of pericardial effusion. Mitral Valve: The mitral valve is normal in structure. Normal mobility of the mitral valve leaflets. Mild mitral valve regurgitation. No evidence of mitral valve stenosis. Tricuspid Valve: The tricuspid valve is normal in structure. Tricuspid valve regurgitation is trivial. No evidence of tricuspid stenosis. Aortic Valve: The aortic valve is tricuspid.  Aortic valve regurgitation is mild. Aortic regurgitation PHT measures 579 msec. No aortic  stenosis is present. Pulmonic Valve: The pulmonic valve was normal in structure. Pulmonic valve regurgitation is not visualized. No evidence of pulmonic stenosis. Aorta: The aortic root is normal in size and structure. Venous: The inferior vena cava is normal in size with greater than 50% respiratory variability, suggesting right atrial pressure of 3 mmHg. IAS/Shunts: No atrial level shunt detected by color flow Doppler.  LEFT VENTRICLE PLAX 2D LVIDd:         4.09 cm  Diastology LVIDs:         2.83 cm  LV e' lateral:   11.60 cm/s LV PW:         1.61 cm  LV E/e' lateral: 8.3 LV IVS:        1.39 cm  LV e' medial:    8.05 cm/s LVOT diam:     2.20 cm  LV E/e' medial:  11.9 LV SV:         66 LV SV Index:   32 LVOT Area:     3.80 cm  RIGHT VENTRICLE RV Basal diam:  3.73 cm RV S prime:     15.00 cm/s TAPSE (M-mode): 1.7 cm RVSP:           49.8 mmHg LEFT ATRIUM             Index       RIGHT ATRIUM           Index LA diam:        4.40 cm 2.13 cm/m  RA Pressure: 3.00 mmHg LA Vol (A2C):   71.3 ml 34.57 ml/m RA Area:     20.00 cm LA Vol (A4C):   84.5 ml 40.98 ml/m RA Volume:   58.90 ml  28.56 ml/m LA Biplane Vol: 81.5 ml 39.52 ml/m  AORTIC VALVE LVOT Vmax:   80.80 cm/s LVOT Vmean:  58.033 cm/s LVOT VTI:    0.173 m AI PHT:      579 msec  AORTA Ao Root diam: 3.50 cm MITRAL VALVE               TRICUSPID VALVE MV Area (PHT):             TR Peak grad:   46.8 mmHg MV Decel Time: 151 msec    TR Vmax:        342.00 cm/s MV E velocity: 96.10 cm/s  Estimated RAP:  3.00 mmHg                            RVSP:           49.8 mmHg                             SHUNTS                            Systemic VTI:  0.17 m                            Systemic Diam: 2.20 cm Skeet Latch MD Electronically signed by Skeet Latch MD Signature Date/Time: 02/26/2020/9:49:59 PM    Final    VAS US CAROTID  Result Date: 03/01/2020 Carotid Arterial Duplex  Study Indications:      Syncope and Rapid atrial fibrillation. Risk Factors:     Hypertension, hyperlipidemia. Other Factors:  Non compliant on Eliquis, CKD IV. Limitations       Today's exam was limited due to Breathing interference,                   arrythmia. Comparison Study: Prior study from 07/27/16 is available for comparison Performing Technologist: Sharion Dove RVS  Examination Guidelines: A complete evaluation includes B-mode imaging, spectral Doppler, color Doppler, and power Doppler as needed of all accessible portions of each vessel. Bilateral testing is considered an integral part of a complete examination. Limited examinations for reoccurring indications may be performed as noted.  Right Carotid Findings: +----------+--------+--------+--------+------------------+------------------+           PSV cm/sEDV cm/sStenosisPlaque DescriptionComments           +----------+--------+--------+--------+------------------+------------------+ CCA Prox  58      16                                intimal thickening +----------+--------+--------+--------+------------------+------------------+ CCA Distal65      21                                intimal thickening +----------+--------+--------+--------+------------------+------------------+ ICA Prox  66      28      1-39%   heterogenous      Shadowing          +----------+--------+--------+--------+------------------+------------------+ ICA Distal70      29                                                   +----------+--------+--------+--------+------------------+------------------+ ECA       133     23                                                   +----------+--------+--------+--------+------------------+------------------+ +----------+--------+-------+--------+-------------------+           PSV cm/sEDV cmsDescribeArm Pressure (mmHG) +----------+--------+-------+--------+-------------------+ CHENIDPOEU23                                          +----------+--------+-------+--------+-------------------+ +---------+--------+--+--------+--+ VertebralPSV cm/s70EDV cm/s24 +---------+--------+--+--------+--+  Left Carotid Findings: +----------+--------+--------+--------+------------------+------------------+           PSV cm/sEDV cm/sStenosisPlaque DescriptionComments           +----------+--------+--------+--------+------------------+------------------+ CCA Prox  55      15                                intimal thickening +----------+--------+--------+--------+------------------+------------------+ CCA Distal55      19                                intimal thickening +----------+--------+--------+--------+------------------+------------------+ ICA Prox  75      30      1-39%   heterogenous      Shadowing          +----------+--------+--------+--------+------------------+------------------+ ICA Distal68      30                                                   +----------+--------+--------+--------+------------------+------------------+  ECA       66      12                                                   +----------+--------+--------+--------+------------------+------------------+ +----------+--------+--------+--------+-------------------+           PSV cm/sEDV cm/sDescribeArm Pressure (mmHG) +----------+--------+--------+--------+-------------------+ Subclavian109                                         +----------+--------+--------+--------+-------------------+ +---------+--------+--+--------+--+ VertebralPSV cm/s45EDV cm/s17 +---------+--------+--+--------+--+   Summary: Right Carotid: Velocities in the right ICA are consistent with a 1-39% stenosis. Left Carotid: There is no evidence of stenosis in the left ICA. Vertebrals:  Bilateral vertebral arteries demonstrate antegrade flow. Subclavians: Normal flow hemodynamics were seen in bilateral subclavian               arteries. *See table(s) above for measurements and observations.  Electronically signed by Monica Martinez MD on 03/01/2020 at 12:47:33 PM.    Final     Micro Results     Recent Results (from the past 240 hour(s))  SARS Coronavirus 2 by RT PCR (hospital order, performed in [facility] hospital lab) Nasopharyngeal Nasopharyngeal Swab     Status: None   Collection Time: 02/28/20  8:29 PM   Specimen: Nasopharyngeal Swab  Result Value Ref Range Status   SARS Coronavirus 2 NEGATIVE NEGATIVE Final    Comment: (NOTE) SARS-CoV-2 target nucleic acids are NOT DETECTED.  The SARS-CoV-2 RNA is generally detectable in upper and lower respiratory specimens during the acute phase of infection. The lowest concentration of SARS-CoV-2 viral copies this assay can detect is 250 copies / mL. A negative result does not preclude SARS-CoV-2 infection and should not be used as the sole basis for treatment or other patient management decisions.  A negative result may occur with improper specimen collection / handling, submission of specimen other than nasopharyngeal swab, presence of viral mutation(s) within the areas targeted by this assay, and inadequate number of viral copies (<250 copies / mL). A negative result must be combined with clinical observations, patient history, and epidemiological information.  Fact Sheet for Patients:   StrictlyIdeas.no  Fact Sheet for Healthcare Providers: BankingDealers.co.za  This test is not yet approved or  cleared by the [place] FDA and has been authorized for detection and/or diagnosis of SARS-CoV-2 by FDA under an Emergency Use Authorization (EUA).  This EUA will remain in effect (meaning this test can be used) for the duration of the COVID-19 declaration under Section 564(b)(1) of the Act, 21 U.S.C. section 360bbb-3(b)(1), unless the authorization is terminated or revoked sooner.  Performed at [facility], [place] [address], [place], [place] [address]     Today   Subjective    Merrill Sinning today has no headache,no chest abdominal pain,no new weakness tingling or numbness, feels much better wants to go home today.    Objective   Blood pressure 145/90, pulse (!) 54, temperature 97.7 F (36.5 C), temperature source Oral, resp. rate 18, height 5\' 6"  (1.676 m), weight 92.2 kg, SpO2 99 %.   Intake/Output Summary (Last 24 hours) at 03/02/2020 1318 Last data filed at 03/01/2020 2114 Gross per 24 hour  Intake 3 ml  Output --  Net 3 ml    Exam  Awake Alert, No new F.N deficits, Normal affect Ingenio.AT,PERRAL Supple Neck,No JVD, No cervical lymphadenopathy appriciated.  Symmetrical Chest wall movement, Good air movement bilaterally, CTAB RRR,No Gallops,Rubs or new Murmurs, No Parasternal Heave +ve B.Sounds, Abd Soft, Non tender, No organomegaly appriciated, No rebound -guarding or rigidity. No Cyanosis, Clubbing or edema, No new Rash or bruise   Data Review   CBC w Diff:  Lab Results  Component Value Date   WBC 13.3 (H) 03/02/2020   HGB 11.9 (L) 03/02/2020   HCT 38.1 (L) 03/02/2020   PLT 265 03/02/2020   LYMPHOPCT 6 02/28/2020   MONOPCT 8 02/28/2020   EOSPCT 0 02/28/2020   BASOPCT 0 02/28/2020    CMP:  Lab Results  Component Value Date   NA 138 03/02/2020   NA 142 02/05/2020   K 4.2 03/02/2020   CL 101 03/02/2020   CO2 25 03/02/2020   BUN 47 (H) 03/02/2020   BUN 30 (H) 02/05/2020   CREATININE 1.51 (H) 03/02/2020   PROT 7.4 02/28/2020   ALBUMIN 4.2 02/28/2020   BILITOT 1.3 (H) 02/28/2020   ALKPHOS 53 02/28/2020   AST 21 02/28/2020   ALT 21 02/28/2020  .   Total Time in preparing paper work, data evaluation and todays exam - 24 minutes  Lala Lund M.D on 03/02/2020 at 1:18 PM  [facility]   Office  [phone]

## 2020-03-02 NOTE — Progress Notes (Signed)
Physical Therapy Treatment Patient Details Name: [name] MRN: [id number] DOB: 28-Feb-1941 Today's Date: 03/02/2020    History of Present Illness 79 y.o. male with medical history significant of afib, noncompliant on Eliquis; HLD; HTN; stage 4 CKD; BPH: and carotid stenosis presenting with syncope, in rapid afib RVR.    PT Comments    Patient seen for activity progression. Tolerated extended session well. Performed functional tasks in room with bathing/hygiene and pericare. After functional activity, patient tolerated increased ambulation with use of RW. Ambulated double distance compared to previous session. With extended ambulation, saturations to 87% however, pleth not uniform, patient conversational and no noted DOE or SOB. Had patient stop and saturations immediately improved above 90% on room air. Question accuracy of 87%. Patient with HR elevation to peak at 119 with activity but returned to baseline by end of session. At this time, patient reports feeling significantly better with mild discomfort in feet but no significant pain.  Anticipate patient will be safe for d/c home with family support. Current POC remains appropriate.  Follow Up Recommendations  Home health PT;Supervision/Assistance - 24 hour     Equipment Recommendations  None recommended by PT    Recommendations for Other Services       Precautions / Restrictions Precautions Precautions: Fall Precaution Comments: monitor O2 Restrictions Weight Bearing Restrictions: No RLE Weight Bearing: Weight bearing as tolerated LLE Weight Bearing: Weight bearing as tolerated    Mobility  Bed Mobility Overal bed mobility: Modified Independent Bed Mobility: Supine to Sit     Supine to sit: Supervision     General bed mobility comments: supervision for safety, cues and assist for line/lead awareness.  Transfers Overall transfer level: Needs assistance Equipment used: Rolling walker (2 wheeled) Transfers: Sit  to/from Stand Sit to Stand: Supervision         General transfer comment: Supervision to rise to upright, no physica assist required  Ambulation/Gait Ambulation/Gait assistance: Supervision Gait Distance (Feet): 170 Feet Assistive device: Rolling walker (2 wheeled) Gait Pattern/deviations: Step-through pattern;Decreased stride length;Antalgic Gait velocity: decreased  Gait velocity interpretation: <1.8 ft/sec, indicate of risk for recurrent falls General Gait Details: Noted tightness in bilateral LEs with ambulation, overall no DOE. Saturations dropped to 86 with poor pleth, stopped and patient immediately rebounded to 92%   Chief Strategy Officer    Modified Rankin (Stroke Patients Only)       Balance Overall balance assessment: Needs assistance Sitting-balance support: No upper extremity supported;Feet supported Sitting balance-Leahy Scale: Good     Standing balance support: No upper extremity supported Standing balance-Leahy Scale: Fair Standing balance comment: tolerated bathing/hygiene and pericare with close supervision                            Cognition Arousal/Alertness: Awake/alert Behavior During Therapy: WFL for tasks assessed/performed Overall Cognitive Status: Within Functional Limits for tasks assessed                                        Exercises      General Comments General comments (skin integrity, edema, etc.): SpO2 dropped to 86% with increased ambulation but noted poor pleth, upon stopping immediately rebounded to 92% without need for breathing changes. Patient did not appear SOB or DOE. HR did elevate to 119 with  activity.      Pertinent Vitals/Pain Pain Assessment: Faces Faces Pain Scale: Hurts a little bit Pain Location: sor and tightness in feet Pain Descriptors / Indicators: Tightness Pain Intervention(s): Monitored during session    Home Living                       Prior Function            PT Goals (current goals can now be found in the care plan section) Acute Rehab PT Goals Patient Stated Goal: To return home PT Goal Formulation: With patient/family Time For Goal Achievement: 03/15/20 Potential to Achieve Goals: Good Progress towards PT goals: Progressing toward goals    Frequency    Min 3X/week      PT Plan Current plan remains appropriate    Co-evaluation              AM-PAC PT "6 Clicks" Mobility   Outcome Measure  Help needed turning from your back to your side while in a flat bed without using bedrails?: None Help needed moving from lying on your back to sitting on the side of a flat bed without using bedrails?: None Help needed moving to and from a bed to a chair (including a wheelchair)?: A Little Help needed standing up from a chair using your arms (e.g., wheelchair or bedside chair)?: A Little Help needed to walk in hospital room?: A Little Help needed climbing 3-5 steps with a railing? : A Lot 6 Click Score: 19    End of Session Equipment Utilized During Treatment: Gait belt;Oxygen Activity Tolerance: Patient tolerated treatment well Patient left: Other (comment) (Commode with Nursing and family in room.) Nurse Communication: Mobility status PT Visit Diagnosis: Other abnormalities of gait and mobility (R26.89);Unsteadiness on feet (R26.81);Muscle weakness (generalized) (M62.81);Difficulty in walking, not elsewhere classified (R26.2);Pain Pain - Right/Left: Right Pain - part of body:  (BIL feet)     Time: [id number] PT Time Calculation (min) (ACUTE ONLY): 26 min  Charges:  $Gait Training: 8-22 mins $Therapeutic Activity: 8-22 mins                     Alben Deeds, PT DPT  Board Certified Neurologic Specialist [place] Pager [phone] Office [phone]    Duncan Dull 03/02/2020, 9:21 AM

## 2020-03-03 ENCOUNTER — Telehealth: Payer: Self-pay | Admitting: *Deleted

## 2020-03-03 NOTE — Telephone Encounter (Signed)
Staff message sent to [name] ok to schedule HST. Patient has [place] and does not require a PA.

## 2020-03-03 NOTE — Progress Notes (Signed)
Cardiology Office Note    Date:  03/04/2020   ID:  [name], DOB 1940-12-05, MRN [id number]  PCP:  [name], MD  Cardiologist: [name], MD EPS: None  Chief Complaint  Patient presents with  . Hospitalization Follow-up    History of Present Illness:  Frank W Dorse Locy. is a 79 y.o. male with history of PAF, obesity, hypertension, carotid disease, CKD.  Patient was seen by Dr. [name] 02/05/2020 and started on Eliquis and Toprol.  Patient did not start the Eliquis because he wants to take 2 BC powders daily and extra strength goodies daily.  Seen in the A. fib clinic 02/26/2020 and refused anticoagulation despite his CHA2DS2-VASc equals 3 and they will not be able to get him back in normal sinus rhythm without it.  2D echo 02/26/2020 showed normal LVEF 55 to 60% with moderate LVH moderately elevated pulmonary artery systolic pressure, severely dilated left atrium and mild aortic regurgitation.  Dr. [name] ordered VQ scan PFTs with DLCO and home sleep study for etiologies of pulmonary hypertension.  None of these studies have been done yet.  Patient was discharged from hospital 03/02/2020 with atrial fib with RVR on Toprol dose was increased.  He also received 2 doses of body B digoxin and IV diltiazem.  He was started on Xarelto for anticoagulation.  Lisinopril decreased because of blood pressure.  Given dose of IV Lasix.  Was a question of syncope as EMS report he passed out multiple times trying to get up and was having significant foot pain from gout but patient adamantly denies that he passed out. Patient was sent home on O2 but not using it and doesn't think he needs it. No palpitations and breathing ok.  Past Medical History:  Diagnosis Date  . Benign localized hyperplasia of prostate with urinary obstruction 07/21/2015  . CAP (community acquired pneumonia) 04/14/2015  . Carotid stenosis    left  . CKD (chronic kidney disease) stage 4, GFR 15-29 ml/min (HCC) 04/14/2015  .  Complication of anesthesia   . Elevated troponin 04/14/2015  . Essential hypertension 04/14/2015  . Gram-negative bacteremia 04/15/2015  . Headache    migraines  . Hearing loss   . Hematuria 04/19/2015  . HLD (hyperlipidemia)   . Normocytic hypochromic anemia 04/14/2015  . PAF (paroxysmal atrial fibrillation) ([place]) 04/14/2015  . PONV (postoperative nausea and vomiting)   . Sepsis secondary to UTI ([place]) 04/23/2015  . Transfusion history    last 04-24-15  . Urinary retention     Past Surgical History:  Procedure Laterality Date  . ESOPHAGOGASTRODUODENOSCOPY N/A 04/23/2015   Procedure: ESOPHAGOGASTRODUODENOSCOPY (EGD);  Surgeon: Carol Ada, MD;  Location: [facility] ENDOSCOPY;  Service: Endoscopy;  Laterality: N/A;  . shots behind eye     right  . TONSILLECTOMY    . TRANSURETHRAL RESECTION OF PROSTATE N/A 07/21/2015   Procedure: TRANSURETHRAL RESECTION OF THE PROSTATE (TURP) WITH CHIPS;  Surgeon: Carolan Clines, MD;  Location: [facility] ORS;  Service: Urology;  Laterality: N/A;  . TRANSURETHRAL RESECTION OF PROSTATE N/A 11/07/2015   Procedure: TRANSURETHRAL RESECTION OF THE PROSTATE (TURP);  Surgeon: Carolan Clines, MD;  Location: [facility] ORS;  Service: Urology;  Laterality: N/A;  . VASECTOMY      Current Medications: Current Meds  Medication Sig  . Acetaminophen (TYLENOL) 325 MG CAPS Take 325 mg by mouth daily as needed (For Pain).   . Aspirin-Salicylamide-Caffeine (BC HEADACHE POWDER PO) Take 1 packet by mouth daily as needed (For Headache). Takes as  needed   . colchicine 0.6 MG tablet Take 1 tablet (0.6 mg total) by mouth daily.  . hydrochlorothiazide (HYDRODIURIL) 25 MG tablet Take 25 mg by mouth daily.  Marland Kitchen lisinopril (ZESTRIL) 10 MG tablet Take 1 tablet (10 mg total) by mouth 2 (two) times daily.  . metoprolol succinate (TOPROL XL) 100 MG 24 hr tablet Take 1 tablet (100 mg total) by mouth daily.  . pantoprazole (PROTONIX) 40 MG tablet Take 1 tablet (40 mg total) by mouth daily.  . rivaroxaban  (XARELTO) 20 MG TABS tablet Take 1 tablet (20 mg total) by mouth daily with supper.     Allergies:   Patient has no known allergies.   Social History   Socioeconomic History  . Marital status: Married    Spouse name: Not on file  . Number of children: Not on file  . Years of education: Not on file  . Highest education level: Not on file  Occupational History  . Occupation: retired  Tobacco Use  . Smoking status: Never Smoker  . Smokeless tobacco: Never Used  Substance and Sexual Activity  . Alcohol use: No  . Drug use: No  . Sexual activity: Not on file  Other Topics Concern  . Not on file  Social History Narrative  . Not on file   Social Determinants of Health   Financial Resource Strain:   . Difficulty of Paying Living Expenses:   Food Insecurity:   . Worried About Charity fundraiser in the Last Year:   . Arboriculturist in the Last Year:   Transportation Needs:   . Film/video editor (Medical):   Marland Kitchen Lack of Transportation (Non-Medical):   Physical Activity:   . Days of Exercise per Week:   . Minutes of Exercise per Session:   Stress:   . Feeling of Stress :   Social Connections:   . Frequency of Communication with Friends and Family:   . Frequency of Social Gatherings with Friends and Family:   . Attends Religious Services:   . Active Member of Clubs or Organizations:   . Attends Archivist Meetings:   Marland Kitchen Marital Status:      Family History:  The patient's   family history includes Breast cancer in his mother.   ROS:   Please see the history of present illness.    ROS All other systems reviewed and are negative.   PHYSICAL EXAM:   VS:  BP 117/82   Pulse 83   Ht 5\' 6"  (1.676 m)   Wt 206 lb (93.4 kg)   SpO2 100%   BMI 33.25 kg/m   Physical Exam  GEN: Obese, in no acute distress  Neck: no JVD, carotid bruits, or masses Cardiac:irreg no murmurs, rubs, or gallops  Respiratory:  clear to auscultation bilaterally, normal work of  breathing GI: soft, nontender, nondistended, + BS Ext: trace of edema and brawny changes Good distal pulses bilaterally Neuro:  Alert and Oriented x 3 Psych: euthymic mood, full affect  Wt Readings from Last 3 Encounters:  03/04/20 206 lb (93.4 kg)  03/02/20 203 lb 4.8 oz (92.2 kg)  02/26/20 215 lb (97.5 kg)      Studies/Labs Reviewed:   EKG:  EKG is not ordered today.    Recent Labs: 02/28/2020: ALT 21; TSH 1.211 03/02/2020: B Natriuretic Peptide 396.1; BUN 47; Creatinine, Ser 1.51; Hemoglobin 11.9; Magnesium 2.2; Platelets 265; Potassium 4.2; Sodium 138   Lipid Panel    Component Value  Date/Time   CHOL 136 04/15/2015 0355   TRIG 134 04/15/2015 0355   HDL 23 (L) 04/15/2015 0355   CHOLHDL 5.9 04/15/2015 0355   VLDL 27 04/15/2015 0355   LDLCALC 86 04/15/2015 0355    Additional studies/ records that were reviewed today include:  Carotid Dopplers 03/01/2020  Summary:  Right Carotid: Velocities in the right ICA are consistent with a 1-39%  stenosis.   Left Carotid: There is no evidence of stenosis in the left ICA.   Vertebrals:  Bilateral vertebral arteries demonstrate antegrade flow.  Subclavians: Normal flow hemodynamics were seen in bilateral subclavian               arteries.   *See table(s) above for measurements and observations.    2D echo 02/26/2020 IMPRESSIONS     1. Left ventricular ejection fraction, by estimation, is 55 to 60%. The  left ventricle has normal function. The left ventricle has no regional  wall motion abnormalities. There is moderate concentric left ventricular  hypertrophy. Left ventricular  diastolic parameters are indeterminate.   2. Right ventricular systolic function is normal. The right ventricular  size is normal. There is moderately elevated pulmonary artery systolic  pressure.   3. Left atrial size was severely dilated.   4. The mitral valve is normal in structure. Mild mitral valve  regurgitation. No evidence of mitral stenosis.    5. The aortic valve is tricuspid. Aortic valve regurgitation is mild. No  aortic stenosis is present.   6. The inferior vena cava is normal in size with greater than 50%  respiratory variability, suggesting right atrial pressure of 3 mmHg.   FINDINGS   Left Ventricle: Left ventricular ejection fraction, by estimation, is 55  to 60%. The left ventricle has normal function. The left ventricle has no  regional wall motion abnormalities. The left ventricular internal cavity  size was normal in size. There is   moderate concentric left ventricular hypertrophy. Left ventricular  diastolic parameters are indeterminate. Indeterminate filling pressures.   Right Ventricle: The right ventricular size is normal. No increase in  right ventricular wall thickness. Right ventricular systolic function is  normal. There is moderately elevated pulmonary artery systolic pressure.  The tricuspid regurgitant velocity is  3.42 m/s, and with an assumed right atrial pressure of 3 mmHg, the  estimated right ventricular systolic pressure is 57.8 mmHg.   Left Atrium: Left atrial size was severely dilated.   Right Atrium: Right atrial size was normal in size.   Pericardium: There is no evidence of pericardial effusion.   Mitral Valve: The mitral valve is normal in structure. Normal mobility of  the mitral valve leaflets. Mild mitral valve regurgitation. No evidence of  mitral valve stenosis.   Tricuspid Valve: The tricuspid valve is normal in structure. Tricuspid  valve regurgitation is trivial. No evidence of tricuspid stenosis.   Aortic Valve: The aortic valve is tricuspid. Aortic valve regurgitation is  mild. Aortic regurgitation PHT measures 579 msec. No aortic stenosis is  present.   Pulmonic Valve: The pulmonic valve was normal in structure. Pulmonic valve  regurgitation is not visualized. No evidence of pulmonic stenosis.   Aorta: The aortic root is normal in size and structure.   Venous: The  inferior vena cava is normal in size with greater than 50%  respiratory variability, suggesting right atrial pressure of 3 mmHg.   IAS/Shunts: No atrial level shunt detected by color flow Doppler.         ASSESSMENT:  1. Paroxysmal atrial fibrillation ([place])   2. Pulmonary hypertension, unspecified ([place])   3. Essential hypertension   4. Obesity (BMI 30.0-34.9)   5. Stenosis of left carotid artery      PLAN:  In order of problems listed above:  Paroxysmal atrial fibrillation CHA2DS2-VASc equals 3-4, patient initiallyrefused anticoagulation 2D echo normal LVEF 55 to 60% with moderate LVH.  Discharged from the hospital after being admitted with question of syncope and rapid atrial fibrillation controlled with increased Toprol.  Now on Xarelto. Rate controlled today. No changes  Moderate pulmonary hypertension on echo.  Dr. [name] ordered VQ scan, PFTs and home sleep study -all to be scheduled.  Hypertension lisinopril decreased because toprol increased  Obesity lost 10 lbs in the hospital but weight loss would be beneficial to overall health.  Carotid disease Doppler 02/2020 stable see above    Medication Adjustments/Labs and Tests Ordered: Current medicines are reviewed at length with the patient today.  Concerns regarding medicines are outlined above.  Medication changes, Labs and Tests ordered today are listed in the Patient Instructions below. Patient Instructions   Medication Instructions:  Your physician recommends that you continue on your current medications as directed. Please refer to the Current Medication list given to you today.  *If you need a refill on your cardiac medications before your next appointment, please call your pharmacy*   Lab Work: NONE If you have labs (blood work) drawn today and your tests are completely normal, you will receive your results only by: Marland Kitchen MyChart Message (if you have MyChart) OR . A paper copy in the mail If you have any lab  test that is abnormal or we need to change your treatment, we will call you to review the results.   Testing/Procedures: VQ SCAN AND PULMONARY FUNCTION TEST NEED TO BE SCHEDULED    Follow-Up: At [facility], you and your health needs are our priority.  As part of our continuing mission to provide you with exceptional heart care, we have created designated Provider Care Teams.  These Care Teams include your primary Cardiologist (physician) and Advanced Practice Providers (APPs -  Physician Assistants and Nurse Practitioners) who all work together to provide you with the care you need, when you need it.  We recommend signing up for the patient portal called "MyChart".  Sign up information is provided on this After Visit Summary.  MyChart is used to connect with patients for Virtual Visits (Telemedicine).  Patients are able to view lab/test results, encounter notes, upcoming appointments, etc.  Non-urgent messages can be sent to your provider as well.   To learn more about what you can do with MyChart, go to [email or web address].    Your next appointment:   2 month(s)  AFTER TESTING COMPLETE  The format for your next appointment:   In Person  Provider:   [name], MD   Other Instructions Two Gram Sodium Diet 2000 mg  What is Sodium? Sodium is a mineral found naturally in many foods. The most significant source of sodium in the diet is table salt, which is about 40% sodium.  Processed, convenience, and preserved foods also contain a large amount of sodium.  The body needs only 500 mg of sodium daily to function,  A normal diet provides more than enough sodium even if you do not use salt.  Why Limit Sodium? A build up of sodium in the body can cause thirst, increased blood pressure, shortness of breath, and water retention.  Decreasing sodium in  the diet can reduce edema and risk of heart attack or stroke associated with high blood pressure.  Keep in mind that there are many other  factors involved in these health problems.  Heredity, obesity, lack of exercise, cigarette smoking, stress and what you eat all play a role.  General Guidelines:  Do not add salt at the table or in cooking.  One teaspoon of salt contains over 2 grams of sodium.  Read food labels  Avoid processed and convenience foods  Ask your dietitian before eating any foods not dicussed in the menu planning guidelines  Consult your physician if you wish to use a salt substitute or a sodium containing medication such as antacids.  Limit milk and milk products to 16 oz (2 cups) per day.  Shopping Hints:  READ LABELS!! "Dietetic" does not necessarily mean low sodium.  Salt and other sodium ingredients are often added to foods during processing.   Menu Planning Guidelines Food Group Choose More Often Avoid  Beverages (see also the milk group All fruit juices, low-sodium, salt-free vegetables juices, low-sodium carbonated beverages Regular vegetable or tomato juices, commercially softened water used for drinking or cooking  Breads and Cereals Enriched white, wheat, rye and pumpernickel bread, hard rolls and dinner rolls; muffins, cornbread and waffles; most dry cereals, cooked cereal without added salt; unsalted crackers and breadsticks; low sodium or homemade bread crumbs Bread, rolls and crackers with salted tops; quick breads; instant hot cereals; pancakes; commercial bread stuffing; self-rising flower and biscuit mixes; regular bread crumbs or cracker crumbs  Desserts and Sweets Desserts and sweets mad with mild should be within allowance Instant pudding mixes and cake mixes  Fats Butter or margarine; vegetable oils; unsalted salad dressings, regular salad dressings limited to 1 Tbs; light, sour and heavy cream Regular salad dressings containing bacon fat, bacon bits, and salt pork; snack dips made with instant soup mixes or processed cheese; salted nuts  Fruits Most fresh, frozen and canned fruits  Fruits processed with salt or sodium-containing ingredient (some dried fruits are processed with sodium sulfites        Vegetables Fresh, frozen vegetables and low- sodium canned vegetables Regular canned vegetables, sauerkraut, pickled vegetables, and others prepared in brine; frozen vegetables in sauces; vegetables seasoned with ham, bacon or salt pork  Condiments, Sauces, Miscellaneous  Salt substitute with physician's approval; pepper, herbs, spices; vinegar, lemon or lime juice; hot pepper sauce; garlic powder, onion powder, low sodium soy sauce (1 Tbs.); low sodium condiments (ketchup, chili sauce, mustard) in limited amounts (1 tsp.) fresh ground horseradish; unsalted tortilla chips, pretzels, potato chips, popcorn, salsa (1/4 cup) Any seasoning made with salt including garlic salt, celery salt, onion salt, and seasoned salt; sea salt, rock salt, kosher salt; meat tenderizers; monosodium glutamate; mustard, regular soy sauce, barbecue, sauce, chili sauce, teriyaki sauce, steak sauce, Worcestershire sauce, and most flavored vinegars; canned gravy and mixes; regular condiments; salted snack foods, olives, picles, relish, horseradish sauce, catsup   Food preparation: Try these seasonings Meats:    Pork Sage, onion Serve with applesauce  Chicken Poultry seasoning, thyme, parsley Serve with cranberry sauce  Lamb Curry powder, rosemary, garlic, thyme Serve with mint sauce or jelly  Veal Marjoram, basil Serve with current jelly, cranberry sauce  Beef Pepper, bay leaf Serve with dry mustard, unsalted chive butter  Fish [place] leaf, dill Serve with unsalted lemon butter, unsalted parsley butter  Vegetables:    Asparagus Lemon juice   Broccoli Lemon juice   Carrots Mustard dressing  parsley, mint, nutmeg, glazed with unsalted butter and sugar   Green beans Marjoram, lemon juice, nutmeg,dill seed   Tomatoes Basil, marjoram, onion   Spice /blend for [employer]" 4 tsp ground thyme 1 tsp ground sage  3 tsp ground rosemary 4 tsp ground marjoram   Test your knowledge 1. A product that says "Salt Free" may still contain sodium. True or False 2. Garlic Powder and Hot Pepper Sauce an be used as alternative seasonings.True or False 3. Processed foods have more sodium than fresh foods.  True or False 4. Canned Vegetables have less sodium than froze True or False  WAYS TO DECREASE YOUR SODIUM INTAKE 1. Avoid the use of added salt in cooking and at the table.  Table salt (and other prepared seasonings which contain salt) is probably one of the greatest sources of sodium in the diet.  Unsalted foods can gain flavor from the sweet, sour, and butter taste sensations of herbs and spices.  Instead of using salt for seasoning, try the following seasonings with the foods listed.  Remember: how you use them to enhance natural food flavors is limited only by your creativity... Allspice-Meat, fish, eggs, fruit, peas, red and yellow vegetables Almond Extract-Fruit baked goods Anise Seed-Sweet breads, fruit, carrots, beets, cottage cheese, cookies (tastes like licorice) Basil-Meat, fish, eggs, vegetables, rice, vegetables salads, soups, sauces Bay Leaf-Meat, fish, stews, poultry Burnet-Salad, vegetables (cucumber-like flavor) Caraway Seed-Bread, cookies, cottage cheese, meat, vegetables, cheese, rice Cardamon-Baked goods, fruit, soups Celery Powder or seed-Salads, salad dressings, sauces, meatloaf, soup, bread.Do not use  celery salt Chervil-Meats, salads, fish, eggs, vegetables, cottage cheese (parsley-like flavor) Chili Power-Meatloaf, chicken cheese, corn, eggplant, egg dishes Chives-Salads cottage cheese, egg dishes, soups, vegetables, sauces Cilantro-Salsa, casseroles Cinnamon-Baked goods, fruit, pork, lamb, chicken, carrots Cloves-Fruit, baked goods, fish, pot roast, green beans, beets, carrots Coriander-Pastry, cookies, meat, salads, cheese (lemon-orange flavor) Cumin-Meatloaf, fish,cheese, eggs,  cabbage,fruit pie (caraway flavor) [employer], fruit, eggs, fish, poultry, cottage cheese, vegetables Dill Seed-Meat, cottage cheese, poultry, vegetables, fish, salads, bread Fennel Seed-Bread, cookies, apples, pork, eggs, fish, beets, cabbage, cheese, Licorice-like flavor Garlic-(buds or powder) Salads, meat, poultry, fish, bread, butter, vegetables, potatoes.Do not  use garlic salt Ginger-Fruit, vegetables, baked goods, meat, fish, poultry Horseradish Root-Meet, vegetables, butter Lemon Juice or Extract-Vegetables, fruit, tea, baked goods, fish salads Mace-Baked goods fruit, vegetables, fish, poultry (taste like nutmeg) Maple Extract-Syrups Marjoram-Meat, chicken, fish, vegetables, breads, green salads (taste like Sage) Mint-Tea, lamb, sherbet, vegetables, desserts, carrots, cabbage Mustard, Dry or Seed-Cheese, eggs, meats, vegetables, poultry Nutmeg-Baked goods, fruit, chicken, eggs, vegetables, desserts Onion Powder-Meat, fish, poultry, vegetables, cheese, eggs, bread, rice salads (Do not use   Onion salt) Orange Extract-Desserts, baked goods Oregano-Pasta, eggs, cheese, onions, pork, lamb, fish, chicken, vegetables, green salads Paprika-Meat, fish, poultry, eggs, cheese, vegetables Parsley Flakes-Butter, vegetables, meat fish, poultry, eggs, bread, salads (certain forms may   Contain sodium Pepper-Meat fish, poultry, vegetables, eggs Peppermint Extract-Desserts, baked goods Poppy Seed-Eggs, bread, cheese, fruit dressings, baked goods, noodles, vegetables, cottage  [employer], poultry, meat, fish, cauliflower, turnips,eggs bread Saffron-Rice, bread, veal, chicken, fish, eggs Sage-Meat, fish, poultry, onions, eggplant, tomateos, pork, stews Savory-Eggs, salads, poultry, meat, rice, vegetables, soups, pork Tarragon-Meat, poultry, fish, eggs, butter, vegetables (licorice-like flavor)  Thyme-Meat, poultry, fish, eggs, vegetables,  (clover-like flavor), sauces, soups Tumeric-Salads, butter, eggs, fish, rice, vegetables (saffron-like flavor) Vanilla Extract-Baked goods, candy Vinegar-Salads, vegetables, meat marinades Walnut Extract-baked goods, candy  2. Choose your Foods Wisely   The following is a list of foods to  avoid which are high in sodium:  Meats-Avoid all smoked, canned, salt cured, dried and kosher meat and fish as well as Anchovies   [employer] meats:Bologna, Liverwurst, Pastrami Canned meat or fish  Marinated herring Caviar    Pepperoni Corned Beef   Pizza Dried chipped beef  Salami Frozen breaded fish or meat Salt pork Frankfurters or hot dogs  Sardines Gefilte fish   Sausage Ham (boiled ham, Proscuitto Smoked butt    spiced ham)   Spam      TV Dinners Vegetables Canned vegetables (Regular) Relish Canned mushrooms  Sauerkraut Olives    Tomato juice Pickles  Bakery and Dessert Products Canned puddings  Cream pies Cheesecake   Decorated cakes Cookies  Beverages/Juices Tomato juice, regular  Gatorade   V-8 vegetable juice, regular  Breads and Cereals Biscuit mixes   Salted potato chips, corn chips, pretzels Bread stuffing mixes  Salted crackers and rolls Pancake and waffle mixes Self-rising flour  Seasonings Accent    Meat sauces Barbecue sauce  Meat tenderizer Catsup    Monosodium glutamate (MSG) Celery salt   Onion salt Chili sauce   Prepared mustard Garlic salt   Salt, seasoned salt, sea salt Gravy mixes   Soy sauce Horseradish   Steak sauce Ketchup   Tartar sauce Lite salt    Teriyaki sauce Marinade mixes   Worcestershire sauce  Others Baking powder   Cocoa and cocoa mixes Baking soda   Commercial casserole mixes Candy-caramels, chocolate  Dehydrated soups    Bars, fudge,nougats  Instant rice and pasta mixes Canned broth or soup  Maraschino cherries Cheese, aged and processed cheese and cheese spreads  Learning Assessment Quiz  Indicated T (for True) or F  (for False) for each of the following statements:  1. _____ Fresh fruits and vegetables and unprocessed grains are generally low in sodium 2. _____ Water may contain a considerable amount of sodium, depending on the source 3. _____ You can always tell if a food is high in sodium by tasting it 4. _____ Certain laxatives my be high in sodium and should be avoided unless prescribed   by a physician or pharmacist 5. _____ Salt substitutes may be used freely by anyone on a sodium restricted diet 6. _____ Sodium is present in table salt, food additives and as a natural component of   most foods 7. _____ Table salt is approximately 90% sodium 8. _____ Limiting sodium intake may help prevent excess fluid accumulation in the body 9. _____ On a sodium-restricted diet, seasonings such as bouillon soy sauce, and    cooking wine should be used in place of table salt 10. _____ On an ingredient list, a product which lists monosodium glutamate as the first   ingredient is an appropriate food to include on a low sodium diet  Circle the best answer(s) to the following statements (Hint: there may be more than one correct answer)  11. On a low-sodium diet, some acceptable snack items are:    A. Olives  F. Bean dip   K. Grapefruit juice    B. Salted Pretzels G. Commercial Popcorn   L. Canned peaches    C. Carrot Sticks  H. Bouillon   M. Unsalted nuts   D. [place] fries  I. Peanut butter crackers N. Salami   E. Sweet pickles J. Tomato Juice   O. Pizza  12.  Seasonings that may be used freely on a reduced - sodium diet include   A. [place]n [place]  wedges F.Monosodium glutamate K. Celery seed    B.Soysauce   G. Pepper   L. Mustard powder   C. Sea salt  H. Cooking wine  M. Onion flakes   D. Vinegar  E. Prepared horseradish N. Salsa   E. Sage   J. Worcestershire sauce  O. [address]     Sumner Boast, PA-C  03/04/2020 10:11 AM    [facility] [place], [place], [place]   [address] Phone: [phone]; Fax: [phone]

## 2020-03-04 ENCOUNTER — Other Ambulatory Visit: Payer: Self-pay

## 2020-03-04 ENCOUNTER — Encounter: Payer: Self-pay | Admitting: Physician Assistant

## 2020-03-04 ENCOUNTER — Ambulatory Visit: Payer: Medicare Other | Admitting: Physician Assistant

## 2020-03-04 VITALS — BP 117/82 | HR 83 | Ht 66.0 in | Wt 206.0 lb

## 2020-03-04 DIAGNOSIS — I6522 Occlusion and stenosis of left carotid artery: Secondary | ICD-10-CM

## 2020-03-04 DIAGNOSIS — E669 Obesity, unspecified: Secondary | ICD-10-CM

## 2020-03-04 DIAGNOSIS — I272 Pulmonary hypertension, unspecified: Secondary | ICD-10-CM | POA: Diagnosis not present

## 2020-03-04 DIAGNOSIS — I712 Thoracic aortic aneurysm, without rupture, unspecified: Secondary | ICD-10-CM

## 2020-03-04 DIAGNOSIS — I480 Paroxysmal atrial fibrillation: Secondary | ICD-10-CM | POA: Diagnosis not present

## 2020-03-04 DIAGNOSIS — I10 Essential (primary) hypertension: Secondary | ICD-10-CM | POA: Diagnosis not present

## 2020-03-04 NOTE — Addendum Note (Signed)
Addended by: Jacinta Shoe on: 03/04/2020 10:27 AM   Modules accepted: Orders

## 2020-03-04 NOTE — Patient Instructions (Signed)
Medication Instructions:  Your physician recommends that you continue on your current medications as directed. Please refer to the Current Medication list given to you today.  *If you need a refill on your cardiac medications before your next appointment, please call your pharmacy*   Lab Work: NONE If you have labs (blood work) drawn today and your tests are completely normal, you will receive your results only by:  [place] (if you have MyChart) OR  A paper copy in the mail If you have any lab test that is abnormal or we need to change your treatment, we will call you to review the results.   Testing/Procedures: VQ SCAN AND PULMONARY FUNCTION TEST NEED TO BE SCHEDULED    Follow-Up: At [facility], you and your health needs are our priority.  As part of our continuing mission to provide you with exceptional heart care, we have created designated Provider Care Teams.  These Care Teams include your primary Cardiologist (physician) and Advanced Practice Providers (APPs -  Physician Assistants and Nurse Practitioners) who all work together to provide you with the care you need, when you need it.  We recommend signing up for the patient portal called "MyChart".  Sign up information is provided on this After Visit Summary.  MyChart is used to connect with patients for Virtual Visits (Telemedicine).  Patients are able to view lab/test results, encounter notes, upcoming appointments, etc.  Non-urgent messages can be sent to your provider as well.   To learn more about what you can do with MyChart, go to [email or web address].    Your next appointment:   2 month(s)  AFTER TESTING COMPLETE  The format for your next appointment:   In Person  Provider:   [name], MD   Other Instructions Two Gram Sodium Diet 2000 mg  What is Sodium? Sodium is a mineral found naturally in many foods. The most significant source of sodium in the diet is table salt, which is about 40% sodium.   Processed, convenience, and preserved foods also contain a large amount of sodium.  The body needs only 500 mg of sodium daily to function,  A normal diet provides more than enough sodium even if you do not use salt.  Why Limit Sodium? A build up of sodium in the body can cause thirst, increased blood pressure, shortness of breath, and water retention.  Decreasing sodium in the diet can reduce edema and risk of heart attack or stroke associated with high blood pressure.  Keep in mind that there are many other factors involved in these health problems.  Heredity, obesity, lack of exercise, cigarette smoking, stress and what you eat all play a role.  General Guidelines:  Do not add salt at the table or in cooking.  One teaspoon of salt contains over 2 grams of sodium.  Read food labels  Avoid processed and convenience foods  Ask your dietitian before eating any foods not dicussed in the menu planning guidelines  Consult your physician if you wish to use a salt substitute or a sodium containing medication such as antacids.  Limit milk and milk products to 16 oz (2 cups) per day.  Shopping Hints:  READ LABELS!! "Dietetic" does not necessarily mean low sodium.  Salt and other sodium ingredients are often added to foods during processing.   Menu Planning Guidelines Food Group Choose More Often Avoid  Beverages (see also the milk group All fruit juices, low-sodium, salt-free vegetables juices, low-sodium carbonated beverages Regular vegetable or tomato  juices, commercially softened water used for drinking or cooking  Breads and Cereals Enriched white, wheat, rye and pumpernickel bread, hard rolls and dinner rolls; muffins, cornbread and waffles; most dry cereals, cooked cereal without added salt; unsalted crackers and breadsticks; low sodium or homemade bread crumbs Bread, rolls and crackers with salted tops; quick breads; instant hot cereals; pancakes; commercial bread stuffing; self-rising  flower and biscuit mixes; regular bread crumbs or cracker crumbs  Desserts and Sweets Desserts and sweets mad with mild should be within allowance Instant pudding mixes and cake mixes  Fats Butter or margarine; vegetable oils; unsalted salad dressings, regular salad dressings limited to 1 Tbs; light, sour and heavy cream Regular salad dressings containing bacon fat, bacon bits, and salt pork; snack dips made with instant soup mixes or processed cheese; salted nuts  Fruits Most fresh, frozen and canned fruits Fruits processed with salt or sodium-containing ingredient (some dried fruits are processed with sodium sulfites        Vegetables Fresh, frozen vegetables and low- sodium canned vegetables Regular canned vegetables, sauerkraut, pickled vegetables, and others prepared in brine; frozen vegetables in sauces; vegetables seasoned with ham, bacon or salt pork  Condiments, Sauces, Miscellaneous  Salt substitute with physician's approval; pepper, herbs, spices; vinegar, lemon or lime juice; hot pepper sauce; garlic powder, onion powder, low sodium soy sauce (1 Tbs.); low sodium condiments (ketchup, chili sauce, mustard) in limited amounts (1 tsp.) fresh ground horseradish; unsalted tortilla chips, pretzels, potato chips, popcorn, salsa (1/4 cup) Any seasoning made with salt including garlic salt, celery salt, onion salt, and seasoned salt; sea salt, rock salt, kosher salt; meat tenderizers; monosodium glutamate; mustard, regular soy sauce, barbecue, sauce, chili sauce, teriyaki sauce, steak sauce, Worcestershire sauce, and most flavored vinegars; canned gravy and mixes; regular condiments; salted snack foods, olives, picles, relish, horseradish sauce, catsup   Food preparation: Try these seasonings Meats:    Pork Sage, onion Serve with applesauce  Chicken Poultry seasoning, thyme, parsley Serve with cranberry sauce  Lamb Curry powder, rosemary, garlic, thyme Serve with mint sauce or jelly  Veal  Marjoram, basil Serve with current jelly, cranberry sauce  Beef Pepper, bay leaf Serve with dry mustard, unsalted chive butter  Fish [place] leaf, dill Serve with unsalted lemon butter, unsalted parsley butter  Vegetables:    Asparagus Lemon juice   Broccoli Lemon juice   Carrots Mustard dressing parsley, mint, nutmeg, glazed with unsalted butter and sugar   Green beans Marjoram, lemon juice, nutmeg,dill seed   Tomatoes Basil, marjoram, onion   Spice /blend for [employer]" 4 tsp ground thyme 1 tsp ground sage 3 tsp ground rosemary 4 tsp ground marjoram   Test your knowledge 1. A product that says "Salt Free" may still contain sodium. True or False 2. Garlic Powder and Hot Pepper Sauce an be used as alternative seasonings.True or False 3. Processed foods have more sodium than fresh foods.  True or False 4. Canned Vegetables have less sodium than froze True or False  WAYS TO DECREASE YOUR SODIUM INTAKE 1. Avoid the use of added salt in cooking and at the table.  Table salt (and other prepared seasonings which contain salt) is probably one of the greatest sources of sodium in the diet.  Unsalted foods can gain flavor from the sweet, sour, and butter taste sensations of herbs and spices.  Instead of using salt for seasoning, try the following seasonings with the foods listed.  Remember: how you use them to enhance natural food  flavors is limited only by your creativity... Allspice-Meat, fish, eggs, fruit, peas, red and yellow vegetables Almond Extract-Fruit baked goods Anise Seed-Sweet breads, fruit, carrots, beets, cottage cheese, cookies (tastes like licorice) Basil-Meat, fish, eggs, vegetables, rice, vegetables salads, soups, sauces Bay Leaf-Meat, fish, stews, poultry Burnet-Salad, vegetables (cucumber-like flavor) Caraway Seed-Bread, cookies, cottage cheese, meat, vegetables, cheese, rice Cardamon-Baked goods, fruit, soups Celery Powder or seed-Salads, salad dressings, sauces, meatloaf,  soup, bread.Do not use  celery salt Chervil-Meats, salads, fish, eggs, vegetables, cottage cheese (parsley-like flavor) Chili Power-Meatloaf, chicken cheese, corn, eggplant, egg dishes Chives-Salads cottage cheese, egg dishes, soups, vegetables, sauces Cilantro-Salsa, casseroles Cinnamon-Baked goods, fruit, pork, lamb, chicken, carrots Cloves-Fruit, baked goods, fish, pot roast, green beans, beets, carrots Coriander-Pastry, cookies, meat, salads, cheese (lemon-orange flavor) Cumin-Meatloaf, fish,cheese, eggs, cabbage,fruit pie (caraway flavor) [employer], fruit, eggs, fish, poultry, cottage cheese, vegetables Dill Seed-Meat, cottage cheese, poultry, vegetables, fish, salads, bread Fennel Seed-Bread, cookies, apples, pork, eggs, fish, beets, cabbage, cheese, Licorice-like flavor Garlic-(buds or powder) Salads, meat, poultry, fish, bread, butter, vegetables, potatoes.Do not  use garlic salt Ginger-Fruit, vegetables, baked goods, meat, fish, poultry Horseradish Root-Meet, vegetables, butter Lemon Juice or Extract-Vegetables, fruit, tea, baked goods, fish salads Mace-Baked goods fruit, vegetables, fish, poultry (taste like nutmeg) Maple Extract-Syrups Marjoram-Meat, chicken, fish, vegetables, breads, green salads (taste like Sage) Mint-Tea, lamb, sherbet, vegetables, desserts, carrots, cabbage Mustard, Dry or Seed-Cheese, eggs, meats, vegetables, poultry Nutmeg-Baked goods, fruit, chicken, eggs, vegetables, desserts Onion Powder-Meat, fish, poultry, vegetables, cheese, eggs, bread, rice salads (Do not use   Onion salt) Orange Extract-Desserts, baked goods Oregano-Pasta, eggs, cheese, onions, pork, lamb, fish, chicken, vegetables, green salads Paprika-Meat, fish, poultry, eggs, cheese, vegetables Parsley Flakes-Butter, vegetables, meat fish, poultry, eggs, bread, salads (certain forms may   Contain sodium Pepper-Meat fish, poultry, vegetables, eggs Peppermint Extract-Desserts, baked  goods Poppy Seed-Eggs, bread, cheese, fruit dressings, baked goods, noodles, vegetables, cottage  [employer], poultry, meat, fish, cauliflower, turnips,eggs bread Saffron-Rice, bread, veal, chicken, fish, eggs Sage-Meat, fish, poultry, onions, eggplant, tomateos, pork, stews Savory-Eggs, salads, poultry, meat, rice, vegetables, soups, pork Tarragon-Meat, poultry, fish, eggs, butter, vegetables (licorice-like flavor)  Thyme-Meat, poultry, fish, eggs, vegetables, (clover-like flavor), sauces, soups Tumeric-Salads, butter, eggs, fish, rice, vegetables (saffron-like flavor) Vanilla Extract-Baked goods, candy Vinegar-Salads, vegetables, meat marinades Walnut Extract-baked goods, candy  2. Choose your Foods Wisely   The following is a list of foods to avoid which are high in sodium:  Meats-Avoid all smoked, canned, salt cured, dried and kosher meat and fish as well as Anchovies   [employer] meats:Bologna, Liverwurst, Pastrami Canned meat or fish  Marinated herring Caviar    Pepperoni Corned Beef   Pizza Dried chipped beef  Salami Frozen breaded fish or meat Salt pork Frankfurters or hot dogs  Sardines Gefilte fish   Sausage Ham (boiled ham, Proscuitto Smoked butt    spiced ham)   Spam      TV Dinners Vegetables Canned vegetables (Regular) Relish Canned mushrooms  Sauerkraut Olives    Tomato juice Pickles  Bakery and Dessert Products Canned puddings  Cream pies Cheesecake   Decorated cakes Cookies  Beverages/Juices Tomato juice, regular  Gatorade   V-8 vegetable juice, regular  Breads and Cereals Biscuit mixes   Salted potato chips, corn chips, pretzels Bread stuffing mixes  Salted crackers and rolls Pancake and waffle mixes Self-rising flour  Seasonings Accent    Meat sauces Barbecue sauce  Meat tenderizer Catsup    Monosodium glutamate (MSG) Celery salt  Onion salt Chili sauce   Prepared mustard Garlic  salt   Salt, seasoned salt, sea salt Gravy mixes   Soy sauce Horseradish   Steak sauce Ketchup   Tartar sauce Lite salt    Teriyaki sauce Marinade mixes   Worcestershire sauce  Others Baking powder   Cocoa and cocoa mixes Baking soda   Commercial casserole mixes Candy-caramels, chocolate  Dehydrated soups    Bars, fudge,nougats  Instant rice and pasta mixes Canned broth or soup  Maraschino cherries Cheese, aged and processed cheese and cheese spreads  Learning Assessment Quiz  Indicated T (for True) or F (for False) for each of the following statements:  1. _____ Fresh fruits and vegetables and unprocessed grains are generally low in sodium 2. _____ Water may contain a considerable amount of sodium, depending on the source 3. _____ You can always tell if a food is high in sodium by tasting it 4. _____ Certain laxatives my be high in sodium and should be avoided unless prescribed   by a physician or pharmacist 5. _____ Salt substitutes may be used freely by anyone on a sodium restricted diet 6. _____ Sodium is present in table salt, food additives and as a natural component of   most foods 7. _____ Table salt is approximately 90% sodium 8. _____ Limiting sodium intake may help prevent excess fluid accumulation in the body 9. _____ On a sodium-restricted diet, seasonings such as bouillon soy sauce, and    cooking wine should be used in place of table salt 10. _____ On an ingredient list, a product which lists monosodium glutamate as the first   ingredient is an appropriate food to include on a low sodium diet  Circle the best answer(s) to the following statements (Hint: there may be more than one correct answer)  11. On a low-sodium diet, some acceptable snack items are:    A. Olives  F. Bean dip   K. Grapefruit juice    B. Salted Pretzels G. Commercial Popcorn   L. Canned peaches    C. Carrot Sticks  H. Bouillon   M. Unsalted nuts   D. [place] fries  I. Peanut butter crackers N.  Salami   E. Sweet pickles J. Tomato Juice   O. Pizza  12.  Seasonings that may be used freely on a reduced - sodium diet include   A. Lemon wedges F.Monosodium glutamate K. Celery seed    B.Soysauce   G. Pepper   L. Mustard powder   C. Sea salt  H. Cooking wine  M. Onion flakes   D. Vinegar  E. Prepared horseradish N. Salsa   E. Sage   J. Worcestershire sauce  O. Chutney

## 2020-03-05 DIAGNOSIS — I480 Paroxysmal atrial fibrillation: Secondary | ICD-10-CM | POA: Diagnosis not present

## 2020-03-05 DIAGNOSIS — I2510 Atherosclerotic heart disease of native coronary artery without angina pectoris: Secondary | ICD-10-CM | POA: Diagnosis not present

## 2020-03-05 DIAGNOSIS — H9190 Unspecified hearing loss, unspecified ear: Secondary | ICD-10-CM | POA: Diagnosis not present

## 2020-03-05 DIAGNOSIS — D509 Iron deficiency anemia, unspecified: Secondary | ICD-10-CM | POA: Diagnosis not present

## 2020-03-05 DIAGNOSIS — I6522 Occlusion and stenosis of left carotid artery: Secondary | ICD-10-CM | POA: Diagnosis not present

## 2020-03-05 DIAGNOSIS — N184 Chronic kidney disease, stage 4 (severe): Secondary | ICD-10-CM | POA: Diagnosis not present

## 2020-03-05 DIAGNOSIS — Z7901 Long term (current) use of anticoagulants: Secondary | ICD-10-CM | POA: Diagnosis not present

## 2020-03-05 DIAGNOSIS — I272 Pulmonary hypertension, unspecified: Secondary | ICD-10-CM | POA: Diagnosis not present

## 2020-03-05 DIAGNOSIS — M109 Gout, unspecified: Secondary | ICD-10-CM | POA: Diagnosis not present

## 2020-03-05 DIAGNOSIS — Z9981 Dependence on supplemental oxygen: Secondary | ICD-10-CM | POA: Diagnosis not present

## 2020-03-05 DIAGNOSIS — N138 Other obstructive and reflux uropathy: Secondary | ICD-10-CM | POA: Diagnosis not present

## 2020-03-05 DIAGNOSIS — E785 Hyperlipidemia, unspecified: Secondary | ICD-10-CM | POA: Diagnosis not present

## 2020-03-05 DIAGNOSIS — I129 Hypertensive chronic kidney disease with stage 1 through stage 4 chronic kidney disease, or unspecified chronic kidney disease: Secondary | ICD-10-CM | POA: Diagnosis not present

## 2020-03-06 ENCOUNTER — Other Ambulatory Visit: Payer: Self-pay | Admitting: *Deleted

## 2020-03-06 DIAGNOSIS — I10 Essential (primary) hypertension: Secondary | ICD-10-CM | POA: Diagnosis not present

## 2020-03-06 DIAGNOSIS — M10072 Idiopathic gout, left ankle and foot: Secondary | ICD-10-CM | POA: Diagnosis not present

## 2020-03-06 DIAGNOSIS — I4891 Unspecified atrial fibrillation: Secondary | ICD-10-CM | POA: Diagnosis not present

## 2020-03-07 DIAGNOSIS — D509 Iron deficiency anemia, unspecified: Secondary | ICD-10-CM | POA: Diagnosis not present

## 2020-03-07 DIAGNOSIS — Z9981 Dependence on supplemental oxygen: Secondary | ICD-10-CM | POA: Diagnosis not present

## 2020-03-07 DIAGNOSIS — Z7901 Long term (current) use of anticoagulants: Secondary | ICD-10-CM | POA: Diagnosis not present

## 2020-03-07 DIAGNOSIS — I129 Hypertensive chronic kidney disease with stage 1 through stage 4 chronic kidney disease, or unspecified chronic kidney disease: Secondary | ICD-10-CM | POA: Diagnosis not present

## 2020-03-07 DIAGNOSIS — H9190 Unspecified hearing loss, unspecified ear: Secondary | ICD-10-CM | POA: Diagnosis not present

## 2020-03-07 DIAGNOSIS — E785 Hyperlipidemia, unspecified: Secondary | ICD-10-CM | POA: Diagnosis not present

## 2020-03-07 DIAGNOSIS — N138 Other obstructive and reflux uropathy: Secondary | ICD-10-CM | POA: Diagnosis not present

## 2020-03-07 DIAGNOSIS — I272 Pulmonary hypertension, unspecified: Secondary | ICD-10-CM | POA: Diagnosis not present

## 2020-03-07 DIAGNOSIS — M109 Gout, unspecified: Secondary | ICD-10-CM | POA: Diagnosis not present

## 2020-03-07 DIAGNOSIS — I2510 Atherosclerotic heart disease of native coronary artery without angina pectoris: Secondary | ICD-10-CM | POA: Diagnosis not present

## 2020-03-07 DIAGNOSIS — I6522 Occlusion and stenosis of left carotid artery: Secondary | ICD-10-CM | POA: Diagnosis not present

## 2020-03-07 DIAGNOSIS — N184 Chronic kidney disease, stage 4 (severe): Secondary | ICD-10-CM | POA: Diagnosis not present

## 2020-03-07 DIAGNOSIS — I480 Paroxysmal atrial fibrillation: Secondary | ICD-10-CM | POA: Diagnosis not present

## 2020-03-07 NOTE — Patient Outreach (Signed)
Referral from Laurann Montana, RN to [place] for Medication Assistance.  LM [name]

## 2020-03-10 ENCOUNTER — Ambulatory Visit (HOSPITAL_COMMUNITY): Payer: Medicare Other | Attending: Cardiology

## 2020-03-10 ENCOUNTER — Ambulatory Visit (HOSPITAL_COMMUNITY): Payer: Medicare Other

## 2020-03-11 NOTE — Telephone Encounter (Signed)
Patient is aware and agreeable to Home Sleep Study through [facility]. Patient is scheduled for 05/29/20 at 11 am to pick up home sleep kit and meet with Respiratory therapist at [facility]. Patient is aware that if this appointment date and time does not work for them they should contact Artis Delay directly at [phone]. Patient is aware that a sleep packet will be sent from [facility] in week. Patient is agreeable to treatment and thankful for call.

## 2020-03-11 NOTE — Telephone Encounter (Signed)
Lauralee Evener, CMA  Freada Bergeron, CMA Ok to schedule HST. No PA is required. Patient has [place].          ----- Message -----  From: Antonieta Iba, RN  Sent: 02/27/2020  3:50 PM EDT  To: Freada Bergeron, CMA, [facility] Sleep Studies   Home sleep study has been ordered.  Thanks!

## 2020-03-12 ENCOUNTER — Other Ambulatory Visit: Payer: Self-pay

## 2020-03-12 DIAGNOSIS — Z7901 Long term (current) use of anticoagulants: Secondary | ICD-10-CM | POA: Diagnosis not present

## 2020-03-12 DIAGNOSIS — N183 Chronic kidney disease, stage 3 unspecified: Secondary | ICD-10-CM | POA: Diagnosis not present

## 2020-03-12 DIAGNOSIS — M109 Gout, unspecified: Secondary | ICD-10-CM | POA: Diagnosis not present

## 2020-03-12 DIAGNOSIS — E785 Hyperlipidemia, unspecified: Secondary | ICD-10-CM | POA: Diagnosis not present

## 2020-03-12 DIAGNOSIS — I2510 Atherosclerotic heart disease of native coronary artery without angina pectoris: Secondary | ICD-10-CM | POA: Diagnosis not present

## 2020-03-12 DIAGNOSIS — N138 Other obstructive and reflux uropathy: Secondary | ICD-10-CM | POA: Diagnosis not present

## 2020-03-12 DIAGNOSIS — H9190 Unspecified hearing loss, unspecified ear: Secondary | ICD-10-CM | POA: Diagnosis not present

## 2020-03-12 DIAGNOSIS — N184 Chronic kidney disease, stage 4 (severe): Secondary | ICD-10-CM | POA: Diagnosis not present

## 2020-03-12 DIAGNOSIS — Z9981 Dependence on supplemental oxygen: Secondary | ICD-10-CM | POA: Diagnosis not present

## 2020-03-12 DIAGNOSIS — D509 Iron deficiency anemia, unspecified: Secondary | ICD-10-CM | POA: Diagnosis not present

## 2020-03-12 DIAGNOSIS — I272 Pulmonary hypertension, unspecified: Secondary | ICD-10-CM | POA: Diagnosis not present

## 2020-03-12 DIAGNOSIS — I6522 Occlusion and stenosis of left carotid artery: Secondary | ICD-10-CM | POA: Diagnosis not present

## 2020-03-12 DIAGNOSIS — I129 Hypertensive chronic kidney disease with stage 1 through stage 4 chronic kidney disease, or unspecified chronic kidney disease: Secondary | ICD-10-CM | POA: Diagnosis not present

## 2020-03-12 DIAGNOSIS — I480 Paroxysmal atrial fibrillation: Secondary | ICD-10-CM | POA: Diagnosis not present

## 2020-03-12 MED ORDER — LISINOPRIL 10 MG PO TABS: 10.0000 mg | ORAL_TABLET | Freq: Two times a day (BID) | ORAL | 3 refills | Status: DC

## 2020-03-12 MED ORDER — RIVAROXABAN 20 MG PO TABS: 20.0000 mg | ORAL_TABLET | Freq: Every day | ORAL | 5 refills | Status: DC

## 2020-03-12 MED ORDER — METOPROLOL SUCCINATE ER 100 MG PO TB24: 100.0000 mg | ORAL_TABLET | Freq: Every day | ORAL | 3 refills | Status: DC

## 2020-03-12 NOTE — Telephone Encounter (Signed)
Pt last saw Ermalinda Barrios, PA on 03/04/20, last labs 03/02/20 Creat 1.51, age 79, weight 93.4kg, CrCl 54.98, based on CrCl pt is on appropriate dosage of Xarelto 20mg  QD.  Will refill rx.

## 2020-03-12 NOTE — Addendum Note (Signed)
Addended by: [place] Kitten D on: 03/12/2020 04:39 PM   Modules accepted: Orders

## 2020-03-14 ENCOUNTER — Other Ambulatory Visit: Payer: Self-pay | Admitting: Pharmacist

## 2020-03-14 DIAGNOSIS — N138 Other obstructive and reflux uropathy: Secondary | ICD-10-CM | POA: Diagnosis not present

## 2020-03-14 DIAGNOSIS — Z7901 Long term (current) use of anticoagulants: Secondary | ICD-10-CM | POA: Diagnosis not present

## 2020-03-14 DIAGNOSIS — D509 Iron deficiency anemia, unspecified: Secondary | ICD-10-CM | POA: Diagnosis not present

## 2020-03-14 DIAGNOSIS — E785 Hyperlipidemia, unspecified: Secondary | ICD-10-CM | POA: Diagnosis not present

## 2020-03-14 DIAGNOSIS — I129 Hypertensive chronic kidney disease with stage 1 through stage 4 chronic kidney disease, or unspecified chronic kidney disease: Secondary | ICD-10-CM | POA: Diagnosis not present

## 2020-03-14 DIAGNOSIS — H9190 Unspecified hearing loss, unspecified ear: Secondary | ICD-10-CM | POA: Diagnosis not present

## 2020-03-14 DIAGNOSIS — M109 Gout, unspecified: Secondary | ICD-10-CM | POA: Diagnosis not present

## 2020-03-14 DIAGNOSIS — I480 Paroxysmal atrial fibrillation: Secondary | ICD-10-CM | POA: Diagnosis not present

## 2020-03-14 DIAGNOSIS — N184 Chronic kidney disease, stage 4 (severe): Secondary | ICD-10-CM | POA: Diagnosis not present

## 2020-03-14 DIAGNOSIS — I6522 Occlusion and stenosis of left carotid artery: Secondary | ICD-10-CM | POA: Diagnosis not present

## 2020-03-14 DIAGNOSIS — Z9981 Dependence on supplemental oxygen: Secondary | ICD-10-CM | POA: Diagnosis not present

## 2020-03-14 DIAGNOSIS — I2510 Atherosclerotic heart disease of native coronary artery without angina pectoris: Secondary | ICD-10-CM | POA: Diagnosis not present

## 2020-03-14 DIAGNOSIS — I272 Pulmonary hypertension, unspecified: Secondary | ICD-10-CM | POA: Diagnosis not present

## 2020-03-14 NOTE — Patient Outreach (Signed)
[facility]) Quality Pharmacy Team     03/14/2020  [name] April 29, 1941 [id number]  Reason for referral: medication assistance  Referral source: [facility] CM Nurse Referral medication(s): Xarelto Current insurance: [employer]  HPI:  Patient is a 79 year old male with multiple medical conditions including but not limited to:  BPH, CKD stage IV, hyperlipidemia, hypertension, obesity and Afib. Patient was recently hospitalized for syncope.  Spoke with his wife, [name] HIPAA identifiers were obtained.    [name] reported they were charged $47 for Xarelto.   Objective: No Known Allergies  Medications Reviewed Today    Reviewed by Elayne Guerin, [facility] (Pharmacist) on 03/14/20 at 1556  Med List Status: <None>  Medication Order Taking? Sig Documenting Provider Last Dose Status Informant  Acetaminophen (TYLENOL) 325 MG CAPS [id number] Yes Take 325 mg by mouth daily as needed (For Pain).  [provider] Taking Active Spouse/Significant Other  Aspirin-Salicylamide-Caffeine (BC HEADACHE POWDER PO) [id number] Yes Take 1 packet by mouth daily as needed (For Headache). Takes as needed  [provider] Taking Active Spouse/Significant Other  colchicine 0.6 MG tablet [id number] Yes Take 1 tablet (0.6 mg total) by mouth daily. Thurnell Lose, MD Taking Active   hydrochlorothiazide (HYDRODIURIL) 25 MG tablet [id number] Yes Take 25 mg by mouth daily. [provider] Taking Active Spouse/Significant Other  lisinopril (ZESTRIL) 10 MG tablet [id number] Yes Take 1 tablet (10 mg total) by mouth 2 (two) times daily. Sueanne Margarita, MD Taking Active   metoprolol succinate (TOPROL XL) 100 MG 24 hr tablet [id number] Yes Take 1 tablet (100 mg total) by mouth daily. Sueanne Margarita, MD Taking Active   pantoprazole (PROTONIX) 40 MG tablet [id number] Yes Take 1 tablet (40 mg total) by mouth daily. Thurnell Lose, MD Taking Active   rivaroxaban (XARELTO) 20 MG  TABS tablet [id number] Yes Take 1 tablet (20 mg total) by mouth daily with supper. Sueanne Margarita, MD Taking Active           Medication Assistance Findings:  Medication assistance needs identified: Xarelto  Patient's wife said she picked up Xarelto and was charged the regular copay of $47. She was unsure of her husband's income and he was not available at the time of the call. Xarelto is available through [employer] and [employer]	[place]. However, after reviewing the patient's medication profile, and speaking with his wife, it became apparent he had not spent the required medication expense of 4% of household income that is required by [employer] program. Patient will most likely NOT go into the donut hole this year. Patient's wife said they were fine with paying $47.00 for the rest of the year as long as he does not get started on any other brand name medications  Medicare Part D Donut Hole concepts were reviewed and it is forecasted that the patient will most likely hit the donut hole next year.  Additional medication assistance options reviewed with patient as warranted:  No other options identified  Plan:  Patient's wife wrote down my contact information for when the patient hits the donut hole or if they have other medication related questions or concerns.  Elayne Guerin, PharmD, [place] Clinical Pharmacist [phone]

## 2020-03-17 DIAGNOSIS — Z9981 Dependence on supplemental oxygen: Secondary | ICD-10-CM | POA: Diagnosis not present

## 2020-03-17 DIAGNOSIS — I272 Pulmonary hypertension, unspecified: Secondary | ICD-10-CM | POA: Diagnosis not present

## 2020-03-17 DIAGNOSIS — I480 Paroxysmal atrial fibrillation: Secondary | ICD-10-CM | POA: Diagnosis not present

## 2020-03-17 DIAGNOSIS — Z7901 Long term (current) use of anticoagulants: Secondary | ICD-10-CM | POA: Diagnosis not present

## 2020-03-17 DIAGNOSIS — D509 Iron deficiency anemia, unspecified: Secondary | ICD-10-CM | POA: Diagnosis not present

## 2020-03-17 DIAGNOSIS — N138 Other obstructive and reflux uropathy: Secondary | ICD-10-CM | POA: Diagnosis not present

## 2020-03-17 DIAGNOSIS — M109 Gout, unspecified: Secondary | ICD-10-CM | POA: Diagnosis not present

## 2020-03-17 DIAGNOSIS — E785 Hyperlipidemia, unspecified: Secondary | ICD-10-CM | POA: Diagnosis not present

## 2020-03-17 DIAGNOSIS — I6522 Occlusion and stenosis of left carotid artery: Secondary | ICD-10-CM | POA: Diagnosis not present

## 2020-03-17 DIAGNOSIS — I2510 Atherosclerotic heart disease of native coronary artery without angina pectoris: Secondary | ICD-10-CM | POA: Diagnosis not present

## 2020-03-17 DIAGNOSIS — I129 Hypertensive chronic kidney disease with stage 1 through stage 4 chronic kidney disease, or unspecified chronic kidney disease: Secondary | ICD-10-CM | POA: Diagnosis not present

## 2020-03-17 DIAGNOSIS — N184 Chronic kidney disease, stage 4 (severe): Secondary | ICD-10-CM | POA: Diagnosis not present

## 2020-03-17 DIAGNOSIS — H9190 Unspecified hearing loss, unspecified ear: Secondary | ICD-10-CM | POA: Diagnosis not present

## 2020-03-18 DIAGNOSIS — M109 Gout, unspecified: Secondary | ICD-10-CM | POA: Diagnosis not present

## 2020-03-18 DIAGNOSIS — I129 Hypertensive chronic kidney disease with stage 1 through stage 4 chronic kidney disease, or unspecified chronic kidney disease: Secondary | ICD-10-CM | POA: Diagnosis not present

## 2020-03-18 DIAGNOSIS — I6522 Occlusion and stenosis of left carotid artery: Secondary | ICD-10-CM | POA: Diagnosis not present

## 2020-03-18 DIAGNOSIS — H9190 Unspecified hearing loss, unspecified ear: Secondary | ICD-10-CM | POA: Diagnosis not present

## 2020-03-18 DIAGNOSIS — I480 Paroxysmal atrial fibrillation: Secondary | ICD-10-CM | POA: Diagnosis not present

## 2020-03-18 DIAGNOSIS — Z7901 Long term (current) use of anticoagulants: Secondary | ICD-10-CM | POA: Diagnosis not present

## 2020-03-18 DIAGNOSIS — N184 Chronic kidney disease, stage 4 (severe): Secondary | ICD-10-CM | POA: Diagnosis not present

## 2020-03-18 DIAGNOSIS — Z9981 Dependence on supplemental oxygen: Secondary | ICD-10-CM | POA: Diagnosis not present

## 2020-03-18 DIAGNOSIS — D509 Iron deficiency anemia, unspecified: Secondary | ICD-10-CM | POA: Diagnosis not present

## 2020-03-18 DIAGNOSIS — N138 Other obstructive and reflux uropathy: Secondary | ICD-10-CM | POA: Diagnosis not present

## 2020-03-18 DIAGNOSIS — I272 Pulmonary hypertension, unspecified: Secondary | ICD-10-CM | POA: Diagnosis not present

## 2020-03-18 DIAGNOSIS — E785 Hyperlipidemia, unspecified: Secondary | ICD-10-CM | POA: Diagnosis not present

## 2020-03-18 DIAGNOSIS — I2510 Atherosclerotic heart disease of native coronary artery without angina pectoris: Secondary | ICD-10-CM | POA: Diagnosis not present

## 2020-03-19 DIAGNOSIS — I129 Hypertensive chronic kidney disease with stage 1 through stage 4 chronic kidney disease, or unspecified chronic kidney disease: Secondary | ICD-10-CM | POA: Diagnosis not present

## 2020-03-19 DIAGNOSIS — N2581 Secondary hyperparathyroidism of renal origin: Secondary | ICD-10-CM | POA: Diagnosis not present

## 2020-03-19 DIAGNOSIS — N320 Bladder-neck obstruction: Secondary | ICD-10-CM | POA: Diagnosis not present

## 2020-03-19 DIAGNOSIS — N183 Chronic kidney disease, stage 3 unspecified: Secondary | ICD-10-CM | POA: Diagnosis not present

## 2020-03-21 DIAGNOSIS — Z7901 Long term (current) use of anticoagulants: Secondary | ICD-10-CM | POA: Diagnosis not present

## 2020-03-21 DIAGNOSIS — N138 Other obstructive and reflux uropathy: Secondary | ICD-10-CM | POA: Diagnosis not present

## 2020-03-21 DIAGNOSIS — I129 Hypertensive chronic kidney disease with stage 1 through stage 4 chronic kidney disease, or unspecified chronic kidney disease: Secondary | ICD-10-CM | POA: Diagnosis not present

## 2020-03-21 DIAGNOSIS — D509 Iron deficiency anemia, unspecified: Secondary | ICD-10-CM | POA: Diagnosis not present

## 2020-03-21 DIAGNOSIS — Z9981 Dependence on supplemental oxygen: Secondary | ICD-10-CM | POA: Diagnosis not present

## 2020-03-21 DIAGNOSIS — I480 Paroxysmal atrial fibrillation: Secondary | ICD-10-CM | POA: Diagnosis not present

## 2020-03-21 DIAGNOSIS — I6522 Occlusion and stenosis of left carotid artery: Secondary | ICD-10-CM | POA: Diagnosis not present

## 2020-03-21 DIAGNOSIS — N184 Chronic kidney disease, stage 4 (severe): Secondary | ICD-10-CM | POA: Diagnosis not present

## 2020-03-21 DIAGNOSIS — I2510 Atherosclerotic heart disease of native coronary artery without angina pectoris: Secondary | ICD-10-CM | POA: Diagnosis not present

## 2020-03-21 DIAGNOSIS — M109 Gout, unspecified: Secondary | ICD-10-CM | POA: Diagnosis not present

## 2020-03-21 DIAGNOSIS — E785 Hyperlipidemia, unspecified: Secondary | ICD-10-CM | POA: Diagnosis not present

## 2020-03-21 DIAGNOSIS — H9190 Unspecified hearing loss, unspecified ear: Secondary | ICD-10-CM | POA: Diagnosis not present

## 2020-03-21 DIAGNOSIS — I272 Pulmonary hypertension, unspecified: Secondary | ICD-10-CM | POA: Diagnosis not present

## 2020-03-24 DIAGNOSIS — N184 Chronic kidney disease, stage 4 (severe): Secondary | ICD-10-CM | POA: Diagnosis not present

## 2020-03-24 DIAGNOSIS — I272 Pulmonary hypertension, unspecified: Secondary | ICD-10-CM | POA: Diagnosis not present

## 2020-03-24 DIAGNOSIS — I480 Paroxysmal atrial fibrillation: Secondary | ICD-10-CM | POA: Diagnosis not present

## 2020-03-24 DIAGNOSIS — Z9981 Dependence on supplemental oxygen: Secondary | ICD-10-CM | POA: Diagnosis not present

## 2020-03-24 DIAGNOSIS — I2510 Atherosclerotic heart disease of native coronary artery without angina pectoris: Secondary | ICD-10-CM | POA: Diagnosis not present

## 2020-03-24 DIAGNOSIS — M109 Gout, unspecified: Secondary | ICD-10-CM | POA: Diagnosis not present

## 2020-03-24 DIAGNOSIS — H9190 Unspecified hearing loss, unspecified ear: Secondary | ICD-10-CM | POA: Diagnosis not present

## 2020-03-24 DIAGNOSIS — E785 Hyperlipidemia, unspecified: Secondary | ICD-10-CM | POA: Diagnosis not present

## 2020-03-24 DIAGNOSIS — D509 Iron deficiency anemia, unspecified: Secondary | ICD-10-CM | POA: Diagnosis not present

## 2020-03-24 DIAGNOSIS — I6522 Occlusion and stenosis of left carotid artery: Secondary | ICD-10-CM | POA: Diagnosis not present

## 2020-03-24 DIAGNOSIS — Z7901 Long term (current) use of anticoagulants: Secondary | ICD-10-CM | POA: Diagnosis not present

## 2020-03-24 DIAGNOSIS — I129 Hypertensive chronic kidney disease with stage 1 through stage 4 chronic kidney disease, or unspecified chronic kidney disease: Secondary | ICD-10-CM | POA: Diagnosis not present

## 2020-03-24 DIAGNOSIS — N138 Other obstructive and reflux uropathy: Secondary | ICD-10-CM | POA: Diagnosis not present

## 2020-03-25 DIAGNOSIS — M109 Gout, unspecified: Secondary | ICD-10-CM | POA: Diagnosis not present

## 2020-03-25 DIAGNOSIS — D509 Iron deficiency anemia, unspecified: Secondary | ICD-10-CM | POA: Diagnosis not present

## 2020-03-25 DIAGNOSIS — Z9981 Dependence on supplemental oxygen: Secondary | ICD-10-CM | POA: Diagnosis not present

## 2020-03-25 DIAGNOSIS — Z7901 Long term (current) use of anticoagulants: Secondary | ICD-10-CM | POA: Diagnosis not present

## 2020-03-25 DIAGNOSIS — N138 Other obstructive and reflux uropathy: Secondary | ICD-10-CM | POA: Diagnosis not present

## 2020-03-25 DIAGNOSIS — I272 Pulmonary hypertension, unspecified: Secondary | ICD-10-CM | POA: Diagnosis not present

## 2020-03-25 DIAGNOSIS — E785 Hyperlipidemia, unspecified: Secondary | ICD-10-CM | POA: Diagnosis not present

## 2020-03-25 DIAGNOSIS — H9190 Unspecified hearing loss, unspecified ear: Secondary | ICD-10-CM | POA: Diagnosis not present

## 2020-03-25 DIAGNOSIS — N184 Chronic kidney disease, stage 4 (severe): Secondary | ICD-10-CM | POA: Diagnosis not present

## 2020-03-25 DIAGNOSIS — I6522 Occlusion and stenosis of left carotid artery: Secondary | ICD-10-CM | POA: Diagnosis not present

## 2020-03-25 DIAGNOSIS — I480 Paroxysmal atrial fibrillation: Secondary | ICD-10-CM | POA: Diagnosis not present

## 2020-03-25 DIAGNOSIS — I129 Hypertensive chronic kidney disease with stage 1 through stage 4 chronic kidney disease, or unspecified chronic kidney disease: Secondary | ICD-10-CM | POA: Diagnosis not present

## 2020-03-25 DIAGNOSIS — I2510 Atherosclerotic heart disease of native coronary artery without angina pectoris: Secondary | ICD-10-CM | POA: Diagnosis not present

## 2020-03-26 DIAGNOSIS — Z7901 Long term (current) use of anticoagulants: Secondary | ICD-10-CM | POA: Diagnosis not present

## 2020-03-26 DIAGNOSIS — I2510 Atherosclerotic heart disease of native coronary artery without angina pectoris: Secondary | ICD-10-CM | POA: Diagnosis not present

## 2020-03-26 DIAGNOSIS — H9190 Unspecified hearing loss, unspecified ear: Secondary | ICD-10-CM | POA: Diagnosis not present

## 2020-03-26 DIAGNOSIS — I129 Hypertensive chronic kidney disease with stage 1 through stage 4 chronic kidney disease, or unspecified chronic kidney disease: Secondary | ICD-10-CM | POA: Diagnosis not present

## 2020-03-26 DIAGNOSIS — E785 Hyperlipidemia, unspecified: Secondary | ICD-10-CM | POA: Diagnosis not present

## 2020-03-26 DIAGNOSIS — Z9981 Dependence on supplemental oxygen: Secondary | ICD-10-CM | POA: Diagnosis not present

## 2020-03-26 DIAGNOSIS — I272 Pulmonary hypertension, unspecified: Secondary | ICD-10-CM | POA: Diagnosis not present

## 2020-03-26 DIAGNOSIS — I6522 Occlusion and stenosis of left carotid artery: Secondary | ICD-10-CM | POA: Diagnosis not present

## 2020-03-26 DIAGNOSIS — M109 Gout, unspecified: Secondary | ICD-10-CM | POA: Diagnosis not present

## 2020-03-26 DIAGNOSIS — N138 Other obstructive and reflux uropathy: Secondary | ICD-10-CM | POA: Diagnosis not present

## 2020-03-26 DIAGNOSIS — D509 Iron deficiency anemia, unspecified: Secondary | ICD-10-CM | POA: Diagnosis not present

## 2020-03-26 DIAGNOSIS — I480 Paroxysmal atrial fibrillation: Secondary | ICD-10-CM | POA: Diagnosis not present

## 2020-03-26 DIAGNOSIS — N184 Chronic kidney disease, stage 4 (severe): Secondary | ICD-10-CM | POA: Diagnosis not present

## 2020-03-28 ENCOUNTER — Ambulatory Visit: Payer: Medicare Other | Admitting: Physician Assistant

## 2020-04-01 DIAGNOSIS — I480 Paroxysmal atrial fibrillation: Secondary | ICD-10-CM | POA: Diagnosis not present

## 2020-04-01 DIAGNOSIS — I6522 Occlusion and stenosis of left carotid artery: Secondary | ICD-10-CM | POA: Diagnosis not present

## 2020-04-01 DIAGNOSIS — Z9981 Dependence on supplemental oxygen: Secondary | ICD-10-CM | POA: Diagnosis not present

## 2020-04-01 DIAGNOSIS — I129 Hypertensive chronic kidney disease with stage 1 through stage 4 chronic kidney disease, or unspecified chronic kidney disease: Secondary | ICD-10-CM | POA: Diagnosis not present

## 2020-04-01 DIAGNOSIS — I272 Pulmonary hypertension, unspecified: Secondary | ICD-10-CM | POA: Diagnosis not present

## 2020-04-01 DIAGNOSIS — Z7901 Long term (current) use of anticoagulants: Secondary | ICD-10-CM | POA: Diagnosis not present

## 2020-04-01 DIAGNOSIS — H9190 Unspecified hearing loss, unspecified ear: Secondary | ICD-10-CM | POA: Diagnosis not present

## 2020-04-01 DIAGNOSIS — D509 Iron deficiency anemia, unspecified: Secondary | ICD-10-CM | POA: Diagnosis not present

## 2020-04-01 DIAGNOSIS — N184 Chronic kidney disease, stage 4 (severe): Secondary | ICD-10-CM | POA: Diagnosis not present

## 2020-04-01 DIAGNOSIS — R0902 Hypoxemia: Secondary | ICD-10-CM | POA: Diagnosis not present

## 2020-04-01 DIAGNOSIS — N138 Other obstructive and reflux uropathy: Secondary | ICD-10-CM | POA: Diagnosis not present

## 2020-04-01 DIAGNOSIS — I2510 Atherosclerotic heart disease of native coronary artery without angina pectoris: Secondary | ICD-10-CM | POA: Diagnosis not present

## 2020-04-01 DIAGNOSIS — E785 Hyperlipidemia, unspecified: Secondary | ICD-10-CM | POA: Diagnosis not present

## 2020-04-01 DIAGNOSIS — M109 Gout, unspecified: Secondary | ICD-10-CM | POA: Diagnosis not present

## 2020-04-03 ENCOUNTER — Telehealth: Payer: Self-pay | Admitting: Cardiology

## 2020-04-03 DIAGNOSIS — Z7901 Long term (current) use of anticoagulants: Secondary | ICD-10-CM | POA: Diagnosis not present

## 2020-04-03 DIAGNOSIS — N184 Chronic kidney disease, stage 4 (severe): Secondary | ICD-10-CM | POA: Diagnosis not present

## 2020-04-03 DIAGNOSIS — M109 Gout, unspecified: Secondary | ICD-10-CM | POA: Diagnosis not present

## 2020-04-03 DIAGNOSIS — I6522 Occlusion and stenosis of left carotid artery: Secondary | ICD-10-CM | POA: Diagnosis not present

## 2020-04-03 DIAGNOSIS — I480 Paroxysmal atrial fibrillation: Secondary | ICD-10-CM | POA: Diagnosis not present

## 2020-04-03 DIAGNOSIS — Z9981 Dependence on supplemental oxygen: Secondary | ICD-10-CM | POA: Diagnosis not present

## 2020-04-03 DIAGNOSIS — N138 Other obstructive and reflux uropathy: Secondary | ICD-10-CM | POA: Diagnosis not present

## 2020-04-03 DIAGNOSIS — D509 Iron deficiency anemia, unspecified: Secondary | ICD-10-CM | POA: Diagnosis not present

## 2020-04-03 DIAGNOSIS — E785 Hyperlipidemia, unspecified: Secondary | ICD-10-CM | POA: Diagnosis not present

## 2020-04-03 DIAGNOSIS — I272 Pulmonary hypertension, unspecified: Secondary | ICD-10-CM | POA: Diagnosis not present

## 2020-04-03 DIAGNOSIS — I2510 Atherosclerotic heart disease of native coronary artery without angina pectoris: Secondary | ICD-10-CM | POA: Diagnosis not present

## 2020-04-03 DIAGNOSIS — I129 Hypertensive chronic kidney disease with stage 1 through stage 4 chronic kidney disease, or unspecified chronic kidney disease: Secondary | ICD-10-CM | POA: Diagnosis not present

## 2020-04-03 DIAGNOSIS — H9190 Unspecified hearing loss, unspecified ear: Secondary | ICD-10-CM | POA: Diagnosis not present

## 2020-04-03 NOTE — Telephone Encounter (Signed)
I spoke with patient's wife and Moshe Salisbury and gave them information from Dr [name].

## 2020-04-03 NOTE — Telephone Encounter (Signed)
Needs to see PCP today or tomorrow and stop BC powder

## 2020-04-03 NOTE — Telephone Encounter (Signed)
I spoke with [name] (patient's home health nurse). She reports patient had nosebleed on July 6. Patient and his wife were able to get bleeding stopped. He has had some spotting from nose since that time.  Today patient noticed blood in his urine.  Cara did not see urine but wife describes as very red.  This is the first time this has happened. No UTI symptoms.  He had not urinated again when [facility] left. [name] reports patient is taking BC powder every other day for headaches.  I spoke with patient who gave me permission to speak with his wife.  She reports patient has urinated again today.  With this urination he passed some clots and then urine cleared a little.  Wife reports blood is still present but not as dark as earlier.  I advised wife that patient should stop taking BC powders. Patient was recently started on Xarelto. Will forward to Dr [name] for review/recommendations. [name] would like to be called back if any changes made

## 2020-04-03 NOTE — Telephone Encounter (Signed)
New Message   Pt c/o medication issue:  1. Name of Medication: rivaroxaban (XARELTO) 20 MG TABS tablet  2. How are you currently taking this medication (dosage and times per day)? 20mg  1 x daily   3. Are you having a reaction (difficulty breathing--STAT)? No   4. What is your medication issue? [name] is calling and says she saw the patient today and is having blood in his urine. She says on 07/06 the patient has a nose bleed and since then has had some spotting.  [name] says she did let the PCP know    Please advise

## 2020-04-04 DIAGNOSIS — M109 Gout, unspecified: Secondary | ICD-10-CM | POA: Diagnosis not present

## 2020-04-04 DIAGNOSIS — I272 Pulmonary hypertension, unspecified: Secondary | ICD-10-CM | POA: Diagnosis not present

## 2020-04-04 DIAGNOSIS — Z9981 Dependence on supplemental oxygen: Secondary | ICD-10-CM | POA: Diagnosis not present

## 2020-04-04 DIAGNOSIS — N138 Other obstructive and reflux uropathy: Secondary | ICD-10-CM | POA: Diagnosis not present

## 2020-04-04 DIAGNOSIS — I6522 Occlusion and stenosis of left carotid artery: Secondary | ICD-10-CM | POA: Diagnosis not present

## 2020-04-04 DIAGNOSIS — N184 Chronic kidney disease, stage 4 (severe): Secondary | ICD-10-CM | POA: Diagnosis not present

## 2020-04-04 DIAGNOSIS — D509 Iron deficiency anemia, unspecified: Secondary | ICD-10-CM | POA: Diagnosis not present

## 2020-04-04 DIAGNOSIS — E785 Hyperlipidemia, unspecified: Secondary | ICD-10-CM | POA: Diagnosis not present

## 2020-04-04 DIAGNOSIS — H9190 Unspecified hearing loss, unspecified ear: Secondary | ICD-10-CM | POA: Diagnosis not present

## 2020-04-04 DIAGNOSIS — I480 Paroxysmal atrial fibrillation: Secondary | ICD-10-CM | POA: Diagnosis not present

## 2020-04-04 DIAGNOSIS — Z7901 Long term (current) use of anticoagulants: Secondary | ICD-10-CM | POA: Diagnosis not present

## 2020-04-04 DIAGNOSIS — I2510 Atherosclerotic heart disease of native coronary artery without angina pectoris: Secondary | ICD-10-CM | POA: Diagnosis not present

## 2020-04-04 DIAGNOSIS — I129 Hypertensive chronic kidney disease with stage 1 through stage 4 chronic kidney disease, or unspecified chronic kidney disease: Secondary | ICD-10-CM | POA: Diagnosis not present

## 2020-04-07 DIAGNOSIS — E785 Hyperlipidemia, unspecified: Secondary | ICD-10-CM | POA: Diagnosis not present

## 2020-04-07 DIAGNOSIS — D509 Iron deficiency anemia, unspecified: Secondary | ICD-10-CM | POA: Diagnosis not present

## 2020-04-07 DIAGNOSIS — I6522 Occlusion and stenosis of left carotid artery: Secondary | ICD-10-CM | POA: Diagnosis not present

## 2020-04-07 DIAGNOSIS — I480 Paroxysmal atrial fibrillation: Secondary | ICD-10-CM | POA: Diagnosis not present

## 2020-04-07 DIAGNOSIS — I272 Pulmonary hypertension, unspecified: Secondary | ICD-10-CM | POA: Diagnosis not present

## 2020-04-07 DIAGNOSIS — I129 Hypertensive chronic kidney disease with stage 1 through stage 4 chronic kidney disease, or unspecified chronic kidney disease: Secondary | ICD-10-CM | POA: Diagnosis not present

## 2020-04-07 DIAGNOSIS — I2510 Atherosclerotic heart disease of native coronary artery without angina pectoris: Secondary | ICD-10-CM | POA: Diagnosis not present

## 2020-04-07 DIAGNOSIS — N138 Other obstructive and reflux uropathy: Secondary | ICD-10-CM | POA: Diagnosis not present

## 2020-04-07 DIAGNOSIS — Z7901 Long term (current) use of anticoagulants: Secondary | ICD-10-CM | POA: Diagnosis not present

## 2020-04-07 DIAGNOSIS — M109 Gout, unspecified: Secondary | ICD-10-CM | POA: Diagnosis not present

## 2020-04-07 DIAGNOSIS — Z9981 Dependence on supplemental oxygen: Secondary | ICD-10-CM | POA: Diagnosis not present

## 2020-04-07 DIAGNOSIS — H9190 Unspecified hearing loss, unspecified ear: Secondary | ICD-10-CM | POA: Diagnosis not present

## 2020-04-07 DIAGNOSIS — N184 Chronic kidney disease, stage 4 (severe): Secondary | ICD-10-CM | POA: Diagnosis not present

## 2020-04-09 DIAGNOSIS — R319 Hematuria, unspecified: Secondary | ICD-10-CM | POA: Diagnosis not present

## 2020-04-10 DIAGNOSIS — I272 Pulmonary hypertension, unspecified: Secondary | ICD-10-CM | POA: Diagnosis not present

## 2020-04-10 DIAGNOSIS — N138 Other obstructive and reflux uropathy: Secondary | ICD-10-CM | POA: Diagnosis not present

## 2020-04-10 DIAGNOSIS — H9190 Unspecified hearing loss, unspecified ear: Secondary | ICD-10-CM | POA: Diagnosis not present

## 2020-04-10 DIAGNOSIS — I2510 Atherosclerotic heart disease of native coronary artery without angina pectoris: Secondary | ICD-10-CM | POA: Diagnosis not present

## 2020-04-10 DIAGNOSIS — I6522 Occlusion and stenosis of left carotid artery: Secondary | ICD-10-CM | POA: Diagnosis not present

## 2020-04-10 DIAGNOSIS — N184 Chronic kidney disease, stage 4 (severe): Secondary | ICD-10-CM | POA: Diagnosis not present

## 2020-04-10 DIAGNOSIS — E785 Hyperlipidemia, unspecified: Secondary | ICD-10-CM | POA: Diagnosis not present

## 2020-04-10 DIAGNOSIS — Z7901 Long term (current) use of anticoagulants: Secondary | ICD-10-CM | POA: Diagnosis not present

## 2020-04-10 DIAGNOSIS — I480 Paroxysmal atrial fibrillation: Secondary | ICD-10-CM | POA: Diagnosis not present

## 2020-04-10 DIAGNOSIS — M109 Gout, unspecified: Secondary | ICD-10-CM | POA: Diagnosis not present

## 2020-04-10 DIAGNOSIS — Z9981 Dependence on supplemental oxygen: Secondary | ICD-10-CM | POA: Diagnosis not present

## 2020-04-10 DIAGNOSIS — I129 Hypertensive chronic kidney disease with stage 1 through stage 4 chronic kidney disease, or unspecified chronic kidney disease: Secondary | ICD-10-CM | POA: Diagnosis not present

## 2020-04-10 DIAGNOSIS — D509 Iron deficiency anemia, unspecified: Secondary | ICD-10-CM | POA: Diagnosis not present

## 2020-04-11 DIAGNOSIS — N184 Chronic kidney disease, stage 4 (severe): Secondary | ICD-10-CM | POA: Diagnosis not present

## 2020-04-11 DIAGNOSIS — I480 Paroxysmal atrial fibrillation: Secondary | ICD-10-CM | POA: Diagnosis not present

## 2020-04-11 DIAGNOSIS — M109 Gout, unspecified: Secondary | ICD-10-CM | POA: Diagnosis not present

## 2020-04-11 DIAGNOSIS — D509 Iron deficiency anemia, unspecified: Secondary | ICD-10-CM | POA: Diagnosis not present

## 2020-04-11 DIAGNOSIS — E785 Hyperlipidemia, unspecified: Secondary | ICD-10-CM | POA: Diagnosis not present

## 2020-04-11 DIAGNOSIS — I272 Pulmonary hypertension, unspecified: Secondary | ICD-10-CM | POA: Diagnosis not present

## 2020-04-11 DIAGNOSIS — N138 Other obstructive and reflux uropathy: Secondary | ICD-10-CM | POA: Diagnosis not present

## 2020-04-11 DIAGNOSIS — I2510 Atherosclerotic heart disease of native coronary artery without angina pectoris: Secondary | ICD-10-CM | POA: Diagnosis not present

## 2020-04-11 DIAGNOSIS — H9190 Unspecified hearing loss, unspecified ear: Secondary | ICD-10-CM | POA: Diagnosis not present

## 2020-04-11 DIAGNOSIS — I129 Hypertensive chronic kidney disease with stage 1 through stage 4 chronic kidney disease, or unspecified chronic kidney disease: Secondary | ICD-10-CM | POA: Diagnosis not present

## 2020-04-11 DIAGNOSIS — Z9981 Dependence on supplemental oxygen: Secondary | ICD-10-CM | POA: Diagnosis not present

## 2020-04-11 DIAGNOSIS — I6522 Occlusion and stenosis of left carotid artery: Secondary | ICD-10-CM | POA: Diagnosis not present

## 2020-04-11 DIAGNOSIS — Z7901 Long term (current) use of anticoagulants: Secondary | ICD-10-CM | POA: Diagnosis not present

## 2020-04-15 DIAGNOSIS — I480 Paroxysmal atrial fibrillation: Secondary | ICD-10-CM | POA: Diagnosis not present

## 2020-04-15 DIAGNOSIS — N138 Other obstructive and reflux uropathy: Secondary | ICD-10-CM | POA: Diagnosis not present

## 2020-04-15 DIAGNOSIS — I129 Hypertensive chronic kidney disease with stage 1 through stage 4 chronic kidney disease, or unspecified chronic kidney disease: Secondary | ICD-10-CM | POA: Diagnosis not present

## 2020-04-15 DIAGNOSIS — Z9981 Dependence on supplemental oxygen: Secondary | ICD-10-CM | POA: Diagnosis not present

## 2020-04-15 DIAGNOSIS — D509 Iron deficiency anemia, unspecified: Secondary | ICD-10-CM | POA: Diagnosis not present

## 2020-04-15 DIAGNOSIS — H9190 Unspecified hearing loss, unspecified ear: Secondary | ICD-10-CM | POA: Diagnosis not present

## 2020-04-15 DIAGNOSIS — I272 Pulmonary hypertension, unspecified: Secondary | ICD-10-CM | POA: Diagnosis not present

## 2020-04-15 DIAGNOSIS — E785 Hyperlipidemia, unspecified: Secondary | ICD-10-CM | POA: Diagnosis not present

## 2020-04-15 DIAGNOSIS — I6522 Occlusion and stenosis of left carotid artery: Secondary | ICD-10-CM | POA: Diagnosis not present

## 2020-04-15 DIAGNOSIS — Z7901 Long term (current) use of anticoagulants: Secondary | ICD-10-CM | POA: Diagnosis not present

## 2020-04-15 DIAGNOSIS — I2510 Atherosclerotic heart disease of native coronary artery without angina pectoris: Secondary | ICD-10-CM | POA: Diagnosis not present

## 2020-04-15 DIAGNOSIS — N184 Chronic kidney disease, stage 4 (severe): Secondary | ICD-10-CM | POA: Diagnosis not present

## 2020-04-15 DIAGNOSIS — M109 Gout, unspecified: Secondary | ICD-10-CM | POA: Diagnosis not present

## 2020-04-18 DIAGNOSIS — Z9981 Dependence on supplemental oxygen: Secondary | ICD-10-CM | POA: Diagnosis not present

## 2020-04-18 DIAGNOSIS — I480 Paroxysmal atrial fibrillation: Secondary | ICD-10-CM | POA: Diagnosis not present

## 2020-04-18 DIAGNOSIS — D509 Iron deficiency anemia, unspecified: Secondary | ICD-10-CM | POA: Diagnosis not present

## 2020-04-18 DIAGNOSIS — E785 Hyperlipidemia, unspecified: Secondary | ICD-10-CM | POA: Diagnosis not present

## 2020-04-18 DIAGNOSIS — Z7901 Long term (current) use of anticoagulants: Secondary | ICD-10-CM | POA: Diagnosis not present

## 2020-04-18 DIAGNOSIS — I6522 Occlusion and stenosis of left carotid artery: Secondary | ICD-10-CM | POA: Diagnosis not present

## 2020-04-18 DIAGNOSIS — I272 Pulmonary hypertension, unspecified: Secondary | ICD-10-CM | POA: Diagnosis not present

## 2020-04-18 DIAGNOSIS — N138 Other obstructive and reflux uropathy: Secondary | ICD-10-CM | POA: Diagnosis not present

## 2020-04-18 DIAGNOSIS — M109 Gout, unspecified: Secondary | ICD-10-CM | POA: Diagnosis not present

## 2020-04-18 DIAGNOSIS — N184 Chronic kidney disease, stage 4 (severe): Secondary | ICD-10-CM | POA: Diagnosis not present

## 2020-04-18 DIAGNOSIS — I129 Hypertensive chronic kidney disease with stage 1 through stage 4 chronic kidney disease, or unspecified chronic kidney disease: Secondary | ICD-10-CM | POA: Diagnosis not present

## 2020-04-18 DIAGNOSIS — H9190 Unspecified hearing loss, unspecified ear: Secondary | ICD-10-CM | POA: Diagnosis not present

## 2020-04-18 DIAGNOSIS — I2510 Atherosclerotic heart disease of native coronary artery without angina pectoris: Secondary | ICD-10-CM | POA: Diagnosis not present

## 2020-04-21 ENCOUNTER — Telehealth: Payer: Self-pay | Admitting: Cardiology

## 2020-04-21 NOTE — Telephone Encounter (Signed)
New message  [name] for [employer] [facility] is calling in about the imaging that is to be done tomorrow. A new order is needed because the vent portion of the imaging is no longer done only the perfusion part. [name] will be editing the order in the system and needs Dr. Theodosia Blender signature. Please assist.

## 2020-04-22 ENCOUNTER — Other Ambulatory Visit: Payer: Self-pay | Admitting: *Deleted

## 2020-04-22 ENCOUNTER — Ambulatory Visit (INDEPENDENT_AMBULATORY_CARE_PROVIDER_SITE_OTHER): Payer: Medicare Other | Admitting: Internal Medicine

## 2020-04-22 ENCOUNTER — Other Ambulatory Visit: Payer: Self-pay

## 2020-04-22 ENCOUNTER — Ambulatory Visit (HOSPITAL_COMMUNITY)
Admission: RE | Admit: 2020-04-22 | Discharge: 2020-04-22 | Disposition: A | Discharge status: Home / Self Care | Payer: Medicare Other | Source: Ambulatory Visit | Attending: Cardiology | Admitting: Cardiology

## 2020-04-22 ENCOUNTER — Encounter (HOSPITAL_COMMUNITY)
Admission: RE | Admit: 2020-04-22 | Discharge: 2020-04-22 | Disposition: A | Discharge status: Home / Self Care | Payer: Medicare Other | Source: Ambulatory Visit | Attending: Cardiology | Admitting: Cardiology

## 2020-04-22 DIAGNOSIS — J986 Disorders of diaphragm: Secondary | ICD-10-CM | POA: Diagnosis not present

## 2020-04-22 DIAGNOSIS — I272 Pulmonary hypertension, unspecified: Secondary | ICD-10-CM

## 2020-04-22 DIAGNOSIS — I712 Thoracic aortic aneurysm, without rupture, unspecified: Secondary | ICD-10-CM

## 2020-04-22 DIAGNOSIS — K449 Diaphragmatic hernia without obstruction or gangrene: Secondary | ICD-10-CM | POA: Diagnosis not present

## 2020-04-22 DIAGNOSIS — M47814 Spondylosis without myelopathy or radiculopathy, thoracic region: Secondary | ICD-10-CM | POA: Diagnosis not present

## 2020-04-22 DIAGNOSIS — I270 Primary pulmonary hypertension: Secondary | ICD-10-CM | POA: Diagnosis not present

## 2020-04-22 LAB — PULMONARY FUNCTION TEST
DL/VA % pred: 124 %
DL/VA: 5.05 ml/min/mmHg/L
DLCO cor % pred: 84 %
DLCO cor: 18.04 ml/min/mmHg
DLCO unc % pred: 84 %
DLCO unc: 18.04 ml/min/mmHg
FEF 25-75 Post: 2.24 L/sec
FEF 25-75 Pre: 1.3 L/sec
FEF2575-%Change-Post: 72 %
FEF2575-%Pred-Post: 129 %
FEF2575-%Pred-Pre: 75 %
FEV1-%Change-Post: 13 %
FEV1-%Pred-Post: 81 %
FEV1-%Pred-Pre: 71 %
FEV1-Post: 1.96 L
FEV1-Pre: 1.72 L
FEV1FVC-%Change-Post: -2 %
FEV1FVC-%Pred-Pre: 110 %
FEV6-%Change-Post: 16 %
FEV6-%Pred-Post: 79 %
FEV6-%Pred-Pre: 68 %
FEV6-Post: 2.51 L
FEV6-Pre: 2.15 L
FEV6FVC-%Pred-Post: 107 %
FEV6FVC-%Pred-Pre: 107 %
FVC-%Change-Post: 16 %
FVC-%Pred-Post: 74 %
FVC-%Pred-Pre: 63 %
FVC-Post: 2.51 L
FVC-Pre: 2.15 L
Post FEV1/FVC ratio: 78 %
Post FEV6/FVC ratio: 100 %
Pre FEV1/FVC ratio: 80 %
Pre FEV6/FVC Ratio: 100 %

## 2020-04-22 MED ORDER — TECHNETIUM TO 99M ALBUMIN AGGREGATED
3.8800 | Freq: Once | INTRAVENOUS | Status: AC
  Administered 2020-04-22: 3.88 via INTRAVENOUS

## 2020-04-22 NOTE — Progress Notes (Signed)
Spirometry pre and post and DLCO performed.. Patient was unable to complete N2 or pleth due to hearing and comprehension of directions.

## 2020-04-23 ENCOUNTER — Other Ambulatory Visit: Payer: Self-pay

## 2020-04-23 ENCOUNTER — Ambulatory Visit
Admission: EM | Admit: 2020-04-23 | Discharge: 2020-04-23 | Disposition: A | Discharge status: Home / Self Care | Payer: Medicare Other

## 2020-04-23 ENCOUNTER — Emergency Department (HOSPITAL_COMMUNITY)
Admission: EM | Admit: 2020-04-23 | Discharge: 2020-04-23 | Disposition: A | Discharge status: Home / Self Care | Payer: Medicare Other | Attending: Emergency Medicine | Admitting: Emergency Medicine

## 2020-04-23 ENCOUNTER — Encounter (HOSPITAL_COMMUNITY): Payer: Self-pay

## 2020-04-23 DIAGNOSIS — R5381 Other malaise: Secondary | ICD-10-CM | POA: Diagnosis not present

## 2020-04-23 DIAGNOSIS — Z79899 Other long term (current) drug therapy: Secondary | ICD-10-CM | POA: Insufficient documentation

## 2020-04-23 DIAGNOSIS — R310 Gross hematuria: Secondary | ICD-10-CM

## 2020-04-23 DIAGNOSIS — I4891 Unspecified atrial fibrillation: Secondary | ICD-10-CM

## 2020-04-23 DIAGNOSIS — Z743 Need for continuous supervision: Secondary | ICD-10-CM | POA: Diagnosis not present

## 2020-04-23 DIAGNOSIS — I499 Cardiac arrhythmia, unspecified: Secondary | ICD-10-CM | POA: Diagnosis not present

## 2020-04-23 DIAGNOSIS — Z7901 Long term (current) use of anticoagulants: Secondary | ICD-10-CM | POA: Diagnosis not present

## 2020-04-23 DIAGNOSIS — R000 Tachycardia, unspecified: Secondary | ICD-10-CM | POA: Diagnosis not present

## 2020-04-23 DIAGNOSIS — I10 Essential (primary) hypertension: Secondary | ICD-10-CM | POA: Insufficient documentation

## 2020-04-23 DIAGNOSIS — R58 Hemorrhage, not elsewhere classified: Secondary | ICD-10-CM | POA: Diagnosis not present

## 2020-04-23 LAB — CBC WITH DIFFERENTIAL/PLATELET
Abs Immature Granulocytes: 0.04 10*3/uL (ref 0.00–0.07)
Basophils Absolute: 0 10*3/uL (ref 0.0–0.1)
Basophils Relative: 0 %
Eosinophils Absolute: 0.1 10*3/uL (ref 0.0–0.5)
Eosinophils Relative: 1 %
HCT: 35.6 % — ABNORMAL LOW (ref 39.0–52.0)
Hemoglobin: 11.2 g/dL — ABNORMAL LOW (ref 13.0–17.0)
Immature Granulocytes: 1 %
Lymphocytes Relative: 15 %
Lymphs Abs: 1.1 10*3/uL (ref 0.7–4.0)
MCH: 28.7 pg (ref 26.0–34.0)
MCHC: 31.5 g/dL (ref 30.0–36.0)
MCV: 91.3 fL (ref 80.0–100.0)
Monocytes Absolute: 0.5 10*3/uL (ref 0.1–1.0)
Monocytes Relative: 7 %
Neutro Abs: 5.9 10*3/uL (ref 1.7–7.7)
Neutrophils Relative %: 76 %
Platelets: 282 10*3/uL (ref 150–400)
RBC: 3.9 MIL/uL — ABNORMAL LOW (ref 4.22–5.81)
RDW: 14.6 % (ref 11.5–15.5)
WBC: 7.7 10*3/uL (ref 4.0–10.5)
nRBC: 0 % (ref 0.0–0.2)

## 2020-04-23 LAB — URINALYSIS, ROUTINE W REFLEX MICROSCOPIC

## 2020-04-23 LAB — BASIC METABOLIC PANEL
Anion gap: 11 (ref 5–15)
BUN: 52 mg/dL — ABNORMAL HIGH (ref 8–23)
CO2: 25 mmol/L (ref 22–32)
Calcium: 9.1 mg/dL (ref 8.9–10.3)
Chloride: 103 mmol/L (ref 98–111)
Creatinine, Ser: 2.15 mg/dL — ABNORMAL HIGH (ref 0.61–1.24)
GFR calc Af Amer: 33 mL/min — ABNORMAL LOW (ref >60)
GFR calc non Af Amer: 29 mL/min — ABNORMAL LOW (ref >60)
Glucose, Bld: 149 mg/dL — ABNORMAL HIGH (ref 70–99)
Potassium: 3.5 mmol/L (ref 3.5–5.1)
Sodium: 139 mmol/L (ref 135–145)

## 2020-04-23 LAB — I-STAT CHEM 8, ED
BUN: 47 mg/dL — ABNORMAL HIGH (ref 8–23)
Calcium, Ion: 1.14 mmol/L — ABNORMAL LOW (ref 1.15–1.40)
Chloride: 101 mmol/L (ref 98–111)
Creatinine, Ser: 2.1 mg/dL — ABNORMAL HIGH (ref 0.61–1.24)
Glucose, Bld: 177 mg/dL — ABNORMAL HIGH (ref 70–99)
HCT: 34 % — ABNORMAL LOW (ref 39.0–52.0)
Hemoglobin: 11.6 g/dL — ABNORMAL LOW (ref 13.0–17.0)
Potassium: 3.1 mmol/L — ABNORMAL LOW (ref 3.5–5.1)
Sodium: 143 mmol/L (ref 135–145)
TCO2: 24 mmol/L (ref 22–32)

## 2020-04-23 LAB — HEPATIC FUNCTION PANEL
ALT: 20 U/L (ref 0–44)
AST: 20 U/L (ref 15–41)
Albumin: 4 g/dL (ref 3.5–5.0)
Alkaline Phosphatase: 61 U/L (ref 38–126)
Bilirubin, Direct: <0.1 mg/dL (ref 0.0–0.2)
Total Bilirubin: 0.5 mg/dL (ref 0.3–1.2)
Total Protein: 7.1 g/dL (ref 6.5–8.1)

## 2020-04-23 LAB — URINALYSIS, MICROSCOPIC (REFLEX)
RBC / HPF: >50 RBC/hpf (ref 0–5)
Squamous Epithelial / HPF: NONE SEEN (ref 0–5)

## 2020-04-23 LAB — PROTIME-INR
INR: 2.6 — ABNORMAL HIGH (ref 0.8–1.2)
Prothrombin Time: 26.9 seconds — ABNORMAL HIGH (ref 11.4–15.2)

## 2020-04-23 MED ORDER — SODIUM CHLORIDE 0.9 % IV BOLUS
500.0000 mL | Freq: Once | INTRAVENOUS | Status: AC
  Administered 2020-04-23: 500 mL via INTRAVENOUS

## 2020-04-23 MED ORDER — FENTANYL CITRATE (PF) 100 MCG/2ML IJ SOLN
50.0000 ug | Freq: Once | INTRAMUSCULAR | Status: AC
  Administered 2020-04-23: 50 ug via INTRAVENOUS
  Filled 2020-04-23: qty 2

## 2020-04-23 MED ORDER — SODIUM CHLORIDE 0.9 % IR SOLN
3000.0000 mL | Status: DC
  Administered 2020-04-23: 3000 mL
  Administered 2020-04-23: 1000 mL

## 2020-04-23 MED ORDER — METOPROLOL TARTRATE 5 MG/5ML IV SOLN
5.0000 mg | Freq: Once | INTRAVENOUS | Status: AC
  Administered 2020-04-23: 5 mg via INTRAVENOUS
  Filled 2020-04-23: qty 5

## 2020-04-23 NOTE — ED Provider Notes (Signed)
[facility]    CSN: [id number] Arrival date & time: 04/23/20  1053      History   Chief Complaint Chief Complaint  Patient presents with  . bleeding from penis    HPI Alyxander W Kaian Fahs. is a 79 y.o. male with extensive medical history as outlined below including chronic anticoagulant use due to persistent atrial fibrillation, PE, CKD presenting for gross hematuria.  States he noticed bleeding from his penis yesterday.  Denies severe abdominal pain or chest pain, shortness of breath.  Does feel "unwell ".  Has not tried thing for this.    Past Medical History:  Diagnosis Date  . Benign localized hyperplasia of prostate with urinary obstruction 07/21/2015  . CAP (community acquired pneumonia) 04/14/2015  . Carotid stenosis    left  . CKD (chronic kidney disease) stage 4, GFR 15-29 ml/min (HCC) 04/14/2015  . Complication of anesthesia   . Elevated troponin 04/14/2015  . Essential hypertension 04/14/2015  . Gram-negative bacteremia 04/15/2015  . Headache    migraines  . Hearing loss   . Hematuria 04/19/2015  . HLD (hyperlipidemia)   . Normocytic hypochromic anemia 04/14/2015  . PAF (paroxysmal atrial fibrillation) ([place]) 04/14/2015  . PONV (postoperative nausea and vomiting)   . Sepsis secondary to UTI ([place]) 04/23/2015  . Transfusion history    last 04-24-15  . Urinary retention     Patient Active Problem List   Diagnosis Date Noted  . Syncope 02/28/2020  . Left foot pain 02/28/2020  . Obesity (BMI 30.0-34.9) 02/28/2020  . Carotid stenosis   . HLD (hyperlipidemia)   . Benign prostatic hypertrophy 11/07/2015  . Benign localized hyperplasia of prostate with urinary obstruction 07/21/2015  . Sepsis secondary to UTI ([place]) 04/23/2015  . Hematuria 04/19/2015  . Gram-negative bacteremia 04/15/2015  . AKI (acute kidney injury) ([place]) 04/15/2015  . Acute renal failure ([place])   . Atrial fibrillation with RVR ([place]) 04/14/2015  . CAP (community acquired pneumonia)  04/14/2015  . Essential hypertension 04/14/2015  . Sepsis ([place]) 04/14/2015  . Elevated troponin 04/14/2015  . Normocytic hypochromic anemia 04/14/2015  . CKD (chronic kidney disease) stage 4, GFR 15-29 ml/min (HCC) 04/14/2015    Past Surgical History:  Procedure Laterality Date  . ESOPHAGOGASTRODUODENOSCOPY N/A 04/23/2015   Procedure: ESOPHAGOGASTRODUODENOSCOPY (EGD);  Surgeon: Carol Ada, MD;  Location: [facility] ENDOSCOPY;  Service: Endoscopy;  Laterality: N/A;  . shots behind eye     right  . TONSILLECTOMY    . TRANSURETHRAL RESECTION OF PROSTATE N/A 07/21/2015   Procedure: TRANSURETHRAL RESECTION OF THE PROSTATE (TURP) WITH CHIPS;  Surgeon: Carolan Clines, MD;  Location: [facility] ORS;  Service: Urology;  Laterality: N/A;  . TRANSURETHRAL RESECTION OF PROSTATE N/A 11/07/2015   Procedure: TRANSURETHRAL RESECTION OF THE PROSTATE (TURP);  Surgeon: Carolan Clines, MD;  Location: [facility] ORS;  Service: Urology;  Laterality: N/A;  . VASECTOMY         Home Medications    Prior to Admission medications   Medication Sig Start Date End Date Taking? Authorizing Provider  Acetaminophen (TYLENOL) 325 MG CAPS Take 325 mg by mouth daily as needed (For Pain).     [provider]  Aspirin-Salicylamide-Caffeine (BC HEADACHE POWDER PO) Take 1 packet by mouth daily as needed (For Headache). Takes as needed     [provider]  colchicine 0.6 MG tablet Take 1 tablet (0.6 mg total) by mouth daily. 03/03/20   Thurnell Lose, MD  hydrochlorothiazide (HYDRODIURIL) 25 MG tablet Take 25 mg  by mouth daily. 01/21/20   [provider]  lisinopril (ZESTRIL) 10 MG tablet Take 1 tablet (10 mg total) by mouth 2 (two) times daily. 03/12/20   Sueanne Margarita, MD  metoprolol succinate (TOPROL XL) 100 MG 24 hr tablet Take 1 tablet (100 mg total) by mouth daily. 03/12/20   Sueanne Margarita, MD  pantoprazole (PROTONIX) 40 MG tablet Take 1 tablet (40 mg total) by mouth daily. 03/03/20   Thurnell Lose,  MD  rivaroxaban (XARELTO) 20 MG TABS tablet Take 1 tablet (20 mg total) by mouth daily with supper. 03/12/20   Sueanne Margarita, MD    Family History Family History  Problem Relation Age of Onset  . Breast cancer Mother     Social History Social History   Tobacco Use  . Smoking status: Never Smoker  . Smokeless tobacco: Never Used  Substance Use Topics  . Alcohol use: No  . Drug use: No     Allergies   Patient has no known allergies.   Review of Systems As per HPI   Physical Exam Triage Vital Signs ED Triage Vitals  Enc Vitals Group     BP      Pulse      Resp      Temp      Temp src      SpO2      Weight      Height      Head Circumference      Peak Flow      Pain Score      Pain Loc      Pain Edu?      Excl. in [place]?    No data found.  Updated Vital Signs BP (!) 144/96 (BP Location: Left Arm)   Pulse 95   Temp 98.4 F (36.9 C) (Oral)   Resp 20   SpO2 95%   Visual Acuity Right Eye Distance:   Left Eye Distance:   Bilateral Distance:    Right Eye Near:   Left Eye Near:    Bilateral Near:     Physical Exam Constitutional:      General: He is not in acute distress.    Appearance: He is ill-appearing. He is not diaphoretic.  HENT:     Head: Normocephalic and atraumatic.  Eyes:     General: No scleral icterus.    Pupils: Pupils are equal, round, and reactive to light.  Neck:     Comments: Negative JVD Cardiovascular:     Rate and Rhythm: Tachycardia present. Rhythm irregular.  Pulmonary:     Effort: No respiratory distress.     Breath sounds: No wheezing.     Comments: Increased effort Abdominal:     Tenderness: There is no abdominal tenderness.     Comments: Patient denies distention  Genitourinary:    Comments: Uncircumcised penis without lesions, though when retracting foreskin blood noted. Skin:    Capillary Refill: Capillary refill takes 2 to 3 seconds.     Coloration: Skin is pale. Skin is not jaundiced.     Findings: No  bruising.  Neurological:     Mental Status: He is alert and oriented to person, place, and time.      UC Treatments / Results  Labs (all labs ordered are listed, but only abnormal results are displayed) Labs Reviewed - No data to display  EKG   Radiology DG Chest 2 View  Result Date: 04/22/2020 CLINICAL DATA:  Thoracic aortic aneurysm.  EXAM: CHEST - 2 VIEW COMPARISON:  None. FINDINGS: There is no evidence of acute infiltrate, pleural effusion or pneumothorax. A large gastric hernia is seen overlying the left lung base. The cardiac silhouette is mildly enlarged. Degenerative changes seen throughout the thoracic spine. IMPRESSION: 1. No acute cardiopulmonary disease. 2. Large gastric hernia. Electronically Signed   By: Virgina Norfolk M.D.   On: 04/22/2020 20:22   NM Pulmonary Perfusion  Result Date: 04/22/2020 CLINICAL DATA:  Pulmonary hypertension. Concern for chronic pulmonary embolism. EXAM: NUCLEAR MEDICINE PERFUSION LUNG SCAN TECHNIQUE: Perfusion images were obtained in multiple projections after intravenous injection of radiopharmaceutical. RADIOPHARMACEUTICALS:  3.9 mCi Tc-52m MAA COMPARISON:  Radiograph 04/22/2020 FINDINGS: No wedge-shaped peripheral perfusion defects within the LEFT or RIGHT lung to suggest acute pulmonary embolism. Poor perfusion pattern to the LEFT lung relates to elevated LEFT hemidiaphragm. IMPRESSION: No evidence acute or chronic pulmonary embolism. Electronically Signed   By: Suzy Bouchard M.D.   On: 04/22/2020 16:32    Procedures Procedures (including critical care time)  Medications Ordered in UC Medications - No data to display  Initial Impression / Assessment and Plan / UC Course  I have reviewed the triage vital signs and the nursing notes.  Pertinent labs & imaging results that were available during my care of the patient were reviewed by me and considered in my medical decision making (see chart for details).     Afebrile, nontoxic, and  without acute respiratory distress (though does have labored breathing).  Appears somewhat pale.  EKG done office, reviewed by me: A. fib with RVR.  Ventricular rate 122 bpm.  PVC noted.  Possible inferior ischemia.  Reviewed patient's clinical picture with both patient and then wife at [facility].  Recommended ER for further evaluation: Transported via EMS in stable condition. Final Clinical Impressions(s) / UC Diagnoses   Final diagnoses:  Atrial fibrillation with RVR ([place])  Gross hematuria  Malaise  Long term current use of anticoagulant therapy   Discharge Instructions   None    ED Prescriptions    None     PDMP not reviewed this encounter.   [place], [place], [place] 04/23/20 1150

## 2020-04-23 NOTE — ED Triage Notes (Signed)
Pt bib ems, pt being blood x2 days. Hx afib, on Xarelto. 178/94 110 hr afib, 18 rr,

## 2020-04-23 NOTE — ED Notes (Signed)
Pt provided urinal.

## 2020-04-23 NOTE — ED Notes (Signed)
Bladder scan patient patient had 261 ml of urine in bladder patient is resting with family at bedside and call bell in reach got patient a blanket

## 2020-04-23 NOTE — ED Notes (Signed)
Got patient undress into a gown on the monitor patient is resting with call bell in reach 

## 2020-04-23 NOTE — ED Notes (Signed)
Pt continues to be restless and will not lie down in the bed. Placed mesh underwear with pads on pt and have him sitting in a recliner. Wife is at bedside.

## 2020-04-23 NOTE — ED Notes (Signed)
Unable to insert foley catheter , can not visualized shaft/meatus - patient's penis is retracted , PA notified .

## 2020-04-23 NOTE — ED Notes (Signed)
Patient's bladder irrigated with 3 liters NS , bloody return with blood clots noted , patient tolerated procedure .

## 2020-04-23 NOTE — ED Triage Notes (Signed)
Pt c/o bleeding on urination and sometimes constant from penis since yesterday. States is on blood thinners.

## 2020-04-23 NOTE — Discharge Instructions (Addendum)
Call the urology office in the morning for follow-up in the next two days. Stop the xarelto until evaluated by urology.

## 2020-04-23 NOTE — ED Provider Notes (Signed)
[facility] DEPART[facility] Provider Note   CSN: [id number] Arrival date & time:        History No chief complaint on file.   [name] is a 79 y.o. male.  Patient presents to ED from Urgent Care via EMS. Patient reports bloody urination for 2 days. He is on eliquis for about one month for atrial fibrillation. He denies abdominal pain, back pain, flank pain. No increase in frequency, but reports urgency. No fevers/chills.  The history is provided by the patient and the EMS personnel. No language interpreter was used.  Hematuria This is a new problem. The current episode started 2 days ago. The problem occurs hourly. The problem has not changed since onset.Pertinent negatives include no chest pain, no abdominal pain and no shortness of breath. He has tried nothing for the symptoms.       Past Medical History:  Diagnosis Date  . Benign localized hyperplasia of prostate with urinary obstruction 07/21/2015  . CAP (community acquired pneumonia) 04/14/2015  . Carotid stenosis    left  . CKD (chronic kidney disease) stage 4, GFR 15-29 ml/min (HCC) 04/14/2015  . Complication of anesthesia   . Elevated troponin 04/14/2015  . Essential hypertension 04/14/2015  . Gram-negative bacteremia 04/15/2015  . Headache    migraines  . Hearing loss   . Hematuria 04/19/2015  . HLD (hyperlipidemia)   . Normocytic hypochromic anemia 04/14/2015  . PAF (paroxysmal atrial fibrillation) ([place]) 04/14/2015  . PONV (postoperative nausea and vomiting)   . Sepsis secondary to UTI ([place]) 04/23/2015  . Transfusion history    last 04-24-15  . Urinary retention     Patient Active Problem List   Diagnosis Date Noted  . Syncope 02/28/2020  . Left foot pain 02/28/2020  . Obesity (BMI 30.0-34.9) 02/28/2020  . Carotid stenosis   . HLD (hyperlipidemia)   . Benign prostatic hypertrophy 11/07/2015  . Benign localized hyperplasia of prostate with urinary obstruction 07/21/2015  .  Sepsis secondary to UTI ([place]) 04/23/2015  . Hematuria 04/19/2015  . Gram-negative bacteremia 04/15/2015  . AKI (acute kidney injury) ([place]) 04/15/2015  . Acute renal failure ([place])   . Atrial fibrillation with RVR ([place]) 04/14/2015  . CAP (community acquired pneumonia) 04/14/2015  . Essential hypertension 04/14/2015  . Sepsis ([place]) 04/14/2015  . Elevated troponin 04/14/2015  . Normocytic hypochromic anemia 04/14/2015  . CKD (chronic kidney disease) stage 4, GFR 15-29 ml/min (HCC) 04/14/2015    Past Surgical History:  Procedure Laterality Date  . ESOPHAGOGASTRODUODENOSCOPY N/A 04/23/2015   Procedure: ESOPHAGOGASTRODUODENOSCOPY (EGD);  Surgeon: Carol Ada, MD;  Location: [facility] ENDOSCOPY;  Service: Endoscopy;  Laterality: N/A;  . shots behind eye     right  . TONSILLECTOMY    . TRANSURETHRAL RESECTION OF PROSTATE N/A 07/21/2015   Procedure: TRANSURETHRAL RESECTION OF THE PROSTATE (TURP) WITH CHIPS;  Surgeon: Carolan Clines, MD;  Location: [facility] ORS;  Service: Urology;  Laterality: N/A;  . TRANSURETHRAL RESECTION OF PROSTATE N/A 11/07/2015   Procedure: TRANSURETHRAL RESECTION OF THE PROSTATE (TURP);  Surgeon: Carolan Clines, MD;  Location: [facility] ORS;  Service: Urology;  Laterality: N/A;  . VASECTOMY         Family History  Problem Relation Age of Onset  . Breast cancer Mother     Social History   Tobacco Use  . Smoking status: Never Smoker  . Smokeless tobacco: Never Used  Substance Use Topics  . Alcohol use: No  . Drug use: No  Home Medications Prior to Admission medications   Medication Sig Start Date End Date Taking? Authorizing Provider  Acetaminophen (TYLENOL) 325 MG CAPS Take 325 mg by mouth daily as needed (For Pain).     [provider]  Aspirin-Salicylamide-Caffeine (BC HEADACHE POWDER PO) Take 1 packet by mouth daily as needed (For Headache). Takes as needed     [provider]  colchicine 0.6 MG tablet Take 1 tablet (0.6 mg total) by mouth  daily. 03/03/20   Thurnell Lose, MD  hydrochlorothiazide (HYDRODIURIL) 25 MG tablet Take 25 mg by mouth daily. 01/21/20   [provider]  lisinopril (ZESTRIL) 10 MG tablet Take 1 tablet (10 mg total) by mouth 2 (two) times daily. 03/12/20   Sueanne Margarita, MD  metoprolol succinate (TOPROL XL) 100 MG 24 hr tablet Take 1 tablet (100 mg total) by mouth daily. 03/12/20   Sueanne Margarita, MD  pantoprazole (PROTONIX) 40 MG tablet Take 1 tablet (40 mg total) by mouth daily. 03/03/20   Thurnell Lose, MD  rivaroxaban (XARELTO) 20 MG TABS tablet Take 1 tablet (20 mg total) by mouth daily with supper. 03/12/20   Sueanne Margarita, MD    Allergies    Patient has no known allergies.  Review of Systems   Review of Systems  Constitutional: Negative for fever.  Respiratory: Negative for shortness of breath.   Cardiovascular: Negative for chest pain and leg swelling.  Gastrointestinal: Negative for abdominal pain, nausea and vomiting.  Genitourinary: Positive for discharge and hematuria.  Musculoskeletal: Negative for back pain.  All other systems reviewed and are negative.   Physical Exam Updated Vital Signs There were no vitals taken for this visit.  Physical Exam Constitutional:      Appearance: Normal appearance. He is not diaphoretic.  HENT:     Head: Atraumatic.     Nose: Nose normal.     Mouth/Throat:     Mouth: Mucous membranes are moist.  Eyes:     Conjunctiva/sclera: Conjunctivae normal.  Pulmonary:     Effort: Pulmonary effort is normal.  Abdominal:     Palpations: Abdomen is soft.     Tenderness: There is no abdominal tenderness. There is no right CVA tenderness or left CVA tenderness.  Musculoskeletal:        General: No swelling. Normal range of motion.     Cervical back: Neck supple.  Skin:    General: Skin is warm and dry.  Neurological:     Mental Status: He is alert and oriented to person, place, and time.  Psychiatric:        Mood and Affect: Mood normal.       ED Results / Procedures / Treatments   Labs (all labs ordered are listed, but only abnormal results are displayed) Labs Reviewed  CBC WITH DIFFERENTIAL/PLATELET - Abnormal; Notable for the following components:      Result Value   RBC 3.90 (*)    Hemoglobin 11.2 (*)    HCT 35.6 (*)    All other components within normal limits  PROTIME-INR - Abnormal; Notable for the following components:   Prothrombin Time 26.9 (*)    INR 2.6 (*)    All other components within normal limits  BASIC METABOLIC PANEL - Abnormal; Notable for the following components:   Glucose, Bld 149 (*)    BUN 52 (*)    Creatinine, Ser 2.15 (*)    GFR calc non Af Amer 29 (*)    GFR  calc Af Amer 33 (*)    All other components within normal limits  URINALYSIS, ROUTINE W REFLEX MICROSCOPIC - Abnormal; Notable for the following components:   Color, Urine RED (*)    APPearance TURBID (*)    Glucose, UA   (*)    Value: TEST NOT REPORTED DUE TO COLOR INTERFERENCE OF URINE PIGMENT   Hgb urine dipstick   (*)    Value: TEST NOT REPORTED DUE TO COLOR INTERFERENCE OF URINE PIGMENT   Bilirubin Urine   (*)    Value: TEST NOT REPORTED DUE TO COLOR INTERFERENCE OF URINE PIGMENT   Ketones, ur   (*)    Value: TEST NOT REPORTED DUE TO COLOR INTERFERENCE OF URINE PIGMENT   Protein, ur   (*)    Value: TEST NOT REPORTED DUE TO COLOR INTERFERENCE OF URINE PIGMENT   Nitrite   (*)    Value: TEST NOT REPORTED DUE TO COLOR INTERFERENCE OF URINE PIGMENT   Leukocytes,Ua   (*)    Value: TEST NOT REPORTED DUE TO COLOR INTERFERENCE OF URINE PIGMENT   All other components within normal limits  URINALYSIS, MICROSCOPIC (REFLEX) - Abnormal; Notable for the following components:   Bacteria, UA FEW (*)    Non Squamous Epithelial PRESENT (*)    All other components within normal limits  I-STAT CHEM 8, ED - Abnormal; Notable for the following components:   Potassium 3.1 (*)    BUN 47 (*)    Creatinine, Ser 2.10 (*)    Glucose, Bld 177  (*)    Calcium, Ion 1.14 (*)    Hemoglobin 11.6 (*)    HCT 34.0 (*)    All other components within normal limits  HEPATIC FUNCTION PANEL    EKG EKG Interpretation  Date/Time:  Wednesday April 23 2020 13:56:18 EDT Ventricular Rate:  139 PR Interval:    QRS Duration: 96 QT Interval:  342 QTC Calculation: 521 R Axis:   23 Text Interpretation: Sinus tachycardia with irregular rate Abnormal R-wave progression, early transition Repolarization abnormality, prob rate related Prolonged QT interval Confirmed by Dewaine Conger [id number]) on 04/23/2020 2:31:02 PM   Radiology DG Chest 2 View  Result Date: 04/22/2020 CLINICAL DATA:  Thoracic aortic aneurysm. EXAM: CHEST - 2 VIEW COMPARISON:  None. FINDINGS: There is no evidence of acute infiltrate, pleural effusion or pneumothorax. A large gastric hernia is seen overlying the left lung base. The cardiac silhouette is mildly enlarged. Degenerative changes seen throughout the thoracic spine. IMPRESSION: 1. No acute cardiopulmonary disease. 2. Large gastric hernia. Electronically Signed   By: Virgina Norfolk M.D.   On: 04/22/2020 20:22   NM Pulmonary Perfusion  Result Date: 04/22/2020 CLINICAL DATA:  Pulmonary hypertension. Concern for chronic pulmonary embolism. EXAM: NUCLEAR MEDICINE PERFUSION LUNG SCAN TECHNIQUE: Perfusion images were obtained in multiple projections after intravenous injection of radiopharmaceutical. RADIOPHARMACEUTICALS:  3.9 mCi Tc-43m MAA COMPARISON:  Radiograph 04/22/2020 FINDINGS: No wedge-shaped peripheral perfusion defects within the LEFT or RIGHT lung to suggest acute pulmonary embolism. Poor perfusion pattern to the LEFT lung relates to elevated LEFT hemidiaphragm. IMPRESSION: No evidence acute or chronic pulmonary embolism. Electronically Signed   By: Suzy Bouchard M.D.   On: 04/22/2020 16:32    Procedures Procedures (including critical care time)  Medications Ordered in ED Medications - No data to display  ED Course  I  have reviewed the triage vital signs and the nursing notes.  Pertinent labs & imaging results that were available during my care of the  patient were reviewed by me and considered in my medical decision making (see chart for details).    MDM Rules/Calculators/A&P                          Patient discussed with and seen by Dr. Maryan Rued. Urinary catheter placed and bladder irrigated with 4000 cc normal saline. Urine clearing.   Consult with urology Alinda Money). Recommends leaving catheter in until patient is evaluated in the office the next few days, as well as holding xarelto.  Repeat H/H stable, improving renal function after IV fluids.  Final Clinical Impression(s) / ED Diagnoses Final diagnoses:  Gross hematuria    Rx / DC Orders ED Discharge Orders    None       Etta Quill, NP 04/23/20 2252    Breck Coons, MD 04/24/20 [phone]

## 2020-04-24 ENCOUNTER — Telehealth: Payer: Self-pay | Admitting: Cardiology

## 2020-04-24 DIAGNOSIS — I712 Thoracic aortic aneurysm, without rupture: Secondary | ICD-10-CM | POA: Diagnosis not present

## 2020-04-24 DIAGNOSIS — I272 Pulmonary hypertension, unspecified: Secondary | ICD-10-CM | POA: Diagnosis not present

## 2020-04-24 NOTE — Telephone Encounter (Signed)
Returned call to [place], pt's wife.  Got verbal permission from pt to speak with her.  She has been made aware that pt study was negative for PE and that his Chest X-Ray was ok as well, with it only showed a gastric hernia, which they already know about.  She verbalized understanding and was thankful for the return call.

## 2020-04-24 NOTE — Telephone Encounter (Signed)
Patient's wife, Inez [place] is returning a call in regards to imaging results. Please call.

## 2020-04-28 DIAGNOSIS — R3915 Urgency of urination: Secondary | ICD-10-CM | POA: Diagnosis not present

## 2020-04-28 DIAGNOSIS — R310 Gross hematuria: Secondary | ICD-10-CM | POA: Diagnosis not present

## 2020-04-30 DIAGNOSIS — I480 Paroxysmal atrial fibrillation: Secondary | ICD-10-CM | POA: Diagnosis not present

## 2020-04-30 DIAGNOSIS — H9190 Unspecified hearing loss, unspecified ear: Secondary | ICD-10-CM | POA: Diagnosis not present

## 2020-04-30 DIAGNOSIS — I6522 Occlusion and stenosis of left carotid artery: Secondary | ICD-10-CM | POA: Diagnosis not present

## 2020-04-30 DIAGNOSIS — E785 Hyperlipidemia, unspecified: Secondary | ICD-10-CM | POA: Diagnosis not present

## 2020-04-30 DIAGNOSIS — I129 Hypertensive chronic kidney disease with stage 1 through stage 4 chronic kidney disease, or unspecified chronic kidney disease: Secondary | ICD-10-CM | POA: Diagnosis not present

## 2020-04-30 DIAGNOSIS — I2510 Atherosclerotic heart disease of native coronary artery without angina pectoris: Secondary | ICD-10-CM | POA: Diagnosis not present

## 2020-04-30 DIAGNOSIS — M109 Gout, unspecified: Secondary | ICD-10-CM | POA: Diagnosis not present

## 2020-04-30 DIAGNOSIS — Z9981 Dependence on supplemental oxygen: Secondary | ICD-10-CM | POA: Diagnosis not present

## 2020-04-30 DIAGNOSIS — N184 Chronic kidney disease, stage 4 (severe): Secondary | ICD-10-CM | POA: Diagnosis not present

## 2020-04-30 DIAGNOSIS — Z7901 Long term (current) use of anticoagulants: Secondary | ICD-10-CM | POA: Diagnosis not present

## 2020-04-30 DIAGNOSIS — I272 Pulmonary hypertension, unspecified: Secondary | ICD-10-CM | POA: Diagnosis not present

## 2020-04-30 DIAGNOSIS — N138 Other obstructive and reflux uropathy: Secondary | ICD-10-CM | POA: Diagnosis not present

## 2020-04-30 DIAGNOSIS — D509 Iron deficiency anemia, unspecified: Secondary | ICD-10-CM | POA: Diagnosis not present

## 2020-05-01 DIAGNOSIS — M109 Gout, unspecified: Secondary | ICD-10-CM | POA: Diagnosis not present

## 2020-05-01 DIAGNOSIS — N138 Other obstructive and reflux uropathy: Secondary | ICD-10-CM | POA: Diagnosis not present

## 2020-05-01 DIAGNOSIS — Z9981 Dependence on supplemental oxygen: Secondary | ICD-10-CM | POA: Diagnosis not present

## 2020-05-01 DIAGNOSIS — I2510 Atherosclerotic heart disease of native coronary artery without angina pectoris: Secondary | ICD-10-CM | POA: Diagnosis not present

## 2020-05-01 DIAGNOSIS — H9190 Unspecified hearing loss, unspecified ear: Secondary | ICD-10-CM | POA: Diagnosis not present

## 2020-05-01 DIAGNOSIS — I6522 Occlusion and stenosis of left carotid artery: Secondary | ICD-10-CM | POA: Diagnosis not present

## 2020-05-01 DIAGNOSIS — I129 Hypertensive chronic kidney disease with stage 1 through stage 4 chronic kidney disease, or unspecified chronic kidney disease: Secondary | ICD-10-CM | POA: Diagnosis not present

## 2020-05-01 DIAGNOSIS — I272 Pulmonary hypertension, unspecified: Secondary | ICD-10-CM | POA: Diagnosis not present

## 2020-05-01 DIAGNOSIS — E785 Hyperlipidemia, unspecified: Secondary | ICD-10-CM | POA: Diagnosis not present

## 2020-05-01 DIAGNOSIS — I480 Paroxysmal atrial fibrillation: Secondary | ICD-10-CM | POA: Diagnosis not present

## 2020-05-01 DIAGNOSIS — N184 Chronic kidney disease, stage 4 (severe): Secondary | ICD-10-CM | POA: Diagnosis not present

## 2020-05-01 DIAGNOSIS — Z7901 Long term (current) use of anticoagulants: Secondary | ICD-10-CM | POA: Diagnosis not present

## 2020-05-01 DIAGNOSIS — D509 Iron deficiency anemia, unspecified: Secondary | ICD-10-CM | POA: Diagnosis not present

## 2020-05-02 DIAGNOSIS — R0902 Hypoxemia: Secondary | ICD-10-CM | POA: Diagnosis not present
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
## 2020-05-13 ENCOUNTER — Encounter: Payer: Self-pay | Admitting: Cardiology

## 2020-05-13 ENCOUNTER — Other Ambulatory Visit: Payer: Self-pay

## 2020-05-13 ENCOUNTER — Ambulatory Visit: Payer: Medicare Other | Admitting: Cardiology

## 2020-05-13 VITALS — BP 128/65 | HR 79 | Ht 65.0 in | Wt 203.0 lb

## 2020-05-13 DIAGNOSIS — I10 Essential (primary) hypertension: Secondary | ICD-10-CM | POA: Diagnosis not present

## 2020-05-13 DIAGNOSIS — I480 Paroxysmal atrial fibrillation: Secondary | ICD-10-CM

## 2020-05-13 DIAGNOSIS — I6523 Occlusion and stenosis of bilateral carotid arteries: Secondary | ICD-10-CM | POA: Diagnosis not present

## 2020-05-13 DIAGNOSIS — E7800 Pure hypercholesterolemia, unspecified: Secondary | ICD-10-CM

## 2020-05-13 MED ORDER — RIVAROXABAN 15 MG PO TABS
15.0000 mg | ORAL_TABLET | Freq: Every day | ORAL | 3 refills | Status: DC
Start: 2020-05-13 — End: 2021-02-02

## 2020-05-13 NOTE — Patient Instructions (Signed)
Medication Instructions:  Your physician has recommended you make the following change in your medication:  1) DECREASE Xarelto to 15 mg daily   *If you need a refill on your cardiac medications before your next appointment, please call your pharmacy*  Follow-Up: At [facility], you and your health needs are our priority.  As part of our continuing mission to provide you with exceptional heart care, we have created designated Provider Care Teams.  These Care Teams include your primary Cardiologist (physician) and Advanced Practice Providers (APPs -  Physician Assistants and Nurse Practitioners) who all work together to provide you with the care you need, when you need it.  We recommend signing up for the patient portal called "MyChart".  Sign up information is provided on this After Visit Summary.  MyChart is used to connect with patients for Virtual Visits (Telemedicine).  Patients are able to view lab/test results, encounter notes, upcoming appointments, etc.  Non-urgent messages can be sent to your provider as well.   To learn more about what you can do with MyChart, go to [email or web address].    Your next appointment:   6 month(s)  The format for your next appointment:   In Person  Provider:   You may see [name], MD or one of the following Advanced Practice Providers on your designated Care Team:    Melina Copa, PA-C  Ermalinda Barrios, PA-C  Other Instructions You have been referred back to [facility]

## 2020-05-13 NOTE — Addendum Note (Signed)
Addended by: Antonieta Iba on: 05/13/2020 09:44 AM   Modules accepted: Orders

## 2020-05-14 DIAGNOSIS — N200 Calculus of kidney: Secondary | ICD-10-CM | POA: Diagnosis not present

## 2020-05-14 DIAGNOSIS — K5730 Diverticulosis of large intestine without perforation or abscess without bleeding: Secondary | ICD-10-CM | POA: Diagnosis not present

## 2020-05-14 DIAGNOSIS — I700 Atherosclerosis of aorta: Secondary | ICD-10-CM | POA: Diagnosis not present

## 2020-05-14 DIAGNOSIS — R310 Gross hematuria: Secondary | ICD-10-CM | POA: Diagnosis not present

## 2020-05-14 DIAGNOSIS — D1779 Benign lipomatous neoplasm of other sites: Secondary | ICD-10-CM | POA: Diagnosis not present

## 2020-05-23 DIAGNOSIS — N3000 Acute cystitis without hematuria: Secondary | ICD-10-CM | POA: Diagnosis not present

## 2020-05-23 DIAGNOSIS — R310 Gross hematuria: Secondary | ICD-10-CM | POA: Diagnosis not present

## 2020-05-28 ENCOUNTER — Ambulatory Visit (HOSPITAL_COMMUNITY)
Admission: RE | Admit: 2020-05-28 | Discharge: 2020-05-28 | Disposition: A | Discharge status: Home / Self Care | Payer: Medicare Other | Source: Ambulatory Visit | Attending: Nurse Practitioner | Admitting: Nurse Practitioner

## 2020-05-28 ENCOUNTER — Other Ambulatory Visit: Payer: Self-pay

## 2020-05-28 ENCOUNTER — Encounter (HOSPITAL_COMMUNITY): Payer: Self-pay | Admitting: Nurse Practitioner

## 2020-05-28 VITALS — BP 120/82 | HR 89 | Ht 65.0 in | Wt 206.0 lb

## 2020-05-28 DIAGNOSIS — I4819 Other persistent atrial fibrillation: Secondary | ICD-10-CM | POA: Diagnosis not present

## 2020-05-28 DIAGNOSIS — N189 Chronic kidney disease, unspecified: Secondary | ICD-10-CM | POA: Diagnosis not present

## 2020-05-28 DIAGNOSIS — I4811 Longstanding persistent atrial fibrillation: Secondary | ICD-10-CM

## 2020-05-28 DIAGNOSIS — N184 Chronic kidney disease, stage 4 (severe): Secondary | ICD-10-CM | POA: Insufficient documentation

## 2020-05-28 DIAGNOSIS — Z79899 Other long term (current) drug therapy: Secondary | ICD-10-CM | POA: Insufficient documentation

## 2020-05-28 DIAGNOSIS — E785 Hyperlipidemia, unspecified: Secondary | ICD-10-CM | POA: Insufficient documentation

## 2020-05-28 DIAGNOSIS — Z7901 Long term (current) use of anticoagulants: Secondary | ICD-10-CM | POA: Insufficient documentation

## 2020-05-28 DIAGNOSIS — I351 Nonrheumatic aortic (valve) insufficiency: Secondary | ICD-10-CM | POA: Diagnosis not present

## 2020-05-28 DIAGNOSIS — Z7982 Long term (current) use of aspirin: Secondary | ICD-10-CM | POA: Insufficient documentation

## 2020-05-28 DIAGNOSIS — I129 Hypertensive chronic kidney disease with stage 1 through stage 4 chronic kidney disease, or unspecified chronic kidney disease: Secondary | ICD-10-CM | POA: Insufficient documentation

## 2020-05-28 DIAGNOSIS — D6869 Other thrombophilia: Secondary | ICD-10-CM | POA: Diagnosis not present

## 2020-05-28 DIAGNOSIS — E669 Obesity, unspecified: Secondary | ICD-10-CM | POA: Diagnosis not present
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
## 2020-05-29 ENCOUNTER — Encounter (HOSPITAL_BASED_OUTPATIENT_CLINIC_OR_DEPARTMENT_OTHER): Payer: Medicare Other | Admitting: Cardiology

## 2020-06-02 DIAGNOSIS — R3915 Urgency of urination: Secondary | ICD-10-CM | POA: Diagnosis not present

## 2020-06-02 DIAGNOSIS — R310 Gross hematuria: Secondary | ICD-10-CM | POA: Diagnosis not present

## 2020-06-10 DIAGNOSIS — I10 Essential (primary) hypertension: Secondary | ICD-10-CM | POA: Diagnosis not present

## 2020-06-10 DIAGNOSIS — E119 Type 2 diabetes mellitus without complications: Secondary | ICD-10-CM | POA: Diagnosis not present

## 2020-06-10 DIAGNOSIS — E785 Hyperlipidemia, unspecified: Secondary | ICD-10-CM | POA: Diagnosis not present

## 2020-06-10 DIAGNOSIS — Z0000 Encounter for general adult medical examination without abnormal findings: Secondary | ICD-10-CM | POA: Diagnosis not present

## 2020-06-10 DIAGNOSIS — M1A9XX0 Chronic gout, unspecified, without tophus (tophi): Secondary | ICD-10-CM | POA: Diagnosis not present

## 2020-06-10 DIAGNOSIS — N390 Urinary tract infection, site not specified: Secondary | ICD-10-CM | POA: Diagnosis not present

## 2020-06-17 DIAGNOSIS — Z0000 Encounter for general adult medical examination without abnormal findings: Secondary | ICD-10-CM | POA: Diagnosis not present

## 2020-06-17 DIAGNOSIS — R319 Hematuria, unspecified: Secondary | ICD-10-CM | POA: Diagnosis not present

## 2020-09-01 DIAGNOSIS — R310 Gross hematuria: Secondary | ICD-10-CM | POA: Diagnosis not present

## 2020-10-23 DIAGNOSIS — N183 Chronic kidney disease, stage 3 unspecified: Secondary | ICD-10-CM | POA: Diagnosis not present

## 2020-10-23 DIAGNOSIS — D631 Anemia in chronic kidney disease: Secondary | ICD-10-CM | POA: Diagnosis not present

## 2020-10-23 DIAGNOSIS — N182 Chronic kidney disease, stage 2 (mild): Secondary | ICD-10-CM | POA: Diagnosis not present

## 2020-10-23 DIAGNOSIS — Z7689 Persons encountering health services in other specified circumstances: Secondary | ICD-10-CM | POA: Diagnosis not present

## 2020-10-29 DIAGNOSIS — N2581 Secondary hyperparathyroidism of renal origin: Secondary | ICD-10-CM | POA: Diagnosis not present

## 2020-10-29 DIAGNOSIS — N320 Bladder-neck obstruction: Secondary | ICD-10-CM | POA: Diagnosis not present

## 2020-10-29 DIAGNOSIS — I129 Hypertensive chronic kidney disease with stage 1 through stage 4 chronic kidney disease, or unspecified chronic kidney disease: Secondary | ICD-10-CM | POA: Diagnosis not present

## 2020-10-29 DIAGNOSIS — N183 Chronic kidney disease, stage 3 unspecified: Secondary | ICD-10-CM | POA: Diagnosis not present

## 2020-10-29 DIAGNOSIS — D509 Iron deficiency anemia, unspecified: Secondary | ICD-10-CM | POA: Diagnosis not present

## 2020-10-30 IMAGING — DX DG CHEST 2V
2 series · 2 of 2 positions shown · non-contrast
Comparison: None.

CLINICAL DATA: Thoracic aortic aneurysm.

EXAM:
CHEST - 2 VIEW

[chest pa]
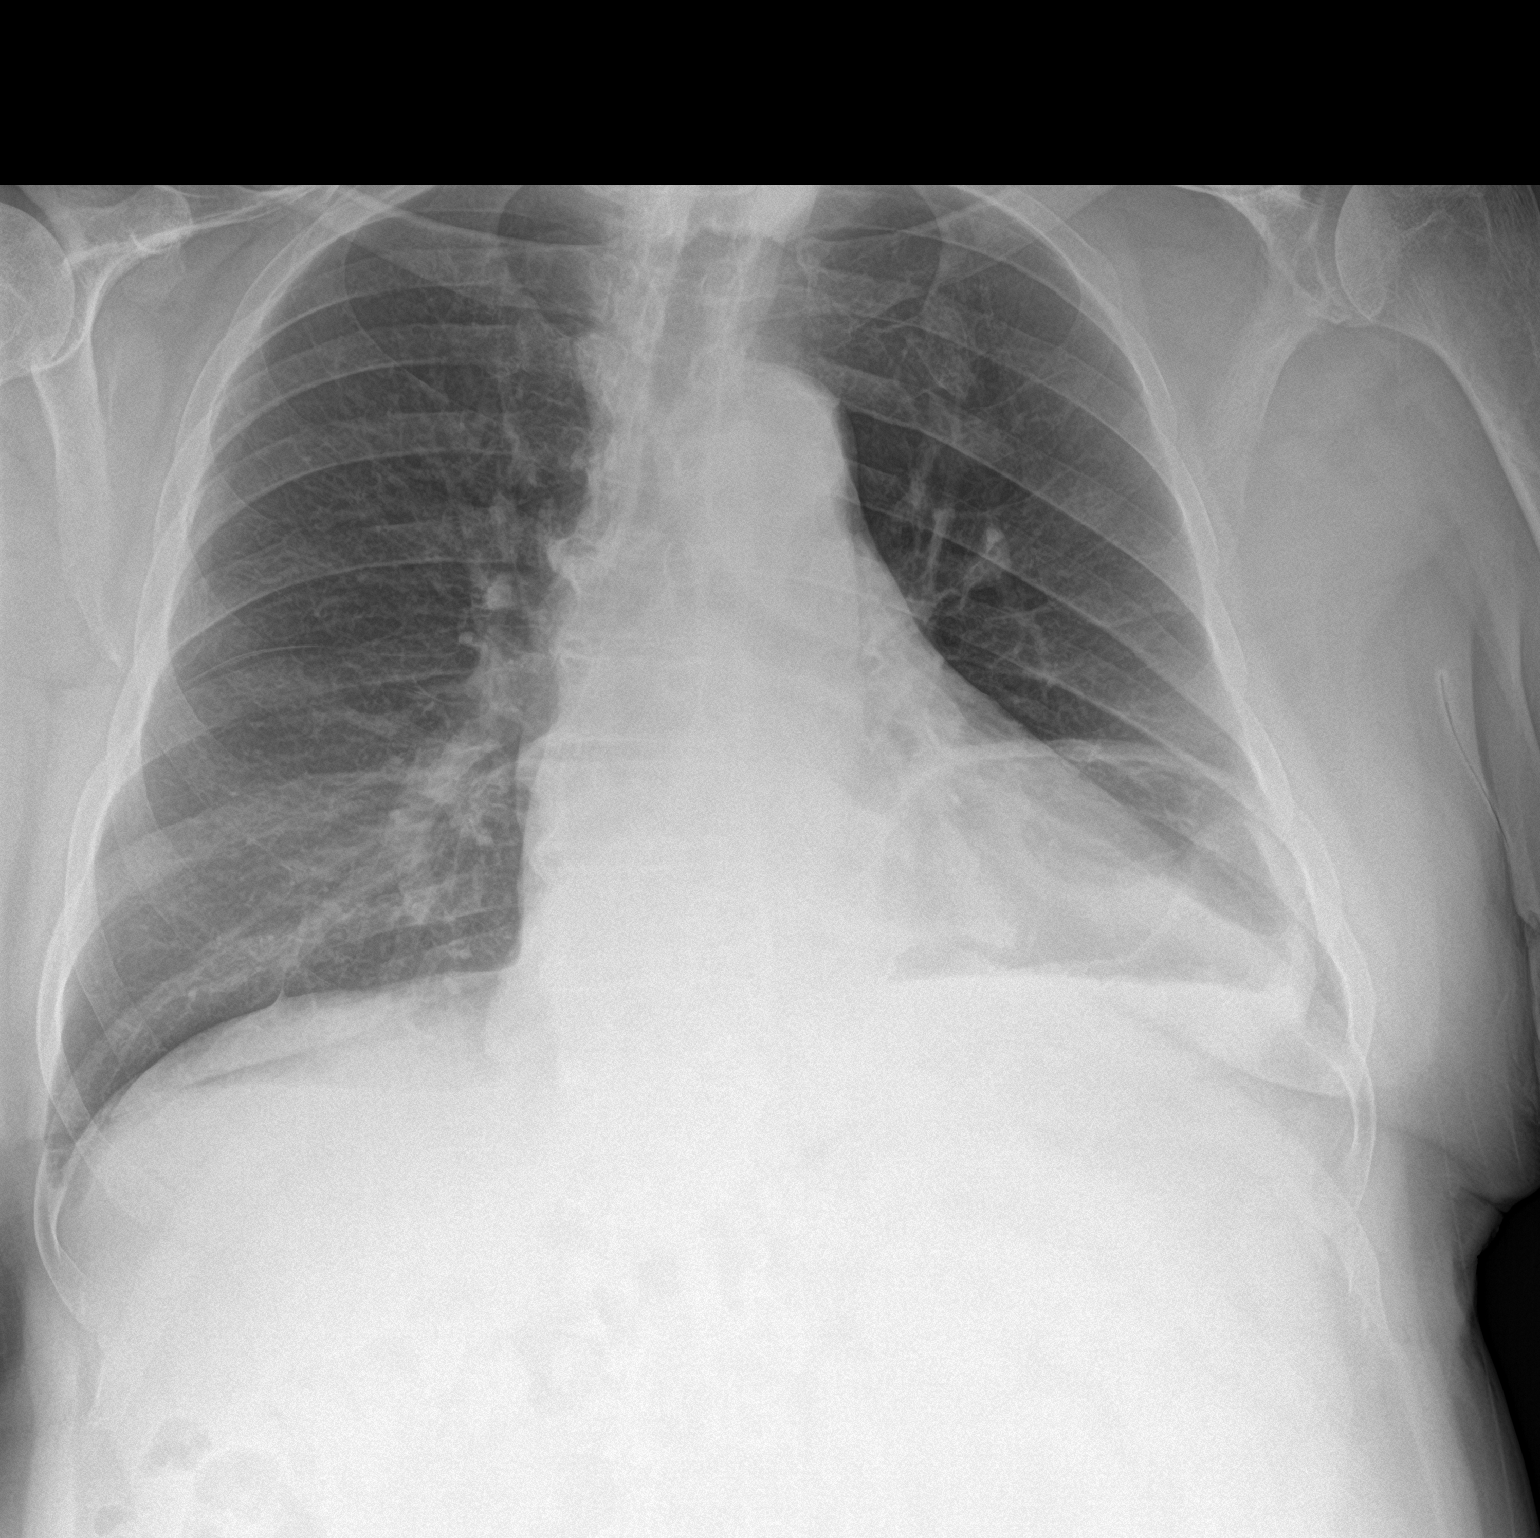

[chest lat]
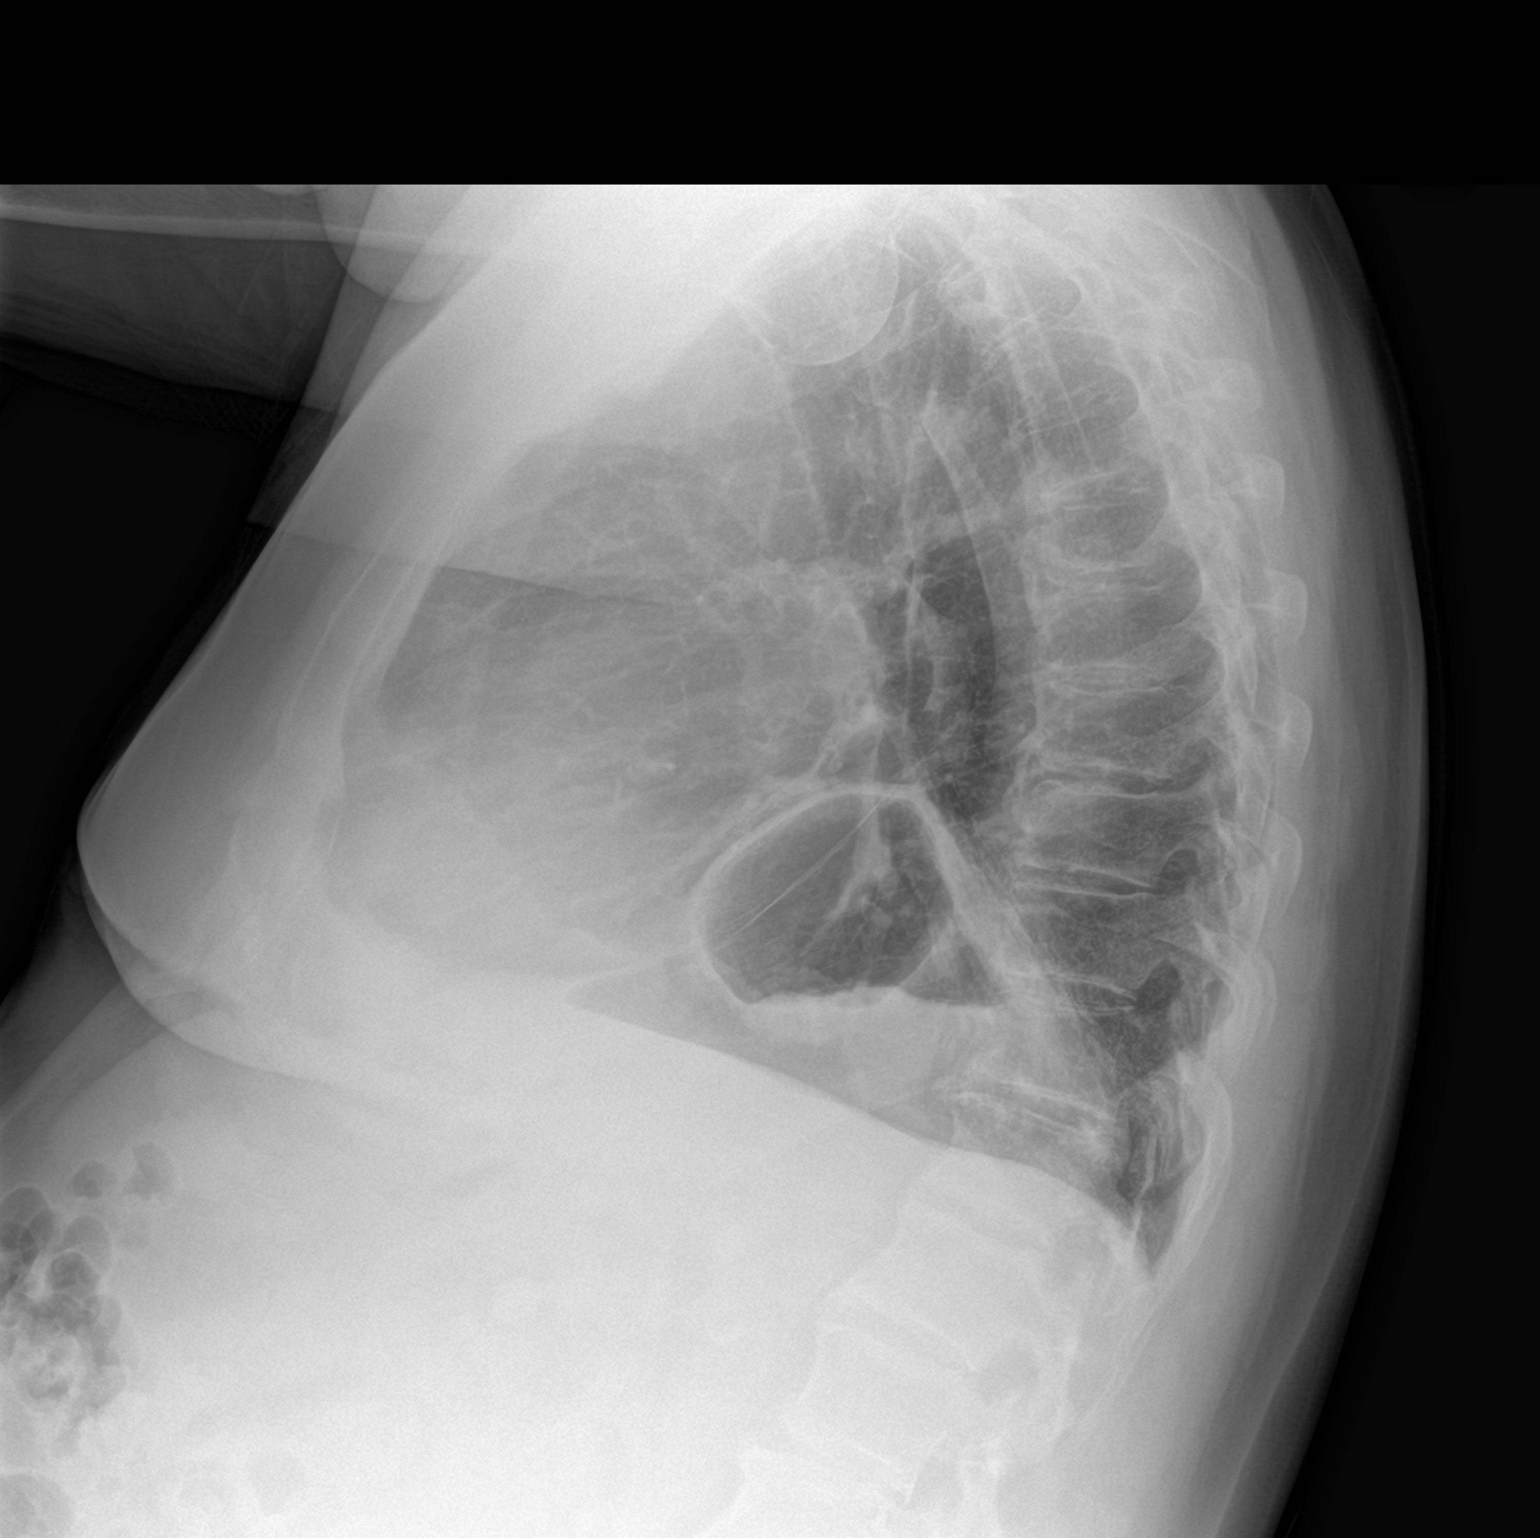

[2 of 2 positions shown; findings below may reference images not displayed]

FINDINGS: There is no evidence of acute infiltrate, pleural effusion or
pneumothorax. A large gastric hernia is seen overlying the left lung
base. The cardiac silhouette is mildly enlarged. Degenerative
changes seen throughout the thoracic spine.
IMPRESSION: 1. No acute cardiopulmonary disease.
2. Large gastric hernia.

## 2020-12-10 DIAGNOSIS — R946 Abnormal results of thyroid function studies: Secondary | ICD-10-CM | POA: Diagnosis not present

## 2020-12-10 DIAGNOSIS — E785 Hyperlipidemia, unspecified: Secondary | ICD-10-CM | POA: Diagnosis not present

## 2020-12-10 DIAGNOSIS — E119 Type 2 diabetes mellitus without complications: Secondary | ICD-10-CM | POA: Diagnosis not present

## 2020-12-10 DIAGNOSIS — I10 Essential (primary) hypertension: Secondary | ICD-10-CM | POA: Diagnosis not present

## 2020-12-15 DIAGNOSIS — N183 Chronic kidney disease, stage 3 unspecified: Secondary | ICD-10-CM | POA: Diagnosis not present

## 2020-12-15 DIAGNOSIS — D509 Iron deficiency anemia, unspecified: Secondary | ICD-10-CM | POA: Diagnosis not present

## 2020-12-15 DIAGNOSIS — I10 Essential (primary) hypertension: Secondary | ICD-10-CM | POA: Diagnosis not present

## 2020-12-15 DIAGNOSIS — E119 Type 2 diabetes mellitus without complications: Secondary | ICD-10-CM | POA: Diagnosis not present

## 2020-12-15 DIAGNOSIS — E782 Mixed hyperlipidemia: Secondary | ICD-10-CM | POA: Diagnosis not present

## 2020-12-15 DIAGNOSIS — K219 Gastro-esophageal reflux disease without esophagitis: Secondary | ICD-10-CM | POA: Diagnosis not present

## 2020-12-15 DIAGNOSIS — I4891 Unspecified atrial fibrillation: Secondary | ICD-10-CM | POA: Diagnosis not present

## 2021-02-02 ENCOUNTER — Ambulatory Visit: Payer: Medicare Other | Admitting: Cardiology

## 2021-02-02 ENCOUNTER — Encounter: Payer: Self-pay | Admitting: Cardiology

## 2021-02-02 ENCOUNTER — Other Ambulatory Visit: Payer: Self-pay

## 2021-02-02 VITALS — BP 116/70 | HR 61 | Ht 66.0 in | Wt 206.2 lb

## 2021-02-02 DIAGNOSIS — I10 Essential (primary) hypertension: Secondary | ICD-10-CM | POA: Diagnosis not present

## 2021-02-02 DIAGNOSIS — I6523 Occlusion and stenosis of bilateral carotid arteries: Secondary | ICD-10-CM | POA: Diagnosis not present

## 2021-02-02 DIAGNOSIS — E7800 Pure hypercholesterolemia, unspecified: Secondary | ICD-10-CM | POA: Diagnosis not present

## 2021-02-02 DIAGNOSIS — I480 Paroxysmal atrial fibrillation: Secondary | ICD-10-CM | POA: Diagnosis not present

## 2021-02-02 MED ORDER — HYDROCHLOROTHIAZIDE 25 MG PO TABS: 25.0000 mg | ORAL_TABLET | Freq: Every day | ORAL | 3 refills | Status: DC

## 2021-02-02 MED ORDER — METOPROLOL SUCCINATE ER 100 MG PO TB24: 100.0000 mg | ORAL_TABLET | Freq: Every day | ORAL | 3 refills | Status: DC

## 2021-02-02 MED ORDER — RIVAROXABAN 20 MG PO TABS: 20.0000 mg | ORAL_TABLET | Freq: Every day | ORAL | 3 refills | Status: DC

## 2021-02-02 NOTE — Patient Instructions (Signed)
Medication Instructions:  Your physician recommends that you continue on your current medications as directed. Please refer to the Current Medication list given to you today.  *If you need a refill on your cardiac medications before your next appointment, please call your pharmacy*   Lab Work: TODAY: BMET and magnesium  If you have labs (blood work) drawn today and your tests are completely normal, you will receive your results only by: Marland Kitchen MyChart Message (if you have MyChart) OR . A paper copy in the mail If you have any lab test that is abnormal or we need to change your treatment, we will call you to review the results.  Follow-Up: At [facility], you and your health needs are our priority.  As part of our continuing mission to provide you with exceptional heart care, we have created designated Provider Care Teams.  These Care Teams include your primary Cardiologist (physician) and Advanced Practice Providers (APPs -  Physician Assistants and Nurse Practitioners) who all work together to provide you with the care you need, when you need it.  Your next appointment:   1 year(s)  The format for your next appointment:   In Person  Provider:   You may see [name], MD or one of the following Advanced Practice Providers on your designated Care Team:    Melina Copa, PA-C  Ermalinda Barrios, PA-C

## 2021-02-02 NOTE — Addendum Note (Signed)
Addended by: Antonieta Iba on: 02/02/2021 03:05 PM   Modules accepted: Orders
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
## 2021-02-03 ENCOUNTER — Telehealth: Payer: Self-pay | Admitting: Cardiology

## 2021-02-03 DIAGNOSIS — I10 Essential (primary) hypertension: Secondary | ICD-10-CM

## 2021-02-03 LAB — BASIC METABOLIC PANEL
BUN/Creatinine Ratio: 16 (ref 10–24)
BUN: 26 mg/dL (ref 8–27)
CO2: 24 mmol/L (ref 20–29)
Calcium: 9 mg/dL (ref 8.6–10.2)
Chloride: 101 mmol/L (ref 96–106)
Creatinine, Ser: 1.61 mg/dL — ABNORMAL HIGH (ref 0.76–1.27)
Glucose: 96 mg/dL (ref 65–99)
Potassium: 4 mmol/L (ref 3.5–5.2)
Sodium: 141 mmol/L (ref 134–144)
eGFR: 44 mL/min/{1.73_m2} — ABNORMAL LOW (ref >59)

## 2021-02-03 LAB — MAGNESIUM: Magnesium: 1.9 mg/dL (ref 1.6–2.3)

## 2021-02-03 NOTE — Telephone Encounter (Signed)
Sueanne Margarita, MD  02/03/2021 8:18 AM EDT      Renal function still elevated - stop HCTZ and repeat BMET in 1 week - have him check his BP daily at lunch for a week and call with results   The patient has been notified of the result and verbalized understanding.  All questions (if any) were answered. Antonieta Iba, RN 02/03/2021 9:54 AM  Repeat labs have been scheduled.

## 2021-02-03 NOTE — Telephone Encounter (Signed)
Follow Up:   Wife is calling back, she said she had [facility] earlier[facility] and need to talk to her again please.

## 2021-02-11 ENCOUNTER — Other Ambulatory Visit: Payer: Self-pay

## 2021-02-11 ENCOUNTER — Other Ambulatory Visit: Payer: Medicare Other | Admitting: *Deleted

## 2021-02-11 DIAGNOSIS — I10 Essential (primary) hypertension: Secondary | ICD-10-CM

## 2021-02-12 LAB — BASIC METABOLIC PANEL
BUN/Creatinine Ratio: 17 (ref 10–24)
BUN: 26 mg/dL (ref 8–27)
CO2: 25 mmol/L (ref 20–29)
Calcium: 8.8 mg/dL (ref 8.6–10.2)
Chloride: 103 mmol/L (ref 96–106)
Creatinine, Ser: 1.56 mg/dL — ABNORMAL HIGH (ref 0.76–1.27)
Glucose: 92 mg/dL (ref 65–99)
Potassium: 4.5 mmol/L (ref 3.5–5.2)
Sodium: 143 mmol/L (ref 134–144)
eGFR: 45 mL/min/{1.73_m2} — ABNORMAL LOW (ref >59)

## 2021-02-18 ENCOUNTER — Telehealth: Payer: Self-pay

## 2021-02-18 MED ORDER — RIVAROXABAN 15 MG PO TABS: 15.0000 mg | ORAL_TABLET | Freq: Every day | ORAL | 3 refills | Status: DC

## 2021-02-18 NOTE — Telephone Encounter (Signed)
Spoke with the patient's wife who is in the office today. She reports that at the patient's last office visit it was mistakenly reported that the patient was taking xarelto 20 mg daily. She states that the patient has actually been taking xarelto 15 mg daily.

## 2021-02-24 DIAGNOSIS — R0602 Shortness of breath: Secondary | ICD-10-CM | POA: Diagnosis not present

## 2021-02-24 DIAGNOSIS — J90 Pleural effusion, not elsewhere classified: Secondary | ICD-10-CM | POA: Diagnosis not present

## 2021-02-24 DIAGNOSIS — I10 Essential (primary) hypertension: Secondary | ICD-10-CM | POA: Diagnosis not present

## 2021-02-24 DIAGNOSIS — R600 Localized edema: Secondary | ICD-10-CM | POA: Diagnosis not present

## 2021-02-24 DIAGNOSIS — R062 Wheezing: Secondary | ICD-10-CM | POA: Diagnosis not present

## 2021-02-26 ENCOUNTER — Inpatient Hospital Stay (HOSPITAL_COMMUNITY)
Admission: EM | Admit: 2021-02-26 | Discharge: 2021-02-28 | DRG: 291 | Disposition: A | Discharge status: Home / Self Care | Payer: Medicare Other | Attending: Internal Medicine | Admitting: Internal Medicine

## 2021-02-26 ENCOUNTER — Other Ambulatory Visit: Payer: Self-pay

## 2021-02-26 ENCOUNTER — Emergency Department (HOSPITAL_COMMUNITY): Payer: Medicare Other

## 2021-02-26 ENCOUNTER — Encounter (HOSPITAL_COMMUNITY): Payer: Self-pay

## 2021-02-26 DIAGNOSIS — I77810 Thoracic aortic ectasia: Secondary | ICD-10-CM | POA: Diagnosis not present

## 2021-02-26 DIAGNOSIS — R0902 Hypoxemia: Secondary | ICD-10-CM | POA: Diagnosis not present

## 2021-02-26 DIAGNOSIS — I5023 Acute on chronic systolic (congestive) heart failure: Secondary | ICD-10-CM | POA: Diagnosis not present

## 2021-02-26 DIAGNOSIS — L03115 Cellulitis of right lower limb: Secondary | ICD-10-CM | POA: Diagnosis present

## 2021-02-26 DIAGNOSIS — Z66 Do not resuscitate: Secondary | ICD-10-CM | POA: Diagnosis not present

## 2021-02-26 DIAGNOSIS — I509 Heart failure, unspecified: Secondary | ICD-10-CM

## 2021-02-26 DIAGNOSIS — Z7901 Long term (current) use of anticoagulants: Secondary | ICD-10-CM

## 2021-02-26 DIAGNOSIS — E876 Hypokalemia: Secondary | ICD-10-CM | POA: Diagnosis not present

## 2021-02-26 DIAGNOSIS — D631 Anemia in chronic kidney disease: Secondary | ICD-10-CM | POA: Diagnosis present

## 2021-02-26 DIAGNOSIS — I4891 Unspecified atrial fibrillation: Secondary | ICD-10-CM

## 2021-02-26 DIAGNOSIS — N184 Chronic kidney disease, stage 4 (severe): Secondary | ICD-10-CM | POA: Diagnosis present

## 2021-02-26 DIAGNOSIS — I5033 Acute on chronic diastolic (congestive) heart failure: Secondary | ICD-10-CM | POA: Diagnosis present

## 2021-02-26 DIAGNOSIS — D649 Anemia, unspecified: Secondary | ICD-10-CM | POA: Diagnosis not present

## 2021-02-26 DIAGNOSIS — Z20822 Contact with and (suspected) exposure to covid-19: Secondary | ICD-10-CM | POA: Diagnosis not present

## 2021-02-26 DIAGNOSIS — I10 Essential (primary) hypertension: Secondary | ICD-10-CM | POA: Diagnosis not present

## 2021-02-26 DIAGNOSIS — I5031 Acute diastolic (congestive) heart failure: Secondary | ICD-10-CM | POA: Diagnosis not present

## 2021-02-26 DIAGNOSIS — L03116 Cellulitis of left lower limb: Secondary | ICD-10-CM | POA: Diagnosis not present

## 2021-02-26 DIAGNOSIS — N401 Enlarged prostate with lower urinary tract symptoms: Secondary | ICD-10-CM | POA: Diagnosis present

## 2021-02-26 DIAGNOSIS — I130 Hypertensive heart and chronic kidney disease with heart failure and stage 1 through stage 4 chronic kidney disease, or unspecified chronic kidney disease: Principal | ICD-10-CM | POA: Diagnosis present

## 2021-02-26 DIAGNOSIS — I110 Hypertensive heart disease with heart failure: Secondary | ICD-10-CM | POA: Diagnosis not present

## 2021-02-26 DIAGNOSIS — I4821 Permanent atrial fibrillation: Secondary | ICD-10-CM | POA: Diagnosis present

## 2021-02-26 DIAGNOSIS — K449 Diaphragmatic hernia without obstruction or gangrene: Secondary | ICD-10-CM | POA: Diagnosis not present

## 2021-02-26 DIAGNOSIS — D509 Iron deficiency anemia, unspecified: Secondary | ICD-10-CM | POA: Diagnosis present

## 2021-02-26 DIAGNOSIS — H9190 Unspecified hearing loss, unspecified ear: Secondary | ICD-10-CM | POA: Diagnosis present

## 2021-02-26 DIAGNOSIS — R0602 Shortness of breath: Secondary | ICD-10-CM | POA: Diagnosis not present

## 2021-02-26 DIAGNOSIS — E785 Hyperlipidemia, unspecified: Secondary | ICD-10-CM | POA: Diagnosis present

## 2021-02-26 DIAGNOSIS — K219 Gastro-esophageal reflux disease without esophagitis: Secondary | ICD-10-CM | POA: Diagnosis not present

## 2021-02-26 DIAGNOSIS — Z87438 Personal history of other diseases of male genital organs: Secondary | ICD-10-CM | POA: Diagnosis not present

## 2021-02-26 DIAGNOSIS — Z79899 Other long term (current) drug therapy: Secondary | ICD-10-CM

## 2021-02-26 LAB — CBC WITH DIFFERENTIAL/PLATELET
Abs Immature Granulocytes: 0.04 10*3/uL (ref 0.00–0.07)
Basophils Absolute: 0 10*3/uL (ref 0.0–0.1)
Basophils Relative: 0 %
Eosinophils Absolute: 0.1 10*3/uL (ref 0.0–0.5)
Eosinophils Relative: 1 %
HCT: 31.9 % — ABNORMAL LOW (ref 39.0–52.0)
Hemoglobin: 9.5 g/dL — ABNORMAL LOW (ref 13.0–17.0)
Immature Granulocytes: 1 %
Lymphocytes Relative: 10 %
Lymphs Abs: 0.7 10*3/uL (ref 0.7–4.0)
MCH: 25.3 pg — ABNORMAL LOW (ref 26.0–34.0)
MCHC: 29.8 g/dL — ABNORMAL LOW (ref 30.0–36.0)
MCV: 84.8 fL (ref 80.0–100.0)
Monocytes Absolute: 0.6 10*3/uL (ref 0.1–1.0)
Monocytes Relative: 8 %
Neutro Abs: 5.3 10*3/uL (ref 1.7–7.7)
Neutrophils Relative %: 80 %
Platelets: 251 10*3/uL (ref 150–400)
RBC: 3.76 MIL/uL — ABNORMAL LOW (ref 4.22–5.81)
RDW: 19.4 % — ABNORMAL HIGH (ref 11.5–15.5)
WBC: 6.7 10*3/uL (ref 4.0–10.5)
nRBC: 0 % (ref 0.0–0.2)

## 2021-02-26 LAB — RESP PANEL BY RT-PCR (FLU A&B, COVID) ARPGX2
Influenza A by PCR: NEGATIVE
Influenza B by PCR: NEGATIVE
SARS Coronavirus 2 by RT PCR: NEGATIVE

## 2021-02-26 LAB — COMPREHENSIVE METABOLIC PANEL
ALT: 54 U/L — ABNORMAL HIGH (ref 0–44)
AST: 39 U/L (ref 15–41)
Albumin: 3.4 g/dL — ABNORMAL LOW (ref 3.5–5.0)
Alkaline Phosphatase: 79 U/L (ref 38–126)
Anion gap: 10 (ref 5–15)
BUN: 32 mg/dL — ABNORMAL HIGH (ref 8–23)
CO2: 26 mmol/L (ref 22–32)
Calcium: 8.8 mg/dL — ABNORMAL LOW (ref 8.9–10.3)
Chloride: 103 mmol/L (ref 98–111)
Creatinine, Ser: 1.76 mg/dL — ABNORMAL HIGH (ref 0.61–1.24)
GFR, Estimated: 39 mL/min — ABNORMAL LOW (ref >60)
Glucose, Bld: 118 mg/dL — ABNORMAL HIGH (ref 70–99)
Potassium: 3.4 mmol/L — ABNORMAL LOW (ref 3.5–5.1)
Sodium: 139 mmol/L (ref 135–145)
Total Bilirubin: 1.1 mg/dL (ref 0.3–1.2)
Total Protein: 6.3 g/dL — ABNORMAL LOW (ref 6.5–8.1)

## 2021-02-26 LAB — TROPONIN I (HIGH SENSITIVITY)
Troponin I (High Sensitivity): 22 ng/L — ABNORMAL HIGH (ref <18)
Troponin I (High Sensitivity): 19 ng/L — ABNORMAL HIGH (ref <18)

## 2021-02-26 LAB — BRAIN NATRIURETIC PEPTIDE: B Natriuretic Peptide: 1120.7 pg/mL — ABNORMAL HIGH (ref 0.0–100.0)

## 2021-02-26 MED ORDER — NITROGLYCERIN 2 % TD OINT
1.0000 [in_us] | TOPICAL_OINTMENT | Freq: Four times a day (QID) | TRANSDERMAL | Status: DC
  Administered 2021-02-26 – 2021-02-28 (×7): 1 [in_us] via TOPICAL
  Filled 2021-02-26: qty 1
  Filled 2021-02-26: qty 30

## 2021-02-26 MED ORDER — RIVAROXABAN 15 MG PO TABS: 15.0000 mg | ORAL_TABLET | Freq: Every day | ORAL | Status: DC

## 2021-02-26 MED ORDER — FUROSEMIDE 10 MG/ML IJ SOLN
40.0000 mg | Freq: Once | INTRAMUSCULAR | Status: AC
  Administered 2021-02-26: 40 mg via INTRAVENOUS
  Filled 2021-02-26: qty 4

## 2021-02-26 MED ORDER — RIVAROXABAN 15 MG PO TABS
15.0000 mg | ORAL_TABLET | Freq: Every day | ORAL | Status: DC
  Administered 2021-02-26 – 2021-02-27 (×2): 15 mg via ORAL
  Filled 2021-02-26 (×2): qty 1

## 2021-02-26 MED ORDER — ACETAMINOPHEN 650 MG RE SUPP: 650.0000 mg | Freq: Four times a day (QID) | RECTAL | Status: DC | PRN

## 2021-02-26 MED ORDER — FUROSEMIDE 10 MG/ML IJ SOLN
40.0000 mg | Freq: Two times a day (BID) | INTRAMUSCULAR | Status: DC
  Administered 2021-02-27 – 2021-02-28 (×3): 40 mg via INTRAVENOUS
  Filled 2021-02-26 (×3): qty 4

## 2021-02-26 MED ORDER — HYDRALAZINE HCL 20 MG/ML IJ SOLN
10.0000 mg | INTRAMUSCULAR | Status: DC | PRN
  Administered 2021-02-27: 10 mg via INTRAVENOUS
  Filled 2021-02-26: qty 1

## 2021-02-26 MED ORDER — PANTOPRAZOLE SODIUM 40 MG PO TBEC
40.0000 mg | DELAYED_RELEASE_TABLET | Freq: Every day | ORAL | Status: DC
Start: 2021-02-26 — End: 2021-02-28
  Administered 2021-02-27 – 2021-02-28 (×2): 40 mg via ORAL
  Filled 2021-02-26 (×3): qty 1

## 2021-02-26 MED ORDER — METOPROLOL SUCCINATE ER 100 MG PO TB24
100.0000 mg | ORAL_TABLET | Freq: Every day | ORAL | Status: DC
  Administered 2021-02-27 – 2021-02-28 (×2): 100 mg via ORAL
  Filled 2021-02-26 (×2): qty 1

## 2021-02-26 MED ORDER — METOPROLOL TARTRATE 5 MG/5ML IV SOLN
5.0000 mg | Freq: Once | INTRAVENOUS | Status: AC
  Administered 2021-02-26: 5 mg via INTRAVENOUS
  Filled 2021-02-26: qty 5

## 2021-02-26 MED ORDER — ACETAMINOPHEN 325 MG PO TABS: 650.0000 mg | ORAL_TABLET | Freq: Four times a day (QID) | ORAL | Status: DC | PRN

## 2021-02-26 NOTE — ED Provider Notes (Signed)
Emergency Medicine Provider Triage Evaluation Note  [name] , [name] 80 y.o. male  was evaluated in triage.  Pt complains of LE edema. Sent by PCP. Patient states labs abnormal at PCP, he is unsure what labs. States "normal levels 800, however mine is 8000." Admits to SOB worse with laying flat. No blood in stool. Appear pale.  Review of Systems  Positive: SOB, LE edema Negative: Blood in stool, CP, cough  Physical Exam  There were no vitals taken for this visit. Gen:   Awake, no distress   Resp:  Normal effort  MSK:   Moves extremities without difficulty. 2 + pitting edema in Bilateral LE. Some sloughing of skin Skin:  Appear pale Other:    Medical Decision Making  Medically screening exam initiated at 3:40 PM.  Appropriate orders placed.  [name] was informed that the remainder of the evaluation will be completed by another provider, this initial triage assessment does not replace that evaluation, and the importance of remaining in the ED until their evaluation is complete.  Abnormal labs from PCP.  In chart review BNP  8563 from labs 2 days ago from [facility] medical  patient appears pale and fluid overloaded in extremities   Frank Holstine A, PA-C 02/26/21 1544    Frank Merino, MD 03/01/21 1125

## 2021-02-26 NOTE — ED Notes (Signed)
Attempted to call report x 1  
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
## 2021-02-26 NOTE — ED Notes (Signed)
Pt provided with [place] sandwich and sprite.

## 2021-02-26 NOTE — ED Triage Notes (Signed)
Pt arrives POV for eval of abnormal bloodwork drawn on Tuesday. States "normally my level is 800 but now 8000"- possibly to BNP. Hx of heart failure, endorses ongoing SOB.
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
## 2021-02-27 ENCOUNTER — Inpatient Hospital Stay (HOSPITAL_COMMUNITY): Payer: Medicare Other

## 2021-02-27 DIAGNOSIS — I5031 Acute diastolic (congestive) heart failure: Secondary | ICD-10-CM

## 2021-02-27 DIAGNOSIS — I10 Essential (primary) hypertension: Secondary | ICD-10-CM

## 2021-02-27 DIAGNOSIS — D509 Iron deficiency anemia, unspecified: Secondary | ICD-10-CM

## 2021-02-27 DIAGNOSIS — N184 Chronic kidney disease, stage 4 (severe): Secondary | ICD-10-CM

## 2021-02-27 LAB — ECHOCARDIOGRAM COMPLETE
AR max vel: 1.79 cm2
AV Area VTI: 1.94 cm2
AV Area mean vel: 1.74 cm2
AV Mean grad: 3.3 mmHg
AV Peak grad: 5.8 mmHg
Ao pk vel: 1.21 m/s
Height: 66 in
S' Lateral: 2.2 cm
Weight: 3160 oz

## 2021-02-27 LAB — CBC
HCT: 32.4 % — ABNORMAL LOW (ref 39.0–52.0)
Hemoglobin: 10 g/dL — ABNORMAL LOW (ref 13.0–17.0)
MCH: 25.1 pg — ABNORMAL LOW (ref 26.0–34.0)
MCHC: 30.9 g/dL (ref 30.0–36.0)
MCV: 81.2 fL (ref 80.0–100.0)
Platelets: 298 10*3/uL (ref 150–400)
RBC: 3.99 MIL/uL — ABNORMAL LOW (ref 4.22–5.81)
RDW: 19.2 % — ABNORMAL HIGH (ref 11.5–15.5)
WBC: 9 10*3/uL (ref 4.0–10.5)
nRBC: 0 % (ref 0.0–0.2)

## 2021-02-27 LAB — BASIC METABOLIC PANEL
Anion gap: 13 (ref 5–15)
BUN: 31 mg/dL — ABNORMAL HIGH (ref 8–23)
CO2: 27 mmol/L (ref 22–32)
Calcium: 9.3 mg/dL (ref 8.9–10.3)
Chloride: 100 mmol/L (ref 98–111)
Creatinine, Ser: 1.84 mg/dL — ABNORMAL HIGH (ref 0.61–1.24)
GFR, Estimated: 37 mL/min — ABNORMAL LOW (ref >60)
Glucose, Bld: 108 mg/dL — ABNORMAL HIGH (ref 70–99)
Potassium: 3.4 mmol/L — ABNORMAL LOW (ref 3.5–5.1)
Sodium: 140 mmol/L (ref 135–145)

## 2021-02-27 LAB — TSH: TSH: 0.902 u[IU]/mL (ref 0.350–4.500)

## 2021-02-27 LAB — MAGNESIUM: Magnesium: 1.8 mg/dL (ref 1.7–2.4)

## 2021-02-27 MED ORDER — POTASSIUM CHLORIDE CRYS ER 20 MEQ PO TBCR
40.0000 meq | EXTENDED_RELEASE_TABLET | Freq: Two times a day (BID) | ORAL | Status: AC
  Administered 2021-02-27 (×2): 40 meq via ORAL
  Filled 2021-02-27 (×2): qty 2

## 2021-02-27 MED ORDER — SODIUM CHLORIDE 0.9 % IV SOLN
100.0000 mg | Freq: Two times a day (BID) | INTRAVENOUS | Status: DC
  Administered 2021-02-27 (×2): 100 mg via INTRAVENOUS
  Filled 2021-02-27 (×4): qty 100

## 2021-02-27 MED ORDER — LISINOPRIL 10 MG PO TABS: 10.0000 mg | ORAL_TABLET | Freq: Two times a day (BID) | ORAL | Status: DC

## 2021-02-27 MED ORDER — POTASSIUM CHLORIDE CRYS ER 20 MEQ PO TBCR
20.0000 meq | EXTENDED_RELEASE_TABLET | Freq: Once | ORAL | Status: AC
  Administered 2021-02-27: 20 meq via ORAL
  Filled 2021-02-27: qty 1

## 2021-02-27 MED ORDER — PERFLUTREN LIPID MICROSPHERE
1.0000 mL | INTRAVENOUS | Status: AC | PRN
Start: 2021-02-27 — End: 2021-02-27
  Administered 2021-02-27: 4 mL via INTRAVENOUS
  Filled 2021-02-27: qty 10

## 2021-02-27 MED ORDER — MAGNESIUM SULFATE 2 GM/50ML IV SOLN
2.0000 g | Freq: Once | INTRAVENOUS | Status: AC
  Administered 2021-02-27: 2 g via INTRAVENOUS
  Filled 2021-02-27: qty 50

## 2021-02-27 NOTE — Progress Notes (Signed)
   02/26/21 2250  Assess: MEWS Score  Temp 98.2 F (36.8 C)  BP (!) 154/105  Pulse Rate 87  ECG Heart Rate (!) 102  Resp (!) 22  Level of Consciousness Alert  SpO2 98 %  O2 Device Room Air  Assess: MEWS Score  MEWS Temp 0  MEWS Systolic 0  MEWS Pulse 1  MEWS RR 1  MEWS LOC 0  MEWS Score 2  MEWS Score Color Yellow  Assess: if the MEWS score is Yellow or Red  Were vital signs taken at a resting state? Yes  Focused Assessment No change from prior assessment  Early Detection of Sepsis Score *See Row Information* Low  MEWS guidelines implemented *See Row Information* Yes  Treat  MEWS Interventions Escalated (See documentation below)  Pain Scale 0-10  Pain Score 0  Take Vital Signs  Increase Vital Sign Frequency  Yellow: Q 2hr X 2 then Q 4hr X 2, if remains yellow, continue Q 4hrs  Escalate  MEWS: Escalate Yellow: discuss with charge nurse/RN and consider discussing with provider and RRT  Notify: Charge Nurse/RN  Name of Charge Nurse/RN Notified Deborah, RN  Date Charge Nurse/RN Notified 02/26/21  Time Charge Nurse/RN Notified 2300  Document  Patient Outcome Other (Comment) (remains on unit)  Progress note created (see row info) Yes

## 2021-02-27 NOTE — Progress Notes (Signed)
  Echocardiogram 2D Echocardiogram has been performed.  [name] 02/27/2021, 3:18 PM

## 2021-02-27 NOTE — Progress Notes (Signed)
PROGRESS NOTE    [name]  INO:[id number] DOB: Feb 03, 1941 DOA: 02/26/2021 PCP: [name], MD   Brief Narrative:  Frank Cournoyer. is a 80 y.o. male with history of permanent atrial fibrillation, hypertension, chronic kidney disease stage IV, anemia presents to the ER because of worsening shortness of breath.  Patient has been getting increasing exertional shortness of breath over the last 1 week with increasing peripheral edema, gained around 6 pounds of weight.  Has noticed some redness of the lower extremities bilaterally over the last 2 days.    ED Course: In the ER patient was hypoxic and short of breath with elevated JVD and bilateral lower extremity edema and mild erythema.  Was initially in A. fib with RVR which improved with IV metoprolol.  Was given Lasix 40 mg IV for CHF.  Chest x-ray shows bilateral pleural effusion.  COVID test was negative.  Labs are significant for BNP of 1100 hemoglobin dropping to 9.5 from 11.  High sensitive troponin of at 22 and 19.  Patient admitted for acute CHF.  Assessment & Plan:  volume overload in the setting of acute CHF: -Patient presented with signs of fluid overload including shortness of breath, weight gain, bilateral lower extremity edema. -Chest x-ray shows bilateral pleural effusion.  BNP elevated 1120.7 -Continue Lasix IV twice daily -Repeat echo is pending. TSH: WNL -Monitor renal function and electrolytes closely. -Keep potassium above 4, magnesium above 2  A Fib with RVR: -Was given a dose of metoprolol in the ED. -Continue Toprol-XL 100 mg.  Continue Xarelto -Monitor heart rate closely on telemetry -Monitor electrolytes  Uncontrolled hypertension: BP is improving -Continue metoprolol.  Continue to hold lisinopril and HCTZ -Continue as needed hydralazine -Monitor blood pressure closely  CKD stage IV: -Creatinine trended up from 1.76-1.84. -Continue to hold lisinopril and HCTZ.  Continue diuresis -Monitor kidney  function closely.  ?  Bilateral lower extremity cellulitis: -He is afebrile.  No leukocytosis.  Continue Doxy  Normocytic anemia: -Hemoglobin between 9-10 -Monitor H&H closely and transfuse as needed.  Hypokalemia: Potassium 3.4: Replenished -Repeat BMP tomorrow a.m.  Hypomagnesemia: Replenished.  Repeat magnesium level tomorrow a.m.  GERD: Continue PPI  DVT prophylaxis: Xarelto Code Status: Full code Family Communication:  None present at bedside.  Plan of care discussed with patient in length and he verbalized understanding and agreed with it. He refused to  his wife for the update Disposition Plan: To be determined  Consultants:  None  Procedures:  None  Antimicrobials:  Doxycycline  Status is: Inpatient   Dispo: The patient is from: Home              Anticipated d/c is to: Home              Patient currently is not medically stable to d/c.   Difficult to place patient No         Subjective: Patient seen and examined.  Sitting comfortably on the bed.  Reports that his breathing has much improved however he continues to have bilateral lower extremity swelling.  Denies chest pain, orthopnea or PND.  No fever, chills.  He denies pain in legs.  No acute events overnight  Objective: Vitals:   02/27/21 0446 02/27/21 0724 02/27/21 0953 02/27/21 1043  BP: 120/79 (!) 145/87 101/60 120/89  Pulse: 100 75 (!) 121 66  Resp: 18 18  18   Temp: 97.9 F (36.6 C) 98.1 F (36.7 C)  98 F (36.7 C)  TempSrc: Oral  Oral  Oral  SpO2: 96% 100%  99%  Weight: 89.6 kg     Height:        Intake/Output Summary (Last 24 hours) at 02/27/2021 1130 Last data filed at 02/27/2021 1003 Gross per 24 hour  Intake --  Output 2675 ml  Net -2675 ml   Filed Weights   02/26/21 1542 02/26/21 2250 02/27/21 0446  Weight: 95.3 kg 92 kg 89.6 kg    Examination:  General exam: Appears calm and comfortable, on room air, hard of hearing, Respiratory system: Clear to auscultation.  Respiratory effort normal. Cardiovascular system: S1 & S2 heard, RRR. No JVD, murmurs, rubs, gallops or clicks.  Bilateral 3+ pitting edema positive  gastrointestinal system: Abdomen is nondistended, soft and nontender. No organomegaly or masses felt. Normal bowel sounds heard. Central nervous system: Alert and oriented. No focal neurological deficits. Extremities: Bilateral skin erythema noted in both lower extremities  skin: No rashes, lesions or ulcers Psychiatry: Judgement and insight appear normal. Mood & affect appropriate.    Data Reviewed: I have personally reviewed following labs and imaging studies  CBC: Recent Labs  Lab 02/26/21 1540 02/27/21 0423  WBC 6.7 9.0  NEUTROABS 5.3  --   HGB 9.5* 10.0*  HCT 31.9* 32.4*  MCV 84.8 81.2  PLT 251 397   Basic Metabolic Panel: Recent Labs  Lab 02/26/21 1540 02/27/21 0423 02/27/21 0444  NA 139 140  --   K 3.4* 3.4*  --   CL 103 100  --   CO2 26 27  --   GLUCOSE 118* 108*  --   BUN 32* 31*  --   CREATININE 1.76* 1.84*  --   CALCIUM 8.8* 9.3  --   MG  --   --  1.8   GFR: Estimated Creatinine Clearance: 35.2 mL/min (A) (by C-G formula based on SCr of 1.84 mg/dL (H)). Liver Function Tests: Recent Labs  Lab 02/26/21 1540  AST 39  ALT 54*  ALKPHOS 79  BILITOT 1.1  PROT 6.3*  ALBUMIN 3.4*   No results for input(s): LIPASE, AMYLASE in the last 168 hours. No results for input(s): AMMONIA in the last 168 hours. Coagulation Profile: No results for input(s): INR, PROTIME in the last 168 hours. Cardiac Enzymes: No results for input(s): CKTOTAL, CKMB, CKMBINDEX, TROPONINI in the last 168 hours. BNP (last 3 results) No results for input(s): PROBNP in the last 8760 hours. HbA1C: No results for input(s): HGBA1C in the last 72 hours. CBG: No results for input(s): GLUCAP in the last 168 hours. Lipid Profile: No results for input(s): CHOL, HDL, LDLCALC, TRIG, CHOLHDL, LDLDIRECT in the last 72 hours. Thyroid Function  Tests: Recent Labs    02/27/21 0423  TSH 0.902   Anemia Panel: No results for input(s): VITAMINB12, FOLATE, FERRITIN, TIBC, IRON, RETICCTPCT in the last 72 hours. Sepsis Labs: No results for input(s): PROCALCITON, LATICACIDVEN in the last 168 hours.  Recent Results (from the past 240 hour(s))  Resp Panel by RT-PCR (Flu A&B, Covid) Nasopharyngeal Swab     Status: None   Collection Time: 02/26/21  6:50 PM   Specimen: Nasopharyngeal Swab; Nasopharyngeal(NP) swabs in vial transport medium  Result Value Ref Range Status   SARS Coronavirus 2 by RT PCR NEGATIVE NEGATIVE Final    Comment: (NOTE) SARS-CoV-2 target nucleic acids are NOT DETECTED.  The SARS-CoV-2 RNA is generally detectable in upper respiratory specimens during the acute phase of infection. The lowest concentration of SARS-CoV-2 viral copies this assay can detect  is 138 copies/mL. A negative result does not preclude SARS-Cov-2 infection and should not be used as the sole basis for treatment or other patient management decisions. A negative result may occur with  improper specimen collection/handling, submission of specimen other than nasopharyngeal swab, presence of viral mutation(s) within the areas targeted by this assay, and inadequate number of viral copies(<138 copies/mL). A negative result must be combined with clinical observations, patient history, and epidemiological information. The expected result is Negative.  Fact Sheet for Patients:  EntrepreneurPulse.com.au  Fact Sheet for Healthcare Providers:  IncredibleEmployment.be  This test is no t yet approved or cleared by the [place] FDA and  has been authorized for detection and/or diagnosis of SARS-CoV-2 by FDA under an Emergency Use Authorization (EUA). This EUA will remain  in effect (meaning this test can be used) for the duration of the COVID-19 declaration under Section 564(b)(1) of the Act, 21 U.S.C.section  360bbb-3(b)(1), unless the authorization is terminated  or revoked sooner.       Influenza A by PCR NEGATIVE NEGATIVE Final   Influenza B by PCR NEGATIVE NEGATIVE Final    Comment: (NOTE) The Xpert Xpress SARS-CoV-2/FLU/RSV plus assay is intended as an aid in the diagnosis of influenza from Nasopharyngeal swab specimens and should not be used as a sole basis for treatment. Nasal washings and aspirates are unacceptable for Xpert Xpress SARS-CoV-2/FLU/RSV testing.  Fact Sheet for Patients: EntrepreneurPulse.com.au  Fact Sheet for Healthcare Providers: IncredibleEmployment.be  This test is not yet approved or cleared by the [place] FDA and has been authorized for detection and/or diagnosis of SARS-CoV-2 by FDA under an Emergency Use Authorization (EUA). This EUA will remain in effect (meaning this test can be used) for the duration of the COVID-19 declaration under Section 564(b)(1) of the Act, 21 U.S.C. section 360bbb-3(b)(1), unless the authorization is terminated or revoked.  Performed at [facility], [place] [address] [place], [place] [address]       Radiology Studies: DG Chest 2 View  Result Date: 02/26/2021 CLINICAL DATA:  Shortness of breath. EXAM: CHEST - 2 VIEW COMPARISON:  February 24, 2021. FINDINGS: Stable cardiomediastinal silhouette. No pneumothorax is noted. Hiatal hernia is again noted. Mild bibasilar subsegmental atelectasis or edema is noted with small pleural effusions. Bony thorax is unremarkable. IMPRESSION: Mild bibasilar subsegmental atelectasis or edema is noted with small pleural effusions. Hiatal hernia is again noted. Electronically Signed   By: Marijo Conception M.D.   On: 02/26/2021 16:30    Scheduled Meds:  furosemide  40 mg Intravenous Q12H   metoprolol succinate  100 mg Oral Daily   nitroGLYCERIN  1 inch Topical Q6H   pantoprazole  40 mg Oral Daily   potassium chloride  40 mEq Oral BID   Rivaroxaban  15 mg  Oral Q supper   Continuous Infusions:  doxycycline (VIBRAMYCIN) IV       LOS: 1 day   Time spent: 45 minutes   Estalene Bergey Loann Quill, MD [facility]  If 7PM-7AM, please contact night-coverage [email or web address] 02/27/2021, 11:30 AM

## 2021-02-28 DIAGNOSIS — I5033 Acute on chronic diastolic (congestive) heart failure: Secondary | ICD-10-CM

## 2021-02-28 LAB — CBC
HCT: 31.2 % — ABNORMAL LOW (ref 39.0–52.0)
Hemoglobin: 9.6 g/dL — ABNORMAL LOW (ref 13.0–17.0)
MCH: 25.1 pg — ABNORMAL LOW (ref 26.0–34.0)
MCHC: 30.8 g/dL (ref 30.0–36.0)
MCV: 81.5 fL (ref 80.0–100.0)
Platelets: 240 10*3/uL (ref 150–400)
RBC: 3.83 MIL/uL — ABNORMAL LOW (ref 4.22–5.81)
RDW: 18.9 % — ABNORMAL HIGH (ref 11.5–15.5)
WBC: 7.7 10*3/uL (ref 4.0–10.5)
nRBC: 0 % (ref 0.0–0.2)

## 2021-02-28 LAB — BASIC METABOLIC PANEL
Anion gap: 11 (ref 5–15)
BUN: 36 mg/dL — ABNORMAL HIGH (ref 8–23)
CO2: 29 mmol/L (ref 22–32)
Calcium: 8.7 mg/dL — ABNORMAL LOW (ref 8.9–10.3)
Chloride: 99 mmol/L (ref 98–111)
Creatinine, Ser: 1.99 mg/dL — ABNORMAL HIGH (ref 0.61–1.24)
GFR, Estimated: 34 mL/min — ABNORMAL LOW (ref >60)
Glucose, Bld: 105 mg/dL — ABNORMAL HIGH (ref 70–99)
Potassium: 3.2 mmol/L — ABNORMAL LOW (ref 3.5–5.1)
Sodium: 139 mmol/L (ref 135–145)

## 2021-02-28 LAB — MAGNESIUM: Magnesium: 2 mg/dL (ref 1.7–2.4)

## 2021-02-28 MED ORDER — POTASSIUM CHLORIDE CRYS ER 20 MEQ PO TBCR
30.0000 meq | EXTENDED_RELEASE_TABLET | ORAL | Status: AC
  Administered 2021-02-28 (×2): 30 meq via ORAL
  Filled 2021-02-28 (×2): qty 1

## 2021-02-28 MED ORDER — FUROSEMIDE 20 MG PO TABS: 20.0000 mg | ORAL_TABLET | Freq: Every day | ORAL | 2 refills | Status: DC

## 2021-02-28 MED ORDER — METOPROLOL SUCCINATE ER 50 MG PO TB24: 50.0000 mg | ORAL_TABLET | Freq: Every day | ORAL | 2 refills | Status: DC

## 2021-02-28 MED ORDER — FUROSEMIDE 40 MG PO TABS
40.0000 mg | ORAL_TABLET | Freq: Every day | ORAL | Status: DC
  Administered 2021-02-28: 40 mg via ORAL
  Filled 2021-02-28: qty 1

## 2021-02-28 MED ORDER — POTASSIUM CHLORIDE CRYS ER 10 MEQ PO TBCR: 10.0000 meq | EXTENDED_RELEASE_TABLET | Freq: Every day | ORAL | 2 refills | Status: DC

## 2021-02-28 MED ORDER — DOXYCYCLINE MONOHYDRATE 100 MG PO CAPS: 100.0000 mg | ORAL_CAPSULE | Freq: Two times a day (BID) | ORAL | 0 refills | Status: AC

## 2021-02-28 MED ORDER — METOPROLOL SUCCINATE ER 100 MG PO TB24: 100.0000 mg | ORAL_TABLET | Freq: Every day | ORAL | 3 refills | Status: DC

## 2021-02-28 NOTE — Discharge Summary (Signed)
Physician Discharge Summary  [place]. OZH:[id number] DOB: 1941-07-05 DOA: 02/26/2021  PCP: [name], MD  Admit date: 02/26/2021 Discharge date: 02/28/2021  Admitted From: Home Disposition: Home  Recommendations for Outpatient Follow-up:  Follow up with PCP in 1-2 weeks Follow-up with cardiology in 2-4 weeks Discontinue lisinopril and HCTZ due to worsening renal insufficiency Increased metoprolol succinate to 150 mg p.o. daily for better rate control of his atrial fibrillation Started on furosemide 20 mg p.o. daily with 10 mEq potassium supplement Please obtain BMP in one week no function and potassium level  Home Health: None Equipment/Devices: None  Discharge Condition: Stable CODE STATUS: DNR Diet recommendation: Heart healthy diet  History of present illness:  [name] is a 80 year old male with past medical history significant for permanent atrial fibrillation, essential hypertension, CKD stage IV, anemia who presents to [facility] ED on 02/26/2021 with worsening shortness of breath.  Patient also reports 6 pound weight gain with worsening lower extremity edema.  ED, patient was noted be hypoxic, with significant dyspnea with elevated JVD.  Patient also had bilateral lower extremity edema with mild erythema.  Chest x-ray consistent with pulmonary edema.  High sensitive troponin 22 followed by 19.  Patient was in A. fib with RVR which improved with IV metoprolol.  Patient was given 40 mg IV Lasix.  TRH consulted for further evaluation and management of acute on chronic diastolic congestive heart failure exacerbation.   Hospital course:  Acute on chronic diastolic congestive heart failure Patient presenting to ED with progressive shortness of breath and lower extremity edema.  Was found to be hypoxic at presentation.  Chest x-ray consistent with pulmonary edema.  TTE with LVEF 65-70%, LV with no regional wall motion normalities, RA/LA mildly dilated, IVC dilated.   Patient was started on IV furosemide and was able to be titrated off supple oxygen.  No significant desaturations while ambulating on room air.  Will discharge on furosemide 20 mg p.o. daily.  Instructed patient to maintain daily weight log to bring to next PCP/cardiology visit.  Atrial fibrillation with RVR On presentation patient was noted to be in A. fib with RVR.  Treated with IV metoprolol.  Patient's home metoprolol succinate was increased to 150 mg p.o. daily.  Continue Xarelto.  Outpatient follow-up with PCP/cardiology.  Essential hypertension Patient on metoprolol succinate 100 mg p.o. daily, lisinopril and HCTZ outpatient.  Lisinopril and HCTZ was discontinued due to congestive heart failure as above as well as renal insufficiency.  Metoprolol succinate increased 250 mg p.o. daily.  Started on furosemide as above.  Outpatient follow-up with PCP.  CKD stage IV Discontinued home lisinopril and HCTZ.  Consider outpatient referral to nephrology for further management.  Creatinine 1.99 at time of discharge.  Recommend BMP 1 week.  Dilation ascending aorta TTE notable for mild dilation of the ascending aorta measuring 38 mm.  Continue outpatient surveillance.  Bilateral lower extremity cellulitis Continue doxycycline 100 mg p.o. twice daily to complete 10-day course    Discharge Diagnoses:  Principal Problem:   Acute on chronic congestive heart failure (HCC) Active Problems:   Atrial fibrillation with RVR (HCC)   Essential hypertension   Normocytic hypochromic anemia   CKD (chronic kidney disease) stage 4, GFR 15-29 ml/min [facility])    Discharge Instructions  Discharge Instructions     (HEART FAILURE PATIENTS) Call MD:  Anytime you have any of the following symptoms: 1) 3 pound weight gain in 24 hours or 5 pounds in 1  week 2) shortness of breath, with or without a dry hacking cough 3) swelling in the hands, feet or stomach 4) if you have to sleep on extra pillows at night in order  to breathe.   Complete by: As directed    Call MD for:  difficulty breathing, headache or visual disturbances   Complete by: As directed    Call MD for:  extreme fatigue   Complete by: As directed    Call MD for:  persistant dizziness or light-headedness   Complete by: As directed    Call MD for:  persistant nausea and vomiting   Complete by: As directed    Call MD for:  severe uncontrolled pain   Complete by: As directed    Call MD for:  temperature >100.4   Complete by: As directed    Diet - low sodium heart healthy   Complete by: As directed    Increase activity slowly   Complete by: As directed       Allergies as of 02/28/2021   No Known Allergies      Medication List     STOP taking these medications    hydrochlorothiazide 25 MG tablet Commonly known as: HYDRODIURIL   lisinopril 10 MG tablet Commonly known as: ZESTRIL       TAKE these medications    colchicine 0.6 MG tablet Take 1 tablet (0.6 mg total) by mouth daily.   doxycycline 100 MG capsule Commonly known as: MONODOX Take 1 capsule (100 mg total) by mouth 2 (two) times daily for 8 days.   furosemide 20 MG tablet Commonly known as: Lasix Take 1 tablet (20 mg total) by mouth daily.   metoprolol succinate 50 MG 24 hr tablet Commonly known as: Toprol XL Take 1 tablet (50 mg total) by mouth daily. Take with or immediately following a meal. Take with 100mg  tablet daily for total dose 150mg  daily What changed: You were already taking a medication with the same name, and this prescription was added. Make sure you understand how and when to take each.   metoprolol succinate 100 MG 24 hr tablet Commonly known as: Toprol XL Take 1 tablet (100 mg total) by mouth daily. Take with 50mg  tablet daily for total dose 150mg  daily What changed: additional instructions   pantoprazole 40 MG tablet Commonly known as: PROTONIX Take 1 tablet (40 mg total) by mouth daily.   potassium chloride 10 MEQ  tablet Commonly known as: KLOR-CON Take 1 tablet (10 mEq total) by mouth daily.   Rivaroxaban 15 MG Tabs tablet Commonly known as: XARELTO Take 1 tablet (15 mg total) by mouth daily.   Tylenol 325 MG Caps Generic drug: Acetaminophen Take 325 mg by mouth daily as needed (For Pain).        Follow-up Information     [name], MD. Schedule an appointment as soon as possible for a visit in 1 week(s).   Specialty: Internal Medicine Contact information: [place] [address] [phone]         Sueanne Margarita, MD. Schedule an appointment as soon as possible for a visit in 2 week(s).   Specialty: Cardiology Contact information: [address] [place] [address] [phone]                No Known Allergies  Consultations: None   Procedures/Studies: DG Chest 2 View  Result Date: 02/26/2021 CLINICAL DATA:  Shortness of breath. EXAM: CHEST - 2 VIEW  COMPARISON:  February 24, 2021. FINDINGS: Stable cardiomediastinal silhouette. No pneumothorax is noted. Hiatal hernia is again noted. Mild bibasilar subsegmental atelectasis or edema is noted with small pleural effusions. Bony thorax is unremarkable. IMPRESSION: Mild bibasilar subsegmental atelectasis or edema is noted with small pleural effusions. Hiatal hernia is again noted. Electronically Signed   By: Marijo Conception M.D.   On: 02/26/2021 16:30   ECHOCARDIOGRAM COMPLETE  Result Date: 02/27/2021    ECHOCARDIOGRAM REPORT   Patient Name:   [name] Date of Exam: 02/27/2021 Medical Rec #:  [id number]          Height:       66.0 in Accession #:    [id number]         Weight:       197.5 lb Date of Birth:  Mar 15, 1941         BSA:          1.989 m Patient Age:    80 years           BP:           120/89 mmHg Patient Gender: M                  HR:           109 bpm. Exam Location:  Inpatient Procedure: 2D Echo, Color Doppler, Cardiac Doppler and Intracardiac             Opacification Agent Indications:    CHF-Acute Diastolic  History:        Patient has prior history of Echocardiogram examinations, most                 recent 02/26/2020. Arrythmias:Atrial Fibrillation; Risk                 Factors:Hypertension and Dyslipidemia. CKD.  Sonographer:    Clayton Lefort RDCS (AE) Referring Phys: [place]  Sonographer Comments: Technically difficult study due to poor echo windows, suboptimal subcostal window and patient is morbidly obese. IMPRESSIONS  1. Left ventricular ejection fraction, by estimation, is 65 to 70%. The left ventricle has normal function. The left ventricle has no regional wall motion abnormalities. There is severe concentric left ventricular hypertrophy. Left ventricular diastolic  function could not be evaluated.  2. Right ventricular systolic function is normal. The right ventricular size is normal. There is mildly elevated pulmonary artery systolic pressure. The estimated right ventricular systolic pressure is 68.1 mmHg.  3. Left atrial size was mildly dilated.  4. Right atrial size was mildly dilated.  5. The mitral valve is normal in structure. Trivial mitral valve regurgitation. No evidence of mitral stenosis.  6. The aortic valve is tricuspid. Aortic valve regurgitation is trivial. Mild to moderate aortic valve sclerosis/calcification is present, without any evidence of aortic stenosis. Aortic valve area, by VTI measures 1.94 cm. Aortic valve mean gradient measures 3.3 mmHg. Aortic valve Vmax measures 1.21 m/s.  7. Aortic dilatation noted. There is mild dilatation of the ascending aorta, measuring 38 mm.  8. The inferior vena cava is dilated in size with >50% respiratory variability, suggesting right atrial pressure of 8 mmHg. FINDINGS  Left Ventricle: Left ventricular ejection fraction, by estimation, is 65 to 70%. The left ventricle has normal function. The left ventricle has no regional wall motion abnormalities. Definity contrast agent was given  IV to delineate the left ventricular  endocardial borders. The left ventricular internal cavity size was normal in size.  There is severe concentric left ventricular hypertrophy. Left ventricular diastolic function could not be evaluated due to atrial fibrillation. Left ventricular diastolic  function could not be evaluated. Right Ventricle: The right ventricular size is normal. No increase in right ventricular wall thickness. Right ventricular systolic function is normal. There is mildly elevated pulmonary artery systolic pressure. The tricuspid regurgitant velocity is 2.86  m/s, and with an assumed right atrial pressure of 8 mmHg, the estimated right ventricular systolic pressure is 16.1 mmHg. Left Atrium: Left atrial size was mildly dilated. Right Atrium: Right atrial size was mildly dilated. Pericardium: There is no evidence of pericardial effusion. Mitral Valve: The mitral valve is normal in structure. Trivial mitral valve regurgitation. No evidence of mitral valve stenosis. Tricuspid Valve: The tricuspid valve is normal in structure. Tricuspid valve regurgitation is trivial. No evidence of tricuspid stenosis. Aortic Valve: The aortic valve is tricuspid. Aortic valve regurgitation is trivial. Mild to moderate aortic valve sclerosis/calcification is present, without any evidence of aortic stenosis. Aortic valve mean gradient measures 3.3 mmHg. Aortic valve peak  gradient measures 5.8 mmHg. Aortic valve area, by VTI measures 1.94 cm. Pulmonic Valve: The pulmonic valve was normal in structure. Pulmonic valve regurgitation is not visualized. No evidence of pulmonic stenosis. Aorta: Aortic dilatation noted. There is mild dilatation of the ascending aorta, measuring 38 mm. Venous: The inferior vena cava is dilated in size with greater than 50% respiratory variability, suggesting right atrial pressure of 8 mmHg. IAS/Shunts: No atrial level shunt detected by color flow Doppler.  LEFT VENTRICLE PLAX 2D LVIDd:          3.40 cm LVIDs:         2.20 cm LV PW:         1.60 cm LV IVS:        1.70 cm LVOT diam:     1.90 cm LV SV:         35 LV SV Index:   17 LVOT Area:     2.84 cm  RIGHT VENTRICLE             IVC RV Basal diam:  3.80 cm     IVC diam: 2.20 cm RV Mid diam:    4.00 cm RV S prime:     12.20 cm/s TAPSE (M-mode): 1.5 cm LEFT ATRIUM            Index       RIGHT ATRIUM           Index LA diam:      3.50 cm  1.76 cm/m  RA Area:     25.00 cm LA Vol (A2C): 78.3 ml  39.36 ml/m RA Volume:   74.40 ml  37.40 ml/m LA Vol (A4C): 142.0 ml 71.39 ml/m  AORTIC VALVE AV Area (Vmax):    1.79 cm AV Area (Vmean):   1.74 cm AV Area (VTI):     1.94 cm AV Vmax:           120.67 cm/s AV Vmean:          82.867 cm/s AV VTI:            0.179 m AV Peak Grad:      5.8 mmHg AV Mean Grad:      3.3 mmHg LVOT Vmax:         76.37 cm/s LVOT Vmean:        51.000 cm/s LVOT VTI:          0.123 m  LVOT/AV VTI ratio: 0.68  AORTA Ao Root diam: 3.30 cm Ao Asc diam:  3.80 cm TRICUSPID VALVE TR Peak grad:   32.7 mmHg TR Vmax:        286.00 cm/s  SHUNTS Systemic VTI:  0.12 m Systemic Diam: 1.90 cm [name] MD Electronically signed by [name] MD Signature Date/Time: 02/27/2021/5:32:06 PM    Final      Subjective: Patient seen examined at bedside, resting comfortably in the bedside chair.  States he feels much better and ready for discharge home.  Performed ambulatory O2 screening with no significant desaturations today.  Discussed with patient medication changes.  No other questions or concerns at this time.  Denies headache, no fever/chills/night sweats, no nausea/vomiting/diarrhea, no chest pain, no palpitations, no shortness of breath, no abdominal pain, no weakness, no fatigue, no paresthesias.  No acute events overnight per nursing staff.  Discharge Exam: Vitals:   02/28/21 0823 02/28/21 1000  BP: (!) 99/57 124/74  Pulse: (!) 37 (!) 105  Resp: 16   Temp: 98.2 F (36.8 C)   SpO2: (!) 86% 100%   Vitals:   02/28/21 0043 02/28/21 0446  02/28/21 0823 02/28/21 1000  BP: 112/88 (!) 143/98 (!) 99/57 124/74  Pulse:  (!) 110 (!) 37 (!) 105  Resp: 16 18 16    Temp: 98.2 F (36.8 C) 97.7 F (36.5 C) 98.2 F (36.8 C)   TempSrc: Oral Oral Oral   SpO2: 97% 93% (!) 86% 100%  Weight:  87.4 kg    Height:        General: Pt is alert, awake, not in acute distress, elderly in appearance Cardiovascular: Irregularly irregular rhythm, S1/S2 +, no rubs, no gallops Respiratory: CTA bilaterally, no wheezing, no rhonchi, on room air Abdominal: Soft, NT, ND, bowel sounds + Extremities: Edema bilateral lower extremities up to mid shin, slight erythema, no cyanosis    The results of significant diagnostics from this hospitalization (including imaging, microbiology, ancillary and laboratory) are listed below for reference.     Microbiology: Recent Results (from the past 240 hour(s))  Resp Panel by RT-PCR (Flu A&B, Covid) Nasopharyngeal Swab     Status: None   Collection Time: 02/26/21  6:50 PM   Specimen: Nasopharyngeal Swab; Nasopharyngeal(NP) swabs in vial transport medium  Result Value Ref Range Status   SARS Coronavirus 2 by RT PCR NEGATIVE NEGATIVE Final    Comment: (NOTE) SARS-CoV-2 target nucleic acids are NOT DETECTED.  The SARS-CoV-2 RNA is generally detectable in upper respiratory specimens during the acute phase of infection. The lowest concentration of SARS-CoV-2 viral copies this assay can detect is 138 copies/mL. A negative result does not preclude SARS-Cov-2 infection and should not be used as the sole basis for treatment or other patient management decisions. A negative result may occur with  improper specimen collection/handling, submission of specimen other than nasopharyngeal swab, presence of viral mutation(s) within the areas targeted by this assay, and inadequate number of viral copies(<138 copies/mL). A negative result must be combined with clinical observations, patient history, and  epidemiological information. The expected result is Negative.  Fact Sheet for Patients:  EntrepreneurPulse.com.au  Fact Sheet for Healthcare Providers:  IncredibleEmployment.be  This test is no t yet approved or cleared by the [place] FDA and  has been authorized for detection and/or diagnosis of SARS-CoV-2 by FDA under an Emergency Use Authorization (EUA). This EUA will remain  in effect (meaning this test can be used) for the duration of the COVID-19 declaration under  Section 564(b)(1) of the Act, 21 U.S.C.section 360bbb-3(b)(1), unless the authorization is terminated  or revoked sooner.       Influenza A by PCR NEGATIVE NEGATIVE Final   Influenza B by PCR NEGATIVE NEGATIVE Final    Comment: (NOTE) The Xpert Xpress SARS-CoV-2/FLU/RSV plus assay is intended as an aid in the diagnosis of influenza from Nasopharyngeal swab specimens and should not be used as a sole basis for treatment. Nasal washings and aspirates are unacceptable for Xpert Xpress SARS-CoV-2/FLU/RSV testing.  Fact Sheet for Patients: EntrepreneurPulse.com.au  Fact Sheet for Healthcare Providers: IncredibleEmployment.be  This test is not yet approved or cleared by the [place] FDA and has been authorized for detection and/or diagnosis of SARS-CoV-2 by FDA under an Emergency Use Authorization (EUA). This EUA will remain in effect (meaning this test can be used) for the duration of the COVID-19 declaration under Section 564(b)(1) of the Act, 21 U.S.C. section 360bbb-3(b)(1), unless the authorization is terminated or revoked.  Performed at [facility], [place] [address] [place], [place] [address]      Labs: BNP (last 3 results) Recent Labs    03/01/20 0358 03/02/20 0615 02/26/21 1540  BNP 275.6* 396.1* 2,094.7*   Basic Metabolic Panel: Recent Labs  Lab 02/26/21 1540 02/27/21 0423 02/27/21 0444 02/28/21 0215   NA 139 140  --  139  K 3.4* 3.4*  --  3.2*  CL 103 100  --  99  CO2 26 27  --  29  GLUCOSE 118* 108*  --  105*  BUN 32* 31*  --  36*  CREATININE 1.76* 1.84*  --  1.99*  CALCIUM 8.8* 9.3  --  8.7*  MG  --   --  1.8 2.0   Liver Function Tests: Recent Labs  Lab 02/26/21 1540  AST 39  ALT 54*  ALKPHOS 79  BILITOT 1.1  PROT 6.3*  ALBUMIN 3.4*   No results for input(s): LIPASE, AMYLASE in the last 168 hours. No results for input(s): AMMONIA in the last 168 hours. CBC: Recent Labs  Lab 02/26/21 1540 02/27/21 0423 02/28/21 0215  WBC 6.7 9.0 7.7  NEUTROABS 5.3  --   --   HGB 9.5* 10.0* 9.6*  HCT 31.9* 32.4* 31.2*  MCV 84.8 81.2 81.5  PLT 251 298 240   Cardiac Enzymes: No results for input(s): CKTOTAL, CKMB, CKMBINDEX, TROPONINI in the last 168 hours. BNP: Invalid input(s): POCBNP CBG: No results for input(s): GLUCAP in the last 168 hours. D-Dimer No results for input(s): DDIMER in the last 72 hours. Hgb A1c No results for input(s): HGBA1C in the last 72 hours. Lipid Profile No results for input(s): CHOL, HDL, LDLCALC, TRIG, CHOLHDL, LDLDIRECT in the last 72 hours. Thyroid function studies Recent Labs    02/27/21 0423  TSH 0.902   Anemia work up No results for input(s): VITAMINB12, FOLATE, FERRITIN, TIBC, IRON, RETICCTPCT in the last 72 hours. Urinalysis    Component Value Date/Time   COLORURINE RED (A) 04/23/2020 1355   APPEARANCEUR TURBID (A) 04/23/2020 1355   LABSPEC  04/23/2020 1355    TEST NOT REPORTED DUE TO COLOR INTERFERENCE OF URINE PIGMENT   PHURINE  04/23/2020 1355    TEST NOT REPORTED DUE TO COLOR INTERFERENCE OF URINE PIGMENT   GLUCOSEU (A) 04/23/2020 1355    TEST NOT REPORTED DUE TO COLOR INTERFERENCE OF URINE PIGMENT   HGBUR (A) 04/23/2020 1355    TEST NOT REPORTED DUE TO COLOR INTERFERENCE OF URINE PIGMENT  BILIRUBINUR (A) 04/23/2020 1355    TEST NOT REPORTED DUE TO COLOR INTERFERENCE OF URINE PIGMENT   KETONESUR (A) 04/23/2020 1355     TEST NOT REPORTED DUE TO COLOR INTERFERENCE OF URINE PIGMENT   PROTEINUR (A) 04/23/2020 1355    TEST NOT REPORTED DUE TO COLOR INTERFERENCE OF URINE PIGMENT   UROBILINOGEN 0.2 04/19/2015 1030   NITRITE (A) 04/23/2020 1355    TEST NOT REPORTED DUE TO COLOR INTERFERENCE OF URINE PIGMENT   LEUKOCYTESUR (A) 04/23/2020 1355    TEST NOT REPORTED DUE TO COLOR INTERFERENCE OF URINE PIGMENT   Sepsis Labs Invalid input(s): PROCALCITONIN,  WBC,  LACTICIDVEN Microbiology Recent Results (from the past 240 hour(s))  Resp Panel by RT-PCR (Flu A&B, Covid) Nasopharyngeal Swab     Status: None   Collection Time: 02/26/21  6:50 PM   Specimen: Nasopharyngeal Swab; Nasopharyngeal(NP) swabs in vial transport medium  Result Value Ref Range Status   SARS Coronavirus 2 by RT PCR NEGATIVE NEGATIVE Final    Comment: (NOTE) SARS-CoV-2 target nucleic acids are NOT DETECTED.  The SARS-CoV-2 RNA is generally detectable in upper respiratory specimens during the acute phase of infection. The lowest concentration of SARS-CoV-2 viral copies this assay can detect is 138 copies/mL. A negative result does not preclude SARS-Cov-2 infection and should not be used as the sole basis for treatment or other patient management decisions. A negative result may occur with  improper specimen collection/handling, submission of specimen other than nasopharyngeal swab, presence of viral mutation(s) within the areas targeted by this assay, and inadequate number of viral copies(<138 copies/mL). A negative result must be combined with clinical observations, patient history, and epidemiological information. The expected result is Negative.  Fact Sheet for Patients:  EntrepreneurPulse.com.au  Fact Sheet for Healthcare Providers:  IncredibleEmployment.be  This test is no t yet approved or cleared by the [place] FDA and  has been authorized for detection and/or diagnosis of SARS-CoV-2 by FDA  under an Emergency Use Authorization (EUA). This EUA will remain  in effect (meaning this test can be used) for the duration of the COVID-19 declaration under Section 564(b)(1) of the Act, 21 U.S.C.section 360bbb-3(b)(1), unless the authorization is terminated  or revoked sooner.       Influenza A by PCR NEGATIVE NEGATIVE Final   Influenza B by PCR NEGATIVE NEGATIVE Final    Comment: (NOTE) The Xpert Xpress SARS-CoV-2/FLU/RSV plus assay is intended as an aid in the diagnosis of influenza from Nasopharyngeal swab specimens and should not be used as a sole basis for treatment. Nasal washings and aspirates are unacceptable for Xpert Xpress SARS-CoV-2/FLU/RSV testing.  Fact Sheet for Patients: EntrepreneurPulse.com.au  Fact Sheet for Healthcare Providers: IncredibleEmployment.be  This test is not yet approved or cleared by the [place] FDA and has been authorized for detection and/or diagnosis of SARS-CoV-2 by FDA under an Emergency Use Authorization (EUA). This EUA will remain in effect (meaning this test can be used) for the duration of the COVID-19 declaration under Section 564(b)(1) of the Act, 21 U.S.C. section 360bbb-3(b)(1), unless the authorization is terminated or revoked.  Performed at [facility], [place] [address], [place], [place] [address]      Time coordinating discharge: Over 30 minutes  SIGNED:   Donnamarie Poag [place] DO  [facility] 02/28/2021, 12:01 PM

## 2021-03-03 ENCOUNTER — Other Ambulatory Visit: Payer: Self-pay

## 2021-03-03 DIAGNOSIS — I5033 Acute on chronic diastolic (congestive) heart failure: Secondary | ICD-10-CM

## 2021-03-04 ENCOUNTER — Other Ambulatory Visit: Payer: Self-pay | Admitting: *Deleted

## 2021-03-04 DIAGNOSIS — Z09 Encounter for follow-up examination after completed treatment for conditions other than malignant neoplasm: Secondary | ICD-10-CM | POA: Diagnosis not present

## 2021-03-04 DIAGNOSIS — I4891 Unspecified atrial fibrillation: Secondary | ICD-10-CM | POA: Diagnosis not present

## 2021-03-04 DIAGNOSIS — I10 Essential (primary) hypertension: Secondary | ICD-10-CM | POA: Diagnosis not present

## 2021-03-04 DIAGNOSIS — R0602 Shortness of breath: Secondary | ICD-10-CM | POA: Diagnosis not present

## 2021-03-04 DIAGNOSIS — R600 Localized edema: Secondary | ICD-10-CM | POA: Diagnosis not present

## 2021-03-04 NOTE — Patient Outreach (Signed)
[name] Care Management  03/04/2021  [name] 1941/01/15 [id number]   Referral Date: 6/14 Referral Source: Hospital liaison Referral Reason: Post hospital discharge Insurance: [place] attempt #1, unsuccessful, HIPAA compliant voice message left.    Plan: RN CM will send outreach letter and follow up within the next 3-4 business days.  [name], [place], MSN [place] [phone]

## 2021-03-05 ENCOUNTER — Encounter: Payer: Self-pay | Admitting: *Deleted

## 2021-03-05 ENCOUNTER — Other Ambulatory Visit: Payer: Self-pay | Admitting: *Deleted

## 2021-03-05 NOTE — Patient Outreach (Signed)
[facility]) Care Management  [place]  03/05/2021   [name] [birth year] [id number]   Referral Date: 6/14 Referral Source: Hospital liaison Referral Reason: Post hospital discharge Insurance: [facility]   Incoming call received from wife, Identity verified.  This care manager introduced self and stated purpose of call.  [facility] care management services explained.    Social: Lives with wife, report member is able to perform all ADL's independently.  She prepares all meals stating she does not use salt but member adds it once she is done cooking.  Discussed using salt substitute, verbalizes understanding.   Conditions: Per chart, has history of HTN, A-fib, CKD, CHF, cellulitis, BPH, and HLD.  Medications: Reviewed with wife, state member is taking as instructed.  She is concerned about the cost of xarelto and colchicine, agrees to pharmacy involvement.  Appointments: Member has had appointment with PCP yesterday, will follow up with cardiology on 7/19.  Wife is providing transportation.  Encounter Medications:  Outpatient Encounter Medications as of 03/05/2021  Medication Sig   Acetaminophen (TYLENOL) 325 MG CAPS Take 325 mg by mouth daily as needed (For Pain).   colchicine 0.6 MG tablet Take 1 tablet (0.6 mg total) by mouth daily.   doxycycline (MONODOX) 100 MG capsule Take 1 capsule (100 mg total) by mouth 2 (two) times daily for 8 days.   furosemide (LASIX) 20 MG tablet Take 1 tablet (20 mg total) by mouth daily.   metoprolol succinate (TOPROL XL) 100 MG 24 hr tablet Take 1 tablet (100 mg total) by mouth daily. Take with 50mg  tablet daily for total dose 150mg  daily   metoprolol succinate (TOPROL XL) 50 MG 24 hr tablet Take 1 tablet (50 mg total) by mouth daily. Take with or immediately following a meal. Take with 100mg  tablet daily for total dose 150mg  daily   pantoprazole (PROTONIX) 40 MG tablet Take 1 tablet (40 mg total) by mouth daily.   potassium  chloride (KLOR-CON) 10 MEQ tablet Take 1 tablet (10 mEq total) by mouth daily.   Rivaroxaban (XARELTO) 15 MG TABS tablet Take 1 tablet (15 mg total) by mouth daily.   No facility-administered encounter medications on file as of 03/05/2021.    Functional Status:  In your present state of health, do you have any difficulty performing the following activities: 02/26/2021  Hearing? Y  Vision? N  Difficulty concentrating or making decisions? N  Walking or climbing stairs? N  Dressing or bathing? N  Doing errands, shopping? N  Some recent data might be hidden    Fall/Depression Screening: Fall Risk  03/05/2021  Falls in the past year? 0  Number falls in past yr: 0  Injury with Fall? 0  Follow up Education provided;Falls evaluation completed   No flowsheet data found.  Assessment:   Care Plan Care Plan : Heart Failure (Adult)  Updates made by [name], RN since 03/05/2021 12:00 AM     Problem: Symptom Exacerbation (Heart Failure)      Goal: Symptom Exacerbation Prevented or Minimized   Start Date: 03/05/2021  Expected End Date: 04/04/2021  Note:   Evidence-based guidance:  Perform or review cognitive and/or health literacy screening.  Assess understanding of adherence and barriers to treatment plan, as well as lifestyle changes; develop strategies to address barriers.  Establish a mutually-agreed-upon early intervention process to communicate with primary care provider when signs/symptoms worsen.  Facilitate timely posthospital discharge or emergency department treatment that includes intensive follow-up via telephone calls, home  visit, telehealth monitoring and care at multidisciplinary heart failure clinic.  Adjust frequency and intensity of follow-up based on presentation, number of emergency department visits, hospital admissions and frequency and severity of symptom exacerbation.  Facilitate timely visit, usually within 1 week, with primary care provider following hospital  discharge.  Collaborate with clinical pharmacist to address adverse drug reactions, drug interactions, subtherapeutic dosage, patient and family education.  Regularly screen for presence of depressive symptoms using a validated tool; consider pharmacologic therapy and/or referral for cognitive behavioral therapy when present.  Refer to community-based services, such as a heart failure support group, community Economist or peer support program.  Review immunization status; arrange receipt of needed vaccinations.  Prepare patient for home oxygen use based on signs/symptoms.   Notes:     Task: Identify and Minimize Risk of Heart Failure Exacerbation   Due Date: 04/04/2021  Note:   Care Management Activities:    - counseling with pharmacist provided - healthy lifestyle promoted - rescue (action) plan reviewed    Notes:     Problem: Disease Progression (Heart Failure)      Long-Range Goal: Health Optimized   Start Date: 03/05/2021  Expected End Date: 06/03/2021  Priority: Medium  Note:   Evidence-based guidance:  Use brief intervention, such as 5 A's (Ask, Advise, Assess, Assist, Arrange) to encourage smoking cessation; refer to smoking cessation program, if ready for more intensive intervention.  Perform or refer to a registered dietitian for a nutrition assessment and nutrition-focused physical exam.   Identify potential micronutrient deficiencies, such as iron, vitamin D and thiamin.  Assess need for potential diet and fluid modification, such as reduced sodium or fluid intake.  Minimize unnecessary dietary restrictions to increase oral intake. Note: Sodium restriction should be individualized to the patient and clinical status.  Facilitate home monitoring of weight.   Notes:     Task: Optimize Health   Due Date: 06/03/2021  Note:   Care Management Activities:    - fluid modification encouraged - home monitoring of weight gain or loss encouraged - optimal nutrition intake  promoted    Notes:       Goals Addressed             This Visit's Progress    [facility] - Find Help in My Community       Timeframe:  Short-Term Goal Priority:  Medium Start Date:                6/16             Expected End Date:       7/16                Follow Up Date 03/17/2021   Barriers: Financial    - follow-up on any referrals for help I am given    Why is this important?   Knowing how and where to find help for yourself or family in your neighborhood and community is an important skill.  You will want to take some steps to learn how.    Notes:   6/16 - Referral placed to pharmacy team for financial assistance with Xarelto and Colchicine      [facility] - Track and Manage Fluids and Swelling-Heart Failure       Timeframe:  Long-Range Goal Priority:  High Start Date:     6/16  Expected End Date:       9/16                Follow Up Date 03/17/2021   Barriers: Knowledge    - track weight in diary - use salt in moderation - weigh myself daily    Why is this important?   It is important to check your weight daily and watch how much salt and liquids you have.  It will help you to manage your heart failure.    Notes:   6/16 - Wife educated on low sodium diet as well as heart failure zones.  Will send HF education         Plan:  Follow-up: Patient agrees to Care Plan and Follow-up. Follow-up in 2 week(s). Will notify PCP of [facility] involvement.  [name], [place], MSN [place] [phone]

## 2021-03-06 NOTE — Patient Outreach (Signed)
[facility]) Care Management  03/06/2021  [name] [birth year] [id number]  Referral for medication assistance from [facility], RN sent to [place].  [facility] Management Assistant [phone]

## 2021-03-09 ENCOUNTER — Ambulatory Visit: Payer: Self-pay | Admitting: *Deleted

## 2021-03-17 ENCOUNTER — Other Ambulatory Visit: Payer: Self-pay | Admitting: *Deleted

## 2021-03-17 NOTE — Patient Outreach (Signed)
[facility] [facility]) Care Management  03/17/2021  [name] 23-Oct-1940 [id number]   Outgoing call placed to member/wife, state member is doing better.  He is still having some extremity swelling, discussed interventions to decrease.  He is monitoring weights daily, she is unsure where log is so unable to report specific number.  Denies any urgent concerns, encouraged to contact this care manager with questions.  Agrees to follow up within the next month.   Goals Addressed             This Visit's Progress    [facility] - Find Help in My Community   On track    Timeframe:  Short-Term Goal Priority:  Medium Start Date:                6/16             Expected End Date:       7/16                Follow Up Date 03/17/2021   Barriers: Financial    - follow-up on any referrals for help I am given    Why is this important?   Knowing how and where to find help for yourself or family in your neighborhood and community is an important skill.  You will want to take some steps to learn how.    Notes:   6/16 - Referral placed to pharmacy team for financial assistance with Xarelto and Colchicine  6/28 - Wife will follow up with pharmacy to confirm correct cost of medications and notify this care manager.  Will follow up with [place] team once information received      [facility] - Track and Manage Fluids and Swelling-Heart Failure   On track    Timeframe:  Long-Range Goal Priority:  High Start Date:     6/16                        Expected End Date:       9/16                Follow Up Date 03/17/2021   Barriers: Knowledge    - track weight in diary - use salt in moderation - weigh myself daily    Why is this important?   It is important to check your weight daily and watch how much salt and liquids you have.  It will help you to manage your heart failure.    Notes:   6/16 - Wife educated on low sodium diet as well as heart failure zones.  Will send HF  education  6/28 - Reminded wife to have member elevate legs when sitting to decrease swelling.  Discussed use of compression socks/stockings when awake        [name], RN, MSN [place] [phone]

## 2021-03-24 ENCOUNTER — Other Ambulatory Visit: Payer: Self-pay | Admitting: *Deleted

## 2021-03-24 NOTE — Patient Outreach (Signed)
[facility]) Care Management  03/24/2021  [name] 08/29/1941 [id number]   Voice message received from [employer] with [place] team regarding referral and contact with member's wife.  Call placed to member's wife, report she would like to stop [facility] involvement, state she was mislead with who the team was associated with.  [facility] care management again explained, including partnership with PCP office, [employer], and [place], wife continue to decline help.  Request for their names to be removed from call list.  Will close case at this time, encouraged to continue to use resources (calendar book, education, and 24 hour triage line).  Also advised to contact this care manager with any questions/changes in the future.  [name], [place], MSN [place] [phone]

## 2021-04-06 NOTE — Progress Notes (Signed)
Cardiology Office Note:    Date:  04/07/2021   ID:  [name], DOB 08-Jan-1941, MRN [id number]  PCP:  [name], NP   [facility] Providers Cardiologist:  [name], MD      Referring MD: [name], MD   Chief Complaint:  Hospitalization Follow-up (CHF, AF w RVR)    Patient Profile:    [name] is a 80 y.o. male with:  Permanent atrial fibrillation Pt has declined anticoagulation  Intol of Dilt due to leg edema Pt stopped Amiodarone due to leg pain Admx in 2021 with AF w RVR >> Rivaroxaban + rate control (pt declined DCCV) (HFpEF) heart failure with preserved ejection fraction  Dilated aorta (Echocardiogram 6/22: 38 mm) Chronic kidney disease  Carotid artery disease  Hypertension  Hyperlipidemia  COPD   Prior CV studies: Echocardiogram 02/27/21 EF 65-70, no RWMA, severe LVH, RVSP 40.7, mild BAE, trivial MR, trivial AI, mild-moderate AV sclerosis without aortic stenosis, dilated aorta (38 mm)  VAS US CAROTID DUPLEX BILATERAL 03/01/2020 Summary: Right Carotid: Velocities in the right ICA are consistent with a 1-39% stenosis. Left Carotid: There is no evidence of stenosis in the left ICA.  History of Present Illness: [name] was last seen by Dr. [name] in 5/22.  He was admitted 6/9-6/11 with decompensated HF c/b AF with RVR.  He developed AKI on chronic kidney disease with diuresis.  His thiazide diuretic and ACEi were both DCd.  He returns for follow-up.  He is here with his wife.  Since discharge in the hospital, his breathing remains improved.  He does have chronic shortness of breath with most activities.  He sleeps in a recliner and has done so for the past 4 years.  His lower extremity edema remains improved.  He has not had any significant weight increase since discharge from the hospital.  He notes his weights ranged between 200 to 204.  He has not had chest pain, syncope.  He has been dizzy at times when he stands.    Past Medical  History:  Diagnosis Date   Benign localized hyperplasia of prostate with urinary obstruction 07/21/2015   CAP (community acquired pneumonia) 04/14/2015   Carotid stenosis    left   CKD (chronic kidney disease) stage 4, GFR 15-29 ml/min (HCC) 7/82/9562   Complication of anesthesia    Essential hypertension 04/14/2015   Gram-negative bacteremia 04/15/2015   Headache    migraines   Hearing loss    Hematuria 04/19/2015   HLD (hyperlipidemia)    Normocytic hypochromic anemia 04/14/2015   Permanent atrial fibrillation ([name]) 04/14/2015   PONV (postoperative nausea and vomiting)    Sepsis secondary to UTI ([name]) 04/23/2015   Transfusion history    last 04-24-15   Urinary retention     Current Medications: Current Meds  Medication Sig   Acetaminophen (TYLENOL) 325 MG CAPS Take 325 mg by mouth daily as needed (For Pain).   colchicine 0.6 MG tablet Take 1 tablet (0.6 mg total) by mouth daily.   furosemide (LASIX) 20 MG tablet Take 1 tablet (20 mg total) by mouth daily.   metoprolol succinate (TOPROL XL) 100 MG 24 hr tablet Take 1 tablet (100 mg total) by mouth daily. Take with 50mg  tablet daily for total dose 150mg  daily   pantoprazole (PROTONIX) 40 MG tablet Take 1 tablet (40 mg total) by mouth daily.   potassium chloride (KLOR-CON) 10 MEQ tablet Take 1 tablet (10 mEq total) by mouth daily.  Rivaroxaban (XARELTO) 15 MG TABS tablet Take 1 tablet (15 mg total) by mouth daily.   [DISCONTINUED] metoprolol succinate (TOPROL XL) 50 MG 24 hr tablet Take 1 tablet (50 mg total) by mouth daily. Take with or immediately following a meal. Take with 100mg  tablet daily for total dose 150mg  daily     Allergies:   Patient has no known allergies.   Social History   Tobacco Use   Smoking status: Never   Smokeless tobacco: Never  Substance Use Topics   Alcohol use: No   Drug use: No     Family Hx: The patient's family history includes Breast cancer in his mother.  Review of Systems  Respiratory:   Positive for cough (chronic, dry).   Gastrointestinal:  Negative for hematochezia.  Genitourinary:  Negative for hematuria.    EKGs/Labs/Other Test Reviewed:    EKG:  EKG is not ordered today.  The ekg ordered today demonstrates n/a  Recent Labs: 02/26/2021: ALT 54; B Natriuretic Peptide 1,120.7 02/27/2021: TSH 0.902 02/28/2021: BUN 36; Creatinine, Ser 1.99; Hemoglobin 9.6; Magnesium 2.0; Platelets 240; Potassium 3.2; Sodium 139   Recent Lipid Panel Lab Results  Component Value Date/Time   CHOL 136 04/15/2015 03:55 AM   TRIG 134 04/15/2015 03:55 AM   HDL 23 (L) 04/15/2015 03:55 AM   LDLCALC 86 04/15/2015 03:55 AM      Risk Assessment/Calculations:    CHA2DS2-VASc Score = 5  This indicates a 7.2% annual risk of stroke. The patient's score is based upon: CHF History: Yes HTN History: Yes Diabetes History: No Stroke History: No Vascular Disease History: Yes Age Score: 2 Gender Score: 0     Creatinine clearance: 41 mL/min   Physical Exam:    VS:  BP 116/70   Pulse 74   Ht 5\' 6"  (1.676 m)   Wt 204 lb 6.4 oz (92.7 kg)   SpO2 95%   BMI 32.99 kg/m     Wt Readings from Last 3 Encounters:  04/07/21 204 lb 6.4 oz (92.7 kg)  02/28/21 192 lb 10.9 oz (87.4 kg)  02/02/21 206 lb 3.2 oz (93.5 kg)     Constitutional:      Appearance: Healthy appearance. Not in distress.  Neck:     Vascular: JVD normal.  Pulmonary:     Effort: Pulmonary effort is normal.     Breath sounds: No wheezing. No rales.  Cardiovascular:     Normal rate. Irregularly irregular rhythm. Normal S1. Normal S2.      Murmurs: There is no murmur.  Edema:    Peripheral edema present.    Pretibial: bilateral 1+ edema of the pretibial area.    Ankle: bilateral 1+ edema of the ankle.    Comments: tight Abdominal:     General: There is no distension.     Palpations: Abdomen is soft.  Skin:    General: Skin is warm and dry.  Neurological:     General: No focal deficit present.     Mental Status:  Alert and oriented to person, place and time.     Cranial Nerves: Cranial nerves are intact.        ASSESSMENT & PLAN:    1. Chronic heart failure with preserved ejection fraction [facility]) He had a recent admission to the hospital with volume overload.  EF was 65-70 by echocardiogram.  He has some evidence of continued volume excess on exam.  However, his weights have been stable since discharge and his breathing is also remained stable.  He has had some dizziness which may be related to low blood pressure.  He has a chronic dry cough.  Of note, his ACE inhibitor was stopped in the hospital as well as HCTZ given worsening creatinine.  I recommend continuing current dose of furosemide.  I will obtain a follow-up BMET, CBC today.  We discussed the importance of daily weights and when to take extra furosemide.  Follow-up in 3 months.  2. Permanent atrial fibrillation (HCC) Overall, rate seems to be well controlled.  He is tolerating anticoagulation.  He is on the appropriate dose of Rivaroxaban.  Continue current dose of rivaroxaban.  He has been somewhat lightheaded at times with standing.  I will decrease his metoprolol succinate to 125 mg daily.  3. Dilation of aorta (HCC) 38 mm by recent echocardiogram.  Consider repeat echo in 1 year.  4. CKD (chronic kidney disease) stage 4, GFR 15-29 ml/min (HCC) Obtain follow-up BMET today.  5. Essential hypertension Blood pressure running somewhat low.  Adjust metoprolol succinate as noted.  6. Normocytic anemia Likely related to chronic kidney disease.  Obtain follow-up CBC today.      Dispo:  Return in about 3 months (around 07/08/2021) for Routine Follow Up w/ Dr. [name].   Medication Adjustments/Labs and Tests Ordered: Current medicines are reviewed at length with the patient today.  Concerns regarding medicines are outlined above.  Tests Ordered: Orders Placed This Encounter  Procedures   Basic Metabolic Panel (BMET)   CBC   Medication  Changes: Meds ordered this encounter  Medications   metoprolol succinate (TOPROL XL) 50 MG 24 hr tablet    Sig: Take 0.5 tablets (25 mg total) by mouth daily. Take with or immediately following a meal. Take with 100mg  tablet daily for total dose 125mg  daily    Dispense:  15 tablet    Refill:  2    Signed, Richardson Dopp, PA-C  04/07/2021 12:34 PM    [facility] [place], [place], [place]  [address] Phone: [phone]; Fax: [phone]

## 2021-04-07 ENCOUNTER — Encounter: Payer: Self-pay | Admitting: Physician Assistant

## 2021-04-07 ENCOUNTER — Ambulatory Visit: Payer: Medicare Other | Admitting: Physician Assistant

## 2021-04-07 ENCOUNTER — Other Ambulatory Visit: Payer: Self-pay

## 2021-04-07 VITALS — BP 116/70 | HR 74 | Ht 66.0 in | Wt 204.4 lb

## 2021-04-07 DIAGNOSIS — I77819 Aortic ectasia, unspecified site: Secondary | ICD-10-CM | POA: Diagnosis not present

## 2021-04-07 DIAGNOSIS — N184 Chronic kidney disease, stage 4 (severe): Secondary | ICD-10-CM | POA: Diagnosis not present

## 2021-04-07 DIAGNOSIS — I5032 Chronic diastolic (congestive) heart failure: Secondary | ICD-10-CM

## 2021-04-07 DIAGNOSIS — I10 Essential (primary) hypertension: Secondary | ICD-10-CM | POA: Diagnosis not present

## 2021-04-07 DIAGNOSIS — I4821 Permanent atrial fibrillation: Secondary | ICD-10-CM

## 2021-04-07 DIAGNOSIS — D649 Anemia, unspecified: Secondary | ICD-10-CM | POA: Diagnosis not present

## 2021-04-07 DIAGNOSIS — E7800 Pure hypercholesterolemia, unspecified: Secondary | ICD-10-CM

## 2021-04-07 MED ORDER — METOPROLOL SUCCINATE ER 50 MG PO TB24: 25.0000 mg | ORAL_TABLET | Freq: Every day | ORAL | 2 refills | Status: DC

## 2021-04-07 NOTE — Patient Instructions (Signed)
Medication Instructions:   DECREASE  Toprol XL one half tablet by mouth ( 25 mg) daily to be taken with your one tablet by mouth ( 100 mg) daily to = 125 mg daily.  *If you need a refill on your cardiac medications before your next appointment, please call your pharmacy*   Lab Work:  TODAY!!!!!!  BMET/CBC  If you have labs (blood work) drawn today and your tests are completely normal, you will receive your results only by: [place] (if you have MyChart) OR A paper copy in the mail If you have any lab test that is abnormal or we need to change your treatment, we will call you to review the results.   Testing/Procedures:  -NONE  Follow-Up: At [facility], you and your health needs are our priority.  As part of our continuing mission to provide you with exceptional heart care, we have created designated Provider Care Teams.  These Care Teams include your primary Cardiologist (physician) and Advanced Practice Providers (APPs -  Physician Assistants and Nurse Practitioners) who all work together to provide you with the care you need, when you need it.  We recommend signing up for the patient portal called "MyChart".  Sign up information is provided on this After Visit Summary.  MyChart is used to connect with patients for Virtual Visits (Telemedicine).  Patients are able to view lab/test results, encounter notes, upcoming appointments, etc.  Non-urgent messages can be sent to your provider as well.   To learn more about what you can do with MyChart, go to [email or web address].    Your next appointment:   3 month(s) with Dr. [name] on Tuesday, November 1 @ 11:20 am.   The format for your next appointment:   In Person  Provider:   [name], MD   Other Instructions

## 2021-04-08 LAB — CBC
Hematocrit: 31.5 % — ABNORMAL LOW (ref 37.5–51.0)
Hemoglobin: 9.4 g/dL — ABNORMAL LOW (ref 13.0–17.7)
MCH: 25.3 pg — ABNORMAL LOW (ref 26.6–33.0)
MCHC: 29.8 g/dL — ABNORMAL LOW (ref 31.5–35.7)
MCV: 85 fL (ref 79–97)
Platelets: 243 10*3/uL (ref 150–450)
RBC: 3.71 x10E6/uL — ABNORMAL LOW (ref 4.14–5.80)
RDW: 18 % — ABNORMAL HIGH (ref 11.6–15.4)
WBC: 5.9 10*3/uL (ref 3.4–10.8)

## 2021-04-08 LAB — BASIC METABOLIC PANEL
BUN/Creatinine Ratio: 15 (ref 10–24)
BUN: 27 mg/dL (ref 8–27)
CO2: 25 mmol/L (ref 20–29)
Calcium: 9.1 mg/dL (ref 8.6–10.2)
Chloride: 103 mmol/L (ref 96–106)
Creatinine, Ser: 1.77 mg/dL — ABNORMAL HIGH (ref 0.76–1.27)
Glucose: 73 mg/dL (ref 65–99)
Potassium: 3.8 mmol/L (ref 3.5–5.2)
Sodium: 144 mmol/L (ref 134–144)
eGFR: 39 mL/min/{1.73_m2} — ABNORMAL LOW (ref >59)

## 2021-04-21 DIAGNOSIS — R0602 Shortness of breath: Secondary | ICD-10-CM | POA: Diagnosis not present

## 2021-04-21 DIAGNOSIS — L03115 Cellulitis of right lower limb: Secondary | ICD-10-CM | POA: Diagnosis not present

## 2021-04-21 DIAGNOSIS — R600 Localized edema: Secondary | ICD-10-CM | POA: Diagnosis not present

## 2021-04-21 DIAGNOSIS — R531 Weakness: Secondary | ICD-10-CM | POA: Diagnosis not present

## 2021-05-05 DIAGNOSIS — S81812A Laceration without foreign body, left lower leg, initial encounter: Secondary | ICD-10-CM | POA: Diagnosis not present

## 2021-05-05 DIAGNOSIS — L03115 Cellulitis of right lower limb: Secondary | ICD-10-CM | POA: Diagnosis not present

## 2021-05-05 DIAGNOSIS — R531 Weakness: Secondary | ICD-10-CM | POA: Diagnosis not present

## 2021-05-05 DIAGNOSIS — R0602 Shortness of breath: Secondary | ICD-10-CM | POA: Diagnosis not present

## 2021-05-05 DIAGNOSIS — R600 Localized edema: Secondary | ICD-10-CM | POA: Diagnosis not present

## 2021-05-13 ENCOUNTER — Telehealth: Payer: Self-pay | Admitting: Student

## 2021-05-13 NOTE — Telephone Encounter (Signed)
Spoke with patient's wife Inez Catalina, regarding the Palliative referral/services and wife stated that patient has declined Palliative services at this time.  She was in agreement with [place] cancelling the referral.

## 2021-06-17 DIAGNOSIS — D649 Anemia, unspecified: Secondary | ICD-10-CM | POA: Diagnosis not present

## 2021-06-17 DIAGNOSIS — D509 Iron deficiency anemia, unspecified: Secondary | ICD-10-CM | POA: Diagnosis not present

## 2021-06-17 DIAGNOSIS — R739 Hyperglycemia, unspecified: Secondary | ICD-10-CM | POA: Diagnosis not present

## 2021-06-17 DIAGNOSIS — N183 Chronic kidney disease, stage 3 unspecified: Secondary | ICD-10-CM | POA: Diagnosis not present

## 2021-06-17 DIAGNOSIS — N189 Chronic kidney disease, unspecified: Secondary | ICD-10-CM | POA: Diagnosis not present

## 2021-06-17 DIAGNOSIS — E782 Mixed hyperlipidemia: Secondary | ICD-10-CM | POA: Diagnosis not present

## 2021-06-17 DIAGNOSIS — I10 Essential (primary) hypertension: Secondary | ICD-10-CM | POA: Diagnosis not present

## 2021-06-17 DIAGNOSIS — Z0000 Encounter for general adult medical examination without abnormal findings: Secondary | ICD-10-CM | POA: Diagnosis not present

## 2021-06-17 DIAGNOSIS — E119 Type 2 diabetes mellitus without complications: Secondary | ICD-10-CM | POA: Diagnosis not present

## 2021-06-24 DIAGNOSIS — E782 Mixed hyperlipidemia: Secondary | ICD-10-CM | POA: Diagnosis not present

## 2021-06-24 DIAGNOSIS — N183 Chronic kidney disease, stage 3 unspecified: Secondary | ICD-10-CM | POA: Diagnosis not present

## 2021-06-24 DIAGNOSIS — N320 Bladder-neck obstruction: Secondary | ICD-10-CM | POA: Diagnosis not present

## 2021-06-24 DIAGNOSIS — M1A9XX0 Chronic gout, unspecified, without tophus (tophi): Secondary | ICD-10-CM | POA: Diagnosis not present

## 2021-06-24 DIAGNOSIS — Z0000 Encounter for general adult medical examination without abnormal findings: Secondary | ICD-10-CM | POA: Diagnosis not present

## 2021-06-24 DIAGNOSIS — N2581 Secondary hyperparathyroidism of renal origin: Secondary | ICD-10-CM | POA: Diagnosis not present

## 2021-06-24 DIAGNOSIS — I4891 Unspecified atrial fibrillation: Secondary | ICD-10-CM | POA: Diagnosis not present

## 2021-06-24 DIAGNOSIS — D509 Iron deficiency anemia, unspecified: Secondary | ICD-10-CM | POA: Diagnosis not present

## 2021-06-24 DIAGNOSIS — K219 Gastro-esophageal reflux disease without esophagitis: Secondary | ICD-10-CM | POA: Diagnosis not present

## 2021-06-24 DIAGNOSIS — E119 Type 2 diabetes mellitus without complications: Secondary | ICD-10-CM | POA: Diagnosis not present

## 2021-06-24 DIAGNOSIS — I129 Hypertensive chronic kidney disease with stage 1 through stage 4 chronic kidney disease, or unspecified chronic kidney disease: Secondary | ICD-10-CM | POA: Diagnosis not present

## 2021-06-24 DIAGNOSIS — I10 Essential (primary) hypertension: Secondary | ICD-10-CM | POA: Diagnosis not present

## 2021-07-21 ENCOUNTER — Other Ambulatory Visit: Payer: Self-pay

## 2021-07-21 ENCOUNTER — Ambulatory Visit: Payer: Medicare Other | Admitting: Cardiology

## 2021-07-21 ENCOUNTER — Encounter: Payer: Self-pay | Admitting: Cardiology

## 2021-07-21 VITALS — BP 120/68 | HR 63 | Ht 66.0 in | Wt 200.8 lb

## 2021-07-21 DIAGNOSIS — I10 Essential (primary) hypertension: Secondary | ICD-10-CM | POA: Diagnosis not present

## 2021-07-21 DIAGNOSIS — I6521 Occlusion and stenosis of right carotid artery: Secondary | ICD-10-CM

## 2021-07-21 DIAGNOSIS — I4821 Permanent atrial fibrillation: Secondary | ICD-10-CM | POA: Diagnosis not present

## 2021-07-21 DIAGNOSIS — E7800 Pure hypercholesterolemia, unspecified: Secondary | ICD-10-CM

## 2021-07-21 DIAGNOSIS — R600 Localized edema: Secondary | ICD-10-CM

## 2021-07-21 NOTE — Patient Instructions (Addendum)
Medication Instructions:  Your physician recommends that you continue on your current medications as directed. Please refer to the Current Medication list given to you today.  *If you need a refill on your cardiac medications before your next appointment, please call your pharmacy*   Lab Work: TODAY: CBC and BMET  If you have labs (blood work) drawn today and your tests are completely normal, you will receive your results only by: [place] (if you have MyChart) OR A paper copy in the mail If you have any lab test that is abnormal or we need to change your treatment, we will call you to review the results.   Follow-Up: At [facility], you and your health needs are our priority.  As part of our continuing mission to provide you with exceptional heart care, we have created designated Provider Care Teams.  These Care Teams include your primary Cardiologist (physician) and Advanced Practice Providers (APPs -  Physician Assistants and Nurse Practitioners) who all work together to provide you with the care you need, when you need it.  Your next appointment:   6 month(s)  The format for your next appointment:   In Person  Provider:   You may see [name], MD or one of the following Advanced Practice Providers on your designated Care Team:   Melina Copa, PA-C Ermalinda Barrios, PA-C   Low-Sodium Eating Plan Sodium, which is an element that makes up salt, helps you maintain a healthy balance of fluids in your body. Too much sodium can increase your blood pressure and cause fluid and waste to be held in your body. Your health care provider or dietitian may recommend following this plan if you have high blood pressure (hypertension), kidney disease, liver disease, or heart failure. Eating less sodium can help lower your blood pressure, reduce swelling, and protect your heart, liver, and kidneys. What are tips for following this plan? Reading food labels The Nutrition Facts label lists  the amount of sodium in one serving of the food. If you eat more than one serving, you must multiply the listed amount of sodium by the number of servings. Choose foods with less than 140 mg of sodium per serving. Avoid foods with 300 mg of sodium or more per serving. Shopping  Look for lower-sodium products, often labeled as "low-sodium" or "no salt added." Always check the sodium content, even if foods are labeled as "unsalted" or "no salt added." Buy fresh foods. Avoid canned foods and pre-made or frozen meals. Avoid canned, cured, or processed meats. Buy breads that have less than 80 mg of sodium per slice. Cooking  Eat more home-cooked food and less restaurant, buffet, and fast food. Avoid adding salt when cooking. Use salt-free seasonings or herbs instead of table salt or sea salt. Check with your health care provider or pharmacist before using salt substitutes. Cook with plant-based oils, such as canola, sunflower, or olive oil. Meal planning When eating at a restaurant, ask that your food be prepared with less salt or no salt, if possible. Avoid dishes labeled as brined, pickled, cured, smoked, or made with soy sauce, miso, or teriyaki sauce. Avoid foods that contain MSG (monosodium glutamate). MSG is sometimes added to [place] food, bouillon, and some canned foods. Make meals that can be grilled, baked, poached, roasted, or steamed. These are generally made with less sodium. General information Most people on this plan should limit their sodium intake to 1,500-2,000 mg (milligrams) of sodium each day. What foods should I eat?  Fruits Fresh, frozen, or canned fruit. Fruit juice. Vegetables Fresh or frozen vegetables. "No salt added" canned vegetables. "No salt added" tomato sauce and paste. Low-sodium or reduced-sodium tomato and vegetable juice. Grains Low-sodium cereals, including oats, puffed wheat and rice, and shredded wheat. Low-sodium crackers. Unsalted rice. Unsalted pasta.  Low-sodium bread. Whole-grain breads and whole-grain pasta. Meats and other proteins Fresh or frozen (no salt added) meat, poultry, seafood, and fish. Low-sodium canned tuna and salmon. Unsalted nuts. Dried peas, beans, and lentils without added salt. Unsalted canned beans. Eggs. Unsalted nut butters. Dairy Milk. Soy milk. Cheese that is naturally low in sodium, such as ricotta cheese, fresh mozzarella, or Swiss cheese. Low-sodium or reduced-sodium cheese. Cream cheese. Yogurt. Seasonings and condiments Fresh and dried herbs and spices. Salt-free seasonings. Low-sodium mustard and ketchup. Sodium-free salad dressing. Sodium-free light mayonnaise. Fresh or refrigerated horseradish. Lemon juice. Vinegar. Other foods Homemade, reduced-sodium, or low-sodium soups. Unsalted popcorn and pretzels. Low-salt or salt-free chips. The items listed above may not be a complete list of foods and beverages you can eat. Contact a dietitian for more information. What foods should I avoid? Vegetables Sauerkraut, pickled vegetables, and relishes. Olives. [place] fries. Onion rings. Regular canned vegetables (not low-sodium or reduced-sodium). Regular canned tomato sauce and paste (not low-sodium or reduced-sodium). Regular tomato and vegetable juice (not low-sodium or reduced-sodium). Frozen vegetables in sauces. Grains Instant hot cereals. Bread stuffing, pancake, and biscuit mixes. Croutons. Seasoned rice or pasta mixes. Noodle soup cups. Boxed or frozen macaroni and cheese. Regular salted crackers. Self-rising flour. Meats and other proteins Meat or fish that is salted, canned, smoked, spiced, or pickled. Precooked or cured meat, such as sausages or meat loaves. [place] Salines. Ham. Pepperoni. Hot dogs. Corned beef. Chipped beef. Salt pork. Jerky. Pickled herring. Anchovies and sardines. Regular canned tuna. Salted nuts. Dairy Processed cheese and cheese spreads. Hard cheeses. Cheese curds. Blue cheese. Feta cheese. String  cheese. Regular cottage cheese. Buttermilk. Canned milk. Fats and oils Salted butter. Regular margarine. Ghee. Bacon fat. Seasonings and condiments Onion salt, garlic salt, seasoned salt, table salt, and sea salt. Canned and packaged gravies. Worcestershire sauce. Tartar sauce. Barbecue sauce. Teriyaki sauce. Soy sauce, including reduced-sodium. Steak sauce. Fish sauce. Oyster sauce. Cocktail sauce. Horseradish that you find on the shelf. Regular ketchup and mustard. Meat flavorings and tenderizers. Bouillon cubes. Hot sauce. Pre-made or packaged marinades. Pre-made or packaged taco seasonings. Relishes. Regular salad dressings. Salsa. Other foods Salted popcorn and pretzels. Corn chips and puffs. Potato and tortilla chips. Canned or dried soups. Pizza. Frozen entrees and pot pies. The items listed above may not be a complete list of foods and beverages you should avoid. Contact a dietitian for more information. Summary Eating less sodium can help lower your blood pressure, reduce swelling, and protect your heart, liver, and kidneys. Most people on this plan should limit their sodium intake to 1,500-2,000 mg (milligrams) of sodium each day. Canned, boxed, and frozen foods are high in sodium. Restaurant foods, fast foods, and pizza are also very high in sodium. You also get sodium by adding salt to food. Try to cook at home, eat more fresh fruits and vegetables, and eat less fast food and canned, processed, or prepared foods. This information is not intended to replace advice given to you by your health care provider. Make sure you discuss any questions you have with your health care provider. Document Revised: 10/12/2019 Document Reviewed: 08/08/2019 Elsevier Patient Education  2022 [employer].
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
## 2021-07-21 NOTE — Addendum Note (Signed)
Addended by: Antonieta Iba on: 07/21/2021 12:16 PM   Modules accepted: Orders

## 2021-07-22 LAB — CBC
Hematocrit: 35.3 % — ABNORMAL LOW (ref 37.5–51.0)
Hemoglobin: 11.2 g/dL — ABNORMAL LOW (ref 13.0–17.7)
MCH: 27.4 pg (ref 26.6–33.0)
MCHC: 31.7 g/dL (ref 31.5–35.7)
MCV: 86 fL (ref 79–97)
Platelets: 225 10*3/uL (ref 150–450)
RBC: 4.09 x10E6/uL — ABNORMAL LOW (ref 4.14–5.80)
RDW: 19.2 % — ABNORMAL HIGH (ref 11.6–15.4)
WBC: 5 10*3/uL (ref 3.4–10.8)

## 2021-07-22 LAB — BASIC METABOLIC PANEL
BUN/Creatinine Ratio: 17 (ref 10–24)
BUN: 32 mg/dL — ABNORMAL HIGH (ref 8–27)
CO2: 27 mmol/L (ref 20–29)
Calcium: 9.1 mg/dL (ref 8.6–10.2)
Chloride: 100 mmol/L (ref 96–106)
Creatinine, Ser: 1.93 mg/dL — ABNORMAL HIGH (ref 0.76–1.27)
Glucose: 106 mg/dL — ABNORMAL HIGH (ref 70–99)
Potassium: 4.2 mmol/L (ref 3.5–5.2)
Sodium: 143 mmol/L (ref 134–144)
eGFR: 35 mL/min/{1.73_m2} — ABNORMAL LOW (ref >59)

## 2021-07-23 ENCOUNTER — Other Ambulatory Visit: Payer: Self-pay

## 2021-07-23 MED ORDER — METOPROLOL SUCCINATE ER 50 MG PO TB24: 25.0000 mg | ORAL_TABLET | Freq: Every day | ORAL | 3 refills | Status: DC

## 2021-07-29 ENCOUNTER — Other Ambulatory Visit: Payer: Self-pay

## 2021-07-29 MED ORDER — METOPROLOL SUCCINATE ER 50 MG PO TB24: 25.0000 mg | ORAL_TABLET | Freq: Every day | ORAL | 3 refills | Status: DC

## 2021-07-29 NOTE — Telephone Encounter (Signed)
Pt's medications were sent to pt's pharmacy as requested. Confirmation received.  

## 2021-09-05 IMAGING — CR DG CHEST 2V
2 series · 2 of 2 positions shown · non-contrast
Comparison: February 24, 2021.

CLINICAL DATA: Shortness of breath.

EXAM:
CHEST - 2 VIEW

[chest pa]
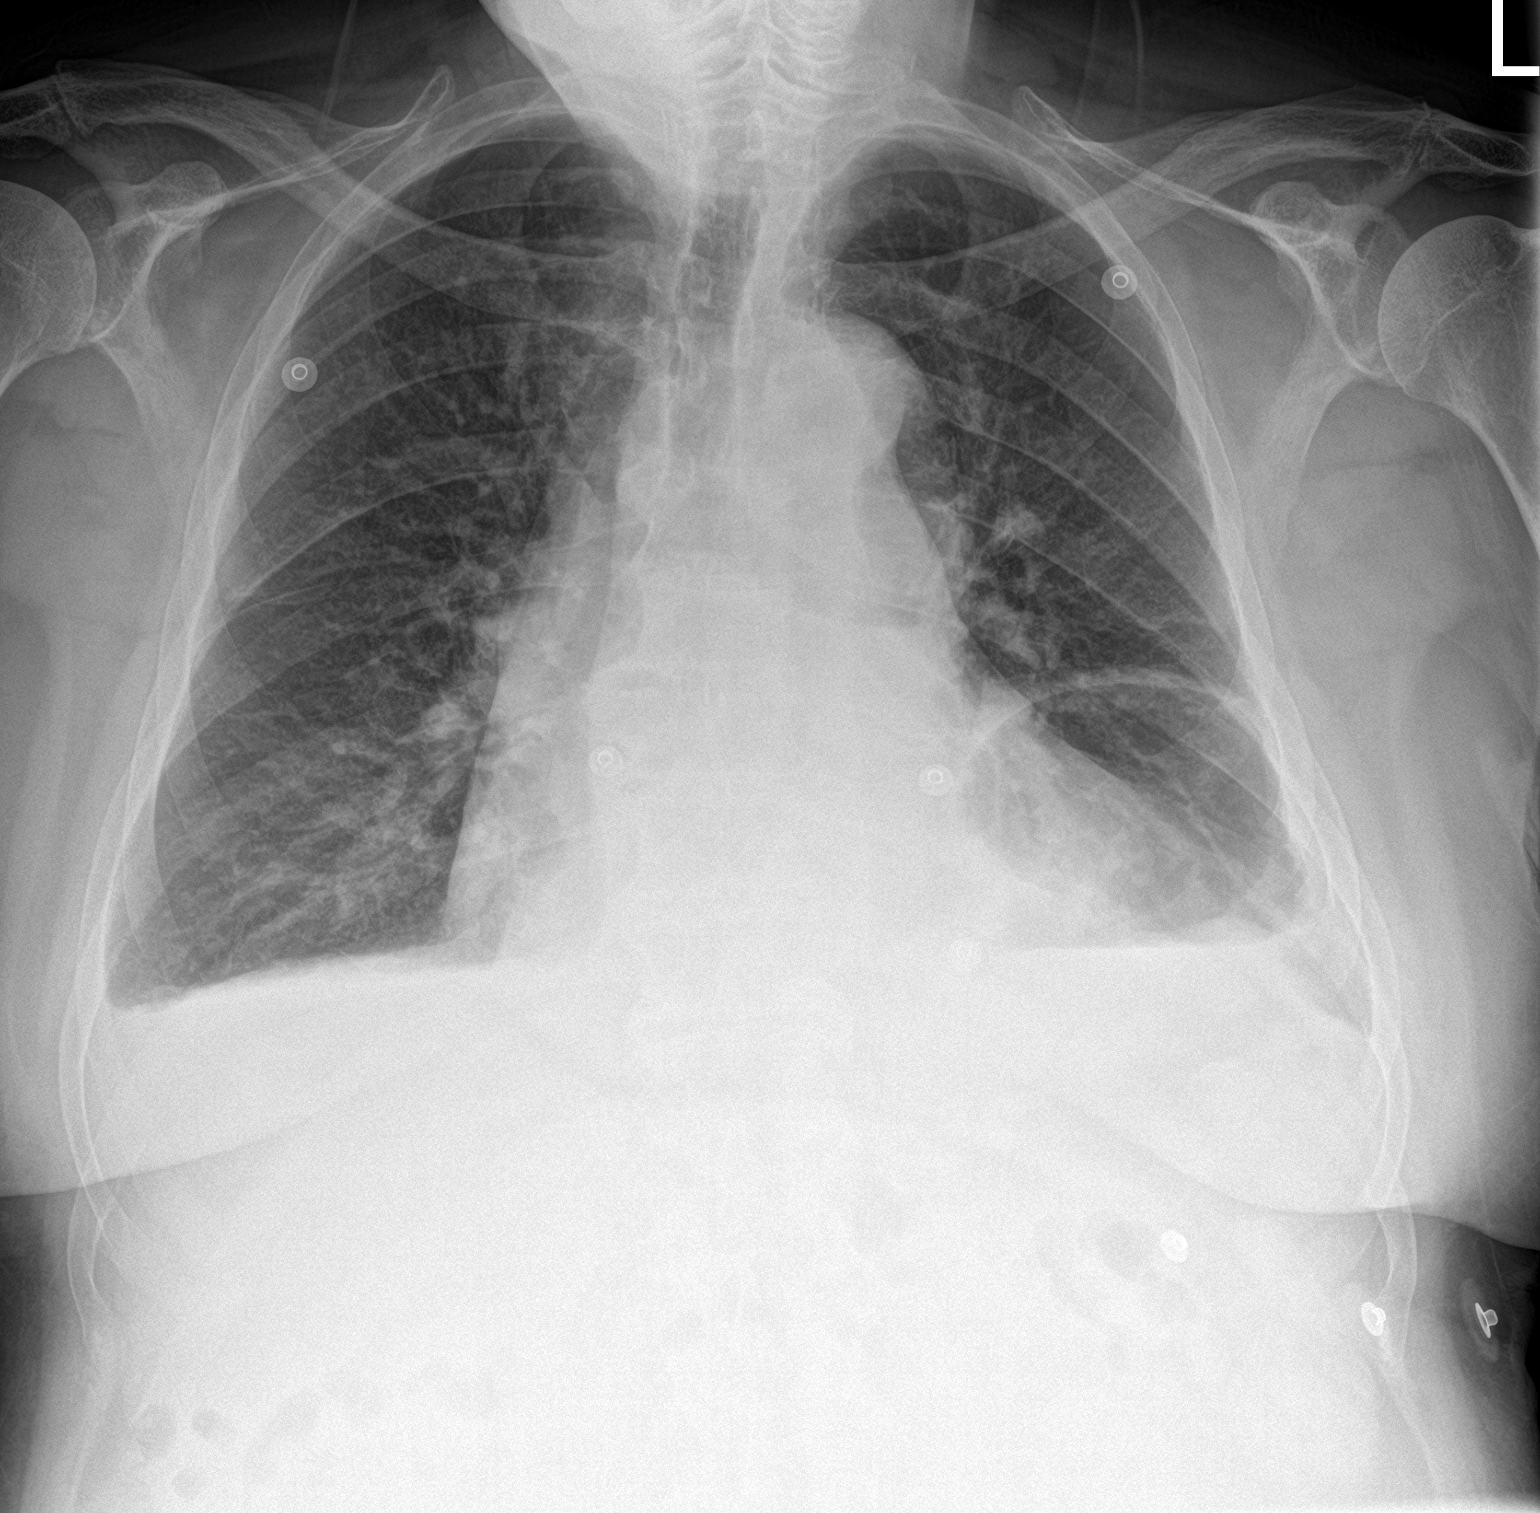

[chest lat]
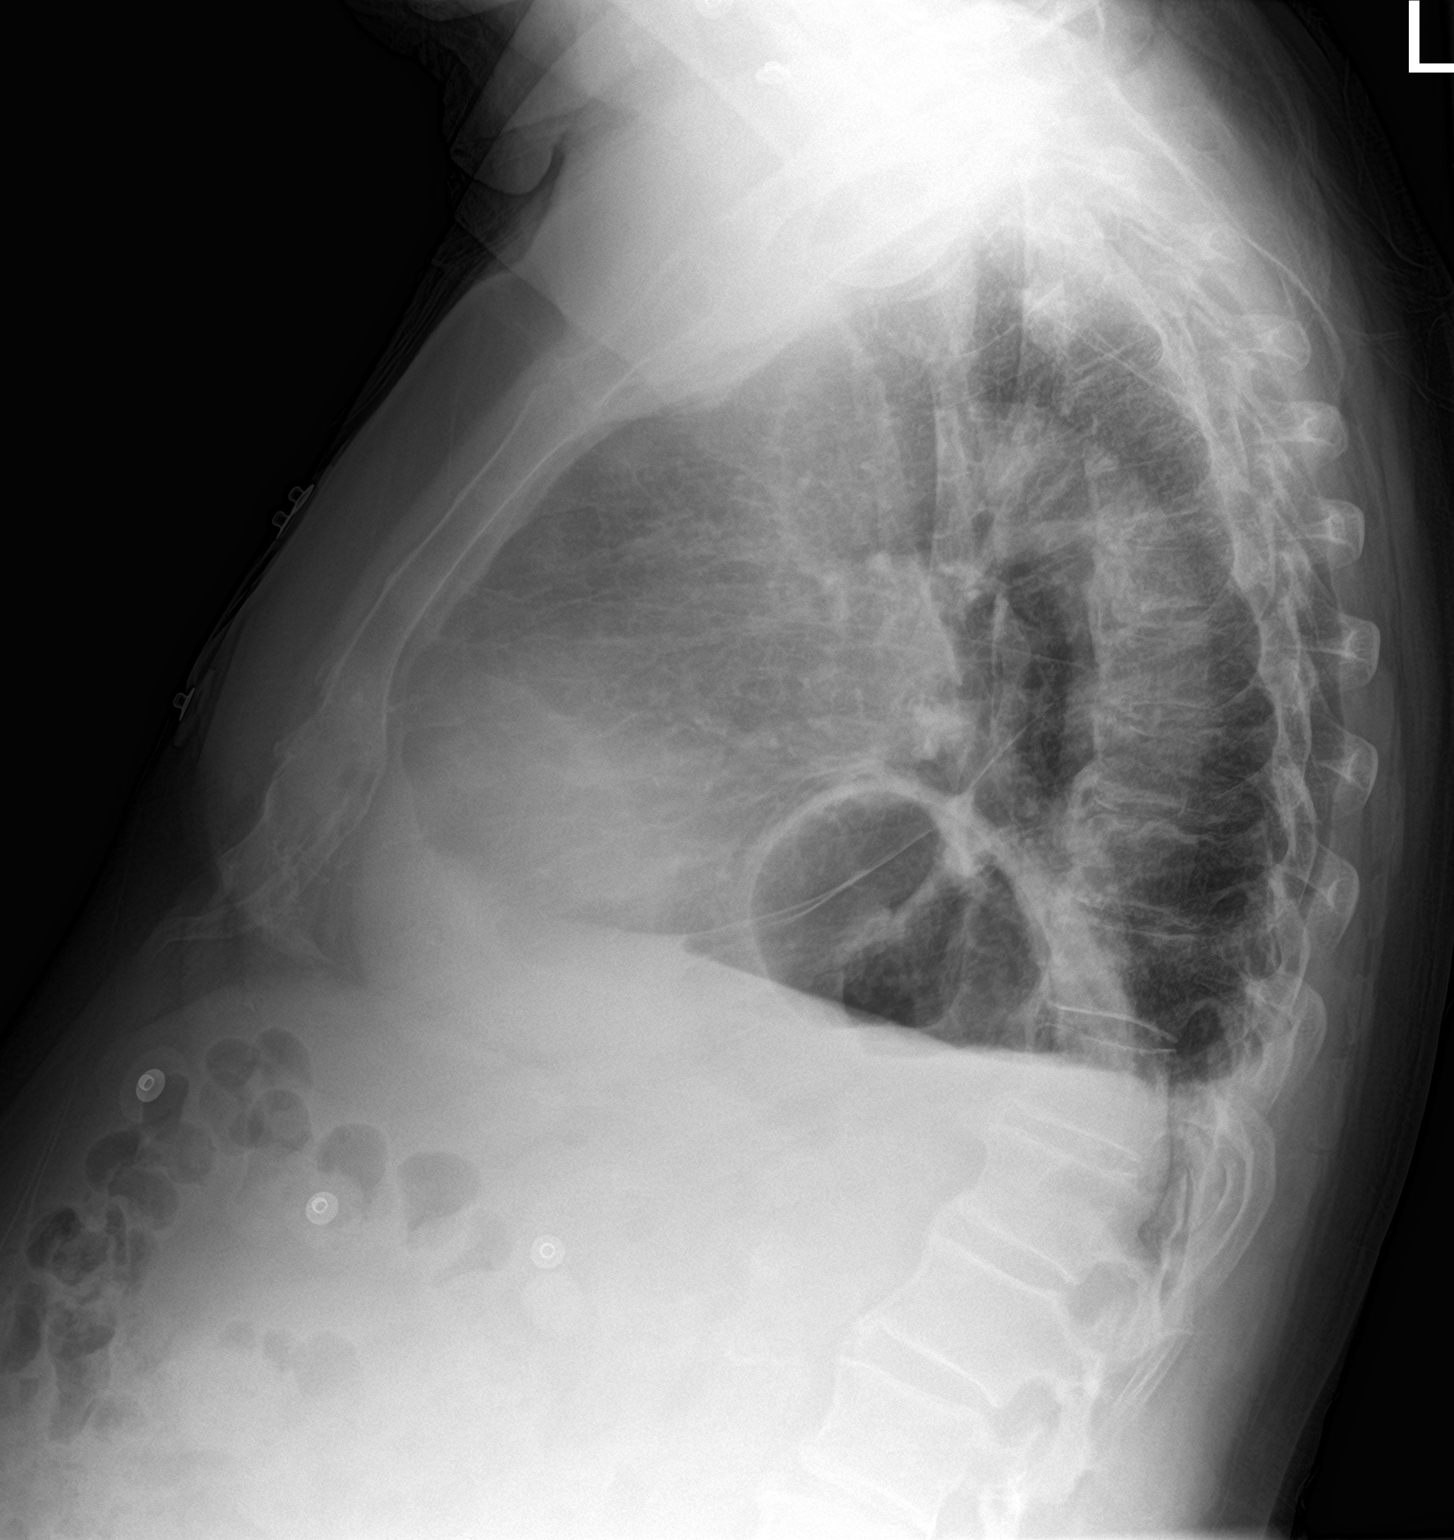

[2 of 2 positions shown; findings below may reference images not displayed]

FINDINGS: Stable cardiomediastinal silhouette. No pneumothorax is noted.
Hiatal hernia is again noted. Mild bibasilar subsegmental
atelectasis or edema is noted with small pleural effusions. Bony
thorax is unremarkable.
IMPRESSION: Mild bibasilar subsegmental atelectasis or edema is noted with small
pleural effusions. Hiatal hernia is again noted.

## 2022-04-05 ENCOUNTER — Other Ambulatory Visit: Payer: Self-pay | Admitting: Cardiology

## 2022-05-04 ENCOUNTER — Ambulatory Visit: Payer: Medicare Other | Admitting: Physician Assistant
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
## 2022-05-17 ENCOUNTER — Ambulatory Visit: Payer: Medicare Other | Attending: Physician Assistant | Admitting: Physician Assistant

## 2022-05-17 ENCOUNTER — Encounter: Payer: Self-pay | Admitting: Physician Assistant

## 2022-05-17 VITALS — BP 140/80 | HR 66 | Ht 64.0 in | Wt 203.6 lb

## 2022-05-17 DIAGNOSIS — N184 Chronic kidney disease, stage 4 (severe): Secondary | ICD-10-CM | POA: Diagnosis not present

## 2022-05-17 DIAGNOSIS — I6523 Occlusion and stenosis of bilateral carotid arteries: Secondary | ICD-10-CM

## 2022-05-17 DIAGNOSIS — D649 Anemia, unspecified: Secondary | ICD-10-CM | POA: Diagnosis not present

## 2022-05-17 DIAGNOSIS — I77819 Aortic ectasia, unspecified site: Secondary | ICD-10-CM

## 2022-05-17 DIAGNOSIS — I4821 Permanent atrial fibrillation: Secondary | ICD-10-CM

## 2022-05-17 DIAGNOSIS — I5032 Chronic diastolic (congestive) heart failure: Secondary | ICD-10-CM

## 2022-05-17 MED ORDER — METOPROLOL SUCCINATE ER 25 MG PO TB24: 25.0000 mg | ORAL_TABLET | Freq: Every day | ORAL | 3 refills | Status: DC

## 2022-05-17 NOTE — Patient Instructions (Signed)
Medication Instructions:  Your physician recommends that you continue on your current medications as directed. Please refer to the Current Medication list given to you today.  *If you need a refill on your cardiac medications before your next appointment, please call your pharmacy*   Lab Work: None If you have labs (blood work) drawn today and your tests are completely normal, you will receive your results only by: [place] (if you have MyChart) OR A paper copy in the mail If you have any lab test that is abnormal or we need to change your treatment, we will call you to review the results.   Follow-Up: At [facility], you and your health needs are our priority.  As part of our continuing mission to provide you with exceptional heart care, we have created designated Provider Care Teams.  These Care Teams include your primary Cardiologist (physician) and Advanced Practice Providers (APPs -  Physician Assistants and Nurse Practitioners) who all work together to provide you with the care you need, when you need it.  We recommend signing up for the patient portal called "MyChart".  Sign up information is provided on this After Visit Summary.  MyChart is used to connect with patients for Virtual Visits (Telemedicine).  Patients are able to view lab/test results, encounter notes, upcoming appointments, etc.  Non-urgent messages can be sent to your provider as well.   To learn more about what you can do with MyChart, go to [email or web address].    Your next appointment:   1 year(s)  The format for your next appointment:   In Person  Provider:   [name], MD    Important Information About Sugar

## 2022-06-24 DIAGNOSIS — E782 Mixed hyperlipidemia: Secondary | ICD-10-CM | POA: Diagnosis not present

## 2022-06-24 DIAGNOSIS — I10 Essential (primary) hypertension: Secondary | ICD-10-CM | POA: Diagnosis not present

## 2022-06-24 DIAGNOSIS — E119 Type 2 diabetes mellitus without complications: Secondary | ICD-10-CM | POA: Diagnosis not present

## 2022-06-24 DIAGNOSIS — D509 Iron deficiency anemia, unspecified: Secondary | ICD-10-CM | POA: Diagnosis not present

## 2022-07-01 DIAGNOSIS — D509 Iron deficiency anemia, unspecified: Secondary | ICD-10-CM | POA: Diagnosis not present

## 2022-07-01 DIAGNOSIS — E119 Type 2 diabetes mellitus without complications: Secondary | ICD-10-CM | POA: Diagnosis not present

## 2022-07-01 DIAGNOSIS — K219 Gastro-esophageal reflux disease without esophagitis: Secondary | ICD-10-CM | POA: Diagnosis not present

## 2022-07-01 DIAGNOSIS — S81812A Laceration without foreign body, left lower leg, initial encounter: Secondary | ICD-10-CM | POA: Diagnosis not present

## 2022-07-01 DIAGNOSIS — M1A9XX0 Chronic gout, unspecified, without tophus (tophi): Secondary | ICD-10-CM | POA: Diagnosis not present

## 2022-07-01 DIAGNOSIS — N1831 Chronic kidney disease, stage 3a: Secondary | ICD-10-CM | POA: Diagnosis not present

## 2022-07-01 DIAGNOSIS — I739 Peripheral vascular disease, unspecified: Secondary | ICD-10-CM | POA: Diagnosis not present

## 2022-07-01 DIAGNOSIS — I4891 Unspecified atrial fibrillation: Secondary | ICD-10-CM | POA: Diagnosis not present

## 2022-07-01 DIAGNOSIS — E782 Mixed hyperlipidemia: Secondary | ICD-10-CM | POA: Diagnosis not present

## 2022-07-01 DIAGNOSIS — I10 Essential (primary) hypertension: Secondary | ICD-10-CM | POA: Diagnosis not present

## 2022-07-01 DIAGNOSIS — Z0000 Encounter for general adult medical examination without abnormal findings: Secondary | ICD-10-CM | POA: Diagnosis not present

## 2022-08-11 DIAGNOSIS — I739 Peripheral vascular disease, unspecified: Secondary | ICD-10-CM | POA: Diagnosis not present

## 2022-08-11 DIAGNOSIS — L97909 Non-pressure chronic ulcer of unspecified part of unspecified lower leg with unspecified severity: Secondary | ICD-10-CM | POA: Diagnosis not present

## 2022-08-11 DIAGNOSIS — L819 Disorder of pigmentation, unspecified: Secondary | ICD-10-CM | POA: Diagnosis not present

## 2022-10-28 ENCOUNTER — Encounter (HOSPITAL_COMMUNITY): Payer: Self-pay | Admitting: *Deleted

## 2023-04-11 ENCOUNTER — Other Ambulatory Visit: Payer: Self-pay | Admitting: Cardiology

## 2023-06-10 ENCOUNTER — Ambulatory Visit: Payer: Medicare Other | Attending: Physician Assistant | Admitting: Physician Assistant

## 2023-06-10 ENCOUNTER — Encounter: Payer: Self-pay | Admitting: Physician Assistant

## 2023-06-10 VITALS — BP 132/80 | HR 61 | Ht 64.0 in | Wt 195.0 lb

## 2023-06-10 DIAGNOSIS — I6523 Occlusion and stenosis of bilateral carotid arteries: Secondary | ICD-10-CM | POA: Diagnosis not present

## 2023-06-10 DIAGNOSIS — N184 Chronic kidney disease, stage 4 (severe): Secondary | ICD-10-CM

## 2023-06-10 DIAGNOSIS — I77819 Aortic ectasia, unspecified site: Secondary | ICD-10-CM

## 2023-06-10 DIAGNOSIS — I739 Peripheral vascular disease, unspecified: Secondary | ICD-10-CM

## 2023-06-10 DIAGNOSIS — I4821 Permanent atrial fibrillation: Secondary | ICD-10-CM | POA: Diagnosis not present

## 2023-06-10 DIAGNOSIS — D649 Anemia, unspecified: Secondary | ICD-10-CM

## 2023-06-10 DIAGNOSIS — I5032 Chronic diastolic (congestive) heart failure: Secondary | ICD-10-CM

## 2023-06-10 DIAGNOSIS — I10 Essential (primary) hypertension: Secondary | ICD-10-CM

## 2023-06-10 NOTE — Patient Instructions (Addendum)
Medication Instructions:   Your physician recommends that you continue on your current medications as directed. Please refer to the Current Medication list given to you today.   *If you need a refill on your cardiac medications before your next appointment, please call your pharmacy*   Lab Work:  BMET BNP AND CBC TODAY   If you have labs (blood work) drawn today and your tests are completely normal, you will receive your results only by: MyChart Message (if you have MyChart) OR A paper copy in the mail If you have any lab test that is abnormal or we need to change your treatment, we will call you to review the results.   Testing/Procedures: NONE ORDERED  TODAY     Follow-Up: At [facility], you and your health needs are our priority.  As part of our continuing mission to provide you with exceptional heart care, we have created designated Provider Care Teams.  These Care Teams include your primary Cardiologist (physician) and Advanced Practice Providers (APPs -  Physician Assistants and Nurse Practitioners) who all work together to provide you with the care you need, when you need it.  We recommend signing up for the patient portal called "MyChart".  Sign up information is provided on this After Visit Summary.  MyChart is used to connect with patients for Virtual Visits (Telemedicine).  Patients are able to view lab/test results, encounter notes, upcoming appointments, etc.  Non-urgent messages can be sent to your provider as well.   To learn more about what you can do with MyChart, go to [email or web address].    Your next appointment:  A REFERRAL HAS BEEN PUT IN FOR WOUND CARE AT HOME  SOMEONE WILL CONTACT WITH  FURTHER INFORMATION. 1 year(s)  Provider:   Armanda Magic, MD     Other Instructions
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
## 2023-06-11 LAB — PRO B NATRIURETIC PEPTIDE: NT-Pro BNP: 4905 pg/mL — ABNORMAL HIGH (ref 0–486)

## 2023-06-11 LAB — BASIC METABOLIC PANEL
BUN/Creatinine Ratio: 13 (ref 10–24)
BUN: 21 mg/dL (ref 8–27)
CO2: 23 mmol/L (ref 20–29)
Calcium: 9.3 mg/dL (ref 8.6–10.2)
Chloride: 105 mmol/L (ref 96–106)
Creatinine, Ser: 1.59 mg/dL — ABNORMAL HIGH (ref 0.76–1.27)
Glucose: 90 mg/dL (ref 70–99)
Potassium: 4.8 mmol/L (ref 3.5–5.2)
Sodium: 142 mmol/L (ref 134–144)
eGFR: 44 mL/min/{1.73_m2} — ABNORMAL LOW (ref >59)

## 2023-06-11 LAB — CBC
Hematocrit: 36.1 % — ABNORMAL LOW (ref 37.5–51.0)
Hemoglobin: 12.3 g/dL — ABNORMAL LOW (ref 13.0–17.7)
MCH: 34.8 pg — ABNORMAL HIGH (ref 26.6–33.0)
MCHC: 34.1 g/dL (ref 31.5–35.7)
MCV: 102 fL — ABNORMAL HIGH (ref 79–97)
Platelets: 277 10*3/uL (ref 150–450)
RBC: 3.53 x10E6/uL — ABNORMAL LOW (ref 4.14–5.80)
RDW: 12.5 % (ref 11.6–15.4)
WBC: 7.1 10*3/uL (ref 3.4–10.8)

## 2023-06-24 ENCOUNTER — Telehealth: Payer: Self-pay | Admitting: Cardiology

## 2023-06-24 DIAGNOSIS — L97929 Non-pressure chronic ulcer of unspecified part of left lower leg with unspecified severity: Secondary | ICD-10-CM

## 2023-06-24 DIAGNOSIS — R600 Localized edema: Secondary | ICD-10-CM

## 2023-06-24 NOTE — Telephone Encounter (Signed)
Wife called to follow up on order for home health nurse to work on patient's legs.

## 2023-06-24 NOTE — Telephone Encounter (Signed)
Per pt's wife mentioned that pt needs home health and they have not heard anything Will forward to [employer] PA .Zack Seal

## 2023-06-27 NOTE — Telephone Encounter (Signed)
Wife is calling back for update. Please advise  

## 2023-06-27 NOTE — Telephone Encounter (Signed)
Called patient's wife and gave her Editor, commissioning. Patient's wife stated their PCP is out of town at this time. Informed patient's wife that they might need to see a wound clinic first.  She asked about [facility], tried to call them, and their office is closed at this time and just has answering service. Will send back to provider to see if they want to refer to wound clinic or social worker to help.

## 2023-06-28 NOTE — Telephone Encounter (Signed)
Placed referral to Wound Clinic

## 2023-07-02 ENCOUNTER — Other Ambulatory Visit: Payer: Self-pay | Admitting: Cardiology

## 2023-07-20 ENCOUNTER — Other Ambulatory Visit: Payer: Self-pay

## 2023-07-20 ENCOUNTER — Encounter (HOSPITAL_COMMUNITY): Payer: Self-pay | Admitting: Emergency Medicine

## 2023-07-20 ENCOUNTER — Emergency Department (HOSPITAL_COMMUNITY): Payer: Medicare Other

## 2023-07-20 ENCOUNTER — Inpatient Hospital Stay (HOSPITAL_COMMUNITY)
Admission: EM | Admit: 2023-07-20 | Discharge: 2023-07-23 | DRG: 637 | Disposition: A | Discharge status: Home Health | Payer: Medicare Other | Attending: Internal Medicine | Admitting: Internal Medicine

## 2023-07-20 DIAGNOSIS — Y92239 Unspecified place in hospital as the place of occurrence of the external cause: Secondary | ICD-10-CM | POA: Diagnosis not present

## 2023-07-20 DIAGNOSIS — I4821 Permanent atrial fibrillation: Secondary | ICD-10-CM | POA: Diagnosis present

## 2023-07-20 DIAGNOSIS — N1831 Chronic kidney disease, stage 3a: Secondary | ICD-10-CM | POA: Diagnosis present

## 2023-07-20 DIAGNOSIS — Z7901 Long term (current) use of anticoagulants: Secondary | ICD-10-CM | POA: Diagnosis not present

## 2023-07-20 DIAGNOSIS — I130 Hypertensive heart and chronic kidney disease with heart failure and stage 1 through stage 4 chronic kidney disease, or unspecified chronic kidney disease: Secondary | ICD-10-CM | POA: Diagnosis present

## 2023-07-20 DIAGNOSIS — Z6833 Body mass index (BMI) 33.0-33.9, adult: Secondary | ICD-10-CM

## 2023-07-20 DIAGNOSIS — L03116 Cellulitis of left lower limb: Secondary | ICD-10-CM | POA: Diagnosis present

## 2023-07-20 DIAGNOSIS — E669 Obesity, unspecified: Secondary | ICD-10-CM | POA: Diagnosis present

## 2023-07-20 DIAGNOSIS — E785 Hyperlipidemia, unspecified: Secondary | ICD-10-CM | POA: Diagnosis present

## 2023-07-20 DIAGNOSIS — J9601 Acute respiratory failure with hypoxia: Secondary | ICD-10-CM | POA: Diagnosis not present

## 2023-07-20 DIAGNOSIS — N401 Enlarged prostate with lower urinary tract symptoms: Secondary | ICD-10-CM | POA: Diagnosis present

## 2023-07-20 DIAGNOSIS — Z7409 Other reduced mobility: Secondary | ICD-10-CM | POA: Diagnosis present

## 2023-07-20 DIAGNOSIS — H1132 Conjunctival hemorrhage, left eye: Secondary | ICD-10-CM | POA: Diagnosis present

## 2023-07-20 DIAGNOSIS — Z79899 Other long term (current) drug therapy: Secondary | ICD-10-CM

## 2023-07-20 DIAGNOSIS — I5032 Chronic diastolic (congestive) heart failure: Secondary | ICD-10-CM | POA: Diagnosis present

## 2023-07-20 DIAGNOSIS — T17298A Other foreign object in pharynx causing other injury, initial encounter: Secondary | ICD-10-CM | POA: Diagnosis not present

## 2023-07-20 DIAGNOSIS — W448XXA Other foreign body entering into or through a natural orifice, initial encounter: Secondary | ICD-10-CM | POA: Diagnosis not present

## 2023-07-20 DIAGNOSIS — E1151 Type 2 diabetes mellitus with diabetic peripheral angiopathy without gangrene: Secondary | ICD-10-CM | POA: Diagnosis present

## 2023-07-20 DIAGNOSIS — Z7982 Long term (current) use of aspirin: Secondary | ICD-10-CM

## 2023-07-20 DIAGNOSIS — E1165 Type 2 diabetes mellitus with hyperglycemia: Secondary | ICD-10-CM | POA: Diagnosis present

## 2023-07-20 DIAGNOSIS — K219 Gastro-esophageal reflux disease without esophagitis: Secondary | ICD-10-CM | POA: Diagnosis present

## 2023-07-20 DIAGNOSIS — E11622 Type 2 diabetes mellitus with other skin ulcer: Secondary | ICD-10-CM | POA: Diagnosis present

## 2023-07-20 DIAGNOSIS — S81802A Unspecified open wound, left lower leg, initial encounter: Principal | ICD-10-CM | POA: Diagnosis present

## 2023-07-20 DIAGNOSIS — E1122 Type 2 diabetes mellitus with diabetic chronic kidney disease: Secondary | ICD-10-CM | POA: Diagnosis present

## 2023-07-20 DIAGNOSIS — Z66 Do not resuscitate: Secondary | ICD-10-CM | POA: Diagnosis present

## 2023-07-20 DIAGNOSIS — H9190 Unspecified hearing loss, unspecified ear: Secondary | ICD-10-CM | POA: Diagnosis present

## 2023-07-20 DIAGNOSIS — D539 Nutritional anemia, unspecified: Secondary | ICD-10-CM | POA: Diagnosis present

## 2023-07-20 DIAGNOSIS — M109 Gout, unspecified: Secondary | ICD-10-CM | POA: Diagnosis present

## 2023-07-20 DIAGNOSIS — E11621 Type 2 diabetes mellitus with foot ulcer: Principal | ICD-10-CM | POA: Diagnosis present

## 2023-07-20 DIAGNOSIS — L97529 Non-pressure chronic ulcer of other part of left foot with unspecified severity: Secondary | ICD-10-CM | POA: Diagnosis present

## 2023-07-20 DIAGNOSIS — L97909 Non-pressure chronic ulcer of unspecified part of unspecified lower leg with unspecified severity: Secondary | ICD-10-CM | POA: Diagnosis not present

## 2023-07-20 LAB — CBC WITH DIFFERENTIAL/PLATELET
Abs Immature Granulocytes: 0.04 K/uL (ref 0.00–0.07)
Basophils Absolute: 0 K/uL (ref 0.0–0.1)
Basophils Relative: 0 %
Eosinophils Absolute: 0 K/uL (ref 0.0–0.5)
Eosinophils Relative: 0 %
HCT: 36.9 % — ABNORMAL LOW (ref 39.0–52.0)
Hemoglobin: 12.1 g/dL — ABNORMAL LOW (ref 13.0–17.0)
Immature Granulocytes: 1 %
Lymphocytes Relative: 7 %
Lymphs Abs: 0.4 K/uL — ABNORMAL LOW (ref 0.7–4.0)
MCH: 33.3 pg (ref 26.0–34.0)
MCHC: 32.8 g/dL (ref 30.0–36.0)
MCV: 101.7 fL — ABNORMAL HIGH (ref 80.0–100.0)
Monocytes Absolute: 0.5 K/uL (ref 0.1–1.0)
Monocytes Relative: 9 %
Neutro Abs: 5.2 K/uL (ref 1.7–7.7)
Neutrophils Relative %: 83 %
Platelets: 235 K/uL (ref 150–400)
RBC: 3.63 MIL/uL — ABNORMAL LOW (ref 4.22–5.81)
RDW: 12.8 % (ref 11.5–15.5)
WBC: 6.2 K/uL (ref 4.0–10.5)
nRBC: 0 % (ref 0.0–0.2)

## 2023-07-20 LAB — BASIC METABOLIC PANEL WITH GFR
Anion gap: 8 (ref 5–15)
BUN: 24 mg/dL — ABNORMAL HIGH (ref 8–23)
CO2: 27 mmol/L (ref 22–32)
Calcium: 9 mg/dL (ref 8.9–10.3)
Chloride: 104 mmol/L (ref 98–111)
Creatinine, Ser: 1.24 mg/dL (ref 0.61–1.24)
GFR, Estimated: 59 mL/min — ABNORMAL LOW
Glucose, Bld: 141 mg/dL — ABNORMAL HIGH (ref 70–99)
Potassium: 3.5 mmol/L (ref 3.5–5.1)
Sodium: 139 mmol/L (ref 135–145)

## 2023-07-20 LAB — I-STAT CG4 LACTIC ACID, ED: Lactic Acid, Venous: 1 mmol/L (ref 0.5–1.9)

## 2023-07-20 MED ORDER — VANCOMYCIN HCL 1250 MG/250ML IV SOLN: 1000.0000 mg | INTRAVENOUS | Status: DC

## 2023-07-20 MED ORDER — PANTOPRAZOLE SODIUM 40 MG PO TBEC
40.0000 mg | DELAYED_RELEASE_TABLET | Freq: Every day | ORAL | Status: DC
  Administered 2023-07-21 – 2023-07-23 (×3): 40 mg via ORAL
  Filled 2023-07-20 (×3): qty 1

## 2023-07-20 MED ORDER — LACTATED RINGERS IV SOLN: INTRAVENOUS | Status: DC

## 2023-07-20 MED ORDER — SODIUM CHLORIDE 0.9 % IV BOLUS
1000.0000 mL | Freq: Once | INTRAVENOUS | Status: AC
  Administered 2023-07-20: 1000 mL via INTRAVENOUS

## 2023-07-20 MED ORDER — LORAZEPAM 2 MG/ML IJ SOLN
1.0000 mg | Freq: Once | INTRAMUSCULAR | Status: AC
  Administered 2023-07-20: 1 mg via INTRAVENOUS
  Filled 2023-07-20: qty 1

## 2023-07-20 MED ORDER — POLYETHYLENE GLYCOL 3350 17 G PO PACK
17.0000 g | PACK | Freq: Every day | ORAL | Status: DC | PRN
  Filled 2023-07-20: qty 1

## 2023-07-20 MED ORDER — HEPARIN SODIUM (PORCINE) 5000 UNIT/ML IJ SOLN
5000.0000 [IU] | Freq: Three times a day (TID) | INTRAMUSCULAR | Status: DC
Start: 2023-07-21 — End: 2023-07-23
  Administered 2023-07-21 – 2023-07-23 (×7): 5000 [IU] via SUBCUTANEOUS
  Filled 2023-07-20 (×7): qty 1

## 2023-07-20 MED ORDER — ACETAMINOPHEN 325 MG PO TABS: 650.0000 mg | ORAL_TABLET | Freq: Four times a day (QID) | ORAL | Status: DC | PRN

## 2023-07-20 MED ORDER — HYDRALAZINE HCL 20 MG/ML IJ SOLN: 5.0000 mg | Freq: Four times a day (QID) | INTRAMUSCULAR | Status: DC | PRN

## 2023-07-20 MED ORDER — VANCOMYCIN HCL IN DEXTROSE 1-5 GM/200ML-% IV SOLN
1000.0000 mg | Freq: Once | INTRAVENOUS | Status: AC
  Administered 2023-07-20: 1000 mg via INTRAVENOUS
  Filled 2023-07-20: qty 200

## 2023-07-20 MED ORDER — MELATONIN 5 MG PO TABS
5.0000 mg | ORAL_TABLET | Freq: Every evening | ORAL | Status: DC | PRN
  Administered 2023-07-22 (×2): 5 mg via ORAL
  Filled 2023-07-20 (×2): qty 1

## 2023-07-20 MED ORDER — METRONIDAZOLE 500 MG/100ML IV SOLN
500.0000 mg | Freq: Two times a day (BID) | INTRAVENOUS | Status: DC
  Administered 2023-07-21 – 2023-07-23 (×5): 500 mg via INTRAVENOUS
  Filled 2023-07-20 (×5): qty 100

## 2023-07-20 MED ORDER — SODIUM CHLORIDE 0.9 % IV SOLN
2.0000 g | Freq: Two times a day (BID) | INTRAVENOUS | Status: DC
  Administered 2023-07-21 (×3): 2 g via INTRAVENOUS
  Filled 2023-07-20 (×3): qty 12.5

## 2023-07-20 MED ORDER — PROCHLORPERAZINE EDISYLATE 10 MG/2ML IJ SOLN: 5.0000 mg | Freq: Four times a day (QID) | INTRAMUSCULAR | Status: DC | PRN

## 2023-07-20 MED ORDER — PIPERACILLIN-TAZOBACTAM 3.375 G IVPB 30 MIN: 3.3750 g | Freq: Once | INTRAVENOUS | Status: DC

## 2023-07-20 NOTE — ED Notes (Signed)
Patient daughter stated "patient was threatening to leave because they have been here for a long time. " I made triage nurse aware.  Charge nurse came out and took him to a room.

## 2023-07-20 NOTE — ED Provider Notes (Signed)
[place] EMERGENCY DEPARTMENT AT [facility] Provider Note   CSN: [id number] Arrival date & time: 07/20/23  1607     History  Chief Complaint  Patient presents with   Leg Wound    Mackenzie W Anddy Waldeck. is a 82 y.o. male history of CKD, diabetes, peripheral vascular disease here presenting with leg wound.  Patient had a wound on the left leg that is been going on for about a month now.  Patient saw primary care doctor and started on doxycyline since October 17.  Despite that, patient has worsening purulent discharge.  Patient has visiting nurse that comes in changes the dressing and was concerned about the patient so sent the patient in for further evaluation.  No fevers at home.  The history is provided by the patient.       Home Medications Prior to Admission medications   Medication Sig Start Date End Date Taking? Authorizing Provider  Acetaminophen (TYLENOL) 325 MG CAPS Take 325 mg by mouth daily as needed (For Pain).    [provider]  aspirin EC 81 MG tablet 325 mg as needed.    [provider]  colchicine 0.6 MG tablet Take 1 tablet (0.6 mg total) by mouth daily. 03/03/20   Leroy Sea, MD  furosemide (LASIX) 20 MG tablet Take 1 tablet (20 mg total) by mouth daily. 02/28/21 05/17/22  [place], Alvira Philips, DO  metoprolol succinate (TOPROL XL) 25 MG 24 hr tablet Take 1 tablet (25 mg total) by mouth daily. Take with or immediately following a meal. Take with 100mg  tablet daily for total dose 125mg  daily 05/17/22   Sharlene Dory, PA-C  metoprolol succinate (TOPROL-XL) 100 MG 24 hr tablet Take 1 tablet by mouth once daily 07/05/23   Quintella Reichert, MD  pantoprazole (PROTONIX) 40 MG tablet Take 1 tablet (40 mg total) by mouth daily. 03/03/20   Leroy Sea, MD  potassium chloride (KLOR-CON) 10 MEQ tablet Take 1 tablet (10 mEq total) by mouth daily. 02/28/21 05/17/22  [place], Eric J, DO      Allergies    Patient has no known allergies.    Review of  Systems   Review of Systems  Skin:  Positive for wound.  All other systems reviewed and are negative.   Physical Exam Updated Vital Signs BP (!) 157/71 (BP Location: Right Arm)   Pulse (!) 59   Temp 98.4 F (36.9 C)   Resp 16   Ht 5\' 4"  (1.626 m)   Wt 88.5 kg   SpO2 95%   BMI 33.47 kg/m  Physical Exam Vitals and nursing note reviewed.  Constitutional:      Appearance: Normal appearance.  HENT:     Head: Normocephalic.     Nose: Nose normal.     Mouth/Throat:     Mouth: Mucous membranes are dry.  Eyes:     Extraocular Movements: Extraocular movements intact.     Pupils: Pupils are equal, round, and reactive to light.  Cardiovascular:     Rate and Rhythm: Normal rate and regular rhythm.     Pulses: Normal pulses.     Heart sounds: Normal heart sounds.  Pulmonary:     Effort: Pulmonary effort is normal.     Breath sounds: Normal breath sounds.  Abdominal:     General: Abdomen is flat.     Palpations: Abdomen is soft.  Musculoskeletal:     Cervical back: Normal range of motion and neck supple.  Comments: Patient has an large ulcer on the lateral aspect of the left lower leg.  There is surrounding cellulitis.  Patient does have 2+ DP pulse.  Questionable crepitus  Skin:    Capillary Refill: Capillary refill takes less than 2 seconds.     Findings: Erythema present.  Neurological:     General: No focal deficit present.     Mental Status: He is alert and oriented to person, place, and time.     ED Results / Procedures / Treatments   Labs (all labs ordered are listed, but only abnormal results are displayed) Labs Reviewed  CBC WITH DIFFERENTIAL/PLATELET - Abnormal; Notable for the following components:      Result Value   RBC 3.63 (*)    Hemoglobin 12.1 (*)    HCT 36.9 (*)    MCV 101.7 (*)    Lymphs Abs 0.4 (*)    All other components within normal limits  BASIC METABOLIC PANEL - Abnormal; Notable for the following components:   Glucose, Bld 141 (*)    BUN  24 (*)    GFR, Estimated 59 (*)    All other components within normal limits  CULTURE, BLOOD (ROUTINE X 2)  CULTURE, BLOOD (ROUTINE X 2)  I-STAT CG4 LACTIC ACID, ED    EKG None  Radiology DG Tibia/Fibula Left  Result Date: 07/20/2023 CLINICAL DATA:  Left leg wound EXAM: LEFT TIBIA AND FIBULA - 2 VIEW COMPARISON:  None Available. FINDINGS: Frontal and lateral views of the left tibia and fibula are obtained. There are no acute or destructive bony abnormalities. Alignment of the left knee and ankle is anatomic. Moderate medial compartmental osteoarthritis of the left knee. There is diffuse soft tissue swelling throughout the left lower leg, greatest distally and laterally. Minimal subcutaneous gas within the lateral distal left lower leg. No radiopaque foreign body. IMPRESSION: 1. Diffuse soft tissue swelling of the left lower leg, with punctate subcutaneous gas distally and laterally. This could reflect gas-forming infection or overlying ulceration. Correlation with visual inspection recommended. 2. No evidence of fracture, osteomyelitis, or radiopaque foreign body. 3. Left knee osteoarthritis. Electronically Signed   By: Sharlet Salina M.D.   On: 07/20/2023 18:20    Procedures Procedures    Medications Ordered in ED Medications  vancomycin (VANCOCIN) IVPB 1000 mg/200 mL premix (has no administration in time range)  piperacillin-tazobactam (ZOSYN) IVPB 3.375 g (has no administration in time range)  sodium chloride 0.9 % bolus 1,000 mL (has no administration in time range)  LORazepam (ATIVAN) injection 1 mg (has no administration in time range)    ED Course/ Medical Decision Making/ A&P                                 Medical Decision Making Marshall W Randal Wiesehan. is a 82 y.o. male here presenting with infected ulcer in the left leg.  There is purulent drainage and also surrounding cellulitis.  Will get x-ray to rule out necrotizing infection.  Will get CBC and CMP and lactate and  cultures.  Patient will need IV antibiotics and admission.  9:04 PM X-ray showed subcutaneous gas.  Concern for possible necrotizing infection.  Consulted Dr. Luisa Hart from surgery.  He states that this is something orthopedic surgery needs to take care of.  10:11 PM I discussed with Dr. Thad Ranger from ortho.  He came and saw the patient.  He did not think this is necrotizing fasciitis  and likely the air is within the ulcer.  He states that Dr. Lajoyce Corners will see the patient tomorrow.  Recommend IV antibiotics and hospitalist admission and transfer to [facility].    Problems Addressed: Left leg cellulitis: acute illness or injury Leg wound, left, initial encounter: acute illness or injury  Amount and/or Complexity of Data Reviewed Labs: ordered. Decision-making details documented in ED Course. Radiology: ordered and independent interpretation performed. Decision-making details documented in ED Course.  Risk Prescription drug management.    Final Clinical Impression(s) / ED Diagnoses Final diagnoses:  None    Rx / DC Orders ED Discharge Orders     None         Charlynne Pander, MD 07/20/23 2214

## 2023-07-20 NOTE — Progress Notes (Addendum)
Pharmacy Antibiotic Note  [name] is a 82 y.o. male admitted on 07/20/2023 with medical history significant for chronic left lateral leg wound, hypertension, gout, GERD, chronic HFpEF, who presented to the ED from home due to difficult to heal chronic left lateral leg wound with concern for infection .  Pharmacy has been consulted for cefepime dosing.  Plan: Cefepime 2gm IV q12h Vanc adjusted to 1250mg  IV q36h (AUC 503.3, Scr 1.24) Follow renal function and clinical course  Height: 5\' 4"  (162.6 cm) Weight: 88.5 kg (195 lb) IBW/kg (Calculated) : 59.2  Temp (24hrs), Avg:98.4 F (36.9 C), Min:98.3 F (36.8 C), Max:98.4 F (36.9 C)  Recent Labs  Lab 07/20/23 1639 07/20/23 2329  WBC 6.2  --   CREATININE 1.24  --   LATICACIDVEN  --  1.0    Estimated Creatinine Clearance: 47.6 mL/min (by C-G formula based on SCr of 1.24 mg/dL).    No Known Allergies  Antimicrobials this admission: 10/30 vanc >> 10/31 cefepime >> 10/31 flagyl >>  Dose adjustments this admission:   Microbiology results: 10/30 BCx:   Thank you for allowing pharmacy to be a part of this patient's care.  Arley Phenix RPh 07/20/2023, 11:54 PM

## 2023-07-20 NOTE — ED Notes (Signed)
Pt was assisted to the bathroom with a wheelchair.
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
## 2023-07-20 NOTE — ED Provider Triage Note (Signed)
Emergency Medicine Provider Triage Evaluation Note  Petronilo Layman. , a 82 y.o. male  was evaluated in triage.  Pt complains of possible wound infection.  Review of Systems  Positive:  Negative:   Physical Exam  BP (!) 157/71 (BP Location: Right Arm)   Pulse (!) 56   Temp 98.3 F (36.8 C) (Oral)   Resp 16   Ht 5\' 4"  (1.626 m)   Wt 88.5 kg   SpO2 100%   BMI 33.47 kg/m  Gen:   Awake, no distress   Resp:  Normal effort  MSK:   Moves extremities without difficulty  Other:    Medical Decision Making  Medically screening exam initiated at 4:21 PM.  Appropriate orders placed.  Leamon W Lisa Roca. was informed that the remainder of the evaluation will be completed by another provider, this initial triage assessment does not replace that evaluation, and the importance of remaining in the ED until their evaluation is complete.  Patient has leg wounds that has been cared for by home health nurse. Nurse stating that she thinks wounds are getting worse. Wife at bedside stating that they looks the same to her. Patient denying fever, chest pain, nausea, vomiting, diarrhea.    Valrie Hart F, [place] 07/20/23 1623

## 2023-07-20 NOTE — ED Notes (Signed)
ED TO INPATIENT HANDOFF REPORT  Name/Age/Gender [name] 82 y.o. male  Code Status    Code Status Orders  (From admission, onward)           Start     Ordered   07/20/23 2250  Do not attempt resuscitation (DNR)- Limited -Do Not Intubate (DNI)  (Code Status)  Continuous       Question Answer Comment  If pulseless and not breathing No CPR or chest compressions.   In Pre-Arrest Conditions (Patient Is Breathing and Has A Pulse) Do not intubate. Provide all appropriate non-invasive medical interventions. Avoid ICU transfer unless indicated or required.   Consent: Discussion documented in EHR or advanced directives reviewed      07/20/23 2249           Code Status History     Date Active Date Inactive Code Status Order ID Comments User Context   07/20/2023 2227 07/20/2023 2249 Full Code [id number]  Darlin Drop, DO ED   02/26/2021 1946 02/28/2021 1758 DNR [id number]  Eduard Clos, MD ED   02/28/2020 1659 03/02/2020 1916 DNR [id number]  Jonah Blue, MD ED   07/21/2015 1509 07/23/2015 1723 Full Code [id number]  Jethro Bolus, MD Inpatient   04/14/2015 2131 04/23/2015 1835 DNR [id number]  Eduard Clos, MD Inpatient      Advance Directive Documentation    Flowsheet Row Most Recent Value  Type of Advance Directive Healthcare Power of Attorney, Living will  Pre-existing out of facility DNR order (yellow form or pink MOST form) --  "MOST" Form in Place? --       Home/SNF/Other Home  Chief Complaint Leg wound, left [S81.802A]  Level of Care/Admitting Diagnosis ED Disposition     ED Disposition  Admit   Condition  --   Comment  Hospital Area: [facility] [[id number]]  Level of Care: Telemetry Surgical [105]  May admit patient to [facility] or [facility] if equivalent level of care is available:: No  Covid Evaluation: Asymptomatic - no recent exposure (last 10 days) testing not required  Diagnosis: Leg wound, left [176160]   Admitting Physician: Darlin Drop [[id number]]  Attending Physician: Darlin Drop [[id number]]  Certification:: I certify this patient will need inpatient services for at least 2 midnights          Medical History Past Medical History:  Diagnosis Date   Benign localized hyperplasia of prostate with urinary obstruction 07/21/2015   CAP (community acquired pneumonia) 04/14/2015   Carotid stenosis    left   CKD (chronic kidney disease) stage 4, GFR 15-29 ml/min (HCC) 04/14/2015   Complication of anesthesia    Essential hypertension 04/14/2015   Gram-negative bacteremia 04/15/2015   Headache    migraines   Hearing loss    Hematuria 04/19/2015   HLD (hyperlipidemia)    Normocytic hypochromic anemia 04/14/2015   Permanent atrial fibrillation (HCC) 04/14/2015   PONV (postoperative nausea and vomiting)    Sepsis secondary to UTI (HCC) 04/23/2015   Transfusion history    last 04-24-15   Urinary retention     Allergies No Known Allergies  IV Location/Drains/Wounds Patient Lines/Drains/Airways Status     Active Line/Drains/Airways     Name Placement date Placement time Site Days   Peripheral IV 07/20/23 20 G Anterior;Distal;Right;Upper Arm 07/20/23  2318  Arm  less than 1   Peripheral IV 07/20/23 20 G Anterior;Distal;Left;Upper Antecubital 07/20/23  2319  Antecubital  less than 1  Labs/Imaging Results for orders placed or performed during the hospital encounter of 07/20/23 (from the past 48 hour(s))  CBC with Differential     Status: Abnormal   Collection Time: 07/20/23  4:39 PM  Result Value Ref Range   WBC 6.2 4.0 - 10.5 K/uL   RBC 3.63 (L) 4.22 - 5.81 MIL/uL   Hemoglobin 12.1 (L) 13.0 - 17.0 g/dL   HCT 81.1 (L) 91.4 - 78.2 %   MCV 101.7 (H) 80.0 - 100.0 fL   MCH 33.3 26.0 - 34.0 pg   MCHC 32.8 30.0 - 36.0 g/dL   RDW 95.6 21.3 - 08.6 %   Platelets 235 150 - 400 K/uL   nRBC 0.0 0.0 - 0.2 %   Neutrophils Relative % 83 %   Neutro Abs 5.2 1.7 - 7.7 K/uL    Lymphocytes Relative 7 %   Lymphs Abs 0.4 (L) 0.7 - 4.0 K/uL   Monocytes Relative 9 %   Monocytes Absolute 0.5 0.1 - 1.0 K/uL   Eosinophils Relative 0 %   Eosinophils Absolute 0.0 0.0 - 0.5 K/uL   Basophils Relative 0 %   Basophils Absolute 0.0 0.0 - 0.1 K/uL   Immature Granulocytes 1 %   Abs Immature Granulocytes 0.04 0.00 - 0.07 K/uL    Comment: Performed at [facility], [address] [place], [place] [address]  Basic metabolic panel     Status: Abnormal   Collection Time: 07/20/23  4:39 PM  Result Value Ref Range   Sodium 139 135 - 145 mmol/L   Potassium 3.5 3.5 - 5.1 mmol/L   Chloride 104 98 - 111 mmol/L   CO2 27 22 - 32 mmol/L   Glucose, Bld 141 (H) 70 - 99 mg/dL    Comment: Glucose reference range applies only to samples taken after fasting for at least 8 hours.   BUN 24 (H) 8 - 23 mg/dL   Creatinine, Ser 9.62 0.61 - 1.24 mg/dL   Calcium 9.0 8.9 - 95.2 mg/dL   GFR, Estimated 59 (L) >60 mL/min    Comment: (NOTE) Calculated using the CKD-EPI Creatinine Equation (2021)    Anion gap 8 5 - 15    Comment: Performed at [facility], [address] [place], [place] [address]   DG Tibia/Fibula Left  Result Date: 07/20/2023 CLINICAL DATA:  Left leg wound EXAM: LEFT TIBIA AND FIBULA - 2 VIEW COMPARISON:  None Available. FINDINGS: Frontal and lateral views of the left tibia and fibula are obtained. There are no acute or destructive bony abnormalities. Alignment of the left knee and ankle is anatomic. Moderate medial compartmental osteoarthritis of the left knee. There is diffuse soft tissue swelling throughout the left lower leg, greatest distally and laterally. Minimal subcutaneous gas within the lateral distal left lower leg. No radiopaque foreign body. IMPRESSION: 1. Diffuse soft tissue swelling of the left lower leg, with punctate subcutaneous gas distally and laterally. This could reflect gas-forming infection or overlying ulceration.  Correlation with visual inspection recommended. 2. No evidence of fracture, osteomyelitis, or radiopaque foreign body. 3. Left knee osteoarthritis. Electronically Signed   By: Sharlet Salina M.D.   On: 07/20/2023 18:20    Pending Labs Unresulted Labs (From admission, onward)     Start     Ordered   07/21/23 0500  CBC  Tomorrow morning,   R        07/20/23 2227   07/21/23 0500  Basic metabolic panel  Tomorrow morning,  R        07/20/23 2227   07/21/23 0500  Magnesium  Tomorrow morning,   R        07/20/23 2227   07/21/23 0500  Phosphorus  Tomorrow morning,   R        07/20/23 2227   07/21/23 0500  Hemoglobin A1c  Tomorrow morning,   R        07/20/23 2228   07/21/23 0500  C-reactive protein  Tomorrow morning,   R        07/20/23 2228   07/20/23 2056  Blood culture (routine x 2)  BLOOD CULTURE X 2,   R (with STAT occurrences)      07/20/23 2056            Vitals/Pain Today's Vitals   07/20/23 1613 07/20/23 1615 07/20/23 2048 07/20/23 2051  BP: (!) 157/71   (!) 207/100  Pulse: (!) 56   (!) 59  Resp: 16   16  Temp: 98.3 F (36.8 C)  98.4 F (36.9 C)   TempSrc: Oral     SpO2: 100%   95%  Weight:  195 lb (88.5 kg)    Height:  5\' 4"  (1.626 m)    PainSc:  0-No pain      Isolation Precautions No active isolations  Medications Medications  vancomycin (VANCOCIN) IVPB 1000 mg/200 mL premix (has no administration in time range)  piperacillin-tazobactam (ZOSYN) IVPB 3.375 g (has no administration in time range)  sodium chloride 0.9 % bolus 1,000 mL (has no administration in time range)  LORazepam (ATIVAN) injection 1 mg (has no administration in time range)  acetaminophen (TYLENOL) tablet 650 mg (has no administration in time range)  prochlorperazine (COMPAZINE) injection 5 mg (has no administration in time range)  polyethylene glycol (MIRALAX / GLYCOLAX) packet 17 g (has no administration in time range)  melatonin tablet 5 mg (has no administration in time range)  heparin  injection 5,000 Units (has no administration in time range)  hydrALAZINE (APRESOLINE) injection 5 mg (has no administration in time range)    Mobility walks with device

## 2023-07-20 NOTE — ED Triage Notes (Signed)
Patient arrives in wheelchair by POV with spouse states patients PCP and home health requested patient come in to be evaluated to left leg wound. Wife states she does not think it is worse then usual. States they have referral for wound care but waiting to hear back from them. Patient denies any pain to leg. Leg was wrapped by home health yesterday.
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
## 2023-07-20 NOTE — ED Notes (Signed)
Report given coutrney, rn

## 2023-07-21 ENCOUNTER — Inpatient Hospital Stay (HOSPITAL_COMMUNITY): Payer: Medicare Other

## 2023-07-21 ENCOUNTER — Other Ambulatory Visit: Payer: Self-pay

## 2023-07-21 DIAGNOSIS — S81802A Unspecified open wound, left lower leg, initial encounter: Secondary | ICD-10-CM

## 2023-07-21 DIAGNOSIS — I5032 Chronic diastolic (congestive) heart failure: Secondary | ICD-10-CM

## 2023-07-21 DIAGNOSIS — L97909 Non-pressure chronic ulcer of unspecified part of unspecified lower leg with unspecified severity: Secondary | ICD-10-CM

## 2023-07-21 LAB — BLOOD GAS, ARTERIAL
Acid-Base Excess: 0.8 mmol/L (ref 0.0–2.0)
Bicarbonate: 25.3 mmol/L (ref 20.0–28.0)
O2 Saturation: 100 %
Patient temperature: 37.1
pCO2 arterial: 39 mm[Hg] (ref 32–48)
pH, Arterial: 7.42 (ref 7.35–7.45)
pO2, Arterial: 444 mm[Hg] — ABNORMAL HIGH (ref 83–108)

## 2023-07-21 LAB — ECHOCARDIOGRAM COMPLETE
Height: 64 in
MV M vel: 3.57 m/s
MV Peak grad: 51 mm[Hg]
S' Lateral: 2.9 cm
Single Plane A4C EF: 62.2 %
Weight: 3022.95 [oz_av]

## 2023-07-21 LAB — GLUCOSE, CAPILLARY
Glucose-Capillary: 125 mg/dL — ABNORMAL HIGH (ref 70–99)
Glucose-Capillary: 128 mg/dL — ABNORMAL HIGH (ref 70–99)

## 2023-07-21 LAB — MRSA NEXT GEN BY PCR, NASAL
MRSA by PCR Next Gen: NOT DETECTED
MRSA by PCR Next Gen: NOT DETECTED

## 2023-07-21 LAB — I-STAT CG4 LACTIC ACID, ED: Lactic Acid, Venous: 1 mmol/L (ref 0.5–1.9)

## 2023-07-21 MED ORDER — IOHEXOL 350 MG/ML SOLN
75.0000 mL | Freq: Once | INTRAVENOUS | Status: AC | PRN
  Administered 2023-07-21: 75 mL via INTRAVENOUS

## 2023-07-21 MED ORDER — FUROSEMIDE 10 MG/ML IJ SOLN
40.0000 mg | Freq: Two times a day (BID) | INTRAMUSCULAR | Status: DC
  Administered 2023-07-21: 40 mg via INTRAVENOUS
  Filled 2023-07-21: qty 4

## 2023-07-21 MED ORDER — ORAL CARE MOUTH RINSE: 15.0000 mL | OROMUCOSAL | Status: DC | PRN

## 2023-07-21 MED ORDER — METOPROLOL SUCCINATE ER 25 MG PO TB24
125.0000 mg | ORAL_TABLET | Freq: Every day | ORAL | Status: DC
  Administered 2023-07-21 – 2023-07-23 (×3): 125 mg via ORAL
  Filled 2023-07-21 (×3): qty 1

## 2023-07-21 MED ORDER — HYDRALAZINE HCL 20 MG/ML IJ SOLN
5.0000 mg | INTRAMUSCULAR | Status: DC | PRN
  Administered 2023-07-21 – 2023-07-23 (×2): 5 mg via INTRAVENOUS
  Filled 2023-07-21 (×2): qty 1

## 2023-07-21 MED ORDER — FUROSEMIDE 10 MG/ML IJ SOLN
40.0000 mg | Freq: Once | INTRAMUSCULAR | Status: AC
  Administered 2023-07-21: 40 mg via INTRAVENOUS
  Filled 2023-07-21: qty 4

## 2023-07-21 MED ORDER — COLLAGENASE 250 UNIT/GM EX OINT
TOPICAL_OINTMENT | Freq: Every day | CUTANEOUS | Status: DC
  Filled 2023-07-21: qty 30

## 2023-07-21 MED ORDER — IPRATROPIUM-ALBUTEROL 0.5-2.5 (3) MG/3ML IN SOLN: 3.0000 mL | RESPIRATORY_TRACT | Status: DC | PRN

## 2023-07-21 MED ORDER — LORAZEPAM 2 MG/ML IJ SOLN
1.0000 mg | Freq: Once | INTRAMUSCULAR | Status: DC
  Filled 2023-07-21: qty 1

## 2023-07-21 MED ORDER — LORAZEPAM 2 MG/ML IJ SOLN
0.5000 mg | Freq: Once | INTRAMUSCULAR | Status: AC
  Administered 2023-07-21: 0.5 mg via INTRAVENOUS
  Filled 2023-07-21: qty 1

## 2023-07-21 MED ORDER — LORAZEPAM 2 MG/ML IJ SOLN: 1.0000 mg | Freq: Once | INTRAMUSCULAR | Status: DC

## 2023-07-21 NOTE — Progress Notes (Signed)
   07/21/23 0313  Assess: MEWS Score  Temp 98.2 F (36.8 C)  BP (!) 176/89  MAP (mmHg) 111  Pulse Rate 83  ECG Heart Rate 78  Resp (!) 21  Level of Consciousness Alert  SpO2 100 %  O2 Device Bi-PAP  FiO2 (%) 100 %  Assess: MEWS Score  MEWS Temp 0  MEWS Systolic 0  MEWS Pulse 0  MEWS RR 1  MEWS LOC 0  MEWS Score 1  MEWS Score Color Green  Assess: if the MEWS score is Yellow or Red  Were vital signs accurate and taken at a resting state? Yes  Does the patient meet 2 or more of the SIRS criteria? No  MEWS guidelines implemented  Yes, yellow  Treat  MEWS Interventions Considered administering scheduled or prn medications/treatments as ordered  Take Vital Signs  Increase Vital Sign Frequency  Yellow: Q2hr x1, continue Q4hrs until patient remains green for 12hrs  Escalate  MEWS: Escalate Yellow: Discuss with charge nurse and consider notifying provider and/or RRT  Notify: Charge Nurse/RN  Name of Charge Nurse/RN Notified Tanya, RN  Provider Notification  Provider Name/Title Dr. Joneen Roach (at bedside)  Assess: SIRS CRITERIA  SIRS Temperature  0  SIRS Pulse 0  SIRS Respirations  1  SIRS WBC 0  SIRS Score Sum  1

## 2023-07-21 NOTE — ED Provider Notes (Signed)
Called into the room for respiratory distress.  Patient has been admitted for treatment of possible necrotizing skin infection of the leg.  Receiving IV fluids and antibiotics.  Patient is tachypneic with audible wheezing.  Bibasilar rales.  Sats in the low 90s. On review of records, patient's last EF was 60 to 65%.  No history of COPD or asthma. Patient does have a history of A-fib not on anticoagulation.  Patient does have CKD. Appears patient was previously on Lasix.  Placed on supplemental oxygen, chest x-ray ordered.  BiPAP ordered.  Direct message to Dr. Margo Aye the admitting physician sent.  After , WOB improved.  Care upgraded to progressive bed.  Transfer to [employer].   Nira Conn, MD 07/21/23 [phone]

## 2023-07-21 NOTE — Progress Notes (Signed)
  Echocardiogram 2D Echocardiogram has been performed.  Carollee Herter O'Grady 07/21/2023, 10:00 AM

## 2023-07-21 NOTE — Consult Note (Addendum)
WOC Nurse Consult Note: patient with left lower leg wound that he has followed with primary care physician; has been on po antibiotics for weeks; sent to Emergency Room due to worsening; has seen ortho 07/20/2023 with no immediate plans for surgical intervention  Reason for Consult:L lower extremity wound  Wound type:1.  full thickness ? R/t venous insufficiency  2.  Full thickness L great toe, R 3rd and 4th toes  3.  Blood filled blister L 2nd toe  Pressure Injury POA: NA  Measurement: 1. Left lateral lower leg 11 cm x 4 cm 80% tan black necrotic tissue foul smelling 20% pink  2.  L great toe 2 cm x 2 cm 100 dry black eschar, L 2nd digit 1 cm x 1 cm blood filled blister; R 3rd and 4th digits eschar around nail beds and lateral aspect   Wound bed: as above  Drainage (amount, consistency, odor) foul smelling tan drainage from leg wound; other wounds dry  Periwound:erythema, scattered scabbed over ? Healed venous ulcers  Dressing procedure/placement/frequency:  Cleanse L lateral lower leg with Vashe wound cleanser Hart [place] [phone]), Apply 1/4" thick layer of Santyl to wound bed daily;  top with saline moist gauze, dry gauze, ABD pad, wrap leg with Kerlix beginning just above toes and ending right below knee. May secure with Ace bandaged wrapped in same fashion as Kerlix.   Paint all toe wounds with Betadine twice daily and allow to air dry.    POC discussed with bedside nurse. No family member present in room.    WOC team will not follow. Any wound care orders placed by orthopedics supercede WOC wound care orders.   Thank you,    Priscella Mann MSN, RN-BC, [employer] [phone]

## 2023-07-21 NOTE — Progress Notes (Addendum)
[facility]C[address] [facility] Liaison Note:  This is a current Midwife patient with [employer]. will continue to follow for discharge disposition. Please call with any Integrated Health Services related questions or concerns.  Thank you, Thea Gist, BSN [facility] liaison [phone]

## 2023-07-21 NOTE — Progress Notes (Signed)
PROGRESS NOTE  [place] W Lisa Roca.  DOB: August 14, 1941  PCP: Linus Galas, NP HQI:[id number]  DOA: 07/20/2023  LOS: 1 day  Hospital Day: 2  Brief narrative: Janne Napoleon. is a 82 y.o. male with PMH significant for obesity, HTN, HLD, CHF, permanent A-fib, CKD, PAD, carotid stenosis, gout, chronic left lateral leg wound for which he was getting wound care at home health. 10/30, patient was brought to [facility] by family for the concern of infection on the nonhealing left lateral leg wound.  Wound has been worsening for last 2 months.  He recently had a course of doxycycline despite which wound continued to worsen  In the ED, afebrile, blood pressure elevated to 170s Initial labs with WC count normal at 6.2, hemoglobin 12.1, BUN/creatinine 24/1.24 Blood culture sent. X-ray of left tibia-fibula showed diffuse soft tissue swelling of the left lower leg, with punctate subcutaneous gas distally and laterally which could reflect gas-forming infection or overlying ulceration. No evidence of fracture, osteomyelitis, or radiopaque foreign body.  Seen by orthopedic surgery, Dr. Thad Ranger.  Recommended IV antibiotics, local wound care, and transfer to [facility] for possible need of wound debridement if no improvement with antibiotics.   Started on IV Zosyn and IV vancomycin in the ED.   Admitted by [facility], hospitalist service.  Overnight, patient had an episode of choking while drinking water.  His O2 sat dropped and he was kept on BiPAP. Chest x-ray showed increased pulmonary vascularity. Blood gas was reassuring with pCO2 at 39 IV fluid was stopped. Patient was kept on BiPAP overnight and weaned off to nasal cannula this morning.   Subjective: Patient was seen and examined this morning.  Elderly white male.   At the time of my evaluation, patient was lying on bed.  Try to open eyes on command.  Family not at bedside. Overnight vital signs stable.  Blood pressure 139/76 this morning. I  checked with nursing staff this afternoon.  Patient is alert, awake and taking his lunch.  Assessment and plan: Infected chronic left lateral wound Currently on IV antibiotics with IV cefepime and Flagyl. No fever.  WBC count normal.  Lactic acid level normal. Orthopedic surgery and wound care following. Recent Labs  Lab 07/20/23 1639 07/20/23 2329 07/21/23 0043  WBC 6.2  --   --   LATICACIDVEN  --  1.0 1.0   Acute respiratory failure with hypoxia Aspiration Overnight, patient had an episode of choking while drinking water.  His O2 sat dropped and he was kept on BiPAP. Chest x-ray showed increased pulmonary vascularity. Blood gas was reassuring with pCO2 at 39 IV fluid was stopped. Patient was kept on BiPAP overnight and weaned off to nasal cannula this morning. Speech eval obtained.  Regular diet started  Chronic diastolic CHF Hypertension Blood pressure in 170s overnight, 130s this morning. PTA meds- Toprol 125 mg daily, Lasix 20 mg daily Resume Toprol.  Hold Lasix for now. Most recent echo from 2022 with EF 65 to 70% with severe concentric LVH IV hydralazine as needed to continue.  Permanent A-fib Currently rate controlled with Toprol. Per outpatient cardiology note, the patient had declined anticoagulation Continue to monitor on telemetry  PAD Pending ABI May benefit from antiplatelet.  Type 2 diabetes mellitus A1c 6.9 in 2021. Update A1c PTA meds-none Currently on SSI/Accu-Cheks Recent Labs  Lab 07/21/23 0732  GLUCAP 125*    CKD 3A Creatinine remains at baseline. Recent Labs    06/10/23 1630 07/20/23 1639  BUN 21  24*  CREATININE 1.59* 1.24   Chronic macrocytic anemia GERD Continue PPI Recent Labs    06/10/23 1630 07/20/23 1639  HGB 12.3* 12.1*  MCV 102* 101.7*   Left subconjunctival hemorrhage Likely due to elevated blood pressure.  Unable to examine vision impairment.  Patient was altered this morning CT scan orbits were obtained which was  unremarkable.  Gout As needed colchicine  Obesity  Body mass index is 32.43 kg/m. Patient has been advised to make an attempt to improve diet and exercise patterns to aid in weight loss.  Impaired mobility PT OT eval ordered.   Goals of care   Code Status: Limited: Do not attempt resuscitation (DNR) -DNR-LIMITED -Do Not Intubate/DNI      DVT prophylaxis:  heparin injection 5,000 Units Start: 07/21/23 0600   Antimicrobials: IV cefepime, IV Flagyl. Fluid: None Consultants: Orthopedics Family Communication:   Status: Inpatient Level of care:  Progressive   Patient is from: Home Needs to continue in-hospital care: Needs IV antibiotics Anticipated d/c to: Pending PT      Diet:  Diet Order             Diet Heart Room service appropriate? Yes; Fluid consistency: Thin  Diet effective now                   Scheduled Meds:  collagenase   Topical Daily   heparin  5,000 Units Subcutaneous Q8H   LORazepam  1 mg Intravenous Once   metoprolol succinate  125 mg Oral Daily   pantoprazole  40 mg Oral Daily    PRN meds: acetaminophen, hydrALAZINE, ipratropium-albuterol, melatonin, polyethylene glycol, prochlorperazine   Infusions:   ceFEPime (MAXIPIME) IV 2 g (07/21/23 1200)   metronidazole 500 mg (07/21/23 1201)    Antimicrobials: Anti-infectives (From admission, onward)    Start     Dose/Rate Route Frequency Ordered Stop   07/22/23 1200  vancomycin (VANCOREADY) IVPB 1250 mg/250 mL  Status:  Discontinued        1,000 mg 133.3 mL/hr over 90 Minutes Intravenous Every 36 hours 07/20/23 2354 07/21/23 0134   07/21/23 0000  metroNIDAZOLE (FLAGYL) IVPB 500 mg        500 mg 100 mL/hr over 60 Minutes Intravenous Every 12 hours 07/20/23 2345     07/21/23 0000  ceFEPIme (MAXIPIME) 2 g in sodium chloride 0.9 % 100 mL IVPB        2 g 200 mL/hr over 30 Minutes Intravenous Every 12 hours 07/20/23 2353     07/20/23 2100  vancomycin (VANCOCIN) IVPB 1000 mg/200 mL premix         1,000 mg 200 mL/hr over 60 Minutes Intravenous  Once 07/20/23 2056 07/21/23 0210   07/20/23 2100  piperacillin-tazobactam (ZOSYN) IVPB 3.375 g  Status:  Discontinued        3.375 g 100 mL/hr over 30 Minutes Intravenous  Once 07/20/23 2056 07/20/23 2353       Objective: Vitals:   07/21/23 1021 07/21/23 1100  BP: 133/63 131/62  Pulse: 65 66  Resp:  (!) 21  Temp:  98.4 F (36.9 C)  SpO2:  95%    Intake/Output Summary (Last 24 hours) at 07/21/2023 1346 Last data filed at 07/21/2023 1104 Gross per 24 hour  Intake 900.73 ml  Output 1950 ml  Net -1049.27 ml   Filed Weights   07/20/23 1615 07/21/23 0300  Weight: 88.5 kg 85.7 kg   Weight change:  Body mass index is 32.43 kg/m.   Physical  Exam: General exam: Pleasant, elderly Caucasian male.  Not in distress Skin: No rashes, lesions or ulcers. HEENT: Atraumatic, normocephalic, no obvious bleeding Lungs: Clear to auscultation bilaterally CVS: Regular rate and rhythm, no murmur GI/Abd soft, nontender, nondistended, bowels are present CNS: Earlier in the morning, he was somnolent.  This afternoon he is alert, awake Psychiatry: Mood appropriate Extremities: Regular, no calf tenderness.  Left leg wound remains on wrap bandage.  Data Review: I have personally reviewed the laboratory data and studies available.  F/u labs ordered Unresulted Labs (From admission, onward)     Start     Ordered   07/22/23 0500  Basic metabolic panel  Daily,   R     Comments: As Scheduled for 5 days   Question:  Specimen collection method  Answer:  Lab=Lab collect   07/21/23 0605   07/21/23 1341  Hemoglobin A1c  Add-on,   AD       Question:  Specimen collection method  Answer:  Lab=Lab collect   07/21/23 1340   07/21/23 0304  Brain natriuretic peptide  Add-on,   AD        07/21/23 0303            Total time spent in review of labs and imaging, patient evaluation, formulation of plan, documentation and communication with family: 55  minutes  Signed, Lorin Glass, MD [facility] 07/21/2023

## 2023-07-21 NOTE — Progress Notes (Signed)
Heart Failure Navigator Progress Note  Assessed for Heart & Vascular TOC clinic readiness.  Patient does not meet criteria due to more Chronic CHF/euvolemic on MD exam.   Navigator will sign of fat this time.  Roxy Horseman, RN, BSN [facility] Heart Failure Navigator Secure Chat Only

## 2023-07-21 NOTE — ED Notes (Signed)
Pt began showing signs of respiratory distress. Pt work of breathing became more noticeable and wheezing was heard at the doorway. Pt had no wheezing upon arrival to the ED. Dr. Eudelia Bunch assessed patient and Dr Margo Aye was made aware of the change.  Upon [facility] arrival, pt was attempting to get out the bed and come out of the mask. After attempting to explain to pt the necessity of the BiPaP, pt was put on a non-rebreather at 15 lpm. Pt. O2 remained at 100% after several minutes of monitoring him. Patient was moved to [facility] for transport to [place].

## 2023-07-21 NOTE — Progress Notes (Signed)
PT Cancellation Note  Patient Details Name: [name] MRN: [id number] DOB: 06-20-1941   Cancelled Treatment:    Reason Eval/Treat Not Completed: Other (comment) (Family left pt in asleep).  Will see 11/1 as able. 07/21/2023  Jacinto Halim., PT Acute Rehabilitation Services [phone]  (office)   Eliseo Gum Rowland [name] 07/21/2023, 6:48 PM

## 2023-07-21 NOTE — Progress Notes (Addendum)
Patient seen on presentation to the unit.  Still in respiratory distress with use of accessory muscles on nonrebreather.  0.5 Ativan given, BiPAP initiated.  IV focused. CHF order set added.  ABG reviewed, reassuring.  Continue BiPAP for work of breathing. Patient current antibiotics cover aspiration pneumonia  Left eye with conjunctival hemorrhage with serosanguineous drainage.  Per wife patient had a busted blood vessel, then accidentally poked his finger in his eye.  CT orbit ordered
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
## 2023-07-21 NOTE — Evaluation (Signed)
Occupational Therapy Evaluation Patient Details Name: [name] MRN: [id number] DOB: 1940-10-04 Today's Date: 07/21/2023   History of Present Illness Pt is an 82 y/o male presenting with concerns of L leg wound infection. Orthopedics recommended IV antibiotics, local wound care and transfer to [facility] for possible debridement. Acute stay complicated by respiratory distress requiring BiPAP. PMH: migraines, anemia, HTN, gout, GERD, chronic HFpEF, a fib, CKD3   Clinical Impression   PTA, pt lives with spouse, reports not using AD for mobility and able to manage ADLs. Difficult to obtain full PLOF/home setup due to pt's hearing impairment and unable to reach spouse via phone. Overall, pt requires Min to intermittent Mod A for safe mobility without AD w/ constant hands on assist needed to maintain balance. Would benefit from encouragement to try RW during next session. Pt requires Min A for UB ADL and Mod-Max A for LB ADLs d/t current deficits. Based on current presentation, recommend consideration of continued inpatient follow up therapy, <3 hours/day at DC (unless spouse able to provide the currently needed physical assistance).       If plan is discharge home, recommend the following: A lot of help with walking and/or transfers;A lot of help with bathing/dressing/bathroom    Functional Status Assessment  Patient has had a recent decline in their functional status and demonstrates the ability to make significant improvements in function in a reasonable and predictable amount of time.  Equipment Recommendations  Other (comment) (TBD pending progress)    Recommendations for Other Services       Precautions / Restrictions Precautions Precautions: Fall;Other (comment) Precaution Comments: wounds on B feet Restrictions Weight Bearing Restrictions: No      Mobility Bed Mobility               General bed mobility comments: in chair on entry    Transfers Overall transfer  level: Needs assistance Equipment used: None, 1 person hand held assist Transfers: Sit to/from Stand Sit to Stand: Min assist           General transfer comment: Steadying assist without AD, slow to rise      Balance Overall balance assessment: Needs assistance Sitting-balance support: No upper extremity supported, Feet supported Sitting balance-Leahy Scale: Fair     Standing balance support: No upper extremity supported, During functional activity Standing balance-Leahy Scale: Poor Standing balance comment: briefly able to stand statically but reaching out for support without AD                           ADL either performed or assessed with clinical judgement   ADL Overall ADL's : Needs assistance/impaired Eating/Feeding: NPO Eating/Feeding Details (indicate cue type and reason): NPO per orders but anticipate no more than Setup assist needed for self feeding Grooming: Minimal assistance;Standing;Wash/dry face Grooming Details (indicate cue type and reason): Min A for balance and consistent cues for sequencing and problem solving Upper Body Bathing: Minimal assistance;Sitting   Lower Body Bathing: Moderate assistance;Sit to/from stand;Sitting/lateral leans   Upper Body Dressing : Minimal assistance;Sitting   Lower Body Dressing: Sitting/lateral leans;Sit to/from stand;Maximal assistance Lower Body Dressing Details (indicate cue type and reason): assist for socks d/t bandaging and noted wounds on B toes Toilet Transfer: Minimal assistance;Moderate assistance;Ambulation   Toileting- Clothing Manipulation and Hygiene: Moderate assistance;Sitting/lateral lean;Sit to/from stand       Functional mobility during ADLs: Minimal assistance;Moderate assistance General ADL Comments: pt reported no need for RW,  trial in room without AD and consistent Min-Mod A needed for balance and handheld assist. unsteadiness in standing increasing fall risk     Vision Baseline  Vision/History: 1 Wears glasses Ability to See in Adequate Light: 0 Adequate Patient Visual Report: Other (comment) (bursted blood vessel per chart w/ blood around L eye) Vision Assessment?: No apparent visual deficits Additional Comments: pt reports no issues with vision in L eye despite busted blood vessel. reports he wasnt even sure which eye was impaired because vision is fine     Perception         Praxis         Pertinent Vitals/Pain Pain Assessment Pain Assessment: No/denies pain     Extremity/Trunk Assessment Upper Extremity Assessment Upper Extremity Assessment: Generalized weakness;Right hand dominant   Lower Extremity Assessment Lower Extremity Assessment: Defer to PT evaluation   Cervical / Trunk Assessment Cervical / Trunk Assessment: Kyphotic   Communication Communication Communication: Hearing impairment;Other (comment) (says he has hearing aides but has never worn them)   Cognition Arousal: Alert Behavior During Therapy: WFL for tasks assessed/performed Overall Cognitive Status: Difficult to assess                                 General Comments: very HOH so difficult to fully assess cognition. able to follow directions but decreased insight into deficits and assist currently needed     General Comments  SpO2 > 95%, 2/4 DOE noted. HR WFL    Exercises     Shoulder Instructions      Home Living Family/patient expects to be discharged to:: Private residence Living Arrangements: Spouse/significant other Available Help at Discharge: Family;Available 24 hours/day Type of Home: House       Home Layout: Two level;Able to live on main level with bedroom/bathroom (says he has been staying on first floor in living room)               Home Equipment: Shower Counsellor (2 wheels);Cane - single point          Prior Functioning/Environment Prior Level of Function : Independent/Modified Independent             Mobility  Comments: reports no AD for mobility, does not like to use AD ADLs Comments: Reports able to manage showers, other ADLs.        OT Problem List: Decreased activity tolerance;Impaired balance (sitting and/or standing);Decreased cognition;Decreased safety awareness;Decreased knowledge of use of DME or AE;Decreased strength      OT Treatment/Interventions: Self-care/ADL training;Therapeutic exercise;Energy conservation;DME and/or AE instruction;Therapeutic activities;Patient/family education;Balance training    OT Goals(Current goals can be found in the care plan section) Acute Rehab OT Goals Patient Stated Goal: none specifically stated; asking for water (NPO per orders) OT Goal Formulation: With patient Time For Goal Achievement: 08/04/23 Potential to Achieve Goals: Good ADL Goals Pt Will Perform Lower Body Bathing: with contact guard assist;sit to/from stand;sitting/lateral leans Pt Will Perform Lower Body Dressing: with contact guard assist;sit to/from stand;sitting/lateral leans Pt Will Transfer to Toilet: with supervision;ambulating  OT Frequency: Min 1X/week    Co-evaluation              AM-PAC OT "6 Clicks" Daily Activity     Outcome Measure Help from another person eating meals?: A Little Help from another person taking care of personal grooming?: A Little Help from another person toileting, which includes using toliet, bedpan, or urinal?: A  Lot Help from another person bathing (including washing, rinsing, drying)?: A Lot Help from another person to put on and taking off regular upper body clothing?: A Little Help from another person to put on and taking off regular lower body clothing?: A Lot 6 Click Score: 15   End of Session Equipment Utilized During Treatment: Gait belt Nurse Communication: Mobility status;Other (comment) (request for water)  Activity Tolerance: Patient tolerated treatment well Patient left: in chair;with call bell/phone within reach;with chair  alarm set  OT Visit Diagnosis: Unsteadiness on feet (R26.81);Other abnormalities of gait and mobility (R26.89);Muscle weakness (generalized) (M62.81)                Time: 6578-4696 OT Time Calculation (min): 18 min Charges:  OT General Charges $OT Visit: 1 Visit OT Evaluation $OT Eval Moderate Complexity: 1 Mod  Bradd Canary, OTR/L Acute Rehab Services Office: [phone]   Lorre Munroe 07/21/2023, 8:59 AM
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
## 2023-07-21 NOTE — Progress Notes (Addendum)
Received a call from bedside regarding the patient choking after drinking water.   Presented at bedside, the patient was already on BiPAP.  Per the patient's niece at bedside, the patient was laying supine when drinking his water and choked up on it.  Personally reviewed chest x-ray done due to this event.  Increase pulmonary vascularity noted on chest x-ray, IV fluid discontinued, IV vancomycin held.  O2 saturation 100% on BiPAP, 12/6 with FiO2 of 40%.  VBG ordered and is pending.  No prior history of hypoxia, not on CPAP prior to admission.  Per his wife no history of dysphagia to solids or liquids.  On exam, the patient is laying comfortably on BiPAP, no use of accessory muscle at this time.  Diffuse wheezing noted on lung auscultation.  Tachypnea has improved.  Trace lower extremity edema.  Strict n.p.o. while on BiPAP.  Speech therapist evaluation.  Continue IV cefepime and IV Flagyl.  As needed DuoNebs for wheezing.  Aspiration precautions.  Bed request changed to progressive care unit at [facility].  Updated the patient's wife and niece at bedside.  Will continue to closely monitor and treat as indicated.  Critical care time: 30 minutes.

## 2023-07-22 DIAGNOSIS — S81802A Unspecified open wound, left lower leg, initial encounter: Secondary | ICD-10-CM | POA: Diagnosis not present

## 2023-07-22 LAB — BASIC METABOLIC PANEL
Anion gap: 14 (ref 5–15)
BUN: 25 mg/dL — ABNORMAL HIGH (ref 8–23)
CO2: 25 mmol/L (ref 22–32)
Calcium: 8.8 mg/dL — ABNORMAL LOW (ref 8.9–10.3)
Chloride: 99 mmol/L (ref 98–111)
Creatinine, Ser: 1.68 mg/dL — ABNORMAL HIGH (ref 0.61–1.24)
GFR, Estimated: 41 mL/min — ABNORMAL LOW (ref >60)
Glucose, Bld: 118 mg/dL — ABNORMAL HIGH (ref 70–99)
Potassium: 3 mmol/L — ABNORMAL LOW (ref 3.5–5.1)
Sodium: 138 mmol/L (ref 135–145)

## 2023-07-22 LAB — GLUCOSE, CAPILLARY
Glucose-Capillary: 109 mg/dL — ABNORMAL HIGH (ref 70–99)
Glucose-Capillary: 112 mg/dL — ABNORMAL HIGH (ref 70–99)
Glucose-Capillary: 107 mg/dL — ABNORMAL HIGH (ref 70–99)

## 2023-07-22 LAB — VAS US ABI WITH/WO TBI: Right ABI: 0.97

## 2023-07-22 MED ORDER — SODIUM CHLORIDE 0.9 % IV SOLN
2.0000 g | INTRAVENOUS | Status: DC
  Administered 2023-07-22 – 2023-07-23 (×2): 2 g via INTRAVENOUS
  Filled 2023-07-22 (×2): qty 20

## 2023-07-22 NOTE — Progress Notes (Signed)
PROGRESS NOTE  Beckett W Lisa Roca.  DOB: 04/17/1941  PCP: Linus Galas, NP VHQ:[id number]  DOA: 07/20/2023  LOS: 2 days  Hospital Day: 3  Brief narrative: Janne Napoleon. is a 82 y.o. male with PMH significant for obesity, HTN, HLD, CHF, permanent A-fib, CKD, PAD, carotid stenosis, gout, chronic left lateral leg wound for which he was getting wound care at home health. 10/30, patient was brought to [facility] by family for the concern of infection on the nonhealing left lateral leg wound.  Wound has been worsening for last 2 months.  He recently had a course of doxycycline despite which wound continued to worsen  In the ED, afebrile, blood pressure elevated to 170s Initial labs with WC count normal at 6.2, hemoglobin 12.1, BUN/creatinine 24/1.24 Blood culture sent. X-ray of left tibia-fibula showed diffuse soft tissue swelling of the left lower leg, with punctate subcutaneous gas distally and laterally which could reflect gas-forming infection or overlying ulceration. No evidence of fracture, osteomyelitis, or radiopaque foreign body.  Seen by orthopedic surgery, Dr. Thad Ranger.  Recommended IV antibiotics, local wound care, and transfer to [facility] for possible need of wound debridement if no improvement with antibiotics.   Started on IV Zosyn and IV vancomycin in the ED.   Admitted by [facility], hospitalist service.  Overnight, patient had an episode of choking while drinking water.  His O2 sat dropped and he was kept on BiPAP. Chest x-ray showed increased pulmonary vascularity. Blood gas was reassuring with pCO2 at 39 IV fluid was stopped. Patient was kept on BiPAP overnight and weaned off to nasal cannula this morning.   Subjective: Patient was seen and examined this morning.  Pleasant elderly Caucasian male.  Lying on bed.  Hard of hearing.  Alert, awake, able to answer orientation questions.  Wife and other family member at bedside.  Assessment and plan: Infected chronic left  lateral wound Left toe wounds X-ray left tibia/fibula showed soft tissue swelling, ulcer without involvement of bone.   Seen by orthopedics.  Recommended medical management with antibiotics and wound care.   Currently on IV antibiotics with IV cefepime and Flagyl. Discussed with pharmacist.  Will switch IV cefepime to IV Rocephin.  Continue Flagyl for now. No fever.  WBC count normal.  Lactic acid level normal. Orthopedic surgery and wound care following. Recent Labs  Lab 07/20/23 1639 07/20/23 2329 07/21/23 0043  WBC 6.2  --   --   LATICACIDVEN  --  1.0 1.0   Abnormal left toe brachial index I requested a vascular consult today given nonhealing wounds of left leg and toes and the findings of abnormal left toe brachial index  Acute respiratory failure with hypoxia Aspiration Overnight, patient had an episode of choking while drinking water.  His O2 sat dropped and he was kept on BiPAP. Chest x-ray showed increased pulmonary vascularity. Blood gas was reassuring with pCO2 at 39 IV fluid was stopped. Currently patient is on nasal cannula during the day and BiPAP in the night. Speech eval obtained.  Regular diet started  Chronic diastolic CHF Hypertension Blood pressure in 170s overnight, 130s this morning. PTA meds- Toprol 125 mg daily, Lasix 20 mg daily as needed.  Per wife, patient does not like to take Lasix and has not taken it in several days. Currently continued on Toprol only. Most recent echo from 2022 with EF 65 to 70% with severe concentric LVH IV hydralazine as needed to continue. Follows up with Dr. Carolanne Grumbling as an  outpatient  Permanent A-fib Currently rate controlled with Toprol. Per outpatient cardiology note, the patient had declined anticoagulation Continue to monitor on telemetry  Type 2 diabetes mellitus A1c 6.9 in 2021. Update A1c PTA meds-none Currently on SSI/Accu-Cheks Recent Labs  Lab 07/21/23 0732 07/21/23 1723 07/22/23 0741 07/22/23 1054   GLUCAP 125* 128* 109* 112*    CKD 3A Creatinine remains at baseline. Recent Labs    06/10/23 1630 07/20/23 1639 07/22/23 0225  BUN 21 24* 25*  CREATININE 1.59* 1.24 1.68*   Chronic macrocytic anemia GERD Continue PPI Recent Labs    06/10/23 1630 07/20/23 1639  HGB 12.3* 12.1*  MCV 102* 101.7*   Left subconjunctival hemorrhage Likely due to elevated blood pressure.  Unable to examine vision impairment.  Patient was altered this morning CT scan orbits were obtained which was unremarkable.  Gout As needed colchicine  Obesity  Body mass index is 31.94 kg/m. Patient has been advised to make an attempt to improve diet and exercise patterns to aid in weight loss.  Impaired mobility PT OT eval ordered.   Goals of care   Code Status: Limited: Do not attempt resuscitation (DNR) -DNR-LIMITED -Do Not Intubate/DNI      DVT prophylaxis:  heparin injection 5,000 Units Start: 07/21/23 0600   Antimicrobials: IV Rocephin and IV Flagyl Fluid: None Consultants: Orthopedics Family Communication:   Status: Inpatient Level of care:  Progressive   Patient is from: Home Needs to continue in-hospital care: Needs IV antibiotics Anticipated d/c to: Pending PT      Diet:  Diet Order             Diet Heart Room service appropriate? Yes; Fluid consistency: Thin  Diet effective now                   Scheduled Meds:  collagenase   Topical Daily   heparin  5,000 Units Subcutaneous Q8H   LORazepam  1 mg Intravenous Once   metoprolol succinate  125 mg Oral Daily   pantoprazole  40 mg Oral Daily    PRN meds: acetaminophen, hydrALAZINE, ipratropium-albuterol, melatonin, mouth rinse, polyethylene glycol, prochlorperazine   Infusions:   cefTRIAXone (ROCEPHIN)  IV 2 g (07/22/23 1155)   metronidazole 500 mg (07/22/23 1155)    Antimicrobials: Anti-infectives (From admission, onward)    Start     Dose/Rate Route Frequency Ordered Stop   07/22/23 1215  cefTRIAXone  (ROCEPHIN) 2 g in sodium chloride 0.9 % 100 mL IVPB        2 g 200 mL/hr over 30 Minutes Intravenous Every 24 hours 07/22/23 1116     07/22/23 1200  vancomycin (VANCOREADY) IVPB 1250 mg/250 mL  Status:  Discontinued        1,000 mg 133.3 mL/hr over 90 Minutes Intravenous Every 36 hours 07/20/23 2354 07/21/23 0134   07/21/23 0000  metroNIDAZOLE (FLAGYL) IVPB 500 mg        500 mg 100 mL/hr over 60 Minutes Intravenous Every 12 hours 07/20/23 2345     07/21/23 0000  ceFEPIme (MAXIPIME) 2 g in sodium chloride 0.9 % 100 mL IVPB  Status:  Discontinued        2 g 200 mL/hr over 30 Minutes Intravenous Every 12 hours 07/20/23 2353 07/22/23 1116   07/20/23 2100  vancomycin (VANCOCIN) IVPB 1000 mg/200 mL premix        1,000 mg 200 mL/hr over 60 Minutes Intravenous  Once 07/20/23 2056 07/21/23 0210   07/20/23 2100  piperacillin-tazobactam (ZOSYN)  IVPB 3.375 g  Status:  Discontinued        3.375 g 100 mL/hr over 30 Minutes Intravenous  Once 07/20/23 2056 07/20/23 2353       Objective: Vitals:   07/22/23 0826 07/22/23 1105  BP: 131/75 112/65  Pulse: 63 (!) 58  Resp:  20  Temp:  98.2 F (36.8 C)  SpO2:  97%    Intake/Output Summary (Last 24 hours) at 07/22/2023 1329 Last data filed at 07/22/2023 0453 Gross per 24 hour  Intake 399.27 ml  Output 1500 ml  Net -1100.73 ml   Filed Weights   07/20/23 1615 07/21/23 0300 07/22/23 0421  Weight: 88.5 kg 85.7 kg 84.4 kg   Weight change: -4.051 kg Body mass index is 31.94 kg/m.   Physical Exam: General exam: Pleasant, elderly Caucasian male.  Not in distress Skin: No rashes, lesions or ulcers. HEENT: Atraumatic, normocephalic, no obvious bleeding Lungs: Clear to auscultation bilaterally CVS: Regular rate and rhythm, no murmur GI/Abd soft, nontender, nondistended, bowels are present CNS: Earlier in the morning, he was somnolent.  This afternoon he is alert, awake Psychiatry: Mood appropriate Extremities: Regular, no calf tenderness.  Left  leg wound remains on wrap bandage.  Data Review: I have personally reviewed the laboratory data and studies available.  F/u labs ordered Unresulted Labs (From admission, onward)     Start     Ordered   07/22/23 0500  Basic metabolic panel  Daily,   R     Comments: As Scheduled for 5 days   Question:  Specimen collection method  Answer:  Lab=Lab collect   07/21/23 0605   07/21/23 1341  Hemoglobin A1c  Add-on,   AD       Question:  Specimen collection method  Answer:  Lab=Lab collect   07/21/23 1340   07/21/23 0304  Brain natriuretic peptide  Add-on,   AD        07/21/23 0303            Total time spent in review of labs and imaging, patient evaluation, formulation of plan, documentation and communication with family: 45 minutes  Signed, Lorin Glass, MD [facility] 07/22/2023

## 2023-07-22 NOTE — Evaluation (Signed)
Physical Therapy Evaluation Patient Details Name: [name] MRN: [id number] DOB: Jul 10, 1941 Today's Date: 07/22/2023  History of Present Illness  Pt is an 82 y/o male presenting with concerns of L leg wound infection. Orthopedics recommended IV antibiotics, local wound care and transfer to [facility] for possible debridement. Acute stay complicated by respiratory distress requiring BiPAP. PMH: migraines, anemia, HTN, gout, GERD, chronic HFpEF, a fib, CKD3  Clinical Impression  Patient presents with decreased mobility due to generalized weakness and pain in L LE.  At baseline able to ambulate in the home without device though with 3 recent falls per wife.  Patient normally can complete BADL though she states takes him a long time so sometimes she helps for efficiency.  Patient able to ambulate in hallway with CGA with RW progressing to S.  Noted antalgia on L LE though pt denies pain (only reports "stiffness").  Patient will benefit from skilled PT in the acute setting and from follow up HHPT at d/c.         If plan is discharge home, recommend the following: A little help with walking and/or transfers;Assistance with cooking/housework;Assist for transportation;Help with stairs or ramp for entrance;A little help with bathing/dressing/bathroom   Can travel by private vehicle        Equipment Recommendations None recommended by PT  Recommendations for Other Services       Functional Status Assessment Patient has had a recent decline in their functional status and demonstrates the ability to make significant improvements in function in a reasonable and predictable amount of time.     Precautions / Restrictions Precautions Precautions: Fall Precaution Comments: wounds on B feet      Mobility  Bed Mobility Overal bed mobility: Modified Independent                  Transfers Overall transfer level: Needs assistance Equipment used: Rolling walker (2 wheels) Transfers: Sit  to/from Stand Sit to Stand: Min assist           General transfer comment: assist for anterior weight shift, pt pulling up on walker initially, demonstrated pushing up from bed and pt performed    Ambulation/Gait Ambulation/Gait assistance: Supervision, Contact guard assist Gait Distance (Feet): 250 Feet Assistive device: Rolling walker (2 wheels) Gait Pattern/deviations: Step-to pattern, Step-through pattern, Decreased stride length, Antalgic, Decreased stance time - left       General Gait Details: mild limp on L pt reports due to "stiffness" though reported better after walking a bit though still with limp; manages walker well on turns, in small space near bed pt lifting to move walker over his wife's feet while PT arranging bed  Stairs            Wheelchair Mobility     Tilt Bed    Modified Rankin (Stroke Patients Only)       Balance Overall balance assessment: Needs assistance Sitting-balance support: Feet supported, No upper extremity supported Sitting balance-Leahy Scale: Good     Standing balance support: No upper extremity supported Standing balance-Leahy Scale: Fair Standing balance comment: lifting walker to move over obstacles in the room without LOB                             Pertinent Vitals/Pain Pain Assessment Pain Assessment: Faces Faces Pain Scale: Hurts a little bit Pain Location: c/o L leg "stiffness" Pain Descriptors / Indicators: Discomfort, Tightness Pain Intervention(s): Monitored during session,  Repositioned    Home Living Family/patient expects to be discharged to:: Private residence Living Arrangements: Spouse/significant other Available Help at Discharge: Family;Available 24 hours/day Type of Home: House Home Access: Stairs to enter Entrance Stairs-Rails: Left Entrance Stairs-Number of Steps: 2   Home Layout: Two level;Able to live on main level with bedroom/bathroom (staying on main floor) Home Equipment:  Shower seat;Rolling Walker (2 wheels);Cane - single point      Prior Function Prior Level of Function : Independent/Modified Independent             Mobility Comments: typically walks without device, though three recent falls ADLs Comments: does his own ADL's but takes lots of time per wife and she helps him when needed     Extremity/Trunk Assessment   Upper Extremity Assessment Upper Extremity Assessment: Defer to OT evaluation    Lower Extremity Assessment Lower Extremity Assessment: RLE deficits/detail;LLE deficits/detail RLE Deficits / Details: AROM WFL, generalized weakness; noted some errythema and skin changes likely from PAD/venous stasis and cellulitic infection LLE Deficits / Details: AROM WFL, generalized weakness; kerlix wrap on lower leg and anchored on foot; also with scrape on his knee and blisters on tips of toes wife reports from tripping on steps onto concrete recently    Cervical / Trunk Assessment Cervical / Trunk Assessment: Kyphotic  Communication   Communication Communication: Hearing impairment  Cognition Arousal: Alert Behavior During Therapy: WFL for tasks assessed/performed Overall Cognitive Status: History of cognitive impairments - at baseline                                 General Comments: Wife reports pt at his baseline, seems alert and oriented to situation, though states he talks loud since he figures no one else can hear like him...        General Comments General comments (skin integrity, edema, etc.): SpO2 90% or greater on RA, HR 70's throughout; wife present and supportive    Exercises     Assessment/Plan    PT Assessment Patient needs continued PT services  PT Problem List Decreased strength;Decreased balance;Decreased mobility;Decreased knowledge of use of DME       PT Treatment Interventions      PT Goals (Current goals can be found in the Care Plan section)  Acute Rehab PT Goals Patient Stated Goal:  return home, not fall PT Goal Formulation: With patient/family Time For Goal Achievement: 08/05/23 Potential to Achieve Goals: Good    Frequency Min 1X/week     Co-evaluation               AM-PAC PT "6 Clicks" Mobility  Outcome Measure Help needed turning from your back to your side while in a flat bed without using bedrails?: None Help needed moving from lying on your back to sitting on the side of a flat bed without using bedrails?: None Help needed moving to and from a bed to a chair (including a wheelchair)?: A Little Help needed standing up from a chair using your arms (e.g., wheelchair or bedside chair)?: A Little Help needed to walk in hospital room?: A Little Help needed climbing 3-5 steps with a railing? : Total 6 Click Score: 18    End of Session Equipment Utilized During Treatment: Gait belt Activity Tolerance: Patient tolerated treatment well Patient left: with call bell/phone within reach;with family/visitor present;in bed   PT Visit Diagnosis: Other abnormalities of gait and mobility (R26.89);Muscle weakness (generalized) (  M62.81)    Time: 4098-1191 PT Time Calculation (min) (ACUTE ONLY): 27 min   Charges:   PT Evaluation $PT Eval Moderate Complexity: 1 Mod PT Treatments $Gait Training: 8-22 mins PT General Charges $$ ACUTE PT VISIT: 1 Visit         Sheran Lawless, PT Acute Rehabilitation Services Office:[phone] 07/22/2023   Elray Mcgregor 07/22/2023, 3:44 PM

## 2023-07-22 NOTE — TOC Initial Note (Addendum)
Transition of Care (TOC) - Initial/Assessment Note    Patient Details  Name: [name] MRN: [id number] Date of Birth: 06-07-1941  Transition of Care [facility]) CM/SW Contact:    Leone Haven, RN Phone Number: 07/22/2023, 2:36 PM  Clinical Narrative:                 From home with spouse, has PCP and insurance on file, wife states he has [facility] who does his dressings changes with [facility]- ( integrated services). Wife asked if she could get an aide added to services, NCM will ask Shawn with [facility]. He has a walker and a rollator and a shower stool.  He does not use the walkers.   States family member will transport them home at [employer] and family is support system, states gets medications from [facility].  Pta self ambulatory .  Per patient and wife , awaiting physical therapy recs but does not want to go to a SNF they rather go home.   Per Shawn with [facility], they do have an aide that can help him with bathing as well.  Expected Discharge Plan: Home w Home Health Services Barriers to Discharge: Continued Medical Work up   Patient Goals and CMS Choice Patient states their goals for this hospitalization and ongoing recovery are:: return home with wife   Choice offered to / list presented to : NA      Expected Discharge Plan and Services In-house Referral: NA Discharge Planning Services: CM Consult Post Acute Care Choice: Resumption of Svcs/PTA Provider, Home Health Living arrangements for the past 2 months: Single Family Home                 DME Arranged: N/A DME Agency: NA       HH Arranged: RN HH Agency:  Freight forwarder) Date HH Agency Contacted: 07/22/23 Time HH Agency Contacted: 1435 Representative spoke with at [facility] Agency: Shawn - for integrated services  Prior Living Arrangements/Services Living arrangements for the past 2 months: Single Family Home Lives with:: Spouse Patient language and need for interpreter reviewed:: Yes Do you feel safe  going back to the place where you live?: Yes      Need for Family Participation in Patient Care: Yes (Comment) Care giver support system in place?: Yes (comment)   Criminal Activity/Legal Involvement Pertinent to Current Situation/Hospitalization: No - Comment as needed  Activities of Daily Living   ADL Screening (condition at time of admission) Independently performs ADLs?: No Does the patient have a NEW difficulty with bathing/dressing/toileting/self-feeding that is expected to last >3 days?: Yes (Initiates electronic notice to provider for possible OT consult) Does the patient have a NEW difficulty with getting in/out of bed, walking, or climbing stairs that is expected to last >3 days?: Yes (Initiates electronic notice to provider for possible PT consult) Does the patient have a NEW difficulty with communication that is expected to last >3 days?: No Is the patient deaf or have difficulty hearing?: Yes Does the patient have difficulty seeing, even when wearing glasses/contacts?: No Does the patient have difficulty concentrating, remembering, or making decisions?: No  Permission Sought/Granted Permission sought to share information with : Case Manager Permission granted to share information with : Yes, Verbal Permission Granted              Emotional Assessment Appearance:: Appears stated age Attitude/Demeanor/Rapport: Engaged Affect (typically observed): Appropriate Orientation: : Oriented to Self, Oriented to Place, Oriented to  Time, Oriented to Situation Alcohol /  Substance Use: Not Applicable Psych Involvement: No (comment)  Admission diagnosis:  Leg wound, left [S81.802A] Left leg cellulitis [L03.116] Leg wound, left, initial encounter [S81.802A] Patient Active Problem List   Diagnosis Date Noted   Leg wound, left 07/20/2023   Acute on chronic congestive heart failure (HCC) 02/26/2021   Syncope 02/28/2020   Left foot pain 02/28/2020   Obesity (BMI 30.0-34.9)  02/28/2020   Carotid stenosis    HLD (hyperlipidemia)    Benign prostatic hyperplasia 11/07/2015   Benign localized hyperplasia of prostate with urinary obstruction 07/21/2015   Sepsis secondary to UTI (HCC) 04/23/2015   Hematuria 04/19/2015   Gram-negative bacteremia 04/15/2015   AKI (acute kidney injury) (HCC) 04/15/2015   Acute renal failure (HCC)    Atrial fibrillation with RVR (HCC) 04/14/2015   CAP (community acquired pneumonia) 04/14/2015   Essential hypertension 04/14/2015   Sepsis (HCC) 04/14/2015   Elevated troponin 04/14/2015   Normocytic hypochromic anemia 04/14/2015   CKD (chronic kidney disease) stage 4, GFR 15-29 ml/min (HCC) 04/14/2015   PCP:  Linus Galas, NP Pharmacy:   [id number] - [place] ([place]), [place] - [address] [place] ([place]) [place] [address] Phone: [phone] Fax: [phone]     Social Determinants of Health (SDOH) Social History: SDOH Screenings   Food Insecurity: No Food Insecurity (07/21/2023)  Housing: Low Risk  (07/21/2023)  Transportation Needs: No Transportation Needs (07/21/2023)  Utilities: Not At Risk (07/21/2023)  Depression (PHQ2-9): Low Risk  (03/17/2021)  Tobacco Use: Low Risk  (07/20/2023)   SDOH Interventions:     Readmission Risk Interventions     No data to display

## 2023-07-22 NOTE — Plan of Care (Signed)
?  Problem: Clinical Measurements: ?Goal: Ability to maintain clinical measurements within normal limits will improve ?Outcome: Progressing ?Goal: Will remain free from infection ?Outcome: Progressing ?Goal: Diagnostic test results will improve ?Outcome: Progressing ?  ?

## 2023-07-22 NOTE — Progress Notes (Signed)
Orthopaedic Surgery Progress Note  NAE. Eating dinner with family. Feels like leg wounds improving with ABX and wound care  Exam: Left lower extremity: There is a lateral wound all over the lateral compartment of the leg with fibrinous tissue as well as venous stasis changes on his bilateral legs Foot is warm and well-perfused with nonpalpable DP and PT pulses Intact motor EHL FHL  Assessment: 82 y.o. male with chronic left leg wound  Plan: Weightbearing as tolerated left lower extremity Agree with wound care recs and dressing changes No surgical plans presently Antibiotics per primary team, blood cultures pending Recommend outpatient follow up with wound care or Dr. Lajoyce Corners for further management

## 2023-07-22 NOTE — Progress Notes (Signed)
   07/22/23 0058  BiPAP/CPAP/SIPAP  BiPAP/CPAP/SIPAP Pt Type Adult  BiPAP/CPAP/SIPAP V60  Mask Type Full face mask  Mask Size Medium  Set Rate 12 breaths/min  Respiratory Rate 17 breaths/min  IPAP 12 cmH20  EPAP 6 cmH2O  FiO2 (%) 40 %  Minute Ventilation 8.1  Leak 18  Peak Inspiratory Pressure (PIP) 12  Tidal Volume (Vt) 488  Patient Home Equipment No  Press High Alarm 25 cmH2O  Press Low Alarm 5 cmH2O

## 2023-07-23 DIAGNOSIS — S81802A Unspecified open wound, left lower leg, initial encounter: Secondary | ICD-10-CM

## 2023-07-23 LAB — BASIC METABOLIC PANEL
Anion gap: 10 (ref 5–15)
BUN: 35 mg/dL — ABNORMAL HIGH (ref 8–23)
CO2: 25 mmol/L (ref 22–32)
Calcium: 8.6 mg/dL — ABNORMAL LOW (ref 8.9–10.3)
Chloride: 102 mmol/L (ref 98–111)
Creatinine, Ser: 1.57 mg/dL — ABNORMAL HIGH (ref 0.61–1.24)
GFR, Estimated: 44 mL/min — ABNORMAL LOW (ref >60)
Glucose, Bld: 104 mg/dL — ABNORMAL HIGH (ref 70–99)
Potassium: 3 mmol/L — ABNORMAL LOW (ref 3.5–5.1)
Sodium: 137 mmol/L (ref 135–145)

## 2023-07-23 LAB — GLUCOSE, CAPILLARY: Glucose-Capillary: 167 mg/dL — ABNORMAL HIGH (ref 70–99)

## 2023-07-23 MED ORDER — METRONIDAZOLE 500 MG PO TABS: 500.0000 mg | ORAL_TABLET | Freq: Two times a day (BID) | ORAL | 0 refills | Status: AC

## 2023-07-23 MED ORDER — METRONIDAZOLE 500 MG PO TABS: 500.0000 mg | ORAL_TABLET | Freq: Two times a day (BID) | ORAL | Status: DC

## 2023-07-23 MED ORDER — CEFADROXIL 500 MG PO CAPS: 1000.0000 mg | ORAL_CAPSULE | Freq: Two times a day (BID) | ORAL | Status: DC

## 2023-07-23 MED ORDER — CEFADROXIL 500 MG PO CAPS: 1000.0000 mg | ORAL_CAPSULE | Freq: Two times a day (BID) | ORAL | 0 refills | Status: AC

## 2023-07-23 MED ORDER — COLLAGENASE 250 UNIT/GM EX OINT: TOPICAL_OINTMENT | Freq: Every day | CUTANEOUS | 0 refills | Status: DC

## 2023-07-23 NOTE — Discharge Summary (Signed)
Physician Discharge Summary  Joniel W Lisa Roca. HQI:[id number] DOB: 10-11-1940 DOA: 07/20/2023  PCP: Linus Galas, NP  Admit date: 07/20/2023 Discharge date: 07/23/2023  Admitted From: Home Discharge disposition: Home  Recommendations at discharge:  Complete the course of antibiotics with 7 more days of oral Duricef and oral Flagyl Continue local wound care at home Follow-up with orthopedic Dr. Lajoyce Corners and vascular surgery Dr. Myra Gianotti as an outpatient   Brief narrative: Demarious Kapur. is a 82 y.o. male with PMH significant for obesity, HTN, HLD, CHF, permanent A-fib, CKD, PAD, carotid stenosis, gout, chronic left lateral leg wound for which he was getting wound care at home health. 10/30, patient was brought to [facility] by family for the concern of infection on the nonhealing left lateral leg wound.  Wound has been worsening for last 2 months.  He recently had a course of doxycycline despite which wound continued to worsen  In the ED, afebrile, blood pressure elevated to 170s Initial labs with WC count normal at 6.2, hemoglobin 12.1, BUN/creatinine 24/1.24 Blood culture sent. X-ray of left tibia-fibula showed diffuse soft tissue swelling of the left lower leg, with punctate subcutaneous gas distally and laterally which could reflect gas-forming infection or overlying ulceration. No evidence of fracture, osteomyelitis, or radiopaque foreign body.  Seen by orthopedic surgery, Dr. Thad Ranger.  Recommended IV antibiotics, local wound care, and transfer to [facility] for possible need of wound debridement if no improvement with antibiotics.   Started on IV Zosyn and IV vancomycin in the ED.   Admitted by [facility], hospitalist service.   Subjective: Patient was seen and examined this morning.  Sitting up in bed.  Not in distress.  Hard of hearing.  Not in symptoms. I spoke with his wife on the phone.  Assessment and plan: Infected chronic left lateral wound Left toe wounds X-ray left  tibia/fibula showed soft tissue swelling, ulcer without involvement of bone.   Seen by orthopedics.  Recommended medical management with antibiotics and wound care.   Currently on IV antibiotics with IV cefepime and Flagyl. No growth on blood culture. Wound healing well.  Wound care consult appreciated.  WBC count normal.  Lactate is low normal.   Based on clinical improvement with IV Rocephin and IV Flagyl, I will discharge the patient on oral Duricef and oral Flagyl for 7 days. To follow-up with orthopedic Dr. Lajoyce Corners as an outpatient Continue local wound care at home with home health care RN Recent Labs  Lab 07/20/23 1639 07/20/23 2329 07/21/23 0043  WBC 6.2  --   --   LATICACIDVEN  --  1.0 1.0   Abnormal left toe brachial index Vascular surgery was consulted.  No need of intervention at this time.   To follow-up with vascular surgery as an outpatient.  Acute respiratory failure with hypoxia Aspiration On the night of admission, patient had an episode of choking while drinking water.  His O2 sat dropped and he was kept on BiPAP. Chest x-ray showed increased pulmonary vascularity. Blood gas was reassuring with pCO2 at 39 IV fluid was stopped. Patient's respiratory status gradually improved.  Currently breathing on room air. Speech eval obtained.  No evidence of dysphagia.  Regular diet started  Chronic diastolic CHF Hypertension Blood pressure in 170s overnight, 130s this morning. PTA meds- Toprol 125 mg daily, Lasix 20 mg daily as needed.  Per wife, patient does not like to take Lasix and has not taken it in several days. Currently continued on Toprol only.  Most recent echo from 2022 with EF 65 to 70% with severe concentric LVH Follows up with Dr. Carolanne Grumbling as an outpatient  Permanent A-fib Currently rate controlled with Toprol. Per outpatient cardiology note, the patient had declined anticoagulation Continue to monitor on telemetry  Type 2 diabetes mellitus A1c 6.9 in  2021 Glucose level remains diet controlled Recent Labs  Lab 07/21/23 1723 07/22/23 0741 07/22/23 1054 07/22/23 2013 07/23/23 0759  GLUCAP 128* 109* 112* 107* 167*    CKD 3A Creatinine remains at baseline. Recent Labs    06/10/23 1630 07/20/23 1639 07/22/23 0225 07/23/23 0218  BUN 21 24* 25* 35*  CREATININE 1.59* 1.24 1.68* 1.57*   Chronic macrocytic anemia GERD Continue PPI Recent Labs    06/10/23 1630 07/20/23 1639  HGB 12.3* 12.1*  MCV 102* 101.7*   Left subconjunctival hemorrhage Likely due to elevated blood pressure. CT scan orbits were obtained which was unremarkable.  Gout As needed colchicine  Obesity  Body mass index is 31.26 kg/m. Patient has been advised to make an attempt to improve diet and exercise patterns to aid in weight loss.  Impaired mobility PT OT eval obtained. Home with PT recommended   Goals of care   Code Status: Limited: Do not attempt resuscitation (DNR) -DNR-LIMITED -Do Not Intubate/DNI    Wounds: - Wound / Incision (Open or Dehisced) 07/21/23 Diabetic ulcer Pretibial Distal;Left Open wound (Active)  Date First Assessed/Time First Assessed: 07/21/23 0300   Wound Type: Diabetic ulcer  Location: Pretibial  Location Orientation: Distal;Left  Wound Description (Comments): Open wound  Present on Admission: Yes    Assessments 07/21/2023  3:00 AM 07/23/2023  6:30 AM  Dressing Type Foam - Lift dressing to assess site every shift;Other (Comment) Gauze (Comment);Abdominal pads;Santyl;Normal saline moist dressing  Dressing Changed New Changed  Dressing Status Clean, Dry, Intact Clean, Dry, Intact  Dressing Change Frequency -- Twice a day  Site / Wound Assessment Painful;Pink;Yellow Pink;Yellow;Painful  Peri-wound Assessment Erythema (blanchable);Pink --  Wound Length (cm) 6 cm --  Wound Width (cm) 4 cm --  Wound Depth (cm) 0.5 cm --  Wound Volume (cm^3) 12 cm^3 --  Wound Surface Area (cm^2) 24 cm^2 --  Margins Unattached edges  (unapproximated) Unattached edges (unapproximated)  Drainage Amount None Minimal  Treatment Cleansed;Other (Comment) --     No associated orders.     Wound / Incision (Open or Dehisced) 07/21/23 Other (Comment) Knee Anterior;Right (Active)  Date First Assessed/Time First Assessed: 07/21/23 0300   Wound Type: (c) Other (Comment)  Location: Knee  Location Orientation: Anterior;Right  Present on Admission: Yes    Assessments 07/21/2023  3:00 AM 07/22/2023  8:00 PM  Dressing Type None None  Site / Wound Assessment Black Black  Peri-wound Assessment Intact --  Drainage Amount None --     No associated orders.    Discharge Exam:   Vitals:   07/23/23 0333 07/23/23 0400 07/23/23 0648 07/23/23 0755  BP:  (!) 159/61  (!) 141/66  Pulse:    69  Resp: (!) 23 20 20    Temp: 98.9 F (37.2 C)   98 F (36.7 C)  TempSrc: Oral   Oral  SpO2: 94% 96% 97%   Weight:   82.6 kg   Height:        Body mass index is 31.26 kg/m.   General exam: Pleasant, elderly Caucasian male.  Not in distress Skin: No rashes, lesions or ulcers. HEENT: Atraumatic, normocephalic, no obvious bleeding Lungs: Clear to auscultation bilaterally CVS:  Regular rate and rhythm, no murmur GI/Abd soft, nontender, nondistended, bowels are present CNS: Earlier in the morning, he was somnolent.  This afternoon he is alert, awake Psychiatry: Mood appropriate Extremities: Regular, no calf tenderness.  Left leg wound remains on wrap bandage.  Follow ups:    Follow-up Information     Linus Galas, NP Follow up.   Contact information: [address] [place] [address] [place] [address] [phone]                 Discharge Instructions:   Discharge Instructions     Call MD for:  difficulty breathing, headache or visual disturbances   Complete by: As directed    Call MD for:  extreme fatigue   Complete by: As directed    Call MD for:  hives   Complete by: As directed    Call MD for:  persistant dizziness or  light-headedness   Complete by: As directed    Call MD for:  persistant nausea and vomiting   Complete by: As directed    Call MD for:  severe uncontrolled pain   Complete by: As directed    Call MD for:  temperature >100.4   Complete by: As directed    Diet general   Complete by: As directed    Discharge instructions   Complete by: As directed    Recommendations at discharge:   Complete the course of antibiotics with 7 more days of oral Duricef and oral Flagyl  Continue local wound care at home  Follow-up with orthopedic Dr. Lajoyce Corners and vascular surgery Dr. Myra Gianotti as an outpatient  Discharge instructions for CHF Check weight daily -preferably same time every day. Restrict fluid intake to 1200 ml daily Restrict salt intake to less than 2 g daily. Call MD if you have one of the following symptoms 1) 3 pound weight gain in 24 hours or 5 pounds in 1 week  2) swelling in the hands, feet or stomach  3) progressive shortness of breath 4) if you have to sleep on extra pillows at night in order to breathe     General discharge instructions: Follow with Primary MD Linus Galas, NP in 7 days  Please request your PCP  to go over your hospital tests, procedures, radiology results at the follow up. Please get your medicines reviewed and adjusted.  Your PCP may decide to repeat certain labs or tests as needed. Do not drive, operate heavy machinery, perform activities at heights, swimming or participation in water activities or provide baby sitting services if your were admitted for syncope or siezures until you have seen by Primary MD or a Neurologist and advised to do so again. [place] Controlled Substance Reporting System database was reviewed. Do not drive, operate heavy machinery, perform activities at heights, swim, participate in water activities or provide baby-sitting services while on medications for pain, sleep and mood until your outpatient physician has reevaluated you and  advised to do so again.  You are strongly recommended to comply with the dose, frequency and duration of prescribed medications. Activity: As tolerated with Full fall precautions use walker/cane & assistance as needed Avoid using any recreational substances like cigarette, tobacco, alcohol, or non-prescribed drug. If you experience worsening of your admission symptoms, develop shortness of breath, life threatening emergency, suicidal or homicidal thoughts you must seek medical attention immediately by calling 911 or calling your MD immediately  if symptoms less severe. You must read complete instructions/literature along with all the possible  adverse reactions/side effects for all the medicines you take and that have been prescribed to you. Take any new medicine only after you have completely understood and accepted all the possible adverse reactions/side effects.  Wear Seat belts while driving. You were cared for by a hospitalist during your hospital stay. If you have any questions about your discharge medications or the care you received while you were in the hospital after you are discharged, you can call the unit and ask to speak with the hospitalist or the covering physician. Once you are discharged, your primary care physician will handle any further medical issues. Please note that NO REFILLS for any discharge medications will be authorized once you are discharged, as it is imperative that you return to your primary care physician (or establish a relationship with a primary care physician if you do not have one).   Discharge wound care:   Complete by: As directed    Increase activity slowly   Complete by: As directed        Discharge Medications:   Allergies as of 07/23/2023   No Known Allergies      Medication List     STOP taking these medications    doxycycline 100 MG capsule Commonly known as: VIBRAMYCIN       TAKE these medications    acetaminophen 500 MG  tablet Commonly known as: TYLENOL Take 500 mg by mouth once as needed for moderate pain (pain score 4-6).   cefadroxil 500 MG capsule Commonly known as: DURICEF Take 2 capsules (1,000 mg total) by mouth 2 (two) times daily for 7 days.   colchicine 0.6 MG tablet Take 1 tablet (0.6 mg total) by mouth daily.   collagenase 250 UNIT/GM ointment Commonly known as: SANTYL Apply topically daily. Start taking on: July 24, 2023   metoprolol succinate 25 MG 24 hr tablet Commonly known as: Toprol XL Take 1 tablet (25 mg total) by mouth daily. Take with or immediately following a meal. Take with 100mg  tablet daily for total dose 125mg  daily   metoprolol succinate 100 MG 24 hr tablet Commonly known as: TOPROL-XL Take 1 tablet by mouth once daily   metroNIDAZOLE 500 MG tablet Commonly known as: FLAGYL Take 1 tablet (500 mg total) by mouth every 12 (twelve) hours for 7 days.   pantoprazole 40 MG tablet Commonly known as: PROTONIX Take 1 tablet (40 mg total) by mouth daily.   potassium chloride 10 MEQ tablet Commonly known as: KLOR-CON Take 10 mEq by mouth daily.   QC Tumeric Complex 500 MG Caps Generic drug: Turmeric Take 1 capsule by mouth daily.   triamcinolone cream 0.1 % Commonly known as: KENALOG Apply 1 Application topically daily.               Discharge Care Instructions  (From admission, onward)           Start     Ordered   07/23/23 0000  Discharge wound care:        07/23/23 1108             The results of significant diagnostics from this hospitalization (including imaging, microbiology, ancillary and laboratory) are listed below for reference.    Procedures and Diagnostic Studies:   VAS [place] ABI WITH/WO TBI  Result Date: 07/22/2023  LOWER EXTREMITY DOPPLER STUDY Patient Name:  DIVONTE SENGER.  Date of Exam:   07/21/2023 Medical Rec #: [id number]  Accession #:    [id number] Date of Birth: [birth year]          Patient Gender: M Patient  Age:   48 years Exam Location:  [facility] Procedure:      VAS [place] ABI WITH/WO TBI Referring Phys: Rosalee Kaufman --------------------------------------------------------------------------------  Indications: Ulceration. High Risk Factors: Hypertension.  Limitations: Today's exam was limited due to involuntary patient movement,              bandages and Patient unable to tolerate cuff pressure due to wound              on ankle. Comparison Study: No prior study. Performing Technologist: Fernande Bras  Examination Guidelines: A complete evaluation includes at minimum, Doppler waveform signals and systolic blood pressure reading at the level of bilateral brachial, anterior tibial, and posterior tibial arteries, when vessel segments are accessible. Bilateral testing is considered an integral part of a complete examination. Photoelectric Plethysmograph (PPG) waveforms and toe systolic pressure readings are included as required and additional duplex testing as needed. Limited examinations for reoccurring indications may be performed as noted.  ABI Findings: +---------+------------------+-----+---------+--------+ Right    Rt Pressure (mmHg)IndexWaveform Comment  +---------+------------------+-----+---------+--------+ Brachial 143                    triphasic         +---------+------------------+-----+---------+--------+ PTA      152               0.97 triphasic         +---------+------------------+-----+---------+--------+ DP       137               0.87 biphasic          +---------+------------------+-----+---------+--------+ Great Toe76                0.48 Abnormal          +---------+------------------+-----+---------+--------+ +---------+------------------+-----+---------+---------------------------------+ Left     Lt Pressure (mmHg)IndexWaveform Comment                           +---------+------------------+-----+---------+---------------------------------+  Brachial 157                    triphasic                                  +---------+------------------+-----+---------+---------------------------------+ PTA                             triphasicUnable to obtain pressure                                                  secondary to pain and movement    +---------+------------------+-----+---------+---------------------------------+ DP                              biphasic Unable to obtain pressure  secondary to pain and movement    +---------+------------------+-----+---------+---------------------------------+ Great Toe95                0.61 Absent                                     +---------+------------------+-----+---------+---------------------------------+ +-------+-----------+-----------+------------+------------+ ABI/TBIToday's ABIToday's TBIPrevious ABIPrevious TBI +-------+-----------+-----------+------------+------------+ Right  0.97       0.46                                +-------+-----------+-----------+------------+------------+ Left   limited    0.61                                +-------+-----------+-----------+------------+------------+  Summary: Right: Resting right ankle-brachial index is within normal range. The right toe-brachial index is abnormal. Left: The left toe-brachial index is abnormal. Normal waveforms noted, however unable to attain ABI secondary to pain and movement with cuff pressure. *See table(s) above for measurements and observations.  Electronically signed by Heath Lark on 07/22/2023 at 1:48:50 PM.    Final    CT ORBITS W CONTRAST  Result Date: 07/21/2023 CLINICAL DATA:  Provided history: Orbital trauma. EXAM: CT ORBITS WITH CONTRAST TECHNIQUE: Multidetector CT images was performed according to the standard protocol following intravenous contrast administration. RADIATION DOSE REDUCTION: This exam was performed  according to the departmental dose-optimization program which includes automated exposure control, adjustment of the mA and/or kV according to patient size and/or use of iterative reconstruction technique. CONTRAST:  75mL OMNIPAQUE IOHEXOL 350 MG/ML SOLN COMPARISON:  None. FINDINGS: Orbits: The globes are normal in size and contour. The extraocular muscles, optic nerve sheath complexes and lacrimal glands are symmetric and unremarkable. No evidence of retrobulbar hematoma. No orbital mass or pathologic orbital enhancement. Visible paranasal sinuses: Small mucous retention cysts within the bilateral maxillary sinuses. Soft tissues: Unremarkable appearance of the periorbital soft tissues. Osseous: No acute fracture or aggressive osseous lesion. Limited intracranial: No evidence of an acute intracranial abnormality within the field of view. Cerebral atrophy. Chronic small vessel ischemic changes within the cerebral white matter. Chronic lacunar infarct within the left caudate nucleus. IMPRESSION: 1. Unremarkable CT appearance of the orbits. 2. Small mucous retention cysts within the bilateral maxillary sinuses. 3. Cerebral atrophy, cerebral white matter chronic small vessel ischemic disease and chronic left basal ganglia lacunar infarct on limited intracranial assessment. Electronically Signed   By: Jackey Loge D.O.   On: 07/21/2023 13:23   ECHOCARDIOGRAM COMPLETE  Result Date: 07/21/2023    ECHOCARDIOGRAM REPORT   Patient Name:   Marqui Formby. Date of Exam: 07/21/2023 Medical Rec #:  [id number]          Height:       64.0 in Accession #:    [id number]         Weight:       188.9 lb Date of Birth:  1941-01-13         BSA:          1.910 m Patient Age:    82 years           BP:           143/98 mmHg Patient Gender: M  HR:           70 bpm. Exam Location:  Inpatient Procedure: 2D Echo, Cardiac Doppler and Color Doppler Indications:    CHF  History:        Patient has prior history of  Echocardiogram examinations, most                 recent 02/27/2021. CHF, Carotid Disease, Arrythmias:Atrial                 Fibrillation; Risk Factors:Dyslipidemia and Hypertension. CKD.  Sonographer:    Milda Smart Referring Phys: [id number] CAROLE N HALL  Sonographer Comments: Image acquisition challenging due to respiratory motion and Image acquisition challenging due to patient body habitus. IMPRESSIONS  1. Left ventricular ejection fraction, by estimation, is 55 to 60%. The left ventricle has normal function. The left ventricle has no regional wall motion abnormalities. There is moderate concentric left ventricular hypertrophy. Left ventricular diastolic parameters are indeterminate.  2. Right ventricular systolic function is normal. The right ventricular size is normal. Tricuspid regurgitation signal is inadequate for assessing PA pressure.  3. Left atrial size was moderately dilated.  4. The mitral valve was not well visualized. Trivial mitral valve regurgitation.  5. The aortic valve is tricuspid. Aortic valve regurgitation is mild to moderate.  6. The inferior vena cava is normal in size with greater than 50% respiratory variability, suggesting right atrial pressure of 3 mmHg. Conclusion(s)/Recommendation(s): Compared to the prior study, the AI appears worsened. FINDINGS  Left Ventricle: Left ventricular ejection fraction, by estimation, is 55 to 60%. The left ventricle has normal function. The left ventricle has no regional wall motion abnormalities. The left ventricular internal cavity size was normal in size. There is  moderate concentric left ventricular hypertrophy. Left ventricular diastolic parameters are indeterminate. Right Ventricle: The right ventricular size is normal. Right ventricular systolic function is normal. Tricuspid regurgitation signal is inadequate for assessing PA pressure. Left Atrium: Left atrial size was moderately dilated. Right Atrium: Right atrial size was normal in size.  Pericardium: There is no evidence of pericardial effusion. Mitral Valve: The mitral valve was not well visualized. Trivial mitral valve regurgitation. Tricuspid Valve: Tricuspid valve regurgitation is not demonstrated. Aortic Valve: The aortic valve is tricuspid. Aortic valve regurgitation is mild to moderate. Pulmonic Valve: Pulmonic valve regurgitation is trivial. Aorta: The aortic root and ascending aorta are structurally normal, with no evidence of dilitation. Venous: The inferior vena cava is normal in size with greater than 50% respiratory variability, suggesting right atrial pressure of 3 mmHg. IAS/Shunts: The interatrial septum was not well visualized.  LEFT VENTRICLE PLAX 2D LVIDd:         3.70 cm LVIDs:         2.90 cm LV PW:         1.40 cm LV IVS:        1.50 cm LVOT diam:     2.20 cm LV SV:         61 LV SV Index:   32 LVOT Area:     3.80 cm  LV Volumes (MOD) LV vol d, MOD A4C: 103.0 ml LV vol s, MOD A4C: 38.9 ml LV SV MOD A4C:     103.0 ml RIGHT VENTRICLE RV S prime:     14.10 cm/s TAPSE (M-mode): 1.9 cm LEFT ATRIUM             Index        RIGHT ATRIUM  Index LA diam:        3.90 cm 2.04 cm/m   RA Area:     18.60 cm LA Vol (A2C):   78.3 ml 41.00 ml/m  RA Volume:   49.60 ml  25.97 ml/m LA Vol (A4C):   79.0 ml 41.37 ml/m LA Biplane Vol: 81.7 ml 42.78 ml/m  AORTIC VALVE LVOT Vmax:   87.90 cm/s LVOT Vmean:  62.500 cm/s LVOT VTI:    0.160 m  AORTA Ao Root diam: 3.30 cm Ao Asc diam:  3.90 cm MR Peak grad: 51.0 mmHg MR Vmax:      357.00 cm/s SHUNTS                           Systemic VTI:  0.16 m                           Systemic Diam: 2.20 cm Carolan Clines Electronically signed by Carolan Clines Signature Date/Time: 07/21/2023/11:24:16 AM    Final    DG Chest Portable 1 View  Result Date: 07/21/2023 CLINICAL DATA:  Wheezing and shortness of breath EXAM: PORTABLE CHEST 1 VIEW COMPARISON:  02/26/2021 FINDINGS: The chin obscures the apex. Stable cardiomegaly. Pulmonary vascular congestion. Hazy  airspace and interstitial opacities in the right mid and lower lung. Large hiatal hernia and left basilar atelectasis. No definite pleural effusion. No pneumothorax. IMPRESSION: Hazy opacities in the right mid and lower lung may be due to atypical infection or asymmetric edema. Large hiatal hernia Electronically Signed   By: Minerva Fester M.D.   On: 07/21/2023 03:24   DG Tibia/Fibula Left  Result Date: 07/20/2023 CLINICAL DATA:  Left leg wound EXAM: LEFT TIBIA AND FIBULA - 2 VIEW COMPARISON:  None Available. FINDINGS: Frontal and lateral views of the left tibia and fibula are obtained. There are no acute or destructive bony abnormalities. Alignment of the left knee and ankle is anatomic. Moderate medial compartmental osteoarthritis of the left knee. There is diffuse soft tissue swelling throughout the left lower leg, greatest distally and laterally. Minimal subcutaneous gas within the lateral distal left lower leg. No radiopaque foreign body. IMPRESSION: 1. Diffuse soft tissue swelling of the left lower leg, with punctate subcutaneous gas distally and laterally. This could reflect gas-forming infection or overlying ulceration. Correlation with visual inspection recommended. 2. No evidence of fracture, osteomyelitis, or radiopaque foreign body. 3. Left knee osteoarthritis. Electronically Signed   By: Sharlet Salina M.D.   On: 07/20/2023 18:20     Labs:   Basic Metabolic Panel: Recent Labs  Lab 07/20/23 1639 07/22/23 0225 07/23/23 0218  NA 139 138 137  K 3.5 3.0* 3.0*  CL 104 99 102  CO2 27 25 25   GLUCOSE 141* 118* 104*  BUN 24* 25* 35*  CREATININE 1.24 1.68* 1.57*  CALCIUM 9.0 8.8* 8.6*   GFR Estimated Creatinine Clearance: 36.4 mL/min (A) (by C-G formula based on SCr of 1.57 mg/dL (H)). Liver Function Tests: No results for input(s): "AST", "ALT", "ALKPHOS", "BILITOT", "PROT", "ALBUMIN" in the last 168 hours. No results for input(s): "LIPASE", "AMYLASE" in the last 168 hours. No  results for input(s): "AMMONIA" in the last 168 hours. Coagulation profile No results for input(s): "INR", "PROTIME" in the last 168 hours.  CBC: Recent Labs  Lab 07/20/23 1639  WBC 6.2  NEUTROABS 5.2  HGB 12.1*  HCT 36.9*  MCV 101.7*  PLT 235   Cardiac Enzymes:  No results for input(s): "CKTOTAL", "CKMB", "CKMBINDEX", "TROPONINI" in the last 168 hours. BNP: Invalid input(s): "POCBNP" CBG: Recent Labs  Lab 07/21/23 1723 07/22/23 0741 07/22/23 1054 07/22/23 2013 07/23/23 0759  GLUCAP 128* 109* 112* 107* 167*   D-Dimer No results for input(s): "DDIMER" in the last 72 hours. Hgb A1c No results for input(s): "HGBA1C" in the last 72 hours. Lipid Profile No results for input(s): "CHOL", "HDL", "LDLCALC", "TRIG", "CHOLHDL", "LDLDIRECT" in the last 72 hours. Thyroid function studies No results for input(s): "TSH", "T4TOTAL", "T3FREE", "THYROIDAB" in the last 72 hours.  Invalid input(s): "FREET3" Anemia work up No results for input(s): "VITAMINB12", "FOLATE", "FERRITIN", "TIBC", "IRON", "RETICCTPCT" in the last 72 hours. Microbiology Recent Results (from the past 240 hour(s))  Blood culture (routine x 2)     Status: None (Preliminary result)   Collection Time: 07/20/23 11:12 PM   Specimen: BLOOD  Result Value Ref Range Status   Specimen Description   Final    BLOOD BLOOD RIGHT ARM Performed at [facility], [address] [place], [place] [address]    Special Requests   Final    BOTTLES DRAWN AEROBIC AND ANAEROBIC Blood Culture results may not be optimal due to an excessive volume of blood received in culture bottles Performed at [facility], [address], [place], [place] [address]    Culture   Final    NO GROWTH 2 DAYS Performed at [facility], [address] [place], [place] [address]    Report Status PENDING  Incomplete  Blood culture (routine x 2)     Status: None (Preliminary result)   Collection Time: 07/20/23  11:20 PM   Specimen: BLOOD  Result Value Ref Range Status   Specimen Description   Final    BLOOD BLOOD LEFT ARM Performed at [facility], [address] [place], [place] [address]    Special Requests   Final    BOTTLES DRAWN AEROBIC AND ANAEROBIC Blood Culture adequate volume Performed at [facility], [address] [place], [place] [address]    Culture   Final    NO GROWTH 2 DAYS Performed at [facility], [address] [place], [place] [address]    Report Status PENDING  Incomplete  MRSA Next Gen by PCR, Nasal     Status: None   Collection Time: 07/21/23 12:29 AM   Specimen: Nasal Mucosa; Nasal Swab  Result Value Ref Range Status   MRSA by PCR Next Gen NOT DETECTED NOT DETECTED Final    Comment: (NOTE) The GeneXpert MRSA Assay (FDA approved for NASAL specimens only), is one component of a comprehensive MRSA colonization surveillance program. It is not intended to diagnose MRSA infection nor to guide or monitor treatment for MRSA infections. Test performance is not FDA approved in patients less than 51 years old. Performed at [facility], [address] [place], [place] [address]   MRSA Next Gen by PCR, Nasal     Status: None   Collection Time: 07/21/23  2:58 AM   Specimen: Nasal Mucosa; Nasal Swab  Result Value Ref Range Status   MRSA by PCR Next Gen NOT DETECTED NOT DETECTED Final    Comment: (NOTE) The GeneXpert MRSA Assay (FDA approved for NASAL specimens only), is one component of a comprehensive MRSA colonization surveillance program. It is not intended to diagnose MRSA infection nor to guide or monitor treatment for MRSA infections. Test performance is not  FDA approved in patients less than 59 years old. Performed at [facility], [address], [place], [place] [address]     Time coordinating discharge: 45 minutes  Signed: Catriona Dillenbeck  [facility] 07/23/2023, 11:08  AM

## 2023-07-23 NOTE — Consult Note (Signed)
[name]  Patient name: [name] MRN: [id number] DOB: 02-Jun-1941 Sex: male   REQUESTING PROVIDER:    Hospitalists   REASON FOR CONSULT:    Leg wound, left  HISTORY OF PRESENT ILLNESS:   [name] is a 82 y.o. male, who was admitted with a left leg wound infection on 07/20/2023.  He has been on outpatient antibiotics.  He was initially seen by orthopedics and started on IV antibiotics.  The patient states that his leg is feeling better.  The patient suffers from stage IV renal insufficiency.  He is medically managed for hypertension.  He is a non-smoker.  He is on anticoagulation for atrial fibrillation  PAST MEDICAL HISTORY    Past Medical History:  Diagnosis Date   Benign localized hyperplasia of prostate with urinary obstruction 07/21/2015   CAP (community acquired pneumonia) 04/14/2015   Carotid stenosis    left   CKD (chronic kidney disease) stage 4, GFR 15-29 ml/min (HCC) 04/14/2015   Complication of anesthesia    Essential hypertension 04/14/2015   Gram-negative bacteremia 04/15/2015   Headache    migraines   Hearing loss    Hematuria 04/19/2015   HLD (hyperlipidemia)    Normocytic hypochromic anemia 04/14/2015   Permanent atrial fibrillation (HCC) 04/14/2015   PONV (postoperative nausea and vomiting)    Sepsis secondary to UTI (HCC) 04/23/2015   Transfusion history    last 04-24-15   Urinary retention      FAMILY HISTORY   Family History  Problem Relation Age of Onset   Breast cancer Mother     SOCIAL HISTORY:   Social History   Socioeconomic History   Marital status: Married    Spouse name: Not on file   Number of children: Not on file   Years of education: Not on file   Highest education level: Not on file  Occupational History   Occupation: retired  Tobacco Use   Smoking status: Never   Smokeless tobacco: Never  Substance and Sexual Activity   Alcohol use: No    Drug use: No   Sexual activity: Not on file  Other Topics Concern   Not on file  Social History Narrative   Not on file   Social Determinants of Health   Financial Resource Strain: Not on file  Food Insecurity: No Food Insecurity (07/21/2023)   Hunger Vital Sign    Worried About Running Out of Food in the Last Year: Never true    Ran Out of Food in the Last Year: Never true  Transportation Needs: No Transportation Needs (07/21/2023)   PRAPARE - Administrator, Civil Service (Medical): No    Lack of Transportation (Non-Medical): No  Physical Activity: Not on file  Stress: Not on file  Social Connections: Not on file  Intimate Partner Violence: Not At Risk (07/21/2023)   Humiliation, Afraid, Rape, and Kick questionnaire    Fear of Current or Ex-Partner: No    Emotionally Abused: No    Physically Abused: No    Sexually Abused: No    ALLERGIES:    No Known Allergies  CURRENT MEDICATIONS:    Current Facility-Administered Medications  Medication Dose Route Frequency Provider Last Rate Last Admin   acetaminophen (TYLENOL) tablet 650 mg  650 mg Oral Q6H PRN Dow Adolph N, DO       cefTRIAXone (ROCEPHIN) 2 g in sodium chloride 0.9 % 100 mL IVPB  2 g Intravenous Q24H  Lorin Glass, MD   Stopped at 07/22/23 1233   collagenase (SANTYL) ointment   Topical Daily Dahal, Melina Schools, MD   Given at 07/22/23 0826   heparin injection 5,000 Units  5,000 Units Subcutaneous Q8H Darlin Drop, DO   5,000 Units at 07/23/23 4098   hydrALAZINE (APRESOLINE) injection 5 mg  5 mg Intravenous Q4H PRN Dow Adolph N, DO   5 mg at 07/23/23 0013   ipratropium-albuterol (DUONEB) 0.5-2.5 (3) MG/3ML nebulizer solution 3 mL  3 mL Nebulization Q2H PRN Hall, Carole N, DO       LORazepam (ATIVAN) injection 1 mg  1 mg Intravenous Once Crosley, Debby, MD       melatonin tablet 5 mg  5 mg Oral QHS PRN Dow Adolph N, DO   5 mg at 07/22/23 2134   metoprolol succinate (TOPROL-XL) 24 hr tablet 125 mg   125 mg Oral Daily Dahal, Melina Schools, MD   125 mg at 07/22/23 0826   metroNIDAZOLE (FLAGYL) IVPB 500 mg  500 mg Intravenous Q12H Dow Adolph N, DO 100 mL/hr at 07/23/23 0008 500 mg at 07/23/23 0008   Oral care mouth rinse  15 mL Mouth Rinse PRN Dahal, Melina Schools, MD       pantoprazole (PROTONIX) EC tablet 40 mg  40 mg Oral Daily Dow Adolph N, DO   40 mg at 07/22/23 1191   polyethylene glycol (MIRALAX / GLYCOLAX) packet 17 g  17 g Oral Daily PRN Dow Adolph N, DO       prochlorperazine (COMPAZINE) injection 5 mg  5 mg Intravenous Q6H PRN Darlin Drop, DO        REVIEW OF SYSTEMS:   [X]  denotes positive finding, [ ]  denotes negative finding Cardiac  Comments:  Chest pain or chest pressure:    Shortness of breath upon exertion:    Short of breath when lying flat:    Irregular heart rhythm:        Vascular    Pain in calf, thigh, or hip brought on by ambulation:    Pain in feet at night that wakes you up from your sleep:     Blood clot in your veins:    Leg swelling:         Pulmonary    Oxygen at home:    Productive cough:     Wheezing:         Neurologic    Sudden weakness in arms or legs:     Sudden numbness in arms or legs:     Sudden onset of difficulty speaking or slurred speech:    Temporary loss of vision in one eye:     Problems with dizziness:         Gastrointestinal    Blood in stool:      Vomited blood:         Genitourinary    Burning when urinating:     Blood in urine:        Psychiatric    Major depression:         Hematologic    Bleeding problems:    Problems with blood clotting too easily:        Skin    Rashes or ulcers:        Constitutional    Fever or chills:     PHYSICAL EXAM:   Vitals:   07/23/23 0333 07/23/23 0400 07/23/23 0648 07/23/23 0755  BP:  (!) 159/61  (!) 141/66  Pulse:  69  Resp: (!) 23 20 20    Temp: 98.9 F (37.2 C)   98 F (36.7 C)  TempSrc: Oral   Oral  SpO2: 94% 96% 97%   Weight:   82.6 kg   Height:         GENERAL: The patient is a well-nourished male, in no acute distress. The vital signs are documented above. CARDIAC: There is a regular rate and rhythm.  VASCULAR: Bilateral lower extremity edema.  I was able to palpate a left dorsalis pedis pulse PULMONARY: Nonlabored respirations ABDOMEN: Soft and non-tender with normal pitched bowel sounds.  MUSCULOSKELETAL: There are no major deformities or cyanosis. NEUROLOGIC: No focal weakness or paresthesias are detected. SKIN: See photo below. PSYCHIATRIC: The patient has a normal affect.  STUDIES:   I have reviewed the following:  ABI Findings:  +---------+------------------+-----+---------+--------+  Right   Rt Pressure (mmHg)IndexWaveform Comment   +---------+------------------+-----+---------+--------+  Brachial 143                    triphasic          +---------+------------------+-----+---------+--------+  PTA     152               0.97 triphasic          +---------+------------------+-----+---------+--------+  DP      137               0.87 biphasic           +---------+------------------+-----+---------+--------+  Great Toe76                0.48 Abnormal           +---------+------------------+-----+---------+--------+   +---------+------------------+-----+---------+-----------------------------  ----+  Left    Lt Pressure (mmHg)IndexWaveform Comment                             +---------+------------------+-----+---------+-----------------------------  ----+  Brachial 157                    triphasic                                    +---------+------------------+-----+---------+-----------------------------  ----+  PTA                            triphasicUnable to obtain pressure                                                    secondary to pain and  movement     +---------+------------------+-----+---------+-----------------------------  ----+  DP                              biphasic Unable to obtain pressure                                                    secondary to pain and  movement     +---------+------------------+-----+---------+-----------------------------  ----+  Hali Marry  0.61 Absent                                       +---------+------------------+-----+---------+-----------------------------  ----+   +-------+-----------+-----------+------------+------------+  ABI/TBIToday's ABIToday's TBIPrevious ABIPrevious TBI  +-------+-----------+-----------+------------+------------+  Right 0.97       0.46                                 +-------+-----------+-----------+------------+------------+  Left  limited    0.61                                 +-------+-----------+-----------+------------+------------+   ASSESSMENT and PLAN   Left leg wound: Based off of noninvasive imaging, the patient has triphasic and biphasic Doppler signals.  His toe pressure is 95.  I was able to palpate a pedal pulse.  Based on the disc findings, I suspect the patient should have adequate blood flow to heal his lateral calf wound.  He will need antibiotics as well as ongoing wound care with possible debridements.  He can follow-up with [place] on an outpatient basis.  He is also going to see Dr. Lajoyce Corners in the outpatient setting.   Charlena Cross, MD, FACS [facility] [phone] Pager [phone]

## 2023-07-23 NOTE — TOC Transition Note (Signed)
Transition of Care [facility]) - CM/SW Discharge Note   Patient Details  Name: [name] MRN: [id number] Date of Birth: 1940-12-08  Transition of Care [facility]) CM/SW Contact:  Ronny Bacon, RN Phone Number: 07/23/2023, 11:57 AM   Clinical Narrative:   Patient is being discharged. Misty Green with [facility] made aware and need to add [facility] PT to his [facility] RN services.      Barriers to Discharge: Continued Medical Work up   Patient Goals and CMS Choice   Choice offered to / list presented to : NA  Discharge Placement                         Discharge Plan and Services Additional resources added to the After Visit Summary for   In-house Referral: NA Discharge Planning Services: CM Consult Post Acute Care Choice: Resumption of Svcs/PTA Provider, Home Health          DME Arranged: N/A DME Agency: NA       HH Arranged: RN HH Agency:  [employer]) Date HH Agency Contacted: 07/22/23 Time HH Agency Contacted: 1435 Representative spoke with at [facility] Agency: Ines Bloomer - for integrated services  Social Determinants of Health (SDOH) Interventions SDOH Screenings   Food Insecurity: No Food Insecurity (07/21/2023)  Housing: Low Risk  (07/21/2023)  Transportation Needs: No Transportation Needs (07/21/2023)  Utilities: Not At Risk (07/21/2023)  Depression (PHQ2-9): Low Risk  (03/17/2021)  Tobacco Use: Low Risk  (07/20/2023)     Readmission Risk Interventions     No data to display

## 2023-07-26 ENCOUNTER — Telehealth: Payer: Self-pay

## 2023-07-26 LAB — CULTURE, BLOOD (ROUTINE X 2)
Culture: NO GROWTH
Culture: NO GROWTH
Special Requests: ADEQUATE

## 2023-07-26 NOTE — Transitions of Care (Post Inpatient/ED Visit) (Signed)
   07/26/2023  Name: [name] MRN: [id number] DOB: [birth year]  Today's TOC FU Call Status: Today's TOC FU Call Status:: Unsuccessful Call (1st Attempt) Unsuccessful Call (1st Attempt) Date: 07/26/23  Attempted to reach the patient regarding the most recent Inpatient/ED visit.  Follow Up Plan: Additional outreach attempts will be made to reach the patient to complete the Transitions of Care (Post Inpatient/ED visit) call.   Susa Loffler , BSN, RN Care Management Coordinator [place]   [facility] [email or web address] Direct Dial: [phone]

## 2023-07-28 ENCOUNTER — Encounter (HOSPITAL_BASED_OUTPATIENT_CLINIC_OR_DEPARTMENT_OTHER): Payer: Medicare Other | Attending: Internal Medicine | Admitting: Internal Medicine

## 2023-07-28 DIAGNOSIS — E11621 Type 2 diabetes mellitus with foot ulcer: Secondary | ICD-10-CM | POA: Insufficient documentation

## 2023-07-28 DIAGNOSIS — I890 Lymphedema, not elsewhere classified: Secondary | ICD-10-CM | POA: Insufficient documentation

## 2023-07-28 DIAGNOSIS — I5032 Chronic diastolic (congestive) heart failure: Secondary | ICD-10-CM | POA: Insufficient documentation

## 2023-07-28 DIAGNOSIS — L97828 Non-pressure chronic ulcer of other part of left lower leg with other specified severity: Secondary | ICD-10-CM | POA: Diagnosis not present

## 2023-07-28 DIAGNOSIS — F32A Depression, unspecified: Secondary | ICD-10-CM | POA: Diagnosis not present

## 2023-07-28 DIAGNOSIS — L03116 Cellulitis of left lower limb: Secondary | ICD-10-CM | POA: Insufficient documentation

## 2023-07-28 DIAGNOSIS — I130 Hypertensive heart and chronic kidney disease with heart failure and stage 1 through stage 4 chronic kidney disease, or unspecified chronic kidney disease: Secondary | ICD-10-CM | POA: Diagnosis not present

## 2023-07-28 DIAGNOSIS — E1122 Type 2 diabetes mellitus with diabetic chronic kidney disease: Secondary | ICD-10-CM | POA: Diagnosis not present

## 2023-07-28 DIAGNOSIS — E1151 Type 2 diabetes mellitus with diabetic peripheral angiopathy without gangrene: Secondary | ICD-10-CM | POA: Insufficient documentation

## 2023-07-28 DIAGNOSIS — N189 Chronic kidney disease, unspecified: Secondary | ICD-10-CM | POA: Diagnosis not present

## 2023-07-28 DIAGNOSIS — I872 Venous insufficiency (chronic) (peripheral): Secondary | ICD-10-CM | POA: Insufficient documentation

## 2023-07-28 DIAGNOSIS — I480 Paroxysmal atrial fibrillation: Secondary | ICD-10-CM | POA: Insufficient documentation

## 2023-07-28 DIAGNOSIS — M109 Gout, unspecified: Secondary | ICD-10-CM | POA: Insufficient documentation

## 2023-07-29 NOTE — Progress Notes (Signed)
PRIDE, NOVO ([id number]) [id number] Page 1 of 9 Visit Report for 07/28/2023 Allergy List Details Patient Name: Date of Service: [name], [name] 07/28/2023 12:45 PM Medical Record Number: [id number] Patient Account Number: [id number] Date of Birth/Sex: Treating RN: [birth year] (82 y.o. [name] Primary Care Aijalon Kirtz: Linus Galas Other Clinician: Referring Honestie Kulik: Treating Felita Bump/Extender: Tina Griffiths, Glade Stanford in Treatment: 0 Allergies Active Allergies No Known Allergies Allergy Notes Electronic Signature(s) Signed: 07/29/2023 12:52:25 PM By: Thayer Dallas Entered By: Thayer Dallas on 07/28/2023 13:09:42 -------------------------------------------------------------------------------- Arrival Information Details Patient Name: Date of Service: Frank Musa W. 07/28/2023 12:45 PM Medical Record Number: [id number] Patient Account Number: [id number] Date of Birth/Sex: Treating RN: 1941-02-16 (82 y.o. M) Primary Care Latoria Dry: Linus Galas Other Clinician: Referring Curvin Hunger: Treating Myrtle Barnhard/Extender: [name] in Treatment: 0 Visit Information Patient Arrived: Ambulatory Arrival Time: 12:53 Accompanied By: wife,sister Transfer Assistance: None Patient Identification Verified: Yes Secondary Verification Process Completed: Yes Patient Requires Transmission-Based Precautions: No Patient Has Alerts: Yes Patient Alerts: ABI R 0.97 ABI L 0.61 TBI 0.46 Electronic Signature(s) Signed: 07/28/2023 6:28:11 PM By: Karie Schwalbe RN Entered By: Karie Schwalbe on 07/28/2023 14:04:14 [name] ([id number]) [id number] Page 2 of 9 -------------------------------------------------------------------------------- Clinic Level of Care Assessment Details Patient Name: Date of Service: KENJI, PAUSTIAN. 07/28/2023 12:45 PM Medical Record Number: [id number] Patient Account Number:  [id number] Date of Birth/Sex: Treating RN: 1941-05-16 (82 y.o. [name] Primary Care Grainger Mccarley: Linus Galas Other Clinician: Referring Noble Bodie: Treating Amaliya Whitelaw/Extender: [name] in Treatment: 0 Clinic Level of Care Assessment Items TOOL 1 Quantity Score X- 1 0 Use when EandM and Procedure is performed on INITIAL visit ASSESSMENTS - Nursing Assessment / Reassessment X- 1 20 General Physical Exam (combine w/ comprehensive assessment (listed just below) when performed on new pt. evals) X- 1 25 Comprehensive Assessment (HX, ROS, Risk Assessments, Wounds Hx, etc.) ASSESSMENTS - Wound and Skin Assessment / Reassessment X- 1 10 Dermatologic / Skin Assessment (not related to wound area) ASSESSMENTS - Ostomy and/or Continence Assessment and Care []  - 0 Incontinence Assessment and Management []  - 0 Ostomy Care Assessment and Management (repouching, etc.) PROCESS - Coordination of Care X - Simple Patient / Family Education for ongoing care 1 15 []  - 0 Complex (extensive) Patient / Family Education for ongoing care X- 1 10 Staff obtains Chiropractor, Records, T Results / Process Orders est X- 1 10 Staff telephones HHA, Nursing Homes / Clarify orders / etc []  - 0 Routine Transfer to another Facility (non-emergent condition) []  - 0 Routine Hospital Admission (non-emergent condition) X- 1 15 New Admissions / Manufacturing engineer / Ordering NPWT Apligraf, etc. , []  - 0 Emergency Hospital Admission (emergent condition) PROCESS - Special Needs []  - 0 Pediatric / Minor Patient Management []  - 0 Isolation Patient Management []  - 0 Hearing / Language / Visual special needs []  - 0 Assessment of Community assistance (transportation, D/C planning, etc.) []  - 0 Additional assistance / Altered mentation []  - 0 Support Surface(s) Assessment (bed, cushion, seat, etc.) INTERVENTIONS - Miscellaneous []  - 0 External ear exam []  - 0 Patient Transfer  (multiple staff / Nurse, adult / Similar devices) []  - 0 Simple Staple / Suture removal (25 or less) []  - 0 Complex Staple / Suture removal (26 or more) []  - 0 Hypo/Hyperglycemic Management (do not check if billed separately) []  - 0 Ankle / Brachial Index (ABI) - do not check if billed separately Has the patient been  seen at the hospital within the last three years: Yes Total Score: 105 Level Of Care: New/Established - Level 3 Electronic Signature(s) Signed: 07/28/2023 6:28:11 PM By: Karie Schwalbe RN Entered By: Karie Schwalbe on 07/28/2023 18:15:07 [name] ([id number]) [phone].pdf Page 3 of 9 -------------------------------------------------------------------------------- Compression Therapy Details Patient Name: Date of Service: [name] 07/28/2023 12:45 PM Medical Record Number: [id number] Patient Account Number: [id number] Date of Birth/Sex: Treating RN: 03/18/1941 (83 y.o. [name] Primary Care Arden Tinoco: Linus Galas Other Clinician: Referring Keandrea Tapley: Treating Corban Kistler/Extender: [name] in Treatment: 0 Compression Therapy Performed for Wound Assessment: Wound #1 Left,Lateral Lower Leg Performed By: Clinician Karie Schwalbe, RN Compression Type: Three Layer Post Procedure Diagnosis Same as Pre-procedure Electronic Signature(s) Signed: 07/28/2023 6:28:11 PM By: Karie Schwalbe RN Entered By: Karie Schwalbe on 07/28/2023 14:12:41 -------------------------------------------------------------------------------- Encounter Discharge Information Details Patient Name: Date of Service: Frank Musa W. 07/28/2023 12:45 PM Medical Record Number: [id number] Patient Account Number: [id number] Date of Birth/Sex: Treating RN: Oct 02, 1940 (82 y.o. [name] Primary Care Javonta Gronau: Linus Galas Other Clinician: Referring Toddrick Sanna: Treating Damiean Lukes/Extender: [name] in  Treatment: 0 Encounter Discharge Information Items Post Procedure Vitals Discharge Condition: Stable Temperature (F): 98.2 Ambulatory Status: Ambulatory Pulse (bpm): 55 Discharge Destination: Home Respiratory Rate (breaths/min): 18 Transportation: Private Auto Blood Pressure (mmHg): 153/82 Accompanied By: wife and sister Schedule Follow-up Appointment: Yes Clinical Summary of Care: Electronic Signature(s) Signed: 07/28/2023 6:10:25 PM By: Shawn Stall RN, BSN Entered By: Shawn Stall on 07/28/2023 15:06:28 -------------------------------------------------------------------------------- Lower Extremity Assessment Details Patient Name: Date of Service: Frank Musa W. 07/28/2023 12:45 PM Medical Record Number: [id number] Patient Account Number: [id number] Date of Birth/Sex: Treating RN: 01/15/1941 (82 y.o. M) Primary Care Judine Arciniega: Linus Galas Other Clinician: Referring Kalenna Millett: Treating Sloane Palmer/Extender: Tina Griffiths, Glade Stanford in Treatment: 0 Edema Assessment Left: [Left: Right] [Right: :] Assessed: [Left: Yes] [Right: No] Edema: [Left: Ye] [Right: s] Calf Left: Right: Point of Measurement: 30 cm From Medial Instep 40 cm Ankle Left: Right: Point of Measurement: 12 cm From Medial Instep 25 cm Knee To Floor Left: Right: From Medial Instep 40 cm Vascular Assessment Pulses: Dorsalis Pedis Palpable: [Left:Yes] Doppler Audible: [Left:Yes] Posterior Tibial Palpable: [Left:Yes] Doppler Audible: [Left:Yes] Extremity colors, hair growth, and conditions: Extremity Color: [Left:Red] Hair Growth on Extremity: [Left:No] Temperature of Extremity: [Left:Warm] Capillary Refill: [Left:< 3 seconds] Dependent Rubor: [Left:No] Blanched when Elevated: [Left:No Yes] Toe Nail Assessment Left: Right: Thick: Yes Discolored: Yes Deformed: Yes Improper Length and Hygiene: Yes Notes Monophasic waveforms noted PT and DP pulses. Electronic Signature(s) Signed: 07/28/2023  6:10:25 PM By: Shawn Stall RN, BSN Entered By: Shawn Stall on 07/28/2023 13:28:57 -------------------------------------------------------------------------------- Multi Wound Chart Details Patient Name: Date of Service: Frank Musa W. 07/28/2023 12:45 PM Medical Record Number: [id number] Patient Account Number: [id number] Date of Birth/Sex: Treating RN: 1941/09/11 (82 y.o. M) Primary Care Alvina Strother: Linus Galas Other Clinician: Referring Rabon Scholle: Treating Jhaniya Briski/Extender: Tina Griffiths, Glade Stanford in Treatment: 0 Vital Signs Height(in): 64 Pulse(bpm): 55 Weight(lbs): 189.4 Blood Pressure(mmHg): 153/82 Body Mass Index(BMI): 32.5 Temperature(F): 98.2 Respiratory Rate(breaths/min): 18 [Nicasio, Ravis W ([id number]):Photos:] [phone].pdf Page 5 of 9:1 N/A N/A N/A N/A] Left, Lateral Lower Leg N/A N/A Wound Location: Bump N/A N/A Wounding Event: Venous Leg Ulcer N/A N/A Primary Etiology: Cataracts, Arrhythmia, Congestive N/A N/A Comorbid History: Heart Failure, Hypertension, Gout 06/13/2023 N/A N/A Date Acquired: 0 N/A N/A Weeks of Treatment: Open N/A N/A Wound Status: No N/A N/A Wound Recurrence: 11.5x9.5x0.4  N/A N/A Measurements L x W x D (cm) 85.805 N/A N/A A (cm) : rea 34.322 N/A N/A Volume (cm) : Full Thickness Without Exposed N/A N/A Classification: Support Structures Large N/A N/A Exudate A mount: Purulent N/A N/A Exudate Type: yellow, brown, green N/A N/A Exudate Color: Distinct, outline attached N/A N/A Wound Margin: Small (1-33%) N/A N/A Granulation A mount: Red N/A N/A Granulation Quality: Large (67-100%) N/A N/A Necrotic A mount: Fat Layer (Subcutaneous Tissue): Yes N/A N/A Exposed Structures: Fascia: No Tendon: No Muscle: No Joint: No Bone: No None N/A N/A Epithelialization: Debridement - Excisional N/A N/A Debridement: Pre-procedure Verification/Time Out 14:04 N/A N/A Taken: Lidocaine 4% Topical  Solution N/A N/A Pain Control: Subcutaneous, Slough N/A N/A Tissue Debrided: Skin/Subcutaneous Tissue N/A N/A Level: 85.76 N/A N/A Debridement A (sq cm): rea Curette N/A N/A Instrument: Minimum N/A N/A Bleeding: Pressure N/A N/A Hemostasis A chieved: Procedure was tolerated well N/A N/A Debridement Treatment Response: 11.5x9.5x0.4 N/A N/A Post Debridement Measurements L x W x D (cm) 34.322 N/A N/A Post Debridement Volume: (cm) Excoriation: No N/A N/A Periwound Skin Texture: Induration: No Callus: No Crepitus: No Rash: No Scarring: No Maceration: Yes N/A N/A Periwound Skin Moisture: Dry/Scaly: No Hemosiderin Staining: Yes N/A N/A Periwound Skin Color: Atrophie Blanche: No Cyanosis: No Ecchymosis: No Erythema: No Mottled: No Pallor: No Rubor: No No Abnormality N/A N/A Temperature: Compression Therapy N/A N/A Procedures Performed: Debridement Treatment Notes Electronic Signature(s) Signed: 07/28/2023 4:34:18 PM By: Baltazar Najjar MD Entered By: Baltazar Najjar on 07/28/2023 14:38:30 [name] ([id number]) [id number] Page 6 of 9 -------------------------------------------------------------------------------- Multi-Disciplinary Care Plan Details Patient Name: Date of Service: ADAMS, MENAKER. 07/28/2023 12:45 PM Medical Record Number: [id number] Patient Account Number: [id number] Date of Birth/Sex: Treating RN: 03-06-1941 (82 y.o. [name] Primary Care Chandelle Harkey: Linus Galas Other Clinician: Referring Tzivia Oneil: Treating Granvil Djordjevic/Extender: Tina Griffiths, Glade Stanford in Treatment: 0 Active Inactive Wound/Skin Impairment Nursing Diagnoses: Impaired tissue integrity Goals: Patient/caregiver will verbalize understanding of skin care regimen Date Initiated: 07/28/2023 Target Resolution Date: 04/12/2024 Goal Status: Active Interventions: Assess ulceration(s) every visit Treatment Activities: Skin care regimen  initiated : 07/28/2023 Notes: Electronic Signature(s) Signed: 07/28/2023 6:28:11 PM By: Karie Schwalbe RN Entered By: Karie Schwalbe on 07/28/2023 18:12:08 -------------------------------------------------------------------------------- Pain Assessment Details Patient Name: Date of Service: Frank Musa W. 07/28/2023 12:45 PM Medical Record Number: [id number] Patient Account Number: [id number] Date of Birth/Sex: Treating RN: [birth year] (82 y.o. [name] Primary Care Hollister Wessler: Linus Galas Other Clinician: Referring Rashauna Tep: Treating Staley Lunz/Extender: [name] in Treatment: 0 Active Problems Location of Pain Severity and Description of Pain Patient Has Paino No Site Locations Rate the pain. BARNIE, SHALHOUB ([id number]) [id number] Page 7 of 9 Rate the pain. Current Pain Level: 0 Pain Management and Medication Current Pain Management: Medication: No Cold Application: No Rest: No Massage: No Activity: No T.E.N.S.: No Heat Application: No Leg drop or elevation: No Is the Current Pain Management Adequate: Adequate How does your wound impact your activities of daily livingo Sleep: No Bathing: No Appetite: No Relationship With Others: No Bladder Continence: No Emotions: No Bowel Continence: No Work: No Toileting: No Drive: No Dressing: No Hobbies: No Psychologist, prison and probation services) Signed: 07/28/2023 6:10:25 PM By: Shawn Stall RN, BSN Entered By: Shawn Stall on 07/28/2023 13:30:24 -------------------------------------------------------------------------------- Patient/Caregiver Education Details Patient Name: Date of Service: [name] 11/7/2024andnbsp12:45 PM Medical Record Number: [id number] Patient Account Number: [id number] Date of Birth/Gender: Treating RN: Aug 17, 1941 (82 y.o. [name] Primary  Care Physician: Linus Galas Other Clinician: Referring Physician: Treating Physician/Extender:  [name] in Treatment: 0 Education Assessment Education Provided To: Patient Education Topics Provided Wound/Skin Impairment: Methods: Explain/Verbal Responses: State content correctly Electronic Signature(s) Signed: 07/28/2023 6:28:11 PM By: Karie Schwalbe RN Entered By: Karie Schwalbe on 07/28/2023 18:12:17 [name] ([id number]) [id number] Page 8 of 9 -------------------------------------------------------------------------------- Wound Assessment Details Patient Name: Date of Service: KHAMAR, STANDARD. 07/28/2023 12:45 PM Medical Record Number: [id number] Patient Account Number: [id number] Date of Birth/Sex: Treating RN: Dec 01, 1940 (82 y.o. M) Primary Care Glover Capano: Linus Galas Other Clinician: Referring Curvin Hunger: Treating Denzil Mceachron/Extender: Tina Griffiths, Glade Stanford in Treatment: 0 Wound Status Wound Number: 1 Primary Venous Leg Ulcer Etiology: Wound Location: Left, Lateral Lower Leg Wound Status: Open Wounding Event: Bump Comorbid Cataracts, Arrhythmia, Congestive Heart Failure, Date Acquired: 06/13/2023 History: Hypertension, Gout Weeks Of Treatment: 0 Clustered Wound: No Photos Wound Measurements Length: (cm) 11.5 Width: (cm) 9.5 Depth: (cm) 0.4 Area: (cm) 85.805 Volume: (cm) 34.322 % Reduction in Area: % Reduction in Volume: Epithelialization: None Tunneling: No Undermining: No Wound Description Classification: Full Thickness Without Exposed Suppor Wound Margin: Distinct, outline attached Exudate Amount: Large Exudate Type: Purulent Exudate Color: yellow, brown, green t Structures Foul Odor After Cleansing: No Slough/Fibrino Yes Wound Bed Granulation Amount: Small (1-33%) Exposed Structure Granulation Quality: Red Fascia Exposed: No Necrotic Amount: Large (67-100%) Fat Layer (Subcutaneous Tissue) Exposed: Yes Necrotic Quality: Adherent Slough Tendon Exposed: No Muscle Exposed:  No Joint Exposed: No Bone Exposed: No Periwound Skin Texture Texture Color No Abnormalities Noted: No No Abnormalities Noted: No Callus: No Atrophie Blanche: No Crepitus: No Cyanosis: No Excoriation: No Ecchymosis: No Induration: No Erythema: No Rash: No Hemosiderin Staining: Yes Scarring: No Mottled: No Pallor: No Moisture Rubor: No No Abnormalities Noted: No Dry / Scaly: No Temperature / Pain Maceration: Yes Temperature: No Abnormality BRYLEE, WALQUIST ([id number]) [id number] Page 9 of 9 Treatment Notes Wound #1 (Lower Leg) Wound Laterality: Left, Lateral Cleanser Soap and Water Discharge Instruction: May shower and wash wound with dial antibacterial soap and water prior to dressing change. Peri-Wound Care Topical Triamcinolone Discharge Instruction: Apply Triamcinolone as directed Primary Dressing Hydrofera Blue Ready Transfer Foam, 4x5 (in/in) Discharge Instruction: Apply to wound bed as instructed Santyl Ointment Discharge Instruction: Apply nickel thick amount to wound bed as instructed Secondary Dressing ABD Pad, 8x10 Discharge Instruction: Apply over primary dressing as directed. Woven Gauze Sponge, Non-Sterile 4x4 in Discharge Instruction: Apply over primary dressing as directed. Secured With Compression Wrap Urgo K2 Lite, (equivalent to a 3 layer) two layer compression system, regular Discharge Instruction: Apply Urgo K2 Lite as directed (alternative to 3 layer compression). ThreePress (3 layer compression wrap) Discharge Instruction: or Apply three layer compression as directed. Compression Stockings Add-Ons Electronic Signature(s) Signed: 07/28/2023 6:10:25 PM By: Shawn Stall RN, BSN Entered By: Shawn Stall on 07/28/2023 13:29:52 -------------------------------------------------------------------------------- Vitals Details Patient Name: Date of Service: Frank Musa W. 07/28/2023 12:45 PM Medical Record Number:  [id number] Patient Account Number: [id number] Date of Birth/Sex: Treating RN: [birth year] (82 y.o. M) Primary Care Tylerjames Hoglund: Linus Galas Other Clinician: Referring Pearlene Teat: Treating Truxton Stupka/Extender: Tina Griffiths, Glade Stanford in Treatment: 0 Vital Signs Time Taken: 13:04 Temperature (F): 98.2 Height (in): 64 Pulse (bpm): 55 Source: Stated Respiratory Rate (breaths/min): 18 Weight (lbs): 189.4 Blood Pressure (mmHg): 153/82 Source: Stated Reference Range: 80 - 120 mg / dl Body Mass Index (BMI): 32.5 Electronic Signature(s) Signed: 07/29/2023 12:52:25 PM By: Thayer Dallas Entered By: Thayer Dallas  on 07/28/2023 13:41:00

## 2023-07-29 NOTE — Progress Notes (Signed)
[name] ([id number]) [id number] Page 1 of 4 Visit Report for 07/28/2023 Abuse Risk Screen Details Patient Name: Date of Service: [name], [name] 07/28/2023 12:45 PM Medical Record Number: [id number] Patient Account Number: [id number] Date of Birth/Sex: Treating RN: 05/03/1941 (82 y.o. M) Primary Care [name]: [name] Other Clinician: Referring Calandra Madura: Treating [name]/Extender: [name], [name] in Treatment: 0 Abuse Risk Screen Items Answer ABUSE RISK SCREEN: Has anyone close to you tried to hurt or harm you recentlyo No Do you feel uncomfortable with anyone in your familyo No Has anyone forced you do things that you didnt want to doo No Electronic Signature(s) Signed: 07/29/2023 12:52:25 PM By: [name] Entered By: [name] on 07/28/2023 13:19:50 -------------------------------------------------------------------------------- Activities of Daily Living Details Patient Name: Date of Service: [name], [name] 07/28/2023 12:45 PM Medical Record Number: [id number] Patient Account Number: [id number] Date of Birth/Sex: Treating RN: 09-26-1940 (82 y.o. M) Primary Care [name]: [name] Other Clinician: Referring [name]: Treating [name]/Extender: [name], [name] in Treatment: 0 Activities of Daily Living Items Answer Activities of Daily Living (Please select one for each item) Drive Automobile Completely Able T Medications ake Completely Able Use T elephone Completely Able Care for Appearance Completely Able Use T oilet Completely Able Bath / Shower Need Assistance Dress Self Completely Able Feed Self Completely Able Walk Completely Able Get In / Out Bed Completely Able Housework Not Able Prepare Meals Not Able Handle Money Need Assistance Shop for Self Need Assistance Electronic Signature(s) Signed: 07/29/2023 12:52:25 PM By: [name] Entered By: [name] on  07/28/2023 13:20:58 [name] ([id number]) [id number] Page 2 of 4 -------------------------------------------------------------------------------- Education Screening Details Patient Name: Date of Service: [name], [name] 07/28/2023 12:45 PM Medical Record Number: [id number] Patient Account Number: [id number] Date of Birth/Sex: Treating RN: May 21, 1941 (82 y.o. M) Primary Care [name]: [name] Other Clinician: Referring [name]: Treating [name]/Extender: [name] in Treatment: 0 Primary Learner Assessed: Patient Learning Preferences/Education Level/Primary Language Learning Preference: Explanation, Demonstration, Printed Material Highest Education Level: College or Above Preferred Language: English Cognitive Barrier Language Barrier: No Translator Needed: No Memory Deficit: No Emotional Barrier: No Cultural/Religious Beliefs Affecting Medical Care: No Physical Barrier Impaired Vision: Yes readers Impaired Hearing: Yes hard of hearing Decreased Hand dexterity: No Knowledge/Comprehension Knowledge Level: High Comprehension Level: High Ability to understand written instructions: High Ability to understand verbal instructions: High Motivation Anxiety Level: Calm Cooperation: Cooperative Education Importance: Acknowledges Need Interest in Health Problems: Asks Questions Perception: Coherent Willingness to Engage in Self-Management High Activities: Readiness to Engage in Self-Management High Activities: Electronic Signature(s) Signed: 07/29/2023 12:52:25 PM By: [name] Entered By: [name] on 07/28/2023 13:23:22 -------------------------------------------------------------------------------- Fall Risk Assessment Details Patient Name: Date of Service: Frank Musa W. 07/28/2023 12:45 PM Medical Record Number: [id number] Patient Account Number: [id number] Date of Birth/Sex: Treating  RN: 05-04-1941 (82 y.o. M) Primary Care [name]: [name] Other Clinician: Referring [name]: Treating [name]/Extender: [name], [name] in Treatment: 0 Fall Risk Assessment Items Have you had 2 or more falls in the last [address] VIVEK, ORAHOOD ([id number]) [id number] Page 3 of 4 Have you had any fall that resulted in injury in the last 12 monthso 0 No FALLS RISK SCREEN History of falling - immediate or within 3 months 25 Yes Secondary diagnosis (Do you have 2 or more medical diagnoseso) 0 No Ambulatory aid None/bed rest/wheelchair/nurse 0 Yes Crutches/cane/walker 0 No Furniture 0 No  Intravenous therapy Access/Saline/Heparin Lock 0 No Gait/Transferring Normal/ bed rest/ wheelchair 0 Yes Weak (short steps with or without shuffle, stooped but able to lift head while walking, may seek 0 No support from furniture) Impaired (short steps with shuffle, may have difficulty arising from chair, head down, impaired 0 No balance) Mental Status Oriented to own ability 0 Yes Electronic Signature(s) Signed: 07/29/2023 12:52:25 PM By: [name] Entered By: [name] on 07/28/2023 13:23:53 -------------------------------------------------------------------------------- Foot Assessment Details Patient Name: Date of Service: Frank Musa W. 07/28/2023 12:45 PM Medical Record Number: [id number] Patient Account Number: [id number] Date of Birth/Sex: Treating RN: Dec 10, 1940 (82 y.o. [name] Primary Care Halil Rentz: [name] Other Clinician: Referring Tannisha Kennington: Treating Angely Dietz/Extender: [name], [name] in Treatment: 0 Foot Assessment Items Site Locations + = Sensation present, - = Sensation absent, C = Callus, U = Ulcer R = Redness, W = Warmth, M = Maceration, PU = Pre-ulcerative lesion F = Fissure, S = Swelling, D = Dryness Assessment Right: Left: Other Deformity: No No Prior Foot  Ulcer: No No Prior Amputation: No No Charcot Joint: No No Ambulatory Status: Ambulatory Without Help GaitBREYER, [name] ([id number]) [phone] [id number] Page 4 of 4 Electronic Signature(s) Signed: 07/28/2023 6:10:25 PM By: Shawn Stall RN, BSN Entered By: Shawn Stall on 07/28/2023 13:28:08 -------------------------------------------------------------------------------- Nutrition Risk Screening Details Patient Name: Date of Service: [name], [name] 07/28/2023 12:45 PM Medical Record Number: [id number] Patient Account Number: [id number] Date of Birth/Sex: Treating RN: June 21, 1941 (82 y.o. M) Primary Care Isiaha [place]: [name] Other Clinician: Referring Ryver Poblete: Treating Jamielyn Petrucci/Extender: [name], [name] in Treatment: 0 Height (in): 64 Weight (lbs): 189.4 Body Mass Index (BMI): 32.5 Nutrition Risk Screening Items Score Screening NUTRITION RISK SCREEN: I have an illness or condition that made me change the kind and/or amount of food I eat 2 Yes I eat fewer than two meals per day 0 No I eat few fruits and vegetables, or milk products 0 No I have three or more drinks of beer, liquor or wine almost every day 0 No I have tooth or mouth problems that make it hard for me to eat 0 No I don't always have enough money to buy the food I need 0 No I eat alone most of the time 0 No I take three or more different prescribed or over-the-counter drugs a day 1 Yes Without wanting to, I have lost or gained 10 pounds in the last six months 0 No I am not always physically able to shop, cook and/or feed myself 0 No Nutrition Protocols Good Risk Protocol Moderate Risk Protocol 0 Provide education on nutrition High Risk Proctocol Risk Level: Moderate Risk Score: 3 Electronic Signature(s) Signed: 07/29/2023 12:52:25 PM By: [name] Entered By: [name] on 07/28/2023 13:25:02

## 2023-08-01 NOTE — Progress Notes (Signed)
LAMARKUS, LOZOYA ([id number]) [id number] Page 1 of 10 Visit Report for 07/28/2023 Chief Complaint Document Details Patient Name: Date of Service: [name], [name] 07/28/2023 12:45 PM Medical Record Number: [id number] Patient Account Number: [id number] Date of Birth/Sex: Treating RN: 1941-05-03 (82 y.o. M) Primary Care Provider: Linus Galas Other Clinician: Referring Provider: Treating Provider/Extender: Tina Griffiths, Glade Stanford in Treatment: 0 Information Obtained from: Patient Chief Complaint 07/28/2023; patient is here for review of a sizable wound on the left lateral lower leg Electronic Signature(s) Signed: 07/28/2023 4:34:18 PM By: Baltazar Najjar MD Entered By: Baltazar Najjar on 07/28/2023 11:41:29 -------------------------------------------------------------------------------- Debridement Details Patient Name: Date of Service: Frank Musa [name] 07/28/2023 12:45 PM Medical Record Number: [id number] Patient Account Number: [id number] Date of Birth/Sex: Treating RN: 09/07/1941 (82 y.o. M) Primary Care Provider: Linus Galas Other Clinician: Referring Provider: Treating Provider/Extender: Tina Griffiths, Glade Stanford in Treatment: 0 Debridement Performed for Assessment: Wound #1 Left,Lateral Lower Leg Performed By: Physician Maxwell Caul., MD The following information was scribed by: Karie Schwalbe The information was scribed for: Baltazar Najjar Debridement Type: Debridement Severity of Tissue Pre Debridement: Fat layer exposed Level of Consciousness (Pre-procedure): Awake and Alert Pre-procedure Verification/Time Out Yes - 14:04 Taken: Start Time: 14:04 Pain Control: Lidocaine 4% T opical Solution Percent of Wound Bed Debrided: 100% T Area Debrided (cm): otal 85.76 Tissue and other material debrided: Viable, Non-Viable, Slough, Subcutaneous, Slough Level: Skin/Subcutaneous Tissue Debridement Description:  Excisional Instrument: Curette Bleeding: Minimum Hemostasis Achieved: Pressure Response to Treatment: Procedure was tolerated well Level of Consciousness (Post- Awake and Alert procedure): Post Debridement Measurements of Total Wound Length: (cm) 11.5 Width: (cm) 9.5 Depth: (cm) 0.4 Volume: (cm) 34.322 Character of Wound/Ulcer Post Debridement: Improved Severity of Tissue Post Debridement: Fat layer exposed [name] ([id number]) [id number] Page 2 of 10 Post Procedure Diagnosis Same as Pre-procedure Electronic Signature(s) Signed: 07/28/2023 4:34:18 PM By: Baltazar Najjar MD Entered By: Baltazar Najjar on 07/28/2023 11:40:23 -------------------------------------------------------------------------------- HPI Details Patient Name: Date of Service: Frank Musa [name] 07/28/2023 12:45 PM Medical Record Number: [id number] Patient Account Number: [id number] Date of Birth/Sex: Treating RN: 1941/05/08 (82 y.o. M) Primary Care Provider: Linus Galas Other Clinician: Referring Provider: Treating Provider/Extender: Tina Griffiths, Glade Stanford in Treatment: 0 History of Present Illness HPI Description: ADMISSION 07/28/2023. This is an 82 year old subsomewhat frail man who arrived accompanied by his wife and daughter. He has a large chronic ulcer on the left lateral lower leg. He was hospitalized from 10/30 through 07/23/2023 with a nonhealing wound in the left leg felt to have coexistent cellulitis. He was seen in the hospital by Dr. Lajoyce Corners. Discharged on Duricef and Flagyl. He is supposed to follow-up with Dr. Lajoyce Corners next week. He also saw a vein and vascular and did not feel he needed any intervention. I note that he had previously been seen earlier this year by Dr. [facility] at [facility]. Felt to have chronic venous hypertension and secondary lymphedema. At 1 point he was discovered to have maggots in this wound. Using Xeroform and T elfa. He  has home health going out to see him and there is supposed to come tomorrow. Past medical history includes paroxysmal atrial fibrillation, hypertension, type 2 diabetes, chronic kidney disease, known PAD, heart failure with preserved ejection fraction He had arterial studies done on 07/21/2023. On the right his ABI was 0.97 TBI of 0.48 but he had triphasic and wave biphasic waveforms. On the left his TBI was 0.61. I do not think  anything further was done. Electronic Signature(s) Signed: 07/28/2023 4:34:18 PM By: Baltazar Najjar MD Entered By: Baltazar Najjar on 07/28/2023 11:50:14 -------------------------------------------------------------------------------- Physical Exam Details Patient Name: Date of Service: Frank Musa [name] 07/28/2023 12:45 PM Medical Record Number: [id number] Patient Account Number: [id number] Date of Birth/Sex: Treating RN: January 09, 1941 (82 y.o. M) Primary Care Provider: Linus Galas Other Clinician: Referring Provider: Treating Provider/Extender: [name] in Treatment: 0 Constitutional Patient is hypertensive.. Pulse regular and within target range for patient.Marland Kitchen Respirations regular, non-labored and within target range.. Temperature is normal and within the target range for the patient.Marland Kitchen Appears in no distress. Ears, Nose, Mouth, and Throat Patient is hard of hearing.. Cardiovascular No obvious signs of congestive heart failure. JVP not elevated. Extensive pitting edema in the left leg. Nothing looks acute here.[name], [name] ([id number]) [id number] Page 3 of 10 Psychiatric Very anxious patiento Depression. Notes Wound exam; the patient has a deep punched-out wound on the left lateral calf with satellite lesion superiorly x 2. Most of this covered in a completely nonviable very adherent surface. I used a #5 curette for an extensive debridement which was not well-tolerated. Vigorous washing with Vashe.  Removing adherent slough and subcutaneous tissue. Hemostasis with direct pressure Electronic Signature(s) Signed: 07/28/2023 4:34:18 PM By: Baltazar Najjar MD Entered By: Baltazar Najjar on 07/28/2023 11:52:19 -------------------------------------------------------------------------------- Physician Orders Details Patient Name: Date of Service: Frank Musa [name] 07/28/2023 12:45 PM Medical Record Number: [id number] Patient Account Number: [id number] Date of Birth/Sex: Treating RN: 08/22/1941 (82 y.o. [name] Primary Care Provider: Linus Galas Other Clinician: Referring Provider: Treating Provider/Extender: [name] in Treatment: 0 Verbal / Phone Orders: No Diagnosis Coding Follow-up Appointments ppointment in 1 week. - Dr. Leanord Hawking Return A Anesthetic (In clinic) Topical Lidocaine 4% applied to wound bed Bathing/ Shower/ Hygiene May shower with protection but do not get wound dressing(s) wet. Protect dressing(s) with water repellant cover (for example, large plastic bag) or a cast cover and may then take shower. - Do not get the left leg wet. Keep the left leg dry until the next appointment. Edema Control - Orders / Instructions Elevate legs to the level of the heart or above for 30 minutes daily and/or when sitting for 3-4 times a day throughout the day. - As tolerated. Keep legs up when sitting/lying Avoid standing for long periods of time. Home Health New wound care orders this week; continue Home Health for wound care. May utilize formulary equivalent dressing for wound treatment orders unless otherwise specified. - TCA to periwound. Santyl to wound bed and Hydrafera Blue Ready over Santyl and then use cover wound - gauze,ABD pad and URGO K2 or 3 layer compression wraps Other Home Health Orders/Instructions: - [facility] Wound Treatment Wound #1 - Lower Leg Wound Laterality: Left, Lateral Cleanser: Soap and Water 2 x Per Week/30  Days Discharge Instructions: May shower and wash wound with dial antibacterial soap and water prior to dressing change. Topical: Triamcinolone 2 x Per Week/30 Days Discharge Instructions: Apply Triamcinolone as directed Prim Dressing: Hydrofera Blue Ready Transfer Foam, 4x5 (in/in) 2 x Per Week/30 Days ary Discharge Instructions: Apply to wound bed as instructed Prim Dressing: Santyl Ointment 2 x Per Week/30 Days ary Discharge Instructions: Apply nickel thick amount to wound bed as instructed Secondary Dressing: ABD Pad, 8x10 2 x Per Week/30 Days Discharge Instructions: Apply over primary dressing as directed. Secondary Dressing: Woven Gauze Sponge, Non-Sterile 4x4 in 2 x Per Week/30 Days Discharge  Instructions: Apply over primary dressing as directed. Compression Wrap: Urgo K2 Lite, (equivalent to a 3 layer) two layer compression system, regular 2 x Per Week/30 Days Discharge Instructions: Apply Urgo K2 Lite as directed (alternative to 3 layer compression). Compression Wrap: ThreePress (3 layer compression wrap) 2 x Per Week/30 Days [name], [name] ([id number]) [id number] Page 4 of 10 Discharge Instructions: or Apply three layer compression as directed. Electronic Signature(s) Signed: 07/28/2023 4:34:18 PM By: Baltazar Najjar MD Signed: 07/28/2023 6:28:11 PM By: Karie Schwalbe RN Entered By: Karie Schwalbe on 07/28/2023 11:19:55 -------------------------------------------------------------------------------- Problem List Details Patient Name: Date of Service: Frank Musa [name] 07/28/2023 12:45 PM Medical Record Number: [id number] Patient Account Number: [id number] Date of Birth/Sex: Treating RN: 1940-12-16 (82 y.o. M) Primary Care Provider: Linus Galas Other Clinician: Referring Provider: Treating Provider/Extender: Tina Griffiths, Glade Stanford in Treatment: 0 Active Problems ICD-10 Encounter Code Description Active Date MDM Diagnosis I87.332  Chronic venous hypertension (idiopathic) with ulcer and inflammation of left 07/28/2023 No Yes lower extremity L97.828 Non-pressure chronic ulcer of other part of left lower leg with other specified 07/28/2023 No Yes severity L03.116 Cellulitis of left lower limb 07/28/2023 No Yes E11.51 Type 2 diabetes mellitus with diabetic peripheral angiopathy without gangrene 07/28/2023 No Yes Inactive Problems Resolved Problems Electronic Signature(s) Signed: 07/28/2023 4:34:18 PM By: Baltazar Najjar MD Entered By: Baltazar Najjar on 07/28/2023 11:56:02 -------------------------------------------------------------------------------- Progress Note Details Patient Name: Date of Service: Frank Musa [name] 07/28/2023 12:45 PM Medical Record Number: [id number] Patient Account Number: [id number] Date of Birth/Sex: Treating RN: [birth year] (82 y.o. M) Primary Care Provider: Linus Galas Other Clinician: Referring Provider: Treating Provider/Extender: [name] in Treatment: 0 [name], [name] ([id number]) [id number] Page 5 of 10 Subjective Chief Complaint Information obtained from Patient 07/28/2023; patient is here for review of a sizable wound on the left lateral lower leg History of Present Illness (HPI) ADMISSION 07/28/2023. This is an 82 year old subsomewhat frail man who arrived accompanied by his wife and daughter. He has a large chronic ulcer on the left lateral lower leg. He was hospitalized from 10/30 through 07/23/2023 with a nonhealing wound in the left leg felt to have coexistent cellulitis. He was seen in the hospital by Dr. Lajoyce Corners. Discharged on Duricef and Flagyl. He is supposed to follow-up with Dr. Lajoyce Corners next week. He also saw a vein and vascular and did not feel he needed any intervention. I note that he had previously been seen earlier this year by Dr. Shepard [facility] at [facility]. Felt to have chronic venous hypertension and secondary  lymphedema. At 1 point he was discovered to have maggots in this wound. Using Xeroform and T elfa. He has home health going out to see him and there is supposed to come tomorrow. Past medical history includes paroxysmal atrial fibrillation, hypertension, type 2 diabetes, chronic kidney disease, known PAD, heart failure with preserved ejection fraction He had arterial studies done on 07/21/2023. On the right his ABI was 0.97 TBI of 0.48 but he had triphasic and wave biphasic waveforms. On the left his TBI was 0.61. I do not think anything further was done. Patient History Information obtained from Chart. Allergies No Known Allergies Family History Cancer - Mother,Maternal Grandparents, Diabetes - Mother, Heart Disease - Paternal Grandparents, Hypertension - Paternal Grandparents, Stroke - Paternal Grandparents,Father, Thyroid Problems - Mother, No family history of Hereditary Spherocytosis, Kidney Disease, Lung Disease, Seizures, Tuberculosis. Social History Never smoker, Marital Status - Married, Alcohol Use - Never, Drug Use - No  History, Caffeine Use - Daily. Medical History Eyes Patient has history of Cataracts - removed Denies history of Glaucoma, Optic Neuritis Ear/Nose/Mouth/Throat Denies history of Chronic sinus problems/congestion, Middle ear problems Hematologic/Lymphatic Denies history of Anemia, Hemophilia, Human Immunodeficiency Virus, Lymphedema, Sickle Cell Disease Respiratory Denies history of Aspiration, Asthma, Chronic Obstructive Pulmonary Disease (COPD), Pneumothorax, Sleep Apnea, Tuberculosis Cardiovascular Patient has history of Arrhythmia - A.Fib, Congestive Heart Failure, Hypertension Denies history of Angina, Coronary Artery Disease, Deep Vein Thrombosis, Hypotension, Myocardial Infarction, Peripheral Arterial Disease, Peripheral Venous Disease, Phlebitis, Vasculitis Gastrointestinal Denies history of Cirrhosis , Colitis, Crohns, Hepatitis A, Hepatitis B,  Hepatitis C Genitourinary Denies history of End Stage Renal Disease Immunological Denies history of Lupus Erythematosus, Raynauds, Scleroderma Integumentary (Skin) Denies history of History of Burn Musculoskeletal Patient has history of Gout Denies history of Rheumatoid Arthritis, Osteoarthritis, Osteomyelitis Neurologic Denies history of Dementia, Neuropathy, Quadriplegia, Paraplegia, Seizure Disorder Oncologic Denies history of Received Chemotherapy, Received Radiation Psychiatric Denies history of Anorexia/bulimia, Confinement Anxiety Hospitalization/Surgery History - tonsillectomy. - transurethral resction of prostate. - vasectomy. Medical A Surgical History Notes nd Respiratory CAP (community acquired PNA) Genitourinary CKD stage 4 (GFR 15-29 ml/min) Benign Prostatic Hyperplasia,Hematuria Review of Systems (ROS) Eyes Complains or has symptoms of Glasses / Contacts - readers. Denies complaints or symptoms of Vision Changes. Ear/Nose/Mouth/Throat Denies complaints or symptoms of Chronic sinus problems or rhinitis. Respiratory Denies complaints or symptoms of Chronic or frequent coughs, Shortness of Breath. Gastrointestinal Denies complaints or symptoms of Frequent diarrhea, Nausea, Vomiting. Endocrine [name], [name] ([id number]) [id number] Page 6 of 10 Denies complaints or symptoms of Heat/cold intolerance. Genitourinary Denies complaints or symptoms of Frequent urination. Integumentary (Skin) Complains or has symptoms of Wounds - left leg. Neurologic Denies complaints or symptoms of Numbness/parasthesias. Psychiatric Denies complaints or symptoms of Claustrophobia. Objective Constitutional Patient is hypertensive.. Pulse regular and within target range for patient.Marland Kitchen Respirations regular, non-labored and within target range.. Temperature is normal and within the target range for the patient.Marland Kitchen Appears in no distress. Vitals Time Taken:  1:04 PM, Height: 64 in, Source: Stated, Weight: 189.4 lbs, Source: Stated, BMI: 32.5, Temperature: 98.2 F, Pulse: 55 bpm, Respiratory Rate: 18 breaths/min, Blood Pressure: 153/82 mmHg. Ears, Nose, Mouth, and Throat Patient is hard of hearing.. Cardiovascular No obvious signs of congestive heart failure. JVP not elevated. Extensive pitting edema in the left leg. Nothing looks acute here.Marland Kitchen Psychiatric Very anxious patiento Depression. General Notes: Wound exam; the patient has a deep punched-out wound on the left lateral calf with satellite lesion superiorly x 2. Most of this covered in a completely nonviable very adherent surface. I used a #5 curette for an extensive debridement which was not well-tolerated. Vigorous washing with Vashe. Removing adherent slough and subcutaneous tissue. Hemostasis with direct pressure Integumentary (Hair, Skin) Wound #1 status is Open. Original cause of wound was Bump. The date acquired was: 06/13/2023. The wound is located on the Left,Lateral Lower Leg. The wound measures 11.5cm length x 9.5cm width x 0.4cm depth; 85.805cm^2 area and 34.322cm^3 volume. There is Fat Layer (Subcutaneous Tissue) exposed. There is no tunneling or undermining noted. There is a large amount of purulent drainage noted. The wound margin is distinct with the outline attached to the wound base. There is small (1-33%) red granulation within the wound bed. There is a large (67-100%) amount of necrotic tissue within the wound bed including Adherent Slough. The periwound skin appearance exhibited: Maceration, Hemosiderin Staining. The periwound skin appearance did not exhibit: Callus, Crepitus, Excoriation, Induration, Rash, Scarring, Dry/Scaly, Atrophie  Blanche, Cyanosis, Ecchymosis, Mottled, Pallor, Rubor, Erythema. Periwound temperature was noted as No Abnormality. Assessment Active Problems ICD-10 Chronic venous hypertension (idiopathic) with ulcer and inflammation of left lower  extremity Non-pressure chronic ulcer of other part of left lower leg with other specified severity Cellulitis of left lower limb Procedures Wound #1 Pre-procedure diagnosis of Wound #1 is a Venous Leg Ulcer located on the Left,Lateral Lower Leg .Severity of Tissue Pre Debridement is: Fat layer exposed. There was a Excisional Skin/Subcutaneous Tissue Debridement with a total area of 85.76 sq cm performed by Maxwell Caul., MD. With the following instrument(s): Curette to remove Viable and Non-Viable tissue/material. Material removed includes Subcutaneous Tissue and Slough and after achieving pain control using Lidocaine 4% T opical Solution. No specimens were taken. A time out was conducted at 14:04, prior to the start of the procedure. A Minimum amount of bleeding was controlled with Pressure. The procedure was tolerated well. Post Debridement Measurements: 11.5cm length x 9.5cm width x 0.4cm depth; 34.322cm^3 volume. Character of Wound/Ulcer Post Debridement is improved. Severity of Tissue Post Debridement is: Fat layer exposed. Post procedure Diagnosis Wound #1: Same as Pre-Procedure Pre-procedure diagnosis of Wound #1 is a Venous Leg Ulcer located on the Left,Lateral Lower Leg . There was a Three Layer Compression Therapy Procedure by Karie Schwalbe, RN. Post procedure Diagnosis Wound #1: Same as Pre-Procedure [name], [name] ([id number]) [id number] Page 7 of 10 Plan Follow-up Appointments: Return Appointment in 1 week. - Dr. Leanord Hawking Anesthetic: (In clinic) Topical Lidocaine 4% applied to wound bed Bathing/ Shower/ Hygiene: May shower with protection but do not get wound dressing(s) wet. Protect dressing(s) with water repellant cover (for example, large plastic bag) or a cast cover and may then take shower. - Do not get the left leg wet. Keep the left leg dry until the next appointment. Edema Control - Orders / Instructions: Elevate legs to the level of  the heart or above for 30 minutes daily and/or when sitting for 3-4 times a day throughout the day. - As tolerated. Keep legs up when sitting/lying Avoid standing for long periods of time. Home Health: New wound care orders this week; continue Home Health for wound care. May utilize formulary equivalent dressing for wound treatment orders unless otherwise specified. - TCA to periwound. Santyl to wound bed and Hydrafera Blue Ready over Santyl and then use cover wound -gauze,ABD pad and URGO K2 or 3 layer compression wraps Other Home Health Orders/Instructions: - [facility] WOUND #1: - Lower Leg Wound Laterality: Left, Lateral Cleanser: Soap and Water 2 x Per Week/30 Days Discharge Instructions: May shower and wash wound with dial antibacterial soap and water prior to dressing change. Topical: Triamcinolone 2 x Per Week/30 Days Discharge Instructions: Apply Triamcinolone as directed Prim Dressing: Hydrofera Blue Ready Transfer Foam, 4x5 (in/in) 2 x Per Week/30 Days ary Discharge Instructions: Apply to wound bed as instructed Prim Dressing: Santyl Ointment 2 x Per Week/30 Days ary Discharge Instructions: Apply nickel thick amount to wound bed as instructed Secondary Dressing: ABD Pad, 8x10 2 x Per Week/30 Days Discharge Instructions: Apply over primary dressing as directed. Secondary Dressing: Woven Gauze Sponge, Non-Sterile 4x4 in 2 x Per Week/30 Days Discharge Instructions: Apply over primary dressing as directed. Com pression Wrap: Urgo K2 Lite, (equivalent to a 3 layer) two layer compression system, regular 2 x Per Week/30 Days Discharge Instructions: Apply Urgo K2 Lite as directed (alternative to 3 layer compression). Com pression Wrap: ThreePress (3 layer compression wrap) 2 x  Per Week/30 Days Discharge Instructions: or Apply three layer compression as directed. 1. Large wound on the left lateral lower leg secondary to chronic venous insufficiency. 2. Completely nonviable  surface. He required debridement and will require ongoing debridement 3. At 1 point he had maggots in this wound there is no evidence of that currently he is on antibiotics for coexistent cellulitis from his recent hospitalization. I do not see any evidence of that currently he can complete his current antibiotics 4. The patient is going to need compression to control the swelling in this leg over there is no hope of healing this. We put him in an Urgo K2 lite. We used Santyl and Hydrofera Blue on the wound. I am not optimistic he will leave this alone. 5. He also sleeps sitting on the couch with his legs dependent. I talked about keeping his legs elevated I do not think his family is optimistic they are going to be able to do this. 6. Uncertain whether what I am seeing in terms of his mental status is depression with anxiety or underlying cognitive issues. 7. We will try to get orders to home health. They are supposed to come out tomorrow to see him but probably would be a better Monday. 8. The patient has PAD and is a diabetic however what I can understand from his arterial studies I think he has enough distal flow to use have a 3 layer compression. We applied that today Electronic Signature(s) Signed: 07/28/2023 4:34:18 PM By: Baltazar Najjar MD Entered By: Baltazar Najjar on 07/28/2023 11:56:51 -------------------------------------------------------------------------------- HxROS Details Patient Name: Date of Service: Frank Musa [name] 07/28/2023 12:45 PM Medical Record Number: [id number] Patient Account Number: [id number] Date of Birth/Sex: Treating RN: [birth year] (82 y.o. [name] Primary Care Provider: Linus Galas Other Clinician: Referring Provider: Treating Provider/Extender: [name] in Treatment: 0 Information Obtained From Chart Eyes Complaints and Symptoms: Positive for: Glasses / Contacts - readers Negative for: Vision Changes DEMARQUES, FREELOVE ([id number]) [id number] Page 8 of 10 Medical History: Positive for: Cataracts - removed Negative for: Glaucoma; Optic Neuritis Ear/Nose/Mouth/Throat Complaints and Symptoms: Negative for: Chronic sinus problems or rhinitis Medical History: Negative for: Chronic sinus problems/congestion; Middle ear problems Respiratory Complaints and Symptoms: Negative for: Chronic or frequent coughs; Shortness of Breath Medical History: Negative for: Aspiration; Asthma; Chronic Obstructive Pulmonary Disease (COPD); Pneumothorax; Sleep Apnea; Tuberculosis Past Medical History Notes: CAP (community acquired PNA) Gastrointestinal Complaints and Symptoms: Negative for: Frequent diarrhea; Nausea; Vomiting Medical History: Negative for: Cirrhosis ; Colitis; Crohns; Hepatitis A; Hepatitis B; Hepatitis C Endocrine Complaints and Symptoms: Negative for: Heat/cold intolerance Genitourinary Complaints and Symptoms: Negative for: Frequent urination Medical History: Negative for: End Stage Renal Disease Past Medical History Notes: CKD stage 4 (GFR 15-29 ml/min) Benign Prostatic Hyperplasia,Hematuria Integumentary (Skin) Complaints and Symptoms: Positive for: Wounds - left leg Medical History: Negative for: History of Burn Neurologic Complaints and Symptoms: Negative for: Numbness/parasthesias Medical History: Negative for: Dementia; Neuropathy; Quadriplegia; Paraplegia; Seizure Disorder Psychiatric Complaints and Symptoms: Negative for: Claustrophobia Medical History: Negative for: Anorexia/bulimia; Confinement Anxiety Hematologic/Lymphatic Medical History: Negative for: Anemia; Hemophilia; Human Immunodeficiency Virus; Lymphedema; Sickle Cell Disease Cardiovascular Medical History: Positive for: Arrhythmia - A.Fib; Congestive Heart Failure; Hypertension Negative for: Angina; Coronary Artery Disease; Deep Vein Thrombosis; Hypotension; Myocardial Infarction;  Peripheral Arterial Disease; Peripheral Venous Disease; Phlebitis; Vasculitis MASTER, MCKEE ([id number]) [id number] Page 9 of 10 Immunological Medical History: Negative for: Lupus Erythematosus; Raynauds; Scleroderma Musculoskeletal Medical History: Positive for: Gout  Negative for: Rheumatoid Arthritis; Osteoarthritis; Osteomyelitis Oncologic Medical History: Negative for: Received Chemotherapy; Received Radiation HBO Extended History Items Eyes: Cataracts Immunizations Pneumococcal Vaccine: Received Pneumococcal Vaccination: Yes Received Pneumococcal Vaccination On or After 60th Birthday: Yes Implantable Devices No devices added Hospitalization / Surgery History Type of Hospitalization/Surgery tonsillectomy transurethral resction of prostate vasectomy Family and Social History Cancer: Yes - Mother,Maternal Grandparents; Diabetes: Yes - Mother; Heart Disease: Yes - Paternal Grandparents; Hereditary Spherocytosis: No; Hypertension: Yes - Paternal Grandparents; Kidney Disease: No; Lung Disease: No; Seizures: No; Stroke: Yes - Paternal Grandparents,Father; Thyroid Problems: Yes - Mother; Tuberculosis: No; Never smoker; Marital Status - Married; Alcohol Use: Never; Drug Use: No History; Caffeine Use: Daily; Financial Concerns: No; Food, Clothing or Shelter Needs: No; Support System Lacking: No; Transportation Concerns: No Electronic Signature(s) Signed: 07/28/2023 4:34:18 PM By: Baltazar Najjar MD Signed: 07/28/2023 6:28:11 PM By: Karie Schwalbe RN Signed: 07/29/2023 12:52:25 PM By: Thayer Dallas Entered By: Thayer Dallas on 07/28/2023 10:18:50 -------------------------------------------------------------------------------- SuperBill Details Patient Name: Date of Service: Frank Musa [name] 07/28/2023 Medical Record Number: [id number] Patient Account Number: [id number] Date of Birth/Sex: Treating RN: August 31, 1941 (82 y.o. M) Primary Care Provider:  Linus Galas Other Clinician: Referring Provider: Treating Provider/Extender: Tina Griffiths, Glade Stanford in Treatment: 0 Diagnosis Coding ICD-10 Codes Code Description [phone] Chronic venous hypertension (idiopathic) with ulcer and inflammation of left lower extremity L97.828 Non-pressure chronic ulcer of other part of left lower leg with other specified severity L03.116 Cellulitis of left lower limb Facility Procedures JODECI, CINK ([id number]): CPT4 Code Description [id number] [phone] - WOUND CARE VISIT-LEV 3 EST PT [phone].pdf Page 10 of 10: Modifier Quantity 1 ISAM, ZANGER [name] ([id number]): [id number] [id number] - DEB SUBQ TISSUE 20 SQ CM/< ICD-10 Diagnosis Description L97.828 Non-pressure chronic ulcer of other part of left lower leg with o [phone].pdf Page 10 of 10: 1 ther specified severity VANDER, YAP ([id number]): [id number] 11045 - DEB SUBQ TISS EA ADDL 20CM ICD-10 Diagnosis Description L97.828 Non-pressure chronic ulcer of other part of left lower leg with o [phone].pdf Page 10 of 10: 4 ther specified severity Physician Procedures : CPT4 Code Description Modifier [id number] WC PHYS LEVEL 3 NEW PT 25 ICD-10 Diagnosis Description I87.332 Chronic venous hypertension (idiopathic) with ulcer and inflammation of left lower extremity L97.828 Non-pressure chronic ulcer of other part of left  lower leg with other specified severity L03.116 Cellulitis of left lower limb Quantity: 1 : [id number] 11042 - WC PHYS SUBQ TISS 20 SQ CM ICD-10 Diagnosis Description L97.828 Non-pressure chronic ulcer of other part of left lower leg with other specified severity Quantity: 1 : [phone] 11045 - WC PHYS SUBQ TISS EA ADDL 20 CM ICD-10 Diagnosis Description L97.828 Non-pressure chronic ulcer of other part of left lower leg with other specified severity Quantity: 4 Electronic Signature(s) Signed: 07/28/2023 6:28:11 PM By: Karie Schwalbe RN Signed: 08/01/2023 4:37:17 PM By: Baltazar Najjar MD Previous Signature: 07/28/2023 4:34:18 PM Version By: Baltazar Najjar MD Entered By: Karie Schwalbe on 07/28/2023 15:15:19

## 2023-08-02 ENCOUNTER — Telehealth: Payer: Self-pay

## 2023-08-02 NOTE — Transitions of Care (Post Inpatient/ED Visit) (Signed)
   08/02/2023  Name: [name] MRN: [id number] DOB: 1941/01/06  Today's TOC FU Call Status: Today's TOC FU Call Status:Unsuccessful Call (3rd Attempt)  Unsuccessful Call (3rd Attempt) Date: 08/02/23  Attempted to reach the patient regarding the most recent Inpatient/ED visit.  Follow Up Plan: No further outreach attempts will be made at this time. We have been unable to contact the patient. TOC RN name and phone number provided on voice message should patient desire further outreach.  Nolton Denis Daphine Deutscher BSN, RN RN Care Manager   Transitions of Care [facility] - [facility] Direct Dial Number:  [phone]

## 2023-08-03 ENCOUNTER — Encounter (HOSPITAL_BASED_OUTPATIENT_CLINIC_OR_DEPARTMENT_OTHER): Payer: Medicare Other | Admitting: Internal Medicine

## 2023-08-03 DIAGNOSIS — E11621 Type 2 diabetes mellitus with foot ulcer: Secondary | ICD-10-CM | POA: Diagnosis not present

## 2023-08-04 ENCOUNTER — Ambulatory Visit: Payer: Medicare Other | Admitting: Orthopedic Surgery

## 2023-08-04 NOTE — Progress Notes (Signed)
EDMAR, IGARASHI ([id number]) [id number] Page 1 of 7 Visit Report for 08/03/2023 Debridement Details Patient Name: Date of Service: Frank, [name] 08/03/2023 8:15 A M Medical Record Number: [id number] Patient Account Number: [id number] Date of Birth/Sex: Treating RN: [birth year] (82 y.o. M) Primary Care Provider: Linus Galas Other Clinician: Referring Provider: Treating Provider/Extender: Tina Griffiths, Glade Stanford in Treatment: 0 Debridement Performed for Assessment: Wound #1 Left,Lateral Lower Leg Performed By: Physician Maxwell Caul., MD The following information was scribed by: Redmond Pulling The information was scribed for: Baltazar Najjar Debridement Type: Debridement Severity of Tissue Pre Debridement: Fat layer exposed Level of Consciousness (Pre-procedure): Awake and Alert Pre-procedure Verification/Time Out Yes - 08:42 Taken: Start Time: 08:42 Pain Control: Lidocaine 5% topical ointment Percent of Wound Bed Debrided: 100% T Area Debrided (cm): otal 55.11 Tissue and other material debrided: Non-Viable, Slough, Subcutaneous, Slough Level: Skin/Subcutaneous Tissue Debridement Description: Excisional Instrument: Curette Bleeding: Minimum Hemostasis Achieved: Pressure Response to Treatment: Procedure was tolerated well Level of Consciousness (Post- Awake and Alert procedure): Post Debridement Measurements of Total Wound Length: (cm) 11.7 Width: (cm) 6 Depth: (cm) 0.3 Volume: (cm) 16.54 Character of Wound/Ulcer Post Debridement: Improved Severity of Tissue Post Debridement: Fat layer exposed Post Procedure Diagnosis Same as Pre-procedure Electronic Signature(s) Signed: 08/04/2023 4:06:32 PM By: Baltazar Najjar MD Entered By: Baltazar Najjar on 08/03/2023 05:46:32 -------------------------------------------------------------------------------- HPI Details Patient Name: Date of Service: [name] W. 08/03/2023 8:15 A  M Medical Record Number: [id number] Patient Account Number: [id number] Date of Birth/Sex: Treating RN: 01-24-1941 (82 y.o. M) Primary Care Provider: Linus Galas Other Clinician: Referring Provider: Treating Provider/Extender: [name] in Treatment: 0 History of Present Illness Frank, [name] ([id number]) [id number] Page 2 of 7 HPI Description: ADMISSION 07/28/2023. This is an 82 year old subsomewhat frail man who arrived accompanied by his wife and daughter. He has a large chronic ulcer on the left lateral lower leg. He was hospitalized from 10/30 through 07/23/2023 with a nonhealing wound in the left leg felt to have coexistent cellulitis. He was seen in the hospital by Dr. Lajoyce Corners. Discharged on Duricef and Flagyl. He is supposed to follow-up with Dr. Lajoyce Corners next week. He also saw a vein and vascular and did not feel he needed any intervention. I note that he had previously been seen earlier this year by Dr. [facility] at [facility]. Felt to have chronic venous hypertension and secondary lymphedema. At 1 point he was discovered to have maggots in this wound. Using Xeroform and T elfa. He has home health going out to see him and there is supposed to come tomorrow. Past medical history includes paroxysmal atrial fibrillation, hypertension, type 2 diabetes, chronic kidney disease, known PAD, heart failure with preserved ejection fraction He had arterial studies done on 07/21/2023. On the right his ABI was 0.97 TBI of 0.48 but he had triphasic and wave biphasic waveforms. On the left his TBI was 0.61. I do not think anything further was done. 11/13; large chronic venous insufficiency wound on the left lateral lower leg with 2 small superior satellite lesions. We used Santyl and Hydrofera Blue under and Urgo K2 light compression. He has some degree of PAD which is the reason for the Urgo K2 lite's. He seems to have tolerated this  surprisingly well. He has completed his antibiotics Electronic Signature(s) Signed: 08/04/2023 4:06:32 PM By: Baltazar Najjar MD Entered By: Baltazar Najjar on 08/03/2023 05:48:06 -------------------------------------------------------------------------------- Physical Exam Details Patient Name: Date of Service: [name], Frank Stage W.  08/03/2023 8:15 A M Medical Record Number: [id number] Patient Account Number: [id number] Date of Birth/Sex: Treating RN: Mar 22, 1941 (82 y.o. M) Primary Care Provider: Linus Galas Other Clinician: Referring Provider: Treating Provider/Extender: [name] in Treatment: 0 Constitutional Patient is hypertensive.. Pulse regular and within target range for patient.Marland Kitchen Respirations regular, non-labored and within target range.. Temperature is normal and within the target range for the patient.Marland Kitchen Appears in no distress. Cardiovascular Edema control is good. Surrounding inflammation in the lower leg is better. Notes Wound exam; deep punched-out wound on the left lateral calf with 2 small satellite lesion superiorly. Better looking wound surface this week but still requiring mechanical debridement removing of very fibrinous adherent slough and some subcutaneous tissue. Hemostasis with direct pressure. Fortunately no evidence of surrounding infection and the degree of stasis dermatitis is improved Electronic Signature(s) Signed: 08/04/2023 4:06:32 PM By: Baltazar Najjar MD Entered By: Baltazar Najjar on 08/03/2023 05:49:51 -------------------------------------------------------------------------------- Physician Orders Details Patient Name: Date of Service: [name] W. 08/03/2023 8:15 A M Medical Record Number: [id number] Patient Account Number: [id number] Date of Birth/Sex: Treating RN: September 24, 1940 (82 y.o. [name] Primary Care Provider: Linus Galas Other Clinician: Referring Provider: Treating Provider/Extender: [name] in Treatment: 0 Verbal / Phone Orders: No [name] ([id number]) [id number] Page 3 of 7 Diagnosis Coding Follow-up Appointments ppointment in 1 week. - Dr. Leanord Hawking Return A Anesthetic (In clinic) Topical Lidocaine 5% applied to wound bed Bathing/ Shower/ Hygiene May shower with protection but do not get wound dressing(s) wet. Protect dressing(s) with water repellant cover (for example, large plastic bag) or a cast cover and may then take shower. - Do not get the left leg wet. Keep the left leg dry until the next appointment. Edema Control - Orders / Instructions Elevate legs to the level of the heart or above for 30 minutes daily and/or when sitting for 3-4 times a day throughout the day. - As tolerated. Keep legs up when sitting/lying Avoid standing for long periods of time. Home Health New wound care orders this week; continue Home Health for wound care. May utilize formulary equivalent dressing for wound treatment orders unless otherwise specified. - TCA to periwound. Santyl to wound bed and Hydrafera Blue Ready over Santyl and then use cover wound - gauze,ABD pad and URGO K2 or 3 layer compression wraps Other Home Health Orders/Instructions: - [facility] Wound Treatment Wound #1 - Lower Leg Wound Laterality: Left, Lateral Cleanser: Soap and Water 2 x Per Week/30 Days Discharge Instructions: May shower and wash wound with dial antibacterial soap and water prior to dressing change. Topical: Triamcinolone 2 x Per Week/30 Days Discharge Instructions: Apply Triamcinolone as directed Prim Dressing: Hydrofera Blue Ready Transfer Foam, 4x5 (in/in) 2 x Per Week/30 Days ary Discharge Instructions: Apply to wound bed as instructed Prim Dressing: Santyl Ointment 2 x Per Week/30 Days ary Discharge Instructions: Apply nickel thick amount to wound bed as instructed Secondary Dressing: ABD Pad, 8x10 2 x Per Week/30  Days Discharge Instructions: Apply over primary dressing as directed. Secondary Dressing: Woven Gauze Sponge, Non-Sterile 4x4 in 2 x Per Week/30 Days Discharge Instructions: Apply over primary dressing as directed. Compression Wrap: Urgo K2 Lite, (equivalent to a 3 layer) two layer compression system, regular 2 x Per Week/30 Days Discharge Instructions: Apply Urgo K2 Lite as directed (alternative to 3 layer compression). Compression Wrap: ThreePress (3 layer compression wrap) 2 x Per Week/30 Days Discharge Instructions: or Apply three layer compression  as directed. Patient Medications llergies: No Known Allergies A Notifications Medication Indication Start End 08/03/2023 lidocaine DOSE topical 5 % ointment - ointment topical once daily Electronic Signature(s) Signed: 08/03/2023 3:50:36 PM By: Redmond Pulling RN, BSN Signed: 08/04/2023 4:06:32 PM By: Baltazar Najjar MD Entered By: Redmond Pulling on 08/03/2023 05:44:49 -------------------------------------------------------------------------------- Problem List Details Patient Name: Date of Service: [name] W. 08/03/2023 8:15 A M Medical Record Number: [id number] Patient Account Number: [id number] Date of Birth/Sex: Treating RN: [birth year] (82 y.o. Orison Acy, Ellene Route ([id number]) [id number] Page 4 of 7 Primary Care Provider: Linus Galas Other Clinician: Referring Provider: Treating Provider/Extender: Tina Griffiths, Glade Stanford in Treatment: 0 Active Problems ICD-10 Encounter Code Description Active Date MDM Diagnosis I87.332 Chronic venous hypertension (idiopathic) with ulcer and inflammation of left 07/28/2023 No Yes lower extremity L97.828 Non-pressure chronic ulcer of other part of left lower leg with other specified 07/28/2023 No Yes severity E11.51 Type 2 diabetes mellitus with diabetic peripheral angiopathy without gangrene 07/28/2023 No Yes Inactive Problems ICD-10 Code Description  Active Date Inactive Date L03.116 Cellulitis of left lower limb 07/28/2023 07/28/2023 Resolved Problems Electronic Signature(s) Signed: 08/04/2023 4:06:32 PM By: Baltazar Najjar MD Entered By: Baltazar Najjar on 08/03/2023 05:46:07 -------------------------------------------------------------------------------- Progress Note Details Patient Name: Date of Service: [name] W. 08/03/2023 8:15 A M Medical Record Number: [id number] Patient Account Number: [id number] Date of Birth/Sex: Treating RN: 1941-07-21 (82 y.o. M) Primary Care Provider: Linus Galas Other Clinician: Referring Provider: Treating Provider/Extender: Tina Griffiths, Glade Stanford in Treatment: 0 Subjective History of Present Illness (HPI) ADMISSION 07/28/2023. This is an 82 year old subsomewhat frail man who arrived accompanied by his wife and daughter. He has a large chronic ulcer on the left lateral lower leg. He was hospitalized from 10/30 through 07/23/2023 with a nonhealing wound in the left leg felt to have coexistent cellulitis. He was seen in the hospital by Dr. Lajoyce Corners. Discharged on Duricef and Flagyl. He is supposed to follow-up with Dr. Lajoyce Corners next week. He also saw a vein and vascular and did not feel he needed any intervention. I note that he had previously been seen earlier this year by Dr. Shepard [facility] at [facility]. Felt to have chronic venous hypertension and secondary lymphedema. At 1 point he was discovered to have maggots in this wound. Using Xeroform and T elfa. He has home health going out to see him and there is supposed to come tomorrow. Past medical history includes paroxysmal atrial fibrillation, hypertension, type 2 diabetes, chronic kidney disease, known PAD, heart failure with preserved ejection fraction He had arterial studies done on 07/21/2023. On the right his ABI was 0.97 TBI of 0.48 but he had triphasic and wave biphasic waveforms. On the left his TBI was 0.61. I do not think  anything further was done. 11/13; large chronic venous insufficiency wound on the left lateral lower leg with 2 small superior satellite lesions. We used Santyl and Hydrofera Blue under and Urgo K2 light compression. He has some degree of PAD which is the reason for the Urgo K2 lite's. He seems to have tolerated this surprisingly well. He has completed his antibiotics RASHEE, GLADBACH ([id number]) [id number] Page 5 of 7 Objective Constitutional Patient is hypertensive.. Pulse regular and within target range for patient.Marland Kitchen Respirations regular, non-labored and within target range.. Temperature is normal and within the target range for the patient.Marland Kitchen Appears in no distress. Vitals Time Taken: 8:29 AM, Height: 64 in, Weight: 189.4 lbs, BMI: 32.5, Temperature: 98.1 F, Pulse: 47 bpm,  Respiratory Rate: 18 breaths/min, Blood Pressure: 192/97 mmHg. Cardiovascular Edema control is good. Surrounding inflammation in the lower leg is better. General Notes: Wound exam; deep punched-out wound on the left lateral calf with 2 small satellite lesion superiorly. Better looking wound surface this week but still requiring mechanical debridement removing of very fibrinous adherent slough and some subcutaneous tissue. Hemostasis with direct pressure. Fortunately no evidence of surrounding infection and the degree of stasis dermatitis is improved Integumentary (Hair, Skin) Wound #1 status is Open. Original cause of wound was Bump. The date acquired was: 06/13/2023. The wound is located on the Left,Lateral Lower Leg. The wound measures 11.7cm length x 6cm width x 0.3cm depth; 55.135cm^2 area and 16.54cm^3 volume. There is Fat Layer (Subcutaneous Tissue) exposed. There is no tunneling or undermining noted. There is a large amount of purulent drainage noted. The wound margin is distinct with the outline attached to the wound base. There is medium (34-66%) red granulation within the wound bed. There  is a medium (34-66%) amount of necrotic tissue within the wound bed including Adherent Slough. The periwound skin appearance exhibited: Maceration, Hemosiderin Staining. The periwound skin appearance did not exhibit: Callus, Crepitus, Excoriation, Induration, Rash, Scarring, Dry/Scaly, Atrophie Blanche, Cyanosis, Ecchymosis, Mottled, Pallor, Rubor, Erythema. Periwound temperature was noted as No Abnormality. Assessment Active Problems ICD-10 Chronic venous hypertension (idiopathic) with ulcer and inflammation of left lower extremity Non-pressure chronic ulcer of other part of left lower leg with other specified severity Type 2 diabetes mellitus with diabetic peripheral angiopathy without gangrene Procedures Wound #1 Pre-procedure diagnosis of Wound #1 is a Venous Leg Ulcer located on the Left,Lateral Lower Leg .Severity of Tissue Pre Debridement is: Fat layer exposed. There was a Excisional Skin/Subcutaneous Tissue Debridement with a total area of 55.11 sq cm performed by Maxwell Caul., MD. With the following instrument(s): Curette to remove Non-Viable tissue/material. Material removed includes Subcutaneous Tissue and Slough and after achieving pain control using Lidocaine 5% topical ointment. No specimens were taken. A time out was conducted at 08:42, prior to the start of the procedure. A Minimum amount of bleeding was controlled with Pressure. The procedure was tolerated well. Post Debridement Measurements: 11.7cm length x 6cm width x 0.3cm depth; 16.54cm^3 volume. Character of Wound/Ulcer Post Debridement is improved. Severity of Tissue Post Debridement is: Fat layer exposed. Post procedure Diagnosis Wound #1: Same as Pre-Procedure Pre-procedure diagnosis of Wound #1 is a Venous Leg Ulcer located on the Left,Lateral Lower Leg . There was a Three Layer Compression Therapy Procedure by Redmond Pulling, RN. Post procedure Diagnosis Wound #1: Same as Pre-Procedure Plan Follow-up  Appointments: Return Appointment in 1 week. - Dr. Leanord Hawking Anesthetic: (In clinic) Topical Lidocaine 5% applied to wound bed Bathing/ Shower/ Hygiene: May shower with protection but do not get wound dressing(s) wet. Protect dressing(s) with water repellant cover (for example, large plastic bag) or a cast cover and may then take shower. - Do not get the left leg wet. Keep the left leg dry until the next appointment. Edema Control - Orders / Instructions: Elevate legs to the level of the heart or above for 30 minutes daily and/or when sitting for 3-4 times a day throughout the day. - As tolerated. Keep legs up when sitting/lying CHESLEY, FERRARE ([id number]) [id number] Page 6 of 7 Avoid standing for long periods of time. Home Health: New wound care orders this week; continue Home Health for wound care. May utilize formulary equivalent dressing for wound treatment orders unless otherwise specified. - TCA to  periwound. Santyl to wound bed and Hydrafera Blue Ready over Santyl and then use cover wound -gauze,ABD pad and URGO K2 or 3 layer compression wraps Other Home Health Orders/Instructions: - [facility] The following medication(s) was prescribed: lidocaine topical 5 % ointment ointment topical once daily was prescribed at facility WOUND #1: - Lower Leg Wound Laterality: Left, Lateral Cleanser: Soap and Water 2 x Per Week/30 Days Discharge Instructions: May shower and wash wound with dial antibacterial soap and water prior to dressing change. Topical: Triamcinolone 2 x Per Week/30 Days Discharge Instructions: Apply Triamcinolone as directed Prim Dressing: Hydrofera Blue Ready Transfer Foam, 4x5 (in/in) 2 x Per Week/30 Days ary Discharge Instructions: Apply to wound bed as instructed Prim Dressing: Santyl Ointment 2 x Per Week/30 Days ary Discharge Instructions: Apply nickel thick amount to wound bed as instructed Secondary Dressing: ABD Pad, 8x10 2 x  Per Week/30 Days Discharge Instructions: Apply over primary dressing as directed. Secondary Dressing: Woven Gauze Sponge, Non-Sterile 4x4 in 2 x Per Week/30 Days Discharge Instructions: Apply over primary dressing as directed. Com pression Wrap: Urgo K2 Lite, (equivalent to a 3 layer) two layer compression system, regular 2 x Per Week/30 Days Discharge Instructions: Apply Urgo K2 Lite as directed (alternative to 3 layer compression). Com pression Wrap: ThreePress (3 layer compression wrap) 2 x Per Week/30 Days Discharge Instructions: or Apply three layer compression as directed. 1. Still the same dressing Santyl Hydrofera Blue 2. Liberal TH TCA 3. Still under Urgo K2 light compression 4. Consider PCR culture next week 5. Surprisingly he tolerated the dressing well. Electronic Signature(s) Signed: 08/04/2023 4:06:32 PM By: Baltazar Najjar MD Entered By: Baltazar Najjar on 08/03/2023 05:50:37 -------------------------------------------------------------------------------- SuperBill Details Patient Name: Date of Service: [name] W. 08/03/2023 Medical Record Number: [id number] Patient Account Number: [id number] Date of Birth/Sex: Treating RN: 08/06/1941 (82 y.o. M) Primary Care Provider: Linus Galas Other Clinician: Referring Provider: Treating Provider/Extender: Tina Griffiths, Glade Stanford in Treatment: 0 Diagnosis Coding ICD-10 Codes Code Description [id number] Chronic venous hypertension (idiopathic) with ulcer and inflammation of left lower extremity L97.828 Non-pressure chronic ulcer of other part of left lower leg with other specified severity E11.51 Type 2 diabetes mellitus with diabetic peripheral angiopathy without gangrene Facility Procedures : CPT4 Code: [id number] Description: 11042 - DEB SUBQ TISSUE 20 SQ CM/< ICD-10 Diagnosis Description L97.828 Non-pressure chronic ulcer of other part of left lower leg with other specified I87.332 Chronic venous hypertension  (idiopathic) with ulcer and inflammation of left lo Modifier: severity wer extremity Quantity: 1 Physician Procedures : CPT4 Code Description Modifier [id number] 11042 - WC PHYS SUBQ TISS 20 SQ CM ICD-10 Diagnosis Description L97.828 Non-pressure chronic ulcer of other part of left lower leg with other specified severity I87.332 Chronic venous hypertension (idiopathic)  with ulcer and inflammation of left lower extremity Quantity: 1 : [id number] 11045 - WC PHYS SUBQ TISS EA ADDL 20 CM ICD-10 Diagnosis Description L97.828 Non-pressure chronic ulcer of other part of left lower leg with other specified severity I87.332 Chronic venous hypertension (idiopathic) with ulcer and inflammation  of left lower extremity Quantity: 2 Electronic Signature(s) Signed: 08/04/2023 4:06:32 PM By: Baltazar Najjar MD Entered By: Baltazar Najjar on 08/03/2023 05:51:13

## 2023-08-04 NOTE — Progress Notes (Signed)
SEIF, BAUL ([id number]) [id number] Page 1 of 7 Visit Report for 08/03/2023 Arrival Information Details Patient Name: Date of Service: [name], [name] 08/03/2023 8:15 A M Medical Record Number: [id number] Patient Account Number: [id number] Date of Birth/Sex: Treating RN: [birth year] (82 y.o. [name] Primary Care [name]: [name] Other Clinician: Referring [name]: Treating Richad [name]/Extender: [name] in Treatment: 0 Visit Information History Since Last Visit Added or deleted any medications: No Patient Arrived: Ambulatory Any new allergies or adverse reactions: No Arrival Time: 08:26 Had a fall or experienced change in No Accompanied By: wife activities of daily living that may affect Transfer Assistance: None risk of falls: Patient Identification Verified: Yes Signs or symptoms of abuse/neglect since last visito No Secondary Verification Process Completed: Yes Hospitalized since last visit: No Patient Requires Transmission-Based Precautions: No Implantable device outside of the clinic excluding No Patient Has Alerts: Yes cellular tissue based products placed in the center Patient Alerts: ABI R 0.97 since last visit: ABI L 0.61 Has Dressing in Place as Prescribed: Yes TBI 0.46 Has Compression in Place as Prescribed: Yes Pain Present Now: Yes Electronic Signature(s) Signed: 08/03/2023 3:50:36 PM By: Frank Pulling RN, BSN Entered By: [name] on 08/03/2023 05:26:52 -------------------------------------------------------------------------------- Compression Therapy Details Patient Name: Date of Service: Frank Musa W. 08/03/2023 8:15 A M Medical Record Number: [id number] Patient Account Number: [id number] Date of Birth/Sex: Treating RN: [birth year] (82 y.o. [name] Primary Care Holiday Mcmenamin: [name] Other Clinician: Referring [name]: Treating [name]/Extender: [name] in Treatment: 0 Compression Therapy Performed for Wound Assessment: Wound #1 Left,Lateral Lower Leg Performed By: Clinician Frank Pulling, RN Compression Type: Three Layer Post Procedure Diagnosis Same as Pre-procedure Electronic Signature(s) Signed: 08/03/2023 3:50:36 PM By: Frank Pulling RN, BSN Entered By: [name] on 08/03/2023 05:44:02 [name] ([id number]) [id number] Page 2 of 7 -------------------------------------------------------------------------------- Encounter Discharge Information Details Patient Name: Date of Service: [name], [name] 08/03/2023 8:15 A M Medical Record Number: [id number] Patient Account Number: [id number] Date of Birth/Sex: Treating RN: [birth year] (82 y.o. [name] Primary Care Nithin Demeo: [name] Other Clinician: Referring [name]: Treating [name]/Extender: [name] in Treatment: 0 Encounter Discharge Information Items Post Procedure Vitals Discharge Condition: Stable Temperature (F): 98.1 Ambulatory Status: Ambulatory Pulse (bpm): 47 Discharge Destination: Home Respiratory Rate (breaths/min): 18 Transportation: Private Auto Blood Pressure (mmHg): 192/97 Accompanied By: wife Schedule Follow-up Appointment: Yes Clinical Summary of Care: Patient Declined Electronic Signature(s) Signed: 08/03/2023 3:50:36 PM By: Frank Pulling RN, BSN Entered By: [name] on 08/03/2023 05:46:51 -------------------------------------------------------------------------------- Lower Extremity Assessment Details Patient Name: Date of Service: Frank Musa W. 08/03/2023 8:15 A M Medical Record Number: [id number] Patient Account Number: [id number] Date of Birth/Sex: Treating RN: 1941-09-19 (82 y.o. [name] Primary Care Josafat Enrico: [name] Other Clinician: Referring Gwendola Hornaday: Treating Kahlen Morais/Extender: [name]  in Treatment: 0 Edema Assessment Assessed: [name]: No] [Right: No] Edema: [Left: Ye] [Right: s] Calf Left: Right: Point of Measurement: 30 cm From Medial Instep 33.5 cm Ankle Left: Right: Point of Measurement: 12 cm From Medial Instep 22 cm Vascular Assessment Extremity colors, hair growth, and conditions: Extremity Color: [Left:Red] Hair Growth on Extremity: [Left:No] Temperature of Extremity: [Left:Warm] Capillary Refill: [Left:< 3 seconds] Dependent Rubor: [Left:No Yes] Electronic Signature(s) Signed: 08/03/2023 3:50:36 PM By: Frank Pulling RN, BSN Entered By: [name] on 08/03/2023 05:30:19 [name] ([id number]) [id number] Page 3 of 7 -------------------------------------------------------------------------------- Multi Wound Chart Details Patient  Name: Date of Service: [name], [name] 08/03/2023 8:15 A M Medical Record Number: [id number] Patient Account Number: [id number] Date of Birth/Sex: Treating RN: 13-Nov-1940 (82 y.o. M) Primary Care [name]: [name] Other Clinician: Referring Khalise Billard: Treating [name]/Extender: [name] in Treatment: 0 Vital Signs Height(in): 64 Pulse(bpm): 47 Weight(lbs): 189.4 Blood Pressure(mmHg): 192/97 Body Mass Index(BMI): 32.5 Temperature(F): 98.1 Respiratory Rate(breaths/min): 18 [1:Photos:] [N/A:N/A] Left, Lateral Lower Leg N/A N/A Wound Location: Bump N/A N/A Wounding Event: Venous Leg Ulcer N/A N/A Primary Etiology: Cataracts, Arrhythmia, Congestive N/A N/A Comorbid History: Heart Failure, Hypertension, Gout 06/13/2023 N/A N/A Date Acquired: 0 N/A N/A Weeks of Treatment: Open N/A N/A Wound Status: No N/A N/A Wound Recurrence: 11.7x6x0.3 N/A N/A Measurements L x W x D (cm) 55.135 N/A N/A A (cm) : rea 16.54 N/A N/A Volume (cm) : 35.70% N/A N/A % Reduction in A rea: 51.80% N/A N/A % Reduction in Volume: Full Thickness Without Exposed N/A  N/A Classification: Support Structures Large N/A N/A Exudate A mount: Purulent N/A N/A Exudate Type: yellow, brown, green N/A N/A Exudate Color: Distinct, outline attached N/A N/A Wound Margin: Medium (34-66%) N/A N/A Granulation A mount: Red N/A N/A Granulation Quality: Medium (34-66%) N/A N/A Necrotic A mount: Fat Layer (Subcutaneous Tissue): Yes N/A N/A Exposed Structures: Fascia: No Tendon: No Muscle: No Joint: No Bone: No None N/A N/A Epithelialization: Debridement - Excisional N/A N/A Debridement: Pre-procedure Verification/Time Out 08:42 N/A N/A Taken: Lidocaine 5% topical ointment N/A N/A Pain Control: Subcutaneous, Slough N/A N/A Tissue Debrided: Skin/Subcutaneous Tissue N/A N/A Level: 55.11 N/A N/A Debridement A (sq cm): rea Curette N/A N/A Instrument: Minimum N/A N/A Bleeding: Pressure N/A N/A Hemostasis A chieved: Procedure was tolerated well N/A N/A Debridement Treatment Response: 11.7x6x0.3 N/A N/A Post Debridement Measurements L x W x D (cm) 16.54 N/A N/A Post Debridement Volume: (cm) Excoriation: No N/A N/A Periwound Skin Texture: Induration: No Callus: No [name], [name] ([id number]) [id number] Page 4 of 7 Crepitus: No Rash: No Scarring: No Maceration: Yes N/A N/A Periwound Skin Moisture: Dry/Scaly: No Hemosiderin Staining: Yes N/A N/A Periwound Skin Color: Atrophie Blanche: No Cyanosis: No Ecchymosis: No Erythema: No Mottled: No Pallor: No Rubor: No No Abnormality N/A N/A Temperature: Compression Therapy N/A N/A Procedures Performed: Debridement Treatment Notes Electronic Signature(s) Signed: 08/04/2023 4:06:32 PM By: Baltazar Najjar MD Entered By: Baltazar Najjar on 08/03/2023 05:46:21 -------------------------------------------------------------------------------- Multi-Disciplinary Care Plan Details Patient Name: Date of Service: Frank Musa W. 08/03/2023 8:15 A M Medical Record  Number: [id number] Patient Account Number: [id number] Date of Birth/Sex: Treating RN: [birth year] (82 y.o. [name] Primary Care Geral Tuch: [name] Other Clinician: Referring Tevis Dunavan: Treating Cantrell Larouche/Extender: [name] in Treatment: 0 Active Inactive Wound/Skin Impairment Nursing Diagnoses: Impaired tissue integrity Goals: Patient/caregiver will verbalize understanding of skin care regimen Date Initiated: 07/28/2023 Target Resolution Date: 04/12/2024 Goal Status: Active Interventions: Assess ulceration(s) every visit Treatment Activities: Skin care regimen initiated : 07/28/2023 Notes: Electronic Signature(s) Signed: 08/03/2023 3:50:36 PM By: Frank Pulling RN, BSN Entered By: [name] on 08/03/2023 05:34:26 -------------------------------------------------------------------------------- Pain Assessment Details Patient Name: Date of Service: Frank Musa W. 08/03/2023 8:15 A [employer], Ellene Route ([id number]) [id number] Page 5 of 7 Medical Record Number: [id number] Patient Account Number: [id number] Date of Birth/Sex: Treating RN: 1941-07-25 (82 y.o. [name] Primary Care Larrisa Cravey: [name] Other Clinician: Referring Nathian Stencil: Treating Jaanvi Fizer/Extender: [name] in Treatment: 0 Active Problems Location of Pain Severity and Description of Pain Patient  Has Paino Yes Site Locations Rate the pain. Current Pain Level: 4 Least Pain Level: 0 Character of Pain Describe the Pain: Aching Pain Management and Medication Current Pain Management: Electronic Signature(s) Signed: 08/03/2023 3:50:36 PM By: Frank Pulling RN, BSN Entered By: [name] on 08/03/2023 05:27:35 -------------------------------------------------------------------------------- Patient/Caregiver Education Details Patient Name: Date of Service: [name] 11/13/2024andnbsp8:15 A M Medical  Record Number: [id number] Patient Account Number: [id number] Date of Birth/Gender: Treating RN: 03-12-1941 (82 y.o. [name] Primary Care Physician: [name] Other Clinician: Referring Physician: Treating Physician/Extender: [name] in Treatment: 0 Education Assessment Education Provided To: Patient Education Topics Provided Wound/Skin Impairment: Methods: Explain/Verbal Responses: State content correctly Electronic Signature(s) Signed: 08/03/2023 3:50:36 PM By: Frank Pulling RN, BSN Entered By: [name] on 08/03/2023 05:34:55 [name] ([id number]) [id number] Page 6 of 7 -------------------------------------------------------------------------------- Wound Assessment Details Patient Name: Date of Service: [name], [name] 08/03/2023 8:15 A M Medical Record Number: [id number] Patient Account Number: [id number] Date of Birth/Sex: Treating RN: 03-15-1941 (82 y.o. [name] Primary Care Abreanna Drawdy: [name] Other Clinician: Referring Safiatou Islam: Treating Mazy Culton/Extender: [name] in Treatment: 0 Wound Status Wound Number: 1 Primary Venous Leg Ulcer Etiology: Wound Location: Left, Lateral Lower Leg Wound Status: Open Wounding Event: Bump Comorbid Cataracts, Arrhythmia, Congestive Heart Failure, Date Acquired: 06/13/2023 History: Hypertension, Gout Weeks Of Treatment: 0 Clustered Wound: No Photos Wound Measurements Length: (cm) 11.7 Width: (cm) 6 Depth: (cm) 0.3 Area: (cm) 55.135 Volume: (cm) 16.54 % Reduction in Area: 35.7% % Reduction in Volume: 51.8% Epithelialization: None Tunneling: No Undermining: No Wound Description Classification: Full Thickness Without Exposed Suppor Wound Margin: Distinct, outline attached Exudate Amount: Large Exudate Type: Purulent Exudate Color: yellow, brown, green t Structures Foul Odor After Cleansing: No Slough/Fibrino  Yes Wound Bed Granulation Amount: Medium (34-66%) Exposed Structure Granulation Quality: Red Fascia Exposed: No Necrotic Amount: Medium (34-66%) Fat Layer (Subcutaneous Tissue) Exposed: Yes Necrotic Quality: Adherent Slough Tendon Exposed: No Muscle Exposed: No Joint Exposed: No Bone Exposed: No Periwound Skin Texture Texture Color No Abnormalities Noted: No No Abnormalities Noted: No Callus: No Atrophie Blanche: No Crepitus: No Cyanosis: No Excoriation: No Ecchymosis: No Induration: No Erythema: No Rash: No Hemosiderin Staining: Yes Scarring: No Mottled: No Pallor: No Moisture Rubor: No No Abnormalities Noted: No Dry / Scaly: No Temperature / Pain [name], [name] ([id number]) [id number] Page 7 of 7 Maceration: Yes Temperature: No Abnormality Treatment Notes Wound #1 (Lower Leg) Wound Laterality: Left, Lateral Cleanser Soap and Water Discharge Instruction: May shower and wash wound with dial antibacterial soap and water prior to dressing change. Peri-Wound Care Topical Triamcinolone Discharge Instruction: Apply Triamcinolone as directed Primary Dressing Hydrofera Blue Ready Transfer Foam, 4x5 (in/in) Discharge Instruction: Apply to wound bed as instructed Santyl Ointment Discharge Instruction: Apply nickel thick amount to wound bed as instructed Secondary Dressing ABD Pad, 8x10 Discharge Instruction: Apply over primary dressing as directed. Woven Gauze Sponge, Non-Sterile 4x4 in Discharge Instruction: Apply over primary dressing as directed. Secured With Compression Wrap Urgo K2 Lite, (equivalent to a 3 layer) two layer compression system, regular Discharge Instruction: Apply Urgo K2 Lite as directed (alternative to 3 layer compression). ThreePress (3 layer compression wrap) Discharge Instruction: or Apply three layer compression as directed. Compression Stockings Add-Ons Electronic Signature(s) Signed: 08/03/2023 3:50:36 PM  By: Frank Pulling RN, BSN Entered By: [name] on 08/03/2023 05:32:02 -------------------------------------------------------------------------------- Vitals Details Patient Name: Date of Service: [name], Frank Stage W. 08/03/2023 8:15 A M  Medical Record Number: [id number] Patient Account Number: [id number] Date of Birth/Sex: Treating RN: 1940/10/13 (82 y.o. [name] Primary Care Avish Torry: [name] Other Clinician: Referring Yolander Goodie: Treating Branae Crail/Extender: [name] in Treatment: 0 Vital Signs Time Taken: 08:29 Temperature (F): 98.1 Height (in): 64 Pulse (bpm): 47 Weight (lbs): 189.4 Respiratory Rate (breaths/min): 18 Body Mass Index (BMI): 32.5 Blood Pressure (mmHg): 192/97 Reference Range: 80 - 120 mg / dl Electronic Signature(s) Signed: 08/03/2023 3:50:36 PM By: Frank Pulling RN, BSN Entered By: [name] on 08/03/2023 05:30:03

## 2023-08-08 ENCOUNTER — Other Ambulatory Visit: Payer: Self-pay | Admitting: Physician Assistant

## 2023-08-09 ENCOUNTER — Encounter (HOSPITAL_BASED_OUTPATIENT_CLINIC_OR_DEPARTMENT_OTHER): Payer: Medicare Other | Admitting: Internal Medicine

## 2023-08-09 DIAGNOSIS — E11621 Type 2 diabetes mellitus with foot ulcer: Secondary | ICD-10-CM | POA: Diagnosis not present

## 2023-08-09 NOTE — Progress Notes (Signed)
[name], [name] ([id number]) [id number] Page 1 of 7 Visit Report for 08/09/2023 Debridement Details Patient Name: Date of Service: [name], [name] 08/09/2023 10:00 A M Medical Record Number: [id number] Patient Account Number: [id number] Date of Birth/Sex: Treating RN: [birth year] (82 y.o. M) Primary Care Provider: Linus Galas Other Clinician: Referring Provider: Treating Provider/Extender: [name] in Treatment: 1 Debridement Performed for Assessment: Wound #1 Left,Lateral Lower Leg Performed By: Physician Maxwell Caul., MD The following information was scribed by: Shawn Stall The information was scribed for: Baltazar Najjar Debridement Type: Debridement Severity of Tissue Pre Debridement: Fat layer exposed Level of Consciousness (Pre-procedure): Awake and Alert Pre-procedure Verification/Time Out Yes - 10:10 Taken: Start Time: 10:11 Pain Control: Lidocaine 4% T opical Solution Percent of Wound Bed Debrided: 80% T Area Debrided (cm): otal 42.96 Tissue and other material debrided: Viable, Non-Viable, Slough, Subcutaneous, Slough Level: Skin/Subcutaneous Tissue Debridement Description: Excisional Instrument: Curette Bleeding: Moderate Hemostasis Achieved: Pressure End Time: 10:15 Procedural Pain: 0 Post Procedural Pain: 0 Response to Treatment: Procedure was tolerated well Level of Consciousness (Post- Awake and Alert procedure): Post Debridement Measurements of Total Wound Length: (cm) 12 Width: (cm) 5.7 Depth: (cm) 0.3 Volume: (cm) 16.116 Character of Wound/Ulcer Post Debridement: Improved Severity of Tissue Post Debridement: Fat layer exposed Post Procedure Diagnosis Same as Pre-procedure Electronic Signature(s) Signed: 08/09/2023 4:19:19 PM By: Baltazar Najjar MD Entered By: Baltazar Najjar on 08/09/2023 07:22:41 -------------------------------------------------------------------------------- HPI  Details Patient Name: Date of Service: Frank Musa [name] 08/09/2023 10:00 A M Medical Record Number: [id number] Patient Account Number: [id number] Date of Birth/Sex: Treating RN: 1941/01/20 (82 y.o. M) Primary Care Provider: Linus Galas Other Clinician: Referring Provider: Treating Provider/Extender: [name] in Treatment: 1 Pukalani, Milan [name] ([id number]) [id number] Page 2 of 7 History of Present Illness HPI Description: ADMISSION 07/28/2023. This is an 82 year old subsomewhat frail man who arrived accompanied by his wife and daughter. He has a large chronic ulcer on the left lateral lower leg. He was hospitalized from 10/30 through 07/23/2023 with a nonhealing wound in the left leg felt to have coexistent cellulitis. He was seen in the hospital by Dr. Lajoyce Corners. Discharged on Duricef and Flagyl. He is supposed to follow-up with Dr. Lajoyce Corners next week. He also saw a vein and vascular and did not feel he needed any intervention. I note that he had previously been seen earlier this year by Dr. [facility] at [facility]. Felt to have chronic venous hypertension and secondary lymphedema. At 1 point he was discovered to have maggots in this wound. Using Xeroform and T elfa. He has home health going out to see him and there is supposed to come tomorrow. Past medical history includes paroxysmal atrial fibrillation, hypertension, type 2 diabetes, chronic kidney disease, known PAD, heart failure with preserved ejection fraction He had arterial studies done on 07/21/2023. On the right his ABI was 0.97 TBI of 0.48 but he had triphasic and wave biphasic waveforms. On the left his TBI was 0.61. I do not think anything further was done. 11/13; large chronic venous insufficiency wound on the left lateral lower leg with 2 small superior satellite lesions. We used Santyl and Hydrofera Blue under and Urgo K2 light compression. He has some degree of PAD which is  the reason for the Urgo K2 lite's. He seems to have tolerated this surprisingly well. He has completed his antibiotics 11/19; large chronic venous insufficiency wound on the left lateral lower leg with 2 small satellite lesions superiorly.  We are using Santyl and Hydrofera Blue under an Urgo K2 light compression. There is seems to been some confusion about the need for home health I think it would be really in the patient's best interest to have home health change this 1 time and we will do it 1 time in clinic per week Electronic Signature(s) Signed: 08/09/2023 4:19:19 PM By: Baltazar Najjar MD Entered By: Baltazar Najjar on 08/09/2023 07:23:45 -------------------------------------------------------------------------------- Physical Exam Details Patient Name: Date of Service: Frank Musa [name] 08/09/2023 10:00 A M Medical Record Number: [id number] Patient Account Number: [id number] Date of Birth/Sex: Treating RN: Oct 08, 1940 (82 y.o. M) Primary Care Provider: Linus Galas Other Clinician: Referring Provider: Treating Provider/Extender: Tina Griffiths, Glade Stanford in Treatment: 1 Constitutional Patient is hypertensive.. Pulse regular and within target range for patient.Marland Kitchen Respirations regular, non-labored and within target range.. Temperature is normal and within the target range for the patient.Marland Kitchen Appears in no distress. Notes Wound exam; punched-out large wound on the left lateral calf. We have good edema control. Pedal pulses are palpable he is tolerating the wraps well. I used a large open curette to attempt as much surface debridement as possible. After that I used a #5 curette to do a deep tissue specimen for PCR culture. Fortunately there is no evidence of surrounding infection Electronic Signature(s) Signed: 08/09/2023 4:19:19 PM By: Baltazar Najjar MD Entered By: Baltazar Najjar on 08/09/2023  07:25:37 -------------------------------------------------------------------------------- Physician Orders Details Patient Name: Date of Service: Frank Musa [name] 08/09/2023 10:00 A M Medical Record Number: [id number] Patient Account Number: [id number] Date of Birth/Sex: Treating RN: March 16, 1941 (82 y.o. [name] Primary Care Provider: Linus Galas Other Clinician: Referring Provider: Treating Provider/Extender: [name] in Treatment: 1 [name], [name] ([id number]) [id number] Page 3 of 7 The following information was scribed by: Shawn Stall The information was scribed for: Baltazar Najjar Verbal / Phone Orders: No Diagnosis Coding ICD-10 Coding Code Description I87.332 Chronic venous hypertension (idiopathic) with ulcer and inflammation of left lower extremity L97.828 Non-pressure chronic ulcer of other part of left lower leg with other specified severity E11.51 Type 2 diabetes mellitus with diabetic peripheral angiopathy without gangrene Follow-up Appointments ppointment in 2 weeks. - Dr. Leanord Hawking (already scheduled) 08/23/2023 Return A Nurse Visit: - 08/16/2023 215pm nurse visit. Other: - Vikor PCR culture taken- once it returns will call you with results. Plan if positive topical compounding antibiotics. Anesthetic (In clinic) Topical Lidocaine 5% applied to wound bed Bathing/ Shower/ Hygiene May shower with protection but do not get wound dressing(s) wet. Protect dressing(s) with water repellant cover (for example, large plastic bag) or a cast cover and may then take shower. - Do not get the left leg wet. Keep the left leg dry until the next appointment. Edema Control - Orders / Instructions Elevate legs to the level of the heart or above for 30 minutes daily and/or when sitting for 3-4 times a day throughout the day. - As tolerated. Keep legs up when sitting/lying Avoid standing for long periods of time. Home  Health New wound care orders this week; continue Home Health for wound care. May utilize formulary equivalent dressing for wound treatment orders unless otherwise specified. - TCA to periwound. Santyl to wound bed and Hydrafera Blue Ready over Santyl and then use cover wound - gauze,ABD pad and URGO K2 or 3 layer compression wraps. once culture returns will change primary dressing. Other Home Health Orders/Instructions: - [facility] fax:[phone] Wound Treatment Wound #1 - Lower Leg Wound  Laterality: Left, Lateral Cleanser: Soap and Water 2 x Per Week/30 Days Discharge Instructions: May shower and wash wound with dial antibacterial soap and water prior to dressing change. Topical: Triamcinolone 2 x Per Week/30 Days Discharge Instructions: Apply Triamcinolone as directed Prim Dressing: Hydrofera Blue Ready Transfer Foam, 4x5 (in/in) 2 x Per Week/30 Days ary Discharge Instructions: Apply to wound bed as instructed Prim Dressing: Santyl Ointment 2 x Per Week/30 Days ary Discharge Instructions: Apply nickel thick amount to wound bed as instructed Secondary Dressing: ABD Pad, 8x10 2 x Per Week/30 Days Discharge Instructions: Apply over primary dressing as directed. Secondary Dressing: Woven Gauze Sponge, Non-Sterile 4x4 in 2 x Per Week/30 Days Discharge Instructions: Apply over primary dressing as directed. Compression Wrap: Urgo K2 Lite, (equivalent to a 3 layer) two layer compression system, regular 2 x Per Week/30 Days Discharge Instructions: Apply Urgo K2 Lite as directed (alternative to 3 layer compression). Laboratory erobe culture (MICRO) - left leg PCR culture Vikor. Bacteria identified in Unspecified specimen by A LOINC Code: 634-6 Convenience Name: Aerobic culture-specimen not specified Electronic Signature(s) Signed: 08/09/2023 4:19:19 PM By: Baltazar Najjar MD Signed: 08/09/2023 4:47:01 PM By: Shawn Stall RN, BSN Entered By: Shawn Stall on 08/09/2023  07:25:16 [name] ([id number]) [id number] Page 4 of 7 -------------------------------------------------------------------------------- Problem List Details Patient Name: Date of Service: [name], [name] 08/09/2023 10:00 A M Medical Record Number: [id number] Patient Account Number: [id number] Date of Birth/Sex: Treating RN: 03/16/1941 (82 y.o. M) Primary Care Provider: Linus Galas Other Clinician: Referring Provider: Treating Provider/Extender: Tina Griffiths, Glade Stanford in Treatment: 1 Active Problems ICD-10 Encounter Code Description Active Date MDM Diagnosis I87.332 Chronic venous hypertension (idiopathic) with ulcer and inflammation of left 07/28/2023 No Yes lower extremity L97.828 Non-pressure chronic ulcer of other part of left lower leg with other specified 07/28/2023 No Yes severity E11.51 Type 2 diabetes mellitus with diabetic peripheral angiopathy without gangrene 07/28/2023 No Yes Inactive Problems ICD-10 Code Description Active Date Inactive Date L03.116 Cellulitis of left lower limb 07/28/2023 07/28/2023 Resolved Problems Electronic Signature(s) Signed: 08/09/2023 4:19:19 PM By: Baltazar Najjar MD Entered By: Baltazar Najjar on 08/09/2023 07:22:23 -------------------------------------------------------------------------------- Progress Note Details Patient Name: Date of Service: Frank Musa [name] 08/09/2023 10:00 A M Medical Record Number: [id number] Patient Account Number: [id number] Date of Birth/Sex: Treating RN: 28-Feb-1941 (82 y.o. M) Primary Care Provider: Linus Galas Other Clinician: Referring Provider: Treating Provider/Extender: Tina Griffiths, Glade Stanford in Treatment: 1 Subjective History of Present Illness (HPI) ADMISSION 07/28/2023. This is an 82 year old subsomewhat frail man who arrived accompanied by his wife and daughter. He has a large chronic ulcer on the left lateral lower leg. He was  hospitalized from 10/30 through 07/23/2023 with a nonhealing wound in the left leg felt to have coexistent cellulitis. He was seen in the hospital by Dr. Lajoyce Corners. Discharged on Duricef and Flagyl. He is supposed to follow-up with Dr. Lajoyce Corners next week. He also saw a vein and vascular and did not feel he needed any [name], [name] ([id number]) [id number] Page 5 of 7 intervention. I note that he had previously been seen earlier this year by Dr. Shepard [facility] at [facility]. Felt to have chronic venous hypertension and secondary lymphedema. At 1 point he was discovered to have maggots in this wound. Using Xeroform and T elfa. He has home health going out to see him and there is supposed to come tomorrow. Past medical history includes paroxysmal atrial fibrillation, hypertension, type 2 diabetes, chronic kidney disease, known PAD,  heart failure with preserved ejection fraction He had arterial studies done on 07/21/2023. On the right his ABI was 0.97 TBI of 0.48 but he had triphasic and wave biphasic waveforms. On the left his TBI was 0.61. I do not think anything further was done. 11/13; large chronic venous insufficiency wound on the left lateral lower leg with 2 small superior satellite lesions. We used Santyl and Hydrofera Blue under and Urgo K2 light compression. He has some degree of PAD which is the reason for the Urgo K2 lite's. He seems to have tolerated this surprisingly well. He has completed his antibiotics 11/19; large chronic venous insufficiency wound on the left lateral lower leg with 2 small satellite lesions superiorly. We are using Santyl and Hydrofera Blue under an Urgo K2 light compression. There is seems to been some confusion about the need for home health I think it would be really in the patient's best interest to have home health change this 1 time and we will do it 1 time in clinic per week Objective Constitutional Patient is hypertensive.. Pulse  regular and within target range for patient.Marland Kitchen Respirations regular, non-labored and within target range.. Temperature is normal and within the target range for the patient.Marland Kitchen Appears in no distress. Vitals Time Taken: 9:57 AM, Height: 64 in, Weight: 189.4 lbs, BMI: 32.5, Temperature: 97.9 F, Pulse: 49 bpm, Respiratory Rate: 17 breaths/min, Blood Pressure: 182/82 mmHg. General Notes: Wound exam; punched-out large wound on the left lateral calf. We have good edema control. Pedal pulses are palpable he is tolerating the wraps well. I used a large open curette to attempt as much surface debridement as possible. After that I used a #5 curette to do a deep tissue specimen for PCR culture. Fortunately there is no evidence of surrounding infection Integumentary (Hair, Skin) Wound #1 status is Open. Original cause of wound was Bump. The date acquired was: 06/13/2023. The wound has been in treatment 1 weeks. The wound is located on the Left,Lateral Lower Leg. The wound measures 12cm length x 5.7cm width x 0.3cm depth; 53.721cm^2 area and 16.116cm^3 volume. There is Fat Layer (Subcutaneous Tissue) exposed. There is a large amount of purulent drainage noted. The wound margin is distinct with the outline attached to the wound base. There is medium (34-66%) red granulation within the wound bed. There is a medium (34-66%) amount of necrotic tissue within the wound bed including Adherent Slough. The periwound skin appearance exhibited: Maceration, Hemosiderin Staining. The periwound skin appearance did not exhibit: Callus, Crepitus, Excoriation, Induration, Rash, Scarring, Dry/Scaly, Atrophie Blanche, Cyanosis, Ecchymosis, Mottled, Pallor, Rubor, Erythema. Periwound temperature was noted as No Abnormality. Assessment Active Problems ICD-10 Chronic venous hypertension (idiopathic) with ulcer and inflammation of left lower extremity Non-pressure chronic ulcer of other part of left lower leg with other specified  severity Type 2 diabetes mellitus with diabetic peripheral angiopathy without gangrene Procedures Wound #1 Pre-procedure diagnosis of Wound #1 is a Venous Leg Ulcer located on the Left,Lateral Lower Leg .Severity of Tissue Pre Debridement is: Fat layer exposed. There was a Excisional Skin/Subcutaneous Tissue Debridement with a total area of 42.96 sq cm performed by Maxwell Caul., MD. With the following instrument(s): Curette to remove Viable and Non-Viable tissue/material. Material removed includes Subcutaneous Tissue and Slough and after achieving pain control using Lidocaine 4% T opical Solution. A time out was conducted at 10:10, prior to the start of the procedure. A Moderate amount of bleeding was controlled with Pressure. The procedure was tolerated well with a pain  level of 0 throughout and a pain level of 0 following the procedure. Post Debridement Measurements: 12cm length x 5.7cm width x 0.3cm depth; 16.116cm^3 volume. Character of Wound/Ulcer Post Debridement is improved. Severity of Tissue Post Debridement is: Fat layer exposed. Post procedure Diagnosis Wound #1: Same as Pre-Procedure Pre-procedure diagnosis of Wound #1 is a Venous Leg Ulcer located on the Left,Lateral Lower Leg . There was a Double Layer Compression Therapy Procedure by [name] Post procedure Diagnosis Wound #1: Same as Pre-Procedure [name], [name] ([id number]) [id number] Page 6 of 7 Plan Follow-up Appointments: Return Appointment in 2 weeks. - Dr. Leanord Hawking (already scheduled) 08/23/2023 Nurse Visit: - 08/16/2023 215pm nurse visit. Other: - Vikor PCR culture taken- once it returns will call you with results. Plan if positive topical compounding antibiotics. Anesthetic: (In clinic) Topical Lidocaine 5% applied to wound bed Bathing/ Shower/ Hygiene: May shower with protection but do not get wound dressing(s) wet. Protect dressing(s) with water repellant cover (for example,  large plastic bag) or a cast cover and may then take shower. - Do not get the left leg wet. Keep the left leg dry until the next appointment. Edema Control - Orders / Instructions: Elevate legs to the level of the heart or above for 30 minutes daily and/or when sitting for 3-4 times a day throughout the day. - As tolerated. Keep legs up when sitting/lying Avoid standing for long periods of time. Home Health: New wound care orders this week; continue Home Health for wound care. May utilize formulary equivalent dressing for wound treatment orders unless otherwise specified. - TCA to periwound. Santyl to wound bed and Hydrafera Blue Ready over Santyl and then use cover wound -gauze,ABD pad and URGO K2 or 3 layer compression wraps. once culture returns will change primary dressing. Other Home Health Orders/Instructions: - [facility] fax:[phone] Laboratory ordered were: Aerobic culture-specimen not specified - left leg PCR culture Vikor. WOUND #1: - Lower Leg Wound Laterality: Left, Lateral Cleanser: Soap and Water 2 x Per Week/30 Days Discharge Instructions: May shower and wash wound with dial antibacterial soap and water prior to dressing change. Topical: Triamcinolone 2 x Per Week/30 Days Discharge Instructions: Apply Triamcinolone as directed Prim Dressing: Hydrofera Blue Ready Transfer Foam, 4x5 (in/in) 2 x Per Week/30 Days ary Discharge Instructions: Apply to wound bed as instructed Prim Dressing: Santyl Ointment 2 x Per Week/30 Days ary Discharge Instructions: Apply nickel thick amount to wound bed as instructed Secondary Dressing: ABD Pad, 8x10 2 x Per Week/30 Days Discharge Instructions: Apply over primary dressing as directed. Secondary Dressing: Woven Gauze Sponge, Non-Sterile 4x4 in 2 x Per Week/30 Days Discharge Instructions: Apply over primary dressing as directed. Com pression Wrap: Urgo K2 Lite, (equivalent to a 3 layer) two layer compression system, regular 2  x Per Week/30 Days Discharge Instructions: Apply Urgo K2 Lite as directed (alternative to 3 layer compression). 1. Aggressive debridement as noted 2. No change the primary dressing which is Santyl with overlying Hydrofera Blue. They have Santyl at home 3. There is seems to have been some form of miscommunication about the need for home health I think it would be wise to continue to change this 1 time per week with home health and then we will change it once 4. PCR culture done will await return but my plan would be to look at a topical antibiotic. 5. Ultimately I think a advanced treatment/skin suboption would be in order here. Possibly an Apligraf But we are well away from this  now with further debridement and possibly topical antibiotics required before we can initiate this Electronic Signature(s) Signed: 08/09/2023 4:19:19 PM By: Baltazar Najjar MD Entered By: Baltazar Najjar on 08/09/2023 13:08:65 -------------------------------------------------------------------------------- SuperBill Details Patient Name: Date of Service: Frank Musa [name] 08/09/2023 Medical Record Number: [id number] Patient Account Number: [id number] Date of Birth/Sex: Treating RN: 10/06/1940 (82 y.o. [name] Primary Care Provider: Linus Galas Other Clinician: Referring Provider: Treating Provider/Extender: Tina Griffiths, Glade Stanford in Treatment: 1 Diagnosis Coding ICD-10 Codes Code Description [phone] Chronic venous hypertension (idiopathic) with ulcer and inflammation of left lower extremity L97.828 Non-pressure chronic ulcer of other part of left lower leg with other specified severity E11.51 Type 2 diabetes mellitus with diabetic peripheral angiopathy without gangrene Facility Procedures : [name], [name] [name] ([id number] IC L I Description: ) [id number] - DEB SUBQ TISSUE 20 SQ CM/< D-10 Diagnosis Description 97.828 Non-pressure chronic ulcer of other part  of left lower leg with other specified severity 87.332 Chronic venous hypertension (idiopathic) with ulcer  and inflammation of left lower extre Modifier: [email or web address] 1 mity Quantity: df Page 7 of 7 : CPT4 Code: [id number] IC L I Description: 045 - DEB SUBQ TISS EA ADDL 20CM D-10 Diagnosis Description 97.828 Non-pressure chronic ulcer of other part of left lower leg with other specified severity 87.332 Chronic venous hypertension (idiopathic) with ulcer and inflammation of left  lower extre Modifier: 2 mity Quantity: Physician Procedures : CPT4 Code Description Modifier [id number] 11042 - WC PHYS SUBQ TISS 20 SQ CM ICD-10 Diagnosis Description L97.828 Non-pressure chronic ulcer of other part of left lower leg with other specified severity I87.332 Chronic venous hypertension (idiopathic)  with ulcer and inflammation of left lower extremity Quantity: 1 : [id number] 11045 - WC PHYS SUBQ TISS EA ADDL 20 CM ICD-10 Diagnosis Description L97.828 Non-pressure chronic ulcer of other part of left lower leg with other specified severity I87.332 Chronic venous hypertension (idiopathic) with ulcer and inflammation  of left lower extremity Quantity: 2 Electronic Signature(s) Signed: 08/09/2023 4:19:19 PM By: Baltazar Najjar MD Entered By: Baltazar Najjar on 08/09/2023 16:60:63

## 2023-08-09 NOTE — Progress Notes (Signed)
[name] ([id number]) [id number] Page 1 of 8 Visit Report for 08/09/2023 Arrival Information Details Patient Name: Date of Service: [name], [name] 08/09/2023 10:00 A M Medical Record Number: [id number] Patient Account Number: [id number] Date of Birth/Sex: Treating RN: 1940/12/01 (82 y.o. [name], [name] Primary Care [name]: [name] Other Clinician: Referring [name]: Treating [name]/Extender: [name] in Treatment: 1 Visit Information History Since Last Visit Added or deleted any medications: No Patient Arrived: Ambulatory Any new allergies or adverse reactions: No Arrival Time: 09:55 Had a fall or experienced change in No Accompanied By: wife activities of daily living that may affect Transfer Assistance: None risk of falls: Patient Identification Verified: Yes Signs or symptoms of abuse/neglect since last visito No Secondary Verification Process Completed: Yes Hospitalized since last visit: No Patient Requires Transmission-Based Precautions: No Implantable device outside of the clinic excluding No Patient Has Alerts: Yes cellular tissue based products placed in the center Patient Alerts: ABI R 0.97 since last visit: ABI L 0.61 Has Dressing in Place as Prescribed: Yes TBI 0.46 Pain Present Now: No Electronic Signature(s) Signed: 08/09/2023 3:54:17 PM By: Frank Mu RN Entered By: [name] on 08/09/2023 09:55:51 -------------------------------------------------------------------------------- Compression Therapy Details Patient Name: Date of Service: [name] 08/09/2023 10:00 A M Medical Record Number: [id number] Patient Account Number: [id number] Date of Birth/Sex: Treating RN: August 29, 1941 (82 y.o. [name] Primary Care [name]: [name] Other Clinician: Referring [name]: Treating [name]/Extender: [name], [name] in Treatment:  1 Compression Therapy Performed for Wound Assessment: Wound #1 Left,Lateral Lower Leg Performed By: Clinician [name], Compression Type: Double Layer Post Procedure Diagnosis Same as Pre-procedure Electronic Signature(s) Signed: 08/09/2023 4:47:01 PM By: Frank Stall RN, BSN Entered By: [name] on 08/09/2023 10:16:21 [name] ([id number]) [id number] Page 2 of 8 -------------------------------------------------------------------------------- Encounter Discharge Information Details Patient Name: Date of Service: [name], [name] 08/09/2023 10:00 A M Medical Record Number: [id number] Patient Account Number: [id number] Date of Birth/Sex: Treating RN: November 19, 1940 (82 y.o. [name] Primary Care [name]: [name] Other Clinician: Referring Kierre Hintz: Treating [name]/Extender: [name] in Treatment: 1 Encounter Discharge Information Items Post Procedure Vitals Discharge Condition: Stable Temperature (F): 97.9 Ambulatory Status: Ambulatory Pulse (bpm): 49 Discharge Destination: Home Respiratory Rate (breaths/min): 17 Transportation: Private Auto Blood Pressure (mmHg): 182/82 Accompanied By: wife Schedule Follow-up Appointment: Yes Clinical Summary of Care: Electronic Signature(s) Signed: 08/09/2023 4:47:01 PM By: Frank Stall RN, BSN Entered By: [name] on 08/09/2023 10:21:01 -------------------------------------------------------------------------------- Lower Extremity Assessment Details Patient Name: Date of Service: [name] 08/09/2023 10:00 A M Medical Record Number: [id number] Patient Account Number: [id number] Date of Birth/Sex: Treating RN: 04/24/1941 (82 y.o. [name], [name] Primary Care Natika Geyer: [name] Other Clinician: Referring [name]: Treating [name]/Extender: [name], [name] in Treatment: 1 Edema Assessment Assessed: [name]: Yes]  [name]: No] Edema: [Left: Ye] [Right: s] Calf Left: Right: Point of Measurement: 30 cm From Medial Instep 33.5 cm Ankle Left: Right: Point of Measurement: 12 cm From Medial Instep 22 cm Vascular Assessment Pulses: Dorsalis Pedis Palpable: [Left:Yes] Posterior Tibial Palpable: [Left:Yes] Extremity colors, hair growth, and conditions: Extremity Color: [Left:Red] Hair Growth on Extremity: [Left:No] Temperature of Extremity: [Left:Warm] Capillary Refill: [Left:< 3 seconds] Dependent Rubor: [Left:No Yes] Electronic Signature(s) Signed: 08/09/2023 3:54:17 PM By: Frank Mu RN Entered By: [name] on 08/09/2023 09:58:10 [name] ([id number]) [id number] Page 3 of 8 -------------------------------------------------------------------------------- Multi Wound Chart Details Patient Name: Date of  Service: [name], DU. 08/09/2023 10:00 A M Medical Record Number: [id number] Patient Account Number: [id number] Date of Birth/Sex: Treating RN: 1941/08/29 (82 y.o. M) Primary Care [name]: [name] Other Clinician: Referring [name]: Treating [name]/Extender: [name], [name] in Treatment: 1 Vital Signs Height(in): 64 Pulse(bpm): 49 Weight(lbs): 189.4 Blood Pressure(mmHg): 182/82 Body Mass Index(BMI): 32.5 Temperature(F): 97.9 Respiratory Rate(breaths/min): 17 [1:Photos:] [N/A:N/A] Left, Lateral Lower Leg N/A N/A Wound Location: Bump N/A N/A Wounding Event: Venous Leg Ulcer N/A N/A Primary Etiology: Cataracts, Arrhythmia, Congestive N/A N/A Comorbid History: Heart Failure, Hypertension, Gout 06/13/2023 N/A N/A Date Acquired: 1 N/A N/A Weeks of Treatment: Open N/A N/A Wound Status: No N/A N/A Wound Recurrence: 12x5.7x0.3 N/A N/A Measurements L x [name] x D (cm) 53.721 N/A N/A A (cm) : rea 16.116 N/A N/A Volume (cm) : 37.40% N/A N/A % Reduction in A rea: 53.00% N/A N/A % Reduction in Volume: Full  Thickness Without Exposed N/A N/A Classification: Support Structures Large N/A N/A Exudate A mount: Purulent N/A N/A Exudate Type: yellow, brown, green N/A N/A Exudate Color: Distinct, outline attached N/A N/A Wound Margin: Medium (34-66%) N/A N/A Granulation A mount: Red N/A N/A Granulation Quality: Medium (34-66%) N/A N/A Necrotic A mount: Fat Layer (Subcutaneous Tissue): Yes N/A N/A Exposed Structures: Fascia: No Tendon: No Muscle: No Joint: No Bone: No None N/A N/A Epithelialization: Debridement - Excisional N/A N/A Debridement: Pre-procedure Verification/Time Out 10:10 N/A N/A Taken: Lidocaine 4% Topical Solution N/A N/A Pain Control: Subcutaneous, Slough N/A N/A Tissue Debrided: Skin/Subcutaneous Tissue N/A N/A Level: 42.96 N/A N/A Debridement A (sq cm): rea Curette N/A N/A Instrument: Moderate N/A N/A Bleeding: Pressure N/A N/A Hemostasis A chieved: 0 N/A N/A Procedural Pain: 0 N/A N/A Post Procedural Pain: Procedure was tolerated well N/A N/A Debridement Treatment Response: 12x5.7x0.3 N/A N/A Post Debridement Measurements L x [name] x D (cm) 16.116 N/A N/A Post Debridement Volume: (cm) [name] ([id number]) [id number] Page 4 of 8 Excoriation: No N/A N/A Periwound Skin Texture: Induration: No Callus: No Crepitus: No Rash: No Scarring: No Maceration: Yes N/A N/A Periwound Skin Moisture: Dry/Scaly: No Hemosiderin Staining: Yes N/A N/A Periwound Skin Color: Atrophie Blanche: No Cyanosis: No Ecchymosis: No Erythema: No Mottled: No Pallor: No Rubor: No No Abnormality N/A N/A Temperature: Compression Therapy N/A N/A Procedures Performed: Debridement Treatment Notes Wound #1 (Lower Leg) Wound Laterality: Left, Lateral Cleanser Soap and Water Discharge Instruction: May shower and wash wound with dial antibacterial soap and water prior to dressing change. Peri-Wound Care Topical Triamcinolone Discharge  Instruction: Apply Triamcinolone as directed Primary Dressing Hydrofera Blue Ready Transfer Foam, 4x5 (in/in) Discharge Instruction: Apply to wound bed as instructed Santyl Ointment Discharge Instruction: Apply nickel thick amount to wound bed as instructed Secondary Dressing ABD Pad, 8x10 Discharge Instruction: Apply over primary dressing as directed. Woven Gauze Sponge, Non-Sterile 4x4 in Discharge Instruction: Apply over primary dressing as directed. Secured With Compression Wrap Urgo K2 Lite, (equivalent to a 3 layer) two layer compression system, regular Discharge Instruction: Apply Urgo K2 Lite as directed (alternative to 3 layer compression). Compression Stockings Add-Ons Electronic Signature(s) Signed: 08/09/2023 4:19:19 PM By: Baltazar Najjar MD Entered By: Baltazar Najjar on 08/09/2023 10:22:30 -------------------------------------------------------------------------------- Multi-Disciplinary Care Plan Details Patient Name: Date of Service: [name] 08/09/2023 10:00 A M Medical Record Number: [id number] Patient Account Number: [id number] Date of Birth/Sex: Treating RN: May 23, 1941 (82 y.o. [name] Primary Care Rosea Dory: [name] Other Clinician: Referring Carlye Panameno: Treating Brighten Orndoff/Extender: [name], [name] in  Treatment: 1 [name], [name] ([id number]) [id number] Page 5 of 8 Active Inactive Wound/Skin Impairment Nursing Diagnoses: Impaired tissue integrity Goals: Patient/caregiver will verbalize understanding of skin care regimen Date Initiated: 07/28/2023 Target Resolution Date: 04/12/2024 Goal Status: Active Interventions: Assess ulceration(s) every visit Treatment Activities: Skin care regimen initiated : 07/28/2023 Notes: Electronic Signature(s) Signed: 08/09/2023 4:47:01 PM By: Frank Stall RN, BSN Entered By: [name] on 08/09/2023  10:19:26 -------------------------------------------------------------------------------- Pain Assessment Details Patient Name: Date of Service: [name] 08/09/2023 10:00 A M Medical Record Number: [id number] Patient Account Number: [id number] Date of Birth/Sex: Treating RN: [birth year] (82 y.o. Lucious Groves Primary Care Cambrey Lupi: [name] Other Clinician: Referring Lucky Alverson: Treating Stephan Draughn/Extender: [name] in Treatment: 1 Active Problems Location of Pain Severity and Description of Pain Patient Has Paino No Site Locations Pain Management and Medication Current Pain Management: Electronic Signature(s) Signed: 08/09/2023 3:54:17 PM By: Frank Mu RN Entered By: [name] on 08/09/2023 09:58:03 [name] ([id number]) [id number] Page 6 of 8 -------------------------------------------------------------------------------- Patient/Caregiver Education Details Patient Name: Date of Service: [name], [name] 11/19/2024andnbsp10:00 A M Medical Record Number: [id number] Patient Account Number: [id number] Date of Birth/Gender: Treating RN: 1940-12-20 (82 y.o. [name] Primary Care Physician: [name] Other Clinician: Referring Physician: Treating Physician/Extender: [name] in Treatment: 1 Education Assessment Education Provided To: Patient Education Topics Provided Wound/Skin Impairment: Handouts: Caring for Your Ulcer Methods: Explain/Verbal Responses: Reinforcements needed Electronic Signature(s) Signed: 08/09/2023 4:47:01 PM By: Frank Stall RN, BSN Entered By: [name] on 08/09/2023 10:19:43 -------------------------------------------------------------------------------- Wound Assessment Details Patient Name: Date of Service: [name] 08/09/2023 10:00 A M Medical Record Number: [id number] Patient Account Number: [id number] Date  of Birth/Sex: Treating RN: 26-Sep-1940 (82 y.o. [name], [name] Primary Care Masaki Rothbauer: [name] Other Clinician: Referring Shantera Monts: Treating Tonio Seider/Extender: [name], [name] in Treatment: 1 Wound Status Wound Number: 1 Primary Venous Leg Ulcer Etiology: Wound Location: Left, Lateral Lower Leg Wound Status: Open Wounding Event: Bump Comorbid Cataracts, Arrhythmia, Congestive Heart Failure, Date Acquired: 06/13/2023 History: Hypertension, Gout Weeks Of Treatment: 1 Clustered Wound: No Photos Wound Measurements [name], [name] ([id number]) Length: (cm) 12 Width: (cm) 5.7 Depth: (cm) 0.3 Area: (cm) 53.721 Volume: (cm) 16.116 [id number] Page 7 of 8 % Reduction in Area: 37.4% % Reduction in Volume: 53% Epithelialization: None Wound Description Classification: Full Thickness Without Exposed Suppor Wound Margin: Distinct, outline attached Exudate Amount: Large Exudate Type: Purulent Exudate Color: yellow, brown, green t Structures Foul Odor After Cleansing: No Slough/Fibrino Yes Wound Bed Granulation Amount: Medium (34-66%) Exposed Structure Granulation Quality: Red Fascia Exposed: No Necrotic Amount: Medium (34-66%) Fat Layer (Subcutaneous Tissue) Exposed: Yes Necrotic Quality: Adherent Slough Tendon Exposed: No Muscle Exposed: No Joint Exposed: No Bone Exposed: No Periwound Skin Texture Texture Color No Abnormalities Noted: No No Abnormalities Noted: No Callus: No Atrophie Blanche: No Crepitus: No Cyanosis: No Excoriation: No Ecchymosis: No Induration: No Erythema: No Rash: No Hemosiderin Staining: Yes Scarring: No Mottled: No Pallor: No Moisture Rubor: No No Abnormalities Noted: No Dry / Scaly: No Temperature / Pain Maceration: Yes Temperature: No Abnormality Treatment Notes Wound #1 (Lower Leg) Wound Laterality: Left, Lateral Cleanser Soap and Water Discharge Instruction: May shower and wash  wound with dial antibacterial soap and water prior to dressing change. Peri-Wound Care Topical Triamcinolone Discharge Instruction: Apply Triamcinolone as directed Primary Dressing Hydrofera Blue Ready Transfer Foam, 4x5 (in/in) Discharge Instruction: Apply to wound bed as instructed Santyl Ointment Discharge  Instruction: Apply nickel thick amount to wound bed as instructed Secondary Dressing ABD Pad, 8x10 Discharge Instruction: Apply over primary dressing as directed. Woven Gauze Sponge, Non-Sterile 4x4 in Discharge Instruction: Apply over primary dressing as directed. Secured With Compression Wrap Urgo K2 Lite, (equivalent to a 3 layer) two layer compression system, regular Discharge Instruction: Apply Urgo K2 Lite as directed (alternative to 3 layer compression). Compression Stockings Add-Ons Electronic Signature(s) Signed: 08/09/2023 3:54:17 PM By: Frank Mu RN [name], ELMO11/19/2024 3:54:17 PM By: Frank Mu RN Signed: W ([id number]) [id number] Page 8 of 8 Entered By: [name] on 08/09/2023 10:04:21 -------------------------------------------------------------------------------- Vitals Details Patient Name: Date of Service: [name], POPOCA. 08/09/2023 10:00 A M Medical Record Number: [id number] Patient Account Number: [id number] Date of Birth/Sex: Treating RN: [birth year] (82 y.o. [name], [name] Primary Care Yacine Garriga: [name] Other Clinician: Referring Essie Gehret: Treating Shelda Truby/Extender: [name] in Treatment: 1 Vital Signs Time Taken: 09:57 Temperature (F): 97.9 Height (in): 64 Pulse (bpm): 49 Weight (lbs): 189.4 Respiratory Rate (breaths/min): 17 Body Mass Index (BMI): 32.5 Blood Pressure (mmHg): 182/82 Reference Range: 80 - 120 mg / dl Electronic Signature(s) Signed: 08/09/2023 3:54:17 PM By: Frank Mu RN Entered By: [name] on 08/09/2023 09:57:57

## 2023-08-16 ENCOUNTER — Encounter (HOSPITAL_BASED_OUTPATIENT_CLINIC_OR_DEPARTMENT_OTHER): Payer: Medicare Other | Admitting: General Surgery

## 2023-08-16 DIAGNOSIS — E11621 Type 2 diabetes mellitus with foot ulcer: Secondary | ICD-10-CM | POA: Diagnosis not present

## 2023-08-16 NOTE — Progress Notes (Signed)
Frank, [name] ([id number]) [id number] Page 1 of 4 Visit Report for 08/16/2023 Arrival Information Details Patient Name: Date of Service: [name], [name] 08/16/2023 2:15 PM Medical Record Number: [id number] Patient Account Number: [id number] Date of Birth/Sex: Treating RN: 1941/03/06 (82 y.o. M) Primary Care Sherlin Sonier: Linus Galas Other Clinician: Thayer Dallas Referring Mekayla Soman: Treating Preslee Regas/Extender: Nichola Sizer in Treatment: 2 Visit Information History Since Last Visit Added or deleted any medications: No Patient Arrived: Ambulatory Any new allergies or adverse reactions: No Arrival Time: 14:09 Had a fall or experienced change in No Accompanied By: wife activities of daily living that may affect Transfer Assistance: None risk of falls: Patient Identification Verified: Yes Signs or symptoms of abuse/neglect since last visito No Secondary Verification Process Completed: Yes Hospitalized since last visit: No Patient Requires Transmission-Based Precautions: No Implantable device outside of the clinic excluding No Patient Has Alerts: Yes cellular tissue based products placed in the center Patient Alerts: ABI R 0.97 since last visit: ABI L 0.61 Has Dressing in Place as Prescribed: Yes TBI 0.46 Has Compression in Place as Prescribed: Yes Pain Present Now: No Electronic Signature(s) Signed: 08/16/2023 3:43:47 PM By: Thayer Dallas Entered By: Thayer Dallas on 08/16/2023 15:40:30 -------------------------------------------------------------------------------- Compression Therapy Details Patient Name: Date of Service: Frank Musa W. 08/16/2023 2:15 PM Medical Record Number: [id number] Patient Account Number: [id number] Date of Birth/Sex: Treating RN: [birth year] (82 y.o. M) Primary Care Desirey Keahey: Linus Galas Other Clinician: Referring Jhoselin Crume: Treating Kimiah Hibner/Extender: Nichola Sizer in  Treatment: 2 Compression Therapy Performed for Wound Assessment: Wound #1 Left,Lateral Lower Leg Performed By: Clinician Thayer Dallas, Compression Type: Double Layer Electronic Signature(s) Signed: 08/16/2023 3:43:47 PM By: Thayer Dallas Entered By: Thayer Dallas on 08/16/2023 15:41:05 -------------------------------------------------------------------------------- Encounter Discharge Information Details Patient Name: Date of Service: Frank Musa W. 08/16/2023 2:15 PM Medical Record Number: [id number] Patient Account Number: [id number] ROBERTJAMES, [name] ([id number]) [id number].pdf Page 2 of 4 Date of Birth/Sex: Treating RN: [birth year] (82 y.o. M) Primary Care Any Mcneice: Other Clinician: Lurena Joiner Referring Shaneta Cervenka: Treating Brad Mcgaughy/Extender: Nichola Sizer in Treatment: 2 Encounter Discharge Information Items Discharge Condition: Stable Ambulatory Status: Ambulatory Discharge Destination: Home Transportation: Private Auto Accompanied By: wife Schedule Follow-up Appointment: Yes Clinical Summary of Care: Electronic Signature(s) Signed: 08/16/2023 3:43:47 PM By: Thayer Dallas Entered By: Thayer Dallas on 08/16/2023 15:41:45 -------------------------------------------------------------------------------- Patient/Caregiver Education Details Patient Name: Date of Service: [name] 11/26/2024andnbsp2:15 PM Medical Record Number: [id number] Patient Account Number: [id number] Date of Birth/Gender: Treating RN: Feb 09, 1941 (82 y.o. M) Primary Care Physician: Linus Galas Other Clinician: Thayer Dallas Referring Physician: Treating Physician/Extender: Nichola Sizer in Treatment: 2 Education Assessment Education Provided To: Patient Education Topics Provided Electronic Signature(s) Signed: 08/16/2023 3:43:47 PM By: Thayer Dallas Entered By: Thayer Dallas on 08/16/2023  15:41:29 -------------------------------------------------------------------------------- Wound Assessment Details Patient Name: Date of Service: Frank, [name] 08/16/2023 2:15 PM Medical Record Number: [id number] Patient Account Number: [id number] Date of Birth/Sex: Treating RN: 30-May-1941 (82 y.o. M) Primary Care Yovanny Coats: Linus Galas Other Clinician: Referring Makayla Confer: Treating Uyen Eichholz/Extender: Bosie Helper Weeks in Treatment: 2 Wound Status Wound Number: 1 Primary Etiology: Venous Leg Ulcer Wound Location: Left, Lateral Lower Leg Wound Status: Open Wounding Event: Bump Date Acquired: 06/13/2023 Weeks Of Treatment: 2 Clustered Wound: No Wound Measurements KHOL, HEINLE ([id number]) Length: (cm) 12 Width: (cm) 5.4 Depth: (cm) 0.3 Area: (cm) 50.894 Volume: (cm) 15.268 [id number] Page 3 of 4 % Reduction in Area: 40.7% %  Reduction in Volume: 55.5% Wound Description Classification: Full Thickness Without Exposed Support Exudate Amount: Large Exudate Type: Purulent Exudate Color: yellow, brown, green Structures Periwound Skin Texture Texture Color No Abnormalities Noted: No No Abnormalities Noted: No Moisture No Abnormalities Noted: No Treatment Notes Wound #1 (Lower Leg) Wound Laterality: Left, Lateral Cleanser Soap and Water Discharge Instruction: May shower and wash wound with dial antibacterial soap and water prior to dressing change. Peri-Wound Care Topical Triamcinolone Discharge Instruction: Apply Triamcinolone as directed Primary Dressing Hydrofera Blue Ready Transfer Foam, 4x5 (in/in) Discharge Instruction: Apply to wound bed as instructed Santyl Ointment Discharge Instruction: Apply nickel thick amount to wound bed as instructed Secondary Dressing ABD Pad, 8x10 Discharge Instruction: Apply over primary dressing as directed. Woven Gauze Sponge, Non-Sterile 4x4 in Discharge Instruction: Apply over  primary dressing as directed. Secured With Compression Wrap Urgo K2 Lite, (equivalent to a 3 layer) two layer compression system, regular Discharge Instruction: Apply Urgo K2 Lite as directed (alternative to 3 layer compression). Compression Stockings Add-Ons Electronic Signature(s) Signed: 08/16/2023 3:43:47 PM By: Thayer Dallas Entered By: Thayer Dallas on 08/16/2023 15:40:50 -------------------------------------------------------------------------------- Vitals Details Patient Name: Date of Service: Frank Musa W. 08/16/2023 2:15 PM Medical Record Number: [id number] Patient Account Number: [id number] Date of Birth/Sex: Treating RN: Aug 08, 1941 (82 y.o. M) Primary Care Keiaira Donlan: Linus Galas Other Clinician: Thayer Dallas Referring Ajooni Karam: Treating Caylin Nass/Extender: Nichola Sizer in Treatment: 2 JERRAD, PIONTEK Lacretia Nicks ([id number]) [id number] Page 4 of 4 Vital Signs Time Taken: 14:15 Reference Range: 80 - 120 mg / dl Height (in): 64 Weight (lbs): 189.4 Body Mass Index (BMI): 32.5 Electronic Signature(s) Signed: 08/16/2023 3:43:47 PM By: Thayer Dallas Entered By: Thayer Dallas on 08/16/2023 15:40:42

## 2023-08-17 NOTE — Progress Notes (Signed)
RONITH, RAPER ([id number]) [id number] Page 1 of 1 Visit Report for 08/16/2023 SuperBill Details Patient Name: Date of Service: [name], [name] 08/16/2023 Medical Record Number: [id number] Patient Account Number: [id number] Date of Birth/Sex: Treating RN: August 29, 1941 (82 y.o. M) Primary Care Provider: Linus Galas Other Clinician: Referring Provider: Treating Provider/Extender: Nichola Sizer in Treatment: 2 Diagnosis Coding ICD-10 Codes Code Description [id number] Chronic venous hypertension (idiopathic) with ulcer and inflammation of left lower extremity L97.828 Non-pressure chronic ulcer of other part of left lower leg with other specified severity E11.51 Type 2 diabetes mellitus with diabetic peripheral angiopathy without gangrene Facility Procedures CPT4 Code Description Modifier Quantity [id number] (Facility Use Only) [phone] - APPLY MULTLAY COMPRS LWR LT LEG 1 Electronic Signature(s) Signed: 08/16/2023 3:43:47 PM By: Thayer Dallas Signed: 08/16/2023 5:33:59 PM By: Duanne Guess MD FACS Entered By: Thayer Dallas on 08/16/2023 15:41:58

## 2023-08-23 ENCOUNTER — Other Ambulatory Visit: Payer: Self-pay | Admitting: *Deleted

## 2023-08-23 ENCOUNTER — Encounter (HOSPITAL_BASED_OUTPATIENT_CLINIC_OR_DEPARTMENT_OTHER): Payer: Medicare Other | Attending: Internal Medicine | Admitting: Internal Medicine

## 2023-08-23 DIAGNOSIS — I130 Hypertensive heart and chronic kidney disease with heart failure and stage 1 through stage 4 chronic kidney disease, or unspecified chronic kidney disease: Secondary | ICD-10-CM | POA: Insufficient documentation

## 2023-08-23 DIAGNOSIS — I872 Venous insufficiency (chronic) (peripheral): Secondary | ICD-10-CM | POA: Insufficient documentation

## 2023-08-23 DIAGNOSIS — L97828 Non-pressure chronic ulcer of other part of left lower leg with other specified severity: Secondary | ICD-10-CM | POA: Diagnosis not present

## 2023-08-23 DIAGNOSIS — I890 Lymphedema, not elsewhere classified: Secondary | ICD-10-CM | POA: Insufficient documentation

## 2023-08-23 DIAGNOSIS — E1122 Type 2 diabetes mellitus with diabetic chronic kidney disease: Secondary | ICD-10-CM | POA: Diagnosis not present

## 2023-08-23 DIAGNOSIS — E11621 Type 2 diabetes mellitus with foot ulcer: Secondary | ICD-10-CM | POA: Diagnosis present

## 2023-08-23 DIAGNOSIS — N189 Chronic kidney disease, unspecified: Secondary | ICD-10-CM | POA: Insufficient documentation

## 2023-08-23 DIAGNOSIS — E1151 Type 2 diabetes mellitus with diabetic peripheral angiopathy without gangrene: Secondary | ICD-10-CM | POA: Insufficient documentation

## 2023-08-23 DIAGNOSIS — I5032 Chronic diastolic (congestive) heart failure: Secondary | ICD-10-CM | POA: Diagnosis not present

## 2023-08-23 DIAGNOSIS — I739 Peripheral vascular disease, unspecified: Secondary | ICD-10-CM

## 2023-08-23 DIAGNOSIS — I480 Paroxysmal atrial fibrillation: Secondary | ICD-10-CM | POA: Insufficient documentation

## 2023-08-23 DIAGNOSIS — M109 Gout, unspecified: Secondary | ICD-10-CM | POA: Diagnosis not present

## 2023-08-28 NOTE — Progress Notes (Addendum)
TYHIR, RODDA ([id number]) [id number] Page 1 of 7 Visit Report for 08/23/2023 Debridement Details Patient Name: Date of Service: [name], [name] 08/23/2023 3:00 PM Medical Record Number: [id number] Patient Account Number: [id number] Date of Birth/Sex: Treating RN: [birth year] (82 y.o. M) Primary Care Provider: Linus Galas Other Clinician: Referring Provider: Treating Provider/Extender: [name] in Treatment: 3 Debridement Performed for Assessment: Wound #1 Left,Distal,Lateral Lower Leg Performed By: Physician Maxwell Caul., MD The following information was scribed by: Karie Schwalbe The information was scribed for: Baltazar Najjar Debridement Type: Debridement Severity of Tissue Pre Debridement: Fat layer exposed Level of Consciousness (Pre-procedure): Awake and Alert Pre-procedure Verification/Time Out Yes - 15:30 Taken: Start Time: 15:30 Pain Control: Lidocaine 5% topical ointment Percent of Wound Bed Debrided: 100% T Area Debrided (cm): otal 21.98 Tissue and other material debrided: Viable, Non-Viable, Slough, Subcutaneous, Slough Level: Skin/Subcutaneous Tissue Debridement Description: Excisional Instrument: Curette Bleeding: Minimum Hemostasis Achieved: Pressure Response to Treatment: Procedure was tolerated well Level of Consciousness (Post- Awake and Alert procedure): Post Debridement Measurements of Total Wound Length: (cm) 7 Width: (cm) 4 Depth: (cm) 0.3 Volume: (cm) 6.597 Character of Wound/Ulcer Post Debridement: Improved Severity of Tissue Post Debridement: Fat layer exposed Post Procedure Diagnosis Same as Pre-procedure Electronic Signature(s) Signed: 08/28/2023 9:47:40 AM By: Baltazar Najjar MD Entered By: Baltazar Najjar on 08/24/2023 04:27:46 -------------------------------------------------------------------------------- HPI Details Patient Name: Date of Service: Frank Musa W. 08/23/2023  3:00 PM Medical Record Number: [id number] Patient Account Number: [id number] Date of Birth/Sex: Treating RN: 09/04/1941 (82 y.o. M) Primary Care Provider: Linus Galas Other Clinician: Referring Provider: Treating Provider/Extender: [name] in Treatment: 3 History of Present Illness VITOR, SONDGEROTH ([id number]) [id number] Page 2 of 7 HPI Description: ADMISSION 07/28/2023. This is an 82 year old subsomewhat frail man who arrived accompanied by his wife and daughter. He has a large chronic ulcer on the left lateral lower leg. He was hospitalized from 10/30 through 07/23/2023 with a nonhealing wound in the left leg felt to have coexistent cellulitis. He was seen in the hospital by Dr. Lajoyce Corners. Discharged on Duricef and Flagyl. He is supposed to follow-up with Dr. Lajoyce Corners next week. He also saw a vein and vascular and did not feel he needed any intervention. I note that he had previously been seen earlier this year by Dr. [facility] at [facility]. Felt to have chronic venous hypertension and secondary lymphedema. At 1 point he was discovered to have maggots in this wound. Using Xeroform and T elfa. He has home health going out to see him and there is supposed to come tomorrow. Past medical history includes paroxysmal atrial fibrillation, hypertension, type 2 diabetes, chronic kidney disease, known PAD, heart failure with preserved ejection fraction He had arterial studies done on 07/21/2023. On the right his ABI was 0.97 TBI of 0.48 but he had triphasic and wave biphasic waveforms. On the left his TBI was 0.61. I do not think anything further was done. 11/13; large chronic venous insufficiency wound on the left lateral lower leg with 2 small superior satellite lesions. We used Santyl and Hydrofera Blue under and Urgo K2 light compression. He has some degree of PAD which is the reason for the Urgo K2 lite's. He seems to have tolerated this  surprisingly well. He has completed his antibiotics 11/19; large chronic venous insufficiency wound on the left lateral lower leg with 2 small satellite lesions superiorly. We are using Santyl and Hydrofera Blue under an Urgo K2 light compression.  There is seems to been some confusion about the need for home health I think it would be really in the patient's best interest to have home health change this 1 time and we will do it 1 time in clinic per week 12/3; we are following this patient for a large chronic venous ulcers on the left lateral lower leg with 2 caudal satellite lesions. We have been using Santyl, TCA Hydrofera Blue under and Urgo K2 lite. We did a PCR culture on him 2 weeks ago that showed Pseudomonas and Enterococcus. I gave him a 10-day course of Levaquin which she completed. We had referred him for a Keystone prepared topical antibiotic combination however his wife complained about the $100 co- pay being unaffordable. Therefore that was not done. The wound is really completely unchanged today Electronic Signature(s) Signed: 08/28/2023 9:47:40 AM By: Baltazar Najjar MD Entered By: Baltazar Najjar on 08/24/2023 04:30:13 -------------------------------------------------------------------------------- Physical Exam Details Patient Name: Date of Service: Frank Musa W. 08/23/2023 3:00 PM Medical Record Number: [id number] Patient Account Number: [id number] Date of Birth/Sex: Treating RN: 22-May-1941 (82 y.o. M) Primary Care Provider: Linus Galas Other Clinician: Referring Provider: Treating Provider/Extender: Tina Griffiths, Glade Stanford in Treatment: 3 Cardiovascular Dorsalis pedis pulse on the left is palpable however posterior tibial is reduced. Edema control is mediocre at best pitting edema. Notes Wound exam; large wound on the left lateral calf with 2 superior satellite lesions. Edema control not nearly as good today as it was 2 weeks ago. Again I used a #5 curette to  debride the surface removing slough and subcutaneous tissue from the wounds. This is an attempt to disrupt the biofilm. There is no evidence of surrounding skin or soft tissue infection Electronic Signature(s) Signed: 08/28/2023 9:47:40 AM By: Baltazar Najjar MD Entered By: Baltazar Najjar on 08/24/2023 04:31:47 -------------------------------------------------------------------------------- Physician Orders Details Patient Name: Date of Service: Frank Musa W. 08/23/2023 3:00 PM Medical Record Number: [id number] Patient Account Number: [id number] Date of Birth/Sex: Treating RN: 1941-03-23 (82 y.o. Dianna Limbo Primary Care Provider: Linus Galas Other Clinician: Referring Provider: Treating Provider/Extender: Mateo, Shapiro, Ellene Route ([id number]) [id number] Page 3 of 7 Weeks in Treatment: 3 Verbal / Phone Orders: No Diagnosis Coding Follow-up Appointments ppointment in 1 week. - Dr. Leanord Hawking 08/30/23 at 2:30pm Return A Other: - Vikor PCR culture taken- once it returns will call you with results. Plan if positive topical compounding antibiotics. Anesthetic (In clinic) Topical Lidocaine 5% applied to wound bed Bathing/ Shower/ Hygiene May shower with protection but do not get wound dressing(s) wet. Protect dressing(s) with water repellant cover (for example, large plastic bag) or a cast cover and may then take shower. - Do not get the left leg wet. Keep the left leg dry until the next appointment. Edema Control - Orders / Instructions Elevate legs to the level of the heart or above for 30 minutes daily and/or when sitting for 3-4 times a day throughout the day. - As tolerated. Keep legs up when sitting/lying Avoid standing for long periods of time. Home Health New wound care orders this week; continue Home Health for wound care. May utilize formulary equivalent dressing for wound treatment orders unless otherwise specified. - TCA to  periwound. Mupirocin and Gentamicin to wound bed to wound bed and Hydrafera Blue Ready over gent. and Mup. and then use cover wound -gauze,ABD pad and URGO K2 or 3 layer compression wraps. once culture returns will change primary dressing- done . Gentamicin and Mupirocin in  a Syringe for Home Health-pt. has this for you Other Home Health Orders/Instructions: - [facility] [phone] Wound Treatment Wound #1 - Lower Leg Wound Laterality: Left, Lateral, Distal Cleanser: Soap and Water 2 x Per Week/30 Days Discharge Instructions: May shower and wash wound with dial antibacterial soap and water prior to dressing change. Topical: Gentamicin 2 x Per Week/30 Days Discharge Instructions: As directed by physician Topical: Mupirocin Ointment 2 x Per Week/30 Days Discharge Instructions: Apply Mupirocin (Bactroban) as instructed Topical: Triamcinolone 2 x Per Week/30 Days Discharge Instructions: Apply Triamcinolone as directed Prim Dressing: Hydrofera Blue Ready Transfer Foam, 4x5 (in/in) 2 x Per Week/30 Days ary Discharge Instructions: Apply to wound bed as instructed Secondary Dressing: ABD Pad, 8x10 2 x Per Week/30 Days Discharge Instructions: Apply over primary dressing as directed. Secondary Dressing: Woven Gauze Sponge, Non-Sterile 4x4 in 2 x Per Week/30 Days Discharge Instructions: Apply over primary dressing as directed. Compression Wrap: Urgo K2 Lite, (equivalent to a 3 layer) two layer compression system, regular 2 x Per Week/30 Days Discharge Instructions: Apply Urgo K2 Lite as directed (alternative to 3 layer compression). Wound #2 - Lower Leg Wound Laterality: Left, Lateral, Proximal Cleanser: Soap and Water 2 x Per Week/30 Days Discharge Instructions: May shower and wash wound with dial antibacterial soap and water prior to dressing change. Topical: Gentamicin 2 x Per Week/30 Days Discharge Instructions: As directed by physician Topical: Mupirocin Ointment 2 x Per  Week/30 Days Discharge Instructions: Apply Mupirocin (Bactroban) as instructed Topical: Triamcinolone 2 x Per Week/30 Days Discharge Instructions: Apply Triamcinolone as directed Prim Dressing: Hydrofera Blue Ready Transfer Foam, 4x5 (in/in) 2 x Per Week/30 Days ary Discharge Instructions: Apply to wound bed as instructed Secondary Dressing: ABD Pad, 8x10 2 x Per Week/30 Days Discharge Instructions: Apply over primary dressing as directed. Secondary Dressing: Woven Gauze Sponge, Non-Sterile 4x4 in 2 x Per Week/30 Days Discharge Instructions: Apply over primary dressing as directed. Compression Wrap: Urgo K2 Lite, (equivalent to a 3 layer) two layer compression system, regular 2 x Per Week/30 Days [name], [name] ([id number]) [id number] Page 4 of 7 Discharge Instructions: Apply Urgo K2 Lite as directed (alternative to 3 layer compression). Electronic Signature(s) Signed: 08/25/2023 5:01:11 PM By: Karie Schwalbe RN Signed: 08/28/2023 9:47:40 AM By: Baltazar Najjar MD Entered By: Karie Schwalbe on 08/23/2023 12:57:31 -------------------------------------------------------------------------------- Problem List Details Patient Name: Date of Service: Frank Musa W. 08/23/2023 3:00 PM Medical Record Number: [id number] Patient Account Number: [id number] Date of Birth/Sex: Treating RN: 07-Feb-1941 (82 y.o. M) Primary Care Provider: Linus Galas Other Clinician: Referring Provider: Treating Provider/Extender: Tina Griffiths, Glade Stanford in Treatment: 3 Active Problems ICD-10 Encounter Code Description Active Date MDM Diagnosis I87.332 Chronic venous hypertension (idiopathic) with ulcer and inflammation of left 07/28/2023 No Yes lower extremity L97.828 Non-pressure chronic ulcer of other part of left lower leg with other specified 07/28/2023 No Yes severity E11.51 Type 2 diabetes mellitus with diabetic peripheral angiopathy without gangrene 07/28/2023 No  Yes Inactive Problems ICD-10 Code Description Active Date Inactive Date L03.116 Cellulitis of left lower limb 07/28/2023 07/28/2023 Resolved Problems Electronic Signature(s) Signed: 08/28/2023 9:47:40 AM By: Baltazar Najjar MD Entered By: Baltazar Najjar on 08/24/2023 24:40:10 -------------------------------------------------------------------------------- Progress Note Details Patient Name: Date of Service: Frank Musa W. 08/23/2023 3:00 PM Medical Record Number: [id number] Patient Account Number: [id number] Date of Birth/Sex: Treating RN: 12/02/1940 (82 y.o. M) Primary Care Provider: Linus Galas Other Clinician: [name] ([id number]) [id number] Page 5 of 7 Referring Provider: Treating Provider/Extender: Leanord Hawking,  Wynelle Beckmann, Katie Weeks in Treatment: 3 Subjective History of Present Illness (HPI) ADMISSION 07/28/2023. This is an 82 year old subsomewhat frail man who arrived accompanied by his wife and daughter. He has a large chronic ulcer on the left lateral lower leg. He was hospitalized from 10/30 through 07/23/2023 with a nonhealing wound in the left leg felt to have coexistent cellulitis. He was seen in the hospital by Dr. Lajoyce Corners. Discharged on Duricef and Flagyl. He is supposed to follow-up with Dr. Lajoyce Corners next week. He also saw a vein and vascular and did not feel he needed any intervention. I note that he had previously been seen earlier this year by Dr. Shepard [facility] at [facility]. Felt to have chronic venous hypertension and secondary lymphedema. At 1 point he was discovered to have maggots in this wound. Using Xeroform and T elfa. He has home health going out to see him and there is supposed to come tomorrow. Past medical history includes paroxysmal atrial fibrillation, hypertension, type 2 diabetes, chronic kidney disease, known PAD, heart failure with preserved ejection fraction He had arterial studies done on 07/21/2023. On the right  his ABI was 0.97 TBI of 0.48 but he had triphasic and wave biphasic waveforms. On the left his TBI was 0.61. I do not think anything further was done. 11/13; large chronic venous insufficiency wound on the left lateral lower leg with 2 small superior satellite lesions. We used Santyl and Hydrofera Blue under and Urgo K2 light compression. He has some degree of PAD which is the reason for the Urgo K2 lite's. He seems to have tolerated this surprisingly well. He has completed his antibiotics 11/19; large chronic venous insufficiency wound on the left lateral lower leg with 2 small satellite lesions superiorly. We are using Santyl and Hydrofera Blue under an Urgo K2 light compression. There is seems to been some confusion about the need for home health I think it would be really in the patient's best interest to have home health change this 1 time and we will do it 1 time in clinic per week 12/3; we are following this patient for a large chronic venous ulcers on the left lateral lower leg with 2 caudal satellite lesions. We have been using Santyl, TCA Hydrofera Blue under and Urgo K2 lite. We did a PCR culture on him 2 weeks ago that showed Pseudomonas and Enterococcus. I gave him a 10-day course of Levaquin which she completed. We had referred him for a [facility] prepared topical antibiotic combination however his wife complained about the $100 co- pay being unaffordable. Therefore that was not done. The wound is really completely unchanged today Objective Constitutional Vitals Time Taken: 4:00 PM, Height: 64 in, Weight: 189.4 lbs, BMI: 32.5, Temperature: 97.8 F, Pulse: 52 bpm, Respiratory Rate: 18 breaths/min, Blood Pressure: 177/85 mmHg. Cardiovascular Dorsalis pedis pulse on the left is palpable however posterior tibial is reduced. Edema control is mediocre at best pitting edema. General Notes: Wound exam; large wound on the left lateral calf with 2 superior satellite lesions. Edema control not  nearly as good today as it was 2 weeks ago. Again I used a #5 curette to debride the surface removing slough and subcutaneous tissue from the wounds. This is an attempt to disrupt the biofilm. There is no evidence of surrounding skin or soft tissue infection Integumentary (Hair, Skin) Wound #1 status is Open. Original cause of wound was Bump. The date acquired was: 06/13/2023. The wound has been in treatment 3 weeks. The wound is  located on the Left,Distal,Lateral Lower Leg. The wound measures 7cm length x 4cm width x 0.3cm depth; 21.991cm^2 area and 6.597cm^3 volume. There is Fat Layer (Subcutaneous Tissue) exposed. There is no tunneling or undermining noted. There is a large amount of serosanguineous drainage noted. There is large (67-100%) red granulation within the wound bed. There is a small (1-33%) amount of necrotic tissue within the wound bed including Eschar and Adherent Slough. The periwound skin appearance had no abnormalities noted for moisture. The periwound skin appearance exhibited: Scarring, Hemosiderin Staining. Periwound temperature was noted as No Abnormality. Wound #2 status is Open. Original cause of wound was Gradually Appeared. The date acquired was: 08/22/2023. The wound is located on the Left,Proximal,Lateral Lower Leg. The wound measures 1.5cm length x 4cm width x 0.1cm depth; 4.712cm^2 area and 0.471cm^3 volume. There is Fat Layer (Subcutaneous Tissue) exposed. There is no tunneling or undermining noted. There is a large amount of serosanguineous drainage noted. The wound margin is distinct with the outline attached to the wound base. There is small (1-33%) red granulation within the wound bed. There is a large (67-100%) amount of necrotic tissue within the wound bed including Eschar and Adherent Slough. The periwound skin appearance had no abnormalities noted for moisture. The periwound skin appearance exhibited: Scarring, Hemosiderin Staining. Periwound temperature was noted  as No Abnormality. Assessment Active Problems ICD-10 Chronic venous hypertension (idiopathic) with ulcer and inflammation of left lower extremity Non-pressure chronic ulcer of other part of left lower leg with other specified severity Type 2 diabetes mellitus with diabetic peripheral angiopathy without gangrene [name], [name] ([id number]) [id number] Page 6 of 7 Procedures Wound #1 Pre-procedure diagnosis of Wound #1 is a Venous Leg Ulcer located on the Left,Distal,Lateral Lower Leg .Severity of Tissue Pre Debridement is: Fat layer exposed. There was a Excisional Skin/Subcutaneous Tissue Debridement with a total area of 21.98 sq cm performed by Maxwell Caul., MD. With the following instrument(s): Curette to remove Viable and Non-Viable tissue/material. Material removed includes Subcutaneous Tissue and Slough and after achieving pain control using Lidocaine 5% topical ointment. No specimens were taken. A time out was conducted at 15:30, prior to the start of the procedure. A Minimum amount of bleeding was controlled with Pressure. The procedure was tolerated well. Post Debridement Measurements: 7cm length x 4cm width x 0.3cm depth; 6.597cm^3 volume. Character of Wound/Ulcer Post Debridement is improved. Severity of Tissue Post Debridement is: Fat layer exposed. Post procedure Diagnosis Wound #1: Same as Pre-Procedure Pre-procedure diagnosis of Wound #1 is a Venous Leg Ulcer located on the Left,Distal,Lateral Lower Leg . There was a Three Layer Compression Therapy Procedure by Karie Schwalbe, RN. Post procedure Diagnosis Wound #1: Same as Pre-Procedure Notes: URGO K2 LITE. Plan Follow-up Appointments: Return Appointment in 1 week. - Dr. Leanord Hawking 08/30/23 at 2:30pm Other: - Vikor PCR culture taken- once it returns will call you with results. Plan if positive topical compounding antibiotics. Anesthetic: (In clinic) Topical Lidocaine 5% applied to wound  bed Bathing/ Shower/ Hygiene: May shower with protection but do not get wound dressing(s) wet. Protect dressing(s) with water repellant cover (for example, large plastic bag) or a cast cover and may then take shower. - Do not get the left leg wet. Keep the left leg dry until the next appointment. Edema Control - Orders / Instructions: Elevate legs to the level of the heart or above for 30 minutes daily and/or when sitting for 3-4 times a day throughout the day. - As tolerated. Keep legs up when  sitting/lying Avoid standing for long periods of time. Home Health: New wound care orders this week; continue Home Health for wound care. May utilize formulary equivalent dressing for wound treatment orders unless otherwise specified. - TCA to periwound. Mupirocin and Gentamicin to wound bed to wound bed and Hydrafera Blue Ready over gent. and Mup. and then use cover wound -gauze,ABD pad and URGO K2 or 3 layer compression wraps. once culture returns will change primary dressing-done . Gentamicin and Mupirocin in a Syringe for Home Health-pt. has this for you Other Home Health Orders/Instructions: - [facility] fax:[phone] WOUND #1: - Lower Leg Wound Laterality: Left, Lateral, Distal Cleanser: Soap and Water 2 x Per Week/30 Days Discharge Instructions: May shower and wash wound with dial antibacterial soap and water prior to dressing change. Topical: Gentamicin 2 x Per Week/30 Days Discharge Instructions: As directed by physician Topical: Mupirocin Ointment 2 x Per Week/30 Days Discharge Instructions: Apply Mupirocin (Bactroban) as instructed Topical: Triamcinolone 2 x Per Week/30 Days Discharge Instructions: Apply Triamcinolone as directed Prim Dressing: Hydrofera Blue Ready Transfer Foam, 4x5 (in/in) 2 x Per Week/30 Days ary Discharge Instructions: Apply to wound bed as instructed Secondary Dressing: ABD Pad, 8x10 2 x Per Week/30 Days Discharge Instructions: Apply over primary  dressing as directed. Secondary Dressing: Woven Gauze Sponge, Non-Sterile 4x4 in 2 x Per Week/30 Days Discharge Instructions: Apply over primary dressing as directed. Com pression Wrap: Urgo K2 Lite, (equivalent to a 3 layer) two layer compression system, regular 2 x Per Week/30 Days Discharge Instructions: Apply Urgo K2 Lite as directed (alternative to 3 layer compression). WOUND #2: - Lower Leg Wound Laterality: Left, Lateral, Proximal Cleanser: Soap and Water 2 x Per Week/30 Days Discharge Instructions: May shower and wash wound with dial antibacterial soap and water prior to dressing change. Topical: Gentamicin 2 x Per Week/30 Days Discharge Instructions: As directed by physician Topical: Mupirocin Ointment 2 x Per Week/30 Days Discharge Instructions: Apply Mupirocin (Bactroban) as instructed Topical: Triamcinolone 2 x Per Week/30 Days Discharge Instructions: Apply Triamcinolone as directed Prim Dressing: Hydrofera Blue Ready Transfer Foam, 4x5 (in/in) 2 x Per Week/30 Days ary Discharge Instructions: Apply to wound bed as instructed Secondary Dressing: ABD Pad, 8x10 2 x Per Week/30 Days Discharge Instructions: Apply over primary dressing as directed. Secondary Dressing: Woven Gauze Sponge, Non-Sterile 4x4 in 2 x Per Week/30 Days Discharge Instructions: Apply over primary dressing as directed. Com pression Wrap: Urgo K2 Lite, (equivalent to a 3 layer) two layer compression system, regular 2 x Per Week/30 Days Discharge Instructions: Apply Urgo K2 Lite as directed (alternative to 3 layer compression). 1. In view of the PCR deep tissue culture result I have applied mupirocin and gentamicin which should cover the most pathogenic organisms. We will continue with Hydrofera Blue under Urgo K2 lite 2. The patient has PAD. He has a appointment with vein and vascular in roughly 2 weeks. For this reason I am reluctant to go to 4-layer equivalent compression. 3. I have put the Santyl on hold  although it certainly possible we will have to go back to that. I did like to have roughly 2 weeks of topical antibiotics. As mentioned he has already completed the Levaquin 4 mechanical debridements are likely to be required for the next several visits [name], [name] ([id number]) [id number] Page 7 of 7 Electronic Signature(s) Signed: 09/07/2023 5:35:56 PM By: Shawn Stall RN, BSN Signed: 09/13/2023 11:51:18 AM By: Baltazar Najjar MD Previous Signature: 08/28/2023 9:47:40 AM Version By: Baltazar Najjar MD  Entered By: Shawn Stall on 09/07/2023 14:18:23 -------------------------------------------------------------------------------- SuperBill Details Patient Name: Date of Service: [name], [name] 08/23/2023 Medical Record Number: [id number] Patient Account Number: [id number] Date of Birth/Sex: Treating RN: 1941/01/26 (82 y.o. M) Primary Care Provider: Linus Galas Other Clinician: Referring Provider: Treating Provider/Extender: Tina Griffiths, Glade Stanford in Treatment: 3 Diagnosis Coding ICD-10 Codes Code Description [phone] Chronic venous hypertension (idiopathic) with ulcer and inflammation of left lower extremity L97.828 Non-pressure chronic ulcer of other part of left lower leg with other specified severity E11.51 Type 2 diabetes mellitus with diabetic peripheral angiopathy without gangrene Facility Procedures : CPT4 Code: [id number] Description: 11042 - DEB SUBQ TISSUE 20 SQ CM/< ICD-10 Diagnosis Description I87.332 Chronic venous hypertension (idiopathic) with ulcer and inflammation of left l L97.828 Non-pressure chronic ulcer of other part of left lower leg with other specifie  E11.51 Type 2 diabetes mellitus with diabetic peripheral angiopathy without gangrene Modifier: ower extremity d severity Quantity: 1 : CPT4 Code: 62130865 Description: 11045 - DEB SUBQ TISS EA ADDL 20CM ICD-10 Diagnosis Description I87.332 Chronic venous hypertension  (idiopathic) with ulcer and inflammation of left l L97.828 Non-pressure chronic ulcer of other part of left lower leg with other specifie  E11.51 Type 2 diabetes mellitus with diabetic peripheral angiopathy without gangrene Modifier: ower extremity d severity Quantity: 1 Physician Procedures : CPT4 Code Description Modifier [id number] 11042 - WC PHYS SUBQ TISS 20 SQ CM ICD-10 Diagnosis Description I87.332 Chronic venous hypertension (idiopathic) with ulcer and inflammation of left lower extremity L97.828 Non-pressure chronic ulcer of other part  of left lower leg with other specified severity E11.51 Type 2 diabetes mellitus with diabetic peripheral angiopathy without gangrene Quantity: 1 : [id number] 11045 - WC PHYS SUBQ TISS EA ADDL 20 CM ICD-10 Diagnosis Description I87.332 Chronic venous hypertension (idiopathic) with ulcer and inflammation of left lower extremity L97.828 Non-pressure chronic ulcer of other part of left lower leg with  other specified severity E11.51 Type 2 diabetes mellitus with diabetic peripheral angiopathy without gangrene Quantity: 1 Electronic Signature(s) Signed: 08/28/2023 9:47:40 AM By: Baltazar Najjar MD Entered By: Baltazar Najjar on 08/24/2023 04:39:05

## 2023-08-28 NOTE — Progress Notes (Signed)
[name], [name] ([id number]) [id number] Page 1 of 8 Visit Report for 08/23/2023 Arrival Information Details Patient Name: Date of Service: [name], [name] 08/23/2023 3:00 PM Medical Record Number: [id number] Patient Account Number: [id number] Date of Birth/Sex: Treating RN: Apr 02, 1941 (82 y.o. Dianna Limbo Primary Care Fritzi Scripter: Linus Galas Other Clinician: Referring Temima Kutsch: Treating Danton Palmateer/Extender: [name] in Treatment: 3 Visit Information History Since Last Visit Added or deleted any medications: No Patient Arrived: Gilmer Mor Any new allergies or adverse reactions: No Arrival Time: 16:00 Had a fall or experienced change in No Accompanied By: spouse activities of daily living that may affect Transfer Assistance: None risk of falls: Patient Identification Verified: Yes Signs or symptoms of abuse/neglect since last visito No Patient Requires Transmission-Based Precautions: No Hospitalized since last visit: No Patient Has Alerts: Yes Implantable device outside of the clinic excluding No Patient Alerts: ABI R 0.97 cellular tissue based products placed in the center ABI L 0.61 since last visit: TBI 0.46 Has Dressing in Place as Prescribed: Yes Pain Present Now: No Electronic Signature(s) Signed: 08/25/2023 5:01:11 PM By: Karie Schwalbe RN Entered By: Karie Schwalbe on 08/24/2023 11:32:24 -------------------------------------------------------------------------------- Compression Therapy Details Patient Name: Date of Service: Frank Musa W. 08/23/2023 3:00 PM Medical Record Number: [id number] Patient Account Number: [id number] Date of Birth/Sex: Treating RN: 1940/12/25 (82 y.o. Dianna Limbo Primary Care Claudeen Leason: Linus Galas Other Clinician: Referring Fielding Mault: Treating Ellora Varnum/Extender: [name] in Treatment: 3 Compression Therapy Performed for Wound Assessment: Wound #1  Left,Distal,Lateral Lower Leg Performed By: Clinician Karie Schwalbe, RN Compression Type: Three Layer Post Procedure Diagnosis Same as Pre-procedure Notes URGO K2 LITE Electronic Signature(s) Signed: 08/25/2023 5:01:11 PM By: Karie Schwalbe RN Entered By: Karie Schwalbe on 08/23/2023 15:47:51 [name] ([id number]) [id number] Page 2 of 8 -------------------------------------------------------------------------------- Encounter Discharge Information Details Patient Name: Date of Service: [name], [name] 08/23/2023 3:00 PM Medical Record Number: [id number] Patient Account Number: [id number] Date of Birth/Sex: Treating RN: 05-26-1941 (82 y.o. Dianna Limbo Primary Care Lindyn Vossler: Linus Galas Other Clinician: Referring Keryn Nessler: Treating Williamson Cavanah/Extender: [name] in Treatment: 3 Encounter Discharge Information Items Post Procedure Vitals Discharge Condition: Stable Temperature (F): 97.8 Ambulatory Status: Cane Pulse (bpm): 52 Discharge Destination: Home Respiratory Rate (breaths/min): 18 Transportation: Private Auto Blood Pressure (mmHg): 177/85 Accompanied By: spouse Schedule Follow-up Appointment: Yes Clinical Summary of Care: Patient Declined Electronic Signature(s) Signed: 08/25/2023 5:01:11 PM By: Karie Schwalbe RN Entered By: Karie Schwalbe on 08/24/2023 11:42:09 -------------------------------------------------------------------------------- Lower Extremity Assessment Details Patient Name: Date of Service: [name], [name] 08/23/2023 3:00 PM Medical Record Number: [id number] Patient Account Number: [id number] Date of Birth/Sex: Treating RN: Jan 24, 1941 (82 y.o. Dianna Limbo Primary Care Hartleigh Edmonston: Linus Galas Other Clinician: Referring Zniyah Midkiff: Treating Emily Massar/Extender: [name] in Treatment: 3 Edema Assessment Assessed: [Left: No] [Right: No] Edema: [Left: Ye]  [Right: s] Calf Left: Right: Point of Measurement: 30 cm From Medial Instep 33.5 cm Ankle Left: Right: Point of Measurement: 12 cm From Medial Instep 22 cm Vascular Assessment Pulses: Dorsalis Pedis Palpable: [Left:Yes] Extremity colors, hair growth, and conditions: Extremity Color: [Left:Red] Hair Growth on Extremity: [Left:No] Temperature of Extremity: [Left:Warm] Capillary Refill: [Left:< 3 seconds] Dependent Rubor: [Left:No Yes] Electronic Signature(s) [place], DIRKSEN ([id number]) [id number] Page 3 of 8 Signed: 08/25/2023 5:01:11 PM By: Karie Schwalbe RN Entered By: Karie Schwalbe on 08/24/2023 11:35:53 -------------------------------------------------------------------------------- Multi Wound Chart Details Patient Name: Date of Service: [name], [name] Stage W. 08/23/2023 3:00 PM  Medical Record Number: [id number] Patient Account Number: [id number] Date of Birth/Sex: Treating RN: January 10, 1941 (82 y.o. M) Primary Care Robertson Colclough: Linus Galas Other Clinician: Referring Shaquila Sigman: Treating Kiwanna Spraker/Extender: [name] in Treatment: 3 [1:Photos: No Photos Left, Distal, Lateral Lower Leg Wound Location: Bump Wounding Event: Venous Leg Ulcer Primary Etiology: Cataracts, Arrhythmia, Congestive Cataracts, Arrhythmia, Congestive N/A Comorbid History: Heart Failure, Hypertension, Gout 06/13/2023 Date  Acquired: 3 Weeks of Treatment: Open Wound Status: No Wound Recurrence: No Clustered Wound: 7x4x0.3 Measurements L x W x D (cm) 21.991 A (cm) : rea 6.597 Volume (cm) : 74.40% % Reduction in A rea: 80.80% % Reduction in Volume: Full Thickness Without  Exposed Classification: Support Structures Large Exudate A mount: Serosanguineous Exudate Type: red, brown Exudate Color: N/A Wound Margin: Large (67-100%) Granulation A mount: Red Granulation Quality: Small (1-33%) Necrotic A mount: Eschar, Adherent  Slough Necrotic Tissue: Fat Layer (Subcutaneous  Tissue): Yes Fat Layer (Subcutaneous Tissue): Yes N/A Exposed Structures: Fascia: No Tendon: No Muscle: No Joint: No Bone: No Small (1-33%) Epithelialization: Debridement - Excisional Debridement:  Pre-procedure Verification/Time Out 15:30 Taken: Lidocaine 5% topical ointment Pain Control: Subcutaneous, Slough Tissue Debrided: Skin/Subcutaneous Tissue Level: 21.98 Debridement A (sq cm): rea Curette Instrument: Minimum Bleeding: Pressure Hemostasis  A chieved: Procedure was tolerated well Debridement Treatment Response: 7x4x0.3 Post Debridement Measurements L x W x D (cm) 6.597 Post Debridement Volume: (cm) Scarring: Yes Periwound Skin Texture: No Abnormalities Noted Periwound Skin Moisture:  Hemosiderin Staining: Yes Periwound Skin Color: No Abnormality Temperature: Compression Therapy Procedures Performed: Debridement] [2:No Photos Left, Proximal, Lateral Lower Leg Gradually Appeared Venous Leg Ulcer Heart Failure, Hypertension, Gout  08/22/2023 0 Open No Yes 1.5x4x0.1 4.712 0.471 N/A N/A Full Thickness Without Exposed Support Structures Large Serosanguineous red, brown Distinct, outline attached Small (1-33%) Red Large (67-100%) Eschar, Adherent Slough Fascia: No Tendon: No Muscle: No  Joint: No Bone: No Small (1-33%) N/A N/A N/A N/A N/A N/A N/A N/A N/A N/A N/A N/A Scarring: Yes No Abnormalities Noted Hemosiderin Staining: Yes No Abnormality N/A] [N/A:N/A N/A N/A N/A N/A N/A N/A N/A N/A N/A N/A N/A N/A N/A N/A N/A N/A N/A N/A N/A N/A  N/A N/A N/A N/A N/A N/A N/A N/A N/A N/A N/A N/A N/A N/A N/A N/A N/A N/A N/A N/A] Treatment Notes Electronic Signature(s) Signed: 08/28/2023 9:47:40 AM By: Baltazar Najjar MD Wurm, ELMO12/04/2023 9:47:40 AM By: Baltazar Najjar MD Signed: W ([id number]) [id number] Page 4 of 8 Entered By: Baltazar Najjar on 08/24/2023 07:27:36 -------------------------------------------------------------------------------- Multi-Disciplinary Care Plan  Details Patient Name: Date of Service: [name], [name] 08/23/2023 3:00 PM Medical Record Number: [id number] Patient Account Number: [id number] Date of Birth/Sex: Treating RN: July 03, 1941 (82 y.o. Dianna Limbo Primary Care Jennings Stirling: Linus Galas Other Clinician: Referring Nandi Tonnesen: Treating Marvens Hollars/Extender: Tina Griffiths, Glade Stanford in Treatment: 3 Active Inactive Wound/Skin Impairment Nursing Diagnoses: Impaired tissue integrity Goals: Patient/caregiver will verbalize understanding of skin care regimen Date Initiated: 07/28/2023 Target Resolution Date: 04/12/2024 Goal Status: Active Interventions: Assess ulceration(s) every visit Treatment Activities: Skin care regimen initiated : 07/28/2023 Notes: Electronic Signature(s) Signed: 08/25/2023 5:01:11 PM By: Karie Schwalbe RN Entered By: Karie Schwalbe on 08/24/2023 11:36:22 -------------------------------------------------------------------------------- Pain Assessment Details Patient Name: Date of Service: Frank Musa W. 08/23/2023 3:00 PM Medical Record Number: [id number] Patient Account Number: [id number] Date of Birth/Sex: Treating RN: 12-20-1940 (82 y.o. Dianna Limbo Primary Care Ilka Lovick: Linus Galas Other Clinician: Referring Nalany Steedley: Treating Juwan Vences/Extender: [name] in Treatment: 3 Active Problems Location of  Pain Severity and Description of Pain Patient Has Paino No Site Locations [place], [place] W ([id number]) [id number] Page 5 of 8 Pain Management and Medication Current Pain Management: Electronic Signature(s) Signed: 08/25/2023 5:01:11 PM By: Karie Schwalbe RN Entered By: Karie Schwalbe on 08/24/2023 11:34:44 -------------------------------------------------------------------------------- Patient/Caregiver Education Details Patient Name: Date of Service: [name] 12/3/2024andnbsp3:00 PM Medical Record Number:  [id number] Patient Account Number: [id number] Date of Birth/Gender: Treating RN: [birth year] (82 y.o. Dianna Limbo Primary Care Physician: Linus Galas Other Clinician: Referring Physician: Treating Physician/Extender: [name] in Treatment: 3 Education Assessment Education Provided To: Patient Education Topics Provided Wound/Skin Impairment: Methods: Explain/Verbal Responses: State content correctly Electronic Signature(s) Signed: 08/25/2023 5:01:11 PM By: Karie Schwalbe RN Entered By: Karie Schwalbe on 08/24/2023 11:37:07 -------------------------------------------------------------------------------- Wound Assessment Details Patient Name: Date of Service: [name], [name] 08/23/2023 3:00 PM Medical Record Number: [id number] Patient Account Number: [id number] Date of Birth/Sex: Treating RN: [birth year] (82 y.o. Dianna Limbo Primary Care Loui Massenburg: Linus Galas Other Clinician: Referring Oniya Mandarino: Treating Taeshaun Rames/Extender: Rayce, Chung, [place] ([id number]) [id number] Page 6 of 8 Weeks in Treatment: 3 Wound Status Wound Number: 1 Primary Venous Leg Ulcer Etiology: Wound Location: Left, Distal, Lateral Lower Leg Wound Status: Open Wounding Event: Bump Comorbid Cataracts, Arrhythmia, Congestive Heart Failure, Date Acquired: 06/13/2023 History: Hypertension, Gout Weeks Of Treatment: 3 Clustered Wound: No Wound Measurements Length: (cm) 7 Width: (cm) 4 Depth: (cm) 0.3 Area: (cm) 21.991 Volume: (cm) 6.597 % Reduction in Area: 74.4% % Reduction in Volume: 80.8% Epithelialization: Small (1-33%) Tunneling: No Undermining: No Wound Description Classification: Full Thickness Without Exposed Support Structures Exudate Amount: Large Exudate Type: Serosanguineous Exudate Color: red, brown Foul Odor After Cleansing: No Slough/Fibrino Yes Wound Bed Granulation Amount: Large (67-100%) Exposed  Structure Granulation Quality: Red Fascia Exposed: No Necrotic Amount: Small (1-33%) Fat Layer (Subcutaneous Tissue) Exposed: Yes Necrotic Quality: Eschar, Adherent Slough Tendon Exposed: No Muscle Exposed: No Joint Exposed: No Bone Exposed: No Periwound Skin Texture Texture Color No Abnormalities Noted: No No Abnormalities Noted: No Scarring: Yes Hemosiderin Staining: Yes Moisture Temperature / Pain No Abnormalities Noted: Yes Temperature: No Abnormality Treatment Notes Wound #1 (Lower Leg) Wound Laterality: Left, Lateral, Distal Cleanser Soap and Water Discharge Instruction: May shower and wash wound with dial antibacterial soap and water prior to dressing change. Peri-Wound Care Topical Gentamicin Discharge Instruction: As directed by physician Mupirocin Ointment Discharge Instruction: Apply Mupirocin (Bactroban) as instructed Triamcinolone Discharge Instruction: Apply Triamcinolone as directed Primary Dressing Hydrofera Blue Ready Transfer Foam, 4x5 (in/in) Discharge Instruction: Apply to wound bed as instructed Secondary Dressing ABD Pad, 8x10 Discharge Instruction: Apply over primary dressing as directed. Woven Gauze Sponge, Non-Sterile 4x4 in Discharge Instruction: Apply over primary dressing as directed. Secured With Compression Wrap Urgo K2 Lite, (equivalent to a 3 layer) two layer compression system, regular Discharge Instruction: Apply Urgo K2 Lite as directed (alternative to 3 layer compression). BODHI, SIEW ([id number]) [id number] Page 7 of 8 Compression Stockings Add-Ons Electronic Signature(s) Signed: 08/25/2023 5:01:11 PM By: Karie Schwalbe RN Entered By: Karie Schwalbe on 08/23/2023 15:44:02 -------------------------------------------------------------------------------- Wound Assessment Details Patient Name: Date of Service: [name], [name] 08/23/2023 3:00 PM Medical Record Number: [id number] Patient Account  Number: [id number] Date of Birth/Sex: Treating RN: [birth year] (82 y.o. Dianna Limbo Primary Care Tisha Cline: Linus Galas Other Clinician: Referring Keoshia Steinmetz: Treating Micah Barnier/Extender: [name] in Treatment: 3 Wound Status Wound Number: 2 Primary Venous Leg Ulcer Etiology: Wound Location: Left, Proximal, Lateral  Lower Leg Wound Status: Open Wounding Event: Gradually Appeared Comorbid Cataracts, Arrhythmia, Congestive Heart Failure, Date Acquired: 08/22/2023 History: Hypertension, Gout Weeks Of Treatment: 0 Clustered Wound: Yes Wound Measurements Length: (cm) 1.5 Width: (cm) 4 Depth: (cm) 0.1 Area: (cm) 4.712 Volume: (cm) 0.471 % Reduction in Area: % Reduction in Volume: Epithelialization: Small (1-33%) Tunneling: No Undermining: No Wound Description Classification: Full Thickness Without Exposed Support Structures Wound Margin: Distinct, outline attached Exudate Amount: Large Exudate Type: Serosanguineous Exudate Color: red, brown Foul Odor After Cleansing: No Slough/Fibrino Yes Wound Bed Granulation Amount: Small (1-33%) Exposed Structure Granulation Quality: Red Fascia Exposed: No Necrotic Amount: Large (67-100%) Fat Layer (Subcutaneous Tissue) Exposed: Yes Necrotic Quality: Eschar, Adherent Slough Tendon Exposed: No Muscle Exposed: No Joint Exposed: No Bone Exposed: No Periwound Skin Texture Texture Color No Abnormalities Noted: No No Abnormalities Noted: No Scarring: Yes Hemosiderin Staining: Yes Moisture Temperature / Pain No Abnormalities Noted: Yes Temperature: No Abnormality Treatment Notes Wound #2 (Lower Leg) Wound Laterality: Left, Lateral, Proximal Cleanser Soap and Water Discharge Instruction: May shower and wash wound with dial antibacterial soap and water prior to dressing change. Peri-Wound Care LYNFORD, SHYTLE ([id number]) [id number] Page 8 of 8 Topical Gentamicin Discharge  Instruction: As directed by physician Mupirocin Ointment Discharge Instruction: Apply Mupirocin (Bactroban) as instructed Triamcinolone Discharge Instruction: Apply Triamcinolone as directed Primary Dressing Hydrofera Blue Ready Transfer Foam, 4x5 (in/in) Discharge Instruction: Apply to wound bed as instructed Secondary Dressing ABD Pad, 8x10 Discharge Instruction: Apply over primary dressing as directed. Woven Gauze Sponge, Non-Sterile 4x4 in Discharge Instruction: Apply over primary dressing as directed. Secured With Compression Wrap Urgo K2 Lite, (equivalent to a 3 layer) two layer compression system, regular Discharge Instruction: Apply Urgo K2 Lite as directed (alternative to 3 layer compression). Compression Stockings Add-Ons Electronic Signature(s) Signed: 08/25/2023 5:01:11 PM By: Karie Schwalbe RN Entered By: Karie Schwalbe on 08/23/2023 15:45:49 -------------------------------------------------------------------------------- Vitals Details Patient Name: Date of Service: Frank Musa W. 08/23/2023 3:00 PM Medical Record Number: [id number] Patient Account Number: [id number] Date of Birth/Sex: Treating RN: [birth year] (82 y.o. Dianna Limbo Primary Care Dorsie Sethi: Linus Galas Other Clinician: Referring Ming Mcmannis: Treating Rori Goar/Extender: [name] in Treatment: 3 Vital Signs Time Taken: 16:00 Temperature (F): 97.8 Height (in): 64 Pulse (bpm): 52 Weight (lbs): 189.4 Respiratory Rate (breaths/min): 18 Body Mass Index (BMI): 32.5 Blood Pressure (mmHg): 177/85 Reference Range: 80 - 120 mg / dl Electronic Signature(s) Signed: 08/25/2023 5:01:11 PM By: Karie Schwalbe RN Entered By: Karie Schwalbe on 08/24/2023 11:34:36

## 2023-08-30 ENCOUNTER — Encounter (HOSPITAL_BASED_OUTPATIENT_CLINIC_OR_DEPARTMENT_OTHER): Payer: Medicare Other | Admitting: Internal Medicine

## 2023-08-30 DIAGNOSIS — E11621 Type 2 diabetes mellitus with foot ulcer: Secondary | ICD-10-CM | POA: Diagnosis not present

## 2023-08-31 NOTE — Progress Notes (Signed)
Frank, [name] ([id number]) [id number] Page 1 of 10 Visit Report for 08/30/2023 Arrival Information Details Patient Name: Date of Service: [name], [name] 08/30/2023 2:30 PM Medical Record Number: [id number] Patient Account Number: [id number] Date of Birth/Sex: Treating RN: [birth year] (82 y.o. [name], Millard.Loa Primary Care Leo Weyandt: Linus Galas Other Clinician: Referring Jamelle Goldston: Treating Fahmida Jurich/Extender: [name] in Treatment: 4 Visit Information History Since Last Visit Added or deleted any medications: No Patient Arrived: Ambulatory Any new allergies or adverse reactions: No Arrival Time: 14:25 Had a fall or experienced change in No Accompanied By: wife activities of daily living that may affect Transfer Assistance: None risk of falls: Patient Identification Verified: Yes Signs or symptoms of abuse/neglect since last visito No Secondary Verification Process Completed: Yes Hospitalized since last visit: No Patient Requires Transmission-Based Precautions: No Implantable device outside of the clinic excluding No Patient Has Alerts: Yes cellular tissue based products placed in the center Patient Alerts: ABI R 0.97 since last visit: ABI L 0.61 Has Dressing in Place as Prescribed: Yes TBI 0.46 Has Compression in Place as Prescribed: Yes Pain Present Now: No Electronic Signature(s) Signed: 08/30/2023 5:09:16 PM By: Shawn Stall RN, BSN Entered By: Shawn Stall on 08/30/2023 11:39:07 -------------------------------------------------------------------------------- Compression Therapy Details Patient Name: Date of Service: [name] 08/30/2023 2:30 PM Medical Record Number: [id number] Patient Account Number: [id number] Date of Birth/Sex: Treating RN: 1941/08/15 (82 y.o. [name] Primary Care Marcea Rojek: Linus Galas Other Clinician: Referring Keshanna Riso: Treating Lateesha Bezold/Extender: [name] in Treatment: 4 Compression Therapy Performed for Wound Assessment: Wound #1 Left,Distal,Lateral Lower Leg Performed By: Clinician Shawn Stall, RN Compression Type: Double Layer Post Procedure Diagnosis Same as Pre-procedure Electronic Signature(s) Signed: 08/30/2023 5:09:16 PM By: Shawn Stall RN, BSN Entered By: Shawn Stall on 08/30/2023 11:52:57 [name] ([id number]) [id number] Page 2 of 10 -------------------------------------------------------------------------------- Compression Therapy Details Patient Name: Date of Service: Frank, [name] 08/30/2023 2:30 PM Medical Record Number: [id number] Patient Account Number: [id number] Date of Birth/Sex: Treating RN: 1941/08/10 (82 y.o. [name] Primary Care Randie Bloodgood: Linus Galas Other Clinician: Referring Andriana Casa: Treating [name]/Extender: [name] in Treatment: 4 Compression Therapy Performed for Wound Assessment: Wound #2 Left,Proximal,Lateral Lower Leg Performed By: Clinician Shawn Stall, RN Compression Type: Double Layer Post Procedure Diagnosis Same as Pre-procedure Electronic Signature(s) Signed: 08/30/2023 5:09:16 PM By: Shawn Stall RN, BSN Entered By: Shawn Stall on 08/30/2023 11:52:57 -------------------------------------------------------------------------------- Encounter Discharge Information Details Patient Name: Date of Service: [name] 08/30/2023 2:30 PM Medical Record Number: [id number] Patient Account Number: [id number] Date of Birth/Sex: Treating RN: Dec 28, 1940 (82 y.o. [name] Primary Care Maclain Cohron: Linus Galas Other Clinician: Referring Tarrin Lebow: Treating Muneer Leider/Extender: [name] in Treatment: 4 Encounter Discharge Information Items Discharge Condition: Stable Ambulatory Status: Ambulatory Discharge Destination: Home Transportation: Private Auto Accompanied By:  spouse Schedule Follow-up Appointment: Yes Clinical Summary of Care: Electronic Signature(s) Signed: 08/30/2023 5:09:16 PM By: Shawn Stall RN, BSN Entered By: Shawn Stall on 08/30/2023 11:54:57 -------------------------------------------------------------------------------- Lower Extremity Assessment Details Patient Name: Date of Service: [name] 08/30/2023 2:30 PM Medical Record Number: [id number] Patient Account Number: [id number] Date of Birth/Sex: Treating RN: [birth year] (82 y.o. [name] Primary Care Yenty Bloch: Linus Galas Other Clinician: Referring Dorette Hartel: Treating [name]/Extender: Tina Griffiths, Glade Stanford in Treatment: 4 Edema Assessment Assessed: [name]: Yes] Franne Forts: No] Edema: [Left: Ye] [Right: s] Calf KENNIS, NAEGELE ([id number]) [id number] Page 3 of 10 Left: Right:  Point of Measurement: 30 cm From Medial Instep 35 cm Ankle Left: Right: Point of Measurement: 12 cm From Medial Instep 23 cm Vascular Assessment Pulses: Dorsalis Pedis Palpable: [Left:Yes] Extremity colors, hair growth, and conditions: Extremity Color: [Left:Red] Hair Growth on Extremity: [Left:No] Temperature of Extremity: [Left:Warm] Capillary Refill: [Left:< 3 seconds] Dependent Rubor: [Left:No] Blanched when Elevated: [Left:No Yes] Toe Nail Assessment Left: Right: Thick: Yes Discolored: Yes Deformed: Yes Improper Length and Hygiene: No Electronic Signature(s) Signed: 08/30/2023 5:09:16 PM By: Shawn Stall RN, BSN Entered By: Shawn Stall on 08/30/2023 11:41:32 -------------------------------------------------------------------------------- Multi Wound Chart Details Patient Name: Date of Service: [name] 08/30/2023 2:30 PM Medical Record Number: [id number] Patient Account Number: [id number] Date of Birth/Sex: Treating RN: 05/05/1941 (82 y.o. M) Primary Care Price Lachapelle: Linus Galas Other Clinician: Referring  Magali Bray: Treating Zamariah Seaborn/Extender: Tina Griffiths, Glade Stanford in Treatment: 4 Vital Signs Height(in): 64 Pulse(bpm): 52 Weight(lbs): 189.4 Blood Pressure(mmHg): 195/97 Body Mass Index(BMI): 32.5 Temperature(F): 98.4 Respiratory Rate(breaths/min): 20 [1:Photos:] [N/A:N/A] Left, Distal, Lateral Lower Leg Left, Proximal, Lateral Lower Leg N/A Wound Location: Bump Gradually Appeared N/A Wounding Event: Venous Leg Ulcer Venous Leg Ulcer N/A Primary Etiology: Cataracts, Arrhythmia, Congestive Cataracts, Arrhythmia, Congestive N/A Comorbid History: Heart Failure, Hypertension, Gout Heart Failure, Hypertension, Gout 06/13/2023 08/22/2023 N/A Date Acquired: BRANSON, HANNULA ([id number]) [id number] Page 4 of 10 4 1  N/A Weeks of Treatment: Open Open N/A Wound Status: No No N/A Wound Recurrence: No Yes N/A Clustered Wound: N/A 2 N/A Clustered Quantity: 7x3.8x0.3 1.5x4x0.2 N/A Measurements L x [name] x D (cm) 20.892 4.712 N/A A (cm) : rea 6.267 0.942 N/A Volume (cm) : 75.70% 0.00% N/A % Reduction in Area: 81.70% -100.00% N/A % Reduction in Volume: Full Thickness Without Exposed Full Thickness Without Exposed N/A Classification: Support Structures Support Structures Medium Medium N/A Exudate Amount: Serosanguineous Serosanguineous N/A Exudate Type: red, brown red, brown N/A Exudate Color: Distinct, outline attached Distinct, outline attached N/A Wound Margin: Large (67-100%) Small (1-33%) N/A Granulation Amount: Red Red N/A Granulation Quality: Small (1-33%) Large (67-100%) N/A Necrotic Amount: Fat Layer (Subcutaneous Tissue): Yes Fat Layer (Subcutaneous Tissue): Yes N/A Exposed Structures: Fascia: No Fascia: No Tendon: No Tendon: No Muscle: No Muscle: No Joint: No Joint: No Bone: No Bone: No Medium (34-66%) Small (1-33%) N/A Epithelialization: Scarring: Yes Scarring: Yes N/A Periwound Skin Texture: Excoriation:  No Excoriation: No Induration: No Induration: No Callus: No Callus: No Crepitus: No Crepitus: No Rash: No Rash: No Maceration: No Maceration: No N/A Periwound Skin Moisture: Dry/Scaly: No Dry/Scaly: No Hemosiderin Staining: Yes Hemosiderin Staining: Yes N/A Periwound Skin Color: Atrophie Blanche: No Atrophie Blanche: No Cyanosis: No Cyanosis: No Ecchymosis: No Ecchymosis: No Erythema: No Erythema: No Mottled: No Mottled: No Pallor: No Pallor: No Rubor: No Rubor: No No Abnormality No Abnormality N/A Temperature: Compression Therapy Compression Therapy N/A Procedures Performed: Treatment Notes Wound #1 (Lower Leg) Wound Laterality: Left, Lateral, Distal Cleanser Soap and Water Discharge Instruction: May shower and wash wound with dial antibacterial soap and water prior to dressing change. Peri-Wound Care Topical Gentamicin Discharge Instruction: As directed by physician Mupirocin Ointment Discharge Instruction: Apply Mupirocin (Bactroban) as instructed Triamcinolone Discharge Instruction: Apply Triamcinolone as directed Primary Dressing Hydrofera Blue Ready Transfer Foam, 4x5 (in/in) Discharge Instruction: Apply to wound bed as instructed Secondary Dressing ABD Pad, 8x10 Discharge Instruction: Apply over primary dressing as directed. Woven Gauze Sponge, Non-Sterile 4x4 in Discharge Instruction: Apply over primary dressing as directed. Secured With Compression Wrap Urgo K2 Lite, (equivalent to a 3 layer) two  layer compression system, regular Discharge Instruction: Apply Urgo K2 Lite as directed (alternative to 3 layer compression). Compression Stockings [name], [name] ([id number]) [id number] Page 5 of 10 Add-Ons Wound #2 (Lower Leg) Wound Laterality: Left, Lateral, Proximal Cleanser Soap and Water Discharge Instruction: May shower and wash wound with dial antibacterial soap and water prior to dressing change. Peri-Wound  Care Topical Gentamicin Discharge Instruction: As directed by physician Mupirocin Ointment Discharge Instruction: Apply Mupirocin (Bactroban) as instructed Triamcinolone Discharge Instruction: Apply Triamcinolone as directed Primary Dressing Hydrofera Blue Ready Transfer Foam, 4x5 (in/in) Discharge Instruction: Apply to wound bed as instructed Secondary Dressing ABD Pad, 8x10 Discharge Instruction: Apply over primary dressing as directed. Woven Gauze Sponge, Non-Sterile 4x4 in Discharge Instruction: Apply over primary dressing as directed. Secured With Compression Wrap Urgo K2 Lite, (equivalent to a 3 layer) two layer compression system, regular Discharge Instruction: Apply Urgo K2 Lite as directed (alternative to 3 layer compression). Compression Stockings Add-Ons Electronic Signature(s) Signed: 08/31/2023 10:03:28 AM By: Baltazar Najjar MD Entered By: Baltazar Najjar on 08/30/2023 12:17:44 -------------------------------------------------------------------------------- Multi-Disciplinary Care Plan Details Patient Name: Date of Service: [name] 08/30/2023 2:30 PM Medical Record Number: [id number] Patient Account Number: [id number] Date of Birth/Sex: Treating RN: Apr 28, 1941 (82 y.o. [name] Primary Care Dare Sanger: Linus Galas Other Clinician: Referring Lezette Kitts: Treating Magnus Crescenzo/Extender: [name] in Treatment: 4 Active Inactive Wound/Skin Impairment Nursing Diagnoses: Impaired tissue integrity Goals: Patient/caregiver will verbalize understanding of skin care regimen Date Initiated: 07/28/2023 Target Resolution Date: 04/12/2024 Goal Status: Active [name], [name] ([id number]) [id number] Page 6 of 10 Interventions: Assess ulceration(s) every visit Treatment Activities: Skin care regimen initiated : 07/28/2023 Notes: Electronic Signature(s) Signed: 08/30/2023 5:09:16 PM By: Shawn Stall RN,  BSN Entered By: Shawn Stall on 08/30/2023 11:45:37 -------------------------------------------------------------------------------- Pain Assessment Details Patient Name: Date of Service: [name] 08/30/2023 2:30 PM Medical Record Number: [id number] Patient Account Number: [id number] Date of Birth/Sex: Treating RN: [birth year] (82 y.o. [name] Primary Care Ajanee Buren: Linus Galas Other Clinician: Referring Dravyn Severs: Treating Shea Swalley/Extender: Tina Griffiths, Glade Stanford in Treatment: 4 Active Problems Location of Pain Severity and Description of Pain Patient Has Paino No Site Locations Pain Management and Medication Current Pain Management: Electronic Signature(s) Signed: 08/30/2023 5:09:16 PM By: Shawn Stall RN, BSN Entered By: Shawn Stall on 08/30/2023 11:41:18 -------------------------------------------------------------------------------- Patient/Caregiver Education Details Patient Name: Date of Service: [name] 12/10/2024andnbsp2:30 PM Medical Record Number: [id number] Patient Account Number: [id number] Date of Birth/Gender: Treating RN: 1941-02-06 (82 y.o. [name] Primary Care Physician: Linus Galas Other Clinician: Referring Physician: Treating Physician/Extender: Nereo, Shobert, [place] ([id number]) [id number] Page 7 of 10 Weeks in Treatment: 4 Education Assessment Education Provided To: Patient Education Topics Provided Wound/Skin Impairment: Handouts: Caring for Your Ulcer Methods: Explain/Verbal Responses: Reinforcements needed Electronic Signature(s) Signed: 08/30/2023 5:09:16 PM By: Shawn Stall RN, BSN Entered By: Shawn Stall on 08/30/2023 11:46:23 -------------------------------------------------------------------------------- Wound Assessment Details Patient Name: Date of Service: [name] 08/30/2023 2:30 PM Medical Record Number: [id number] Patient  Account Number: [id number] Date of Birth/Sex: Treating RN: 29-Aug-1941 (82 y.o. [name] Primary Care Mariabella Nilsen: Linus Galas Other Clinician: Referring Lamonica Trueba: Treating Maeli Spacek/Extender: [name] in Treatment: 4 Wound Status Wound Number: 1 Primary Venous Leg Ulcer Etiology: Wound Location: Left, Distal, Lateral Lower Leg Wound Status: Open Wounding Event: Bump Comorbid Cataracts, Arrhythmia, Congestive Heart Failure, Date Acquired: 06/13/2023 History: Hypertension, Gout Weeks Of Treatment: 4 Clustered Wound: No Photos Wound  Measurements Length: (cm) 7 Width: (cm) 3.8 Depth: (cm) 0.3 Area: (cm) 20.892 Volume: (cm) 6.267 % Reduction in Area: 75.7% % Reduction in Volume: 81.7% Epithelialization: Medium (34-66%) Tunneling: No Undermining: No Wound Description Classification: Full Thickness Without Exposed Support Structures Wound Margin: Distinct, outline attached Exudate Amount: Medium Exudate Type: Serosanguineous Exudate Color: red, brown Foul Odor After Cleansing: No Slough/Fibrino Yes Wound Bed [name] ([id number]) [id number] Page 8 of 10 Granulation Amount: Large (67-100%) Exposed Structure Granulation Quality: Red Fascia Exposed: No Necrotic Amount: Small (1-33%) Fat Layer (Subcutaneous Tissue) Exposed: Yes Necrotic Quality: Adherent Slough Tendon Exposed: No Muscle Exposed: No Joint Exposed: No Bone Exposed: No Periwound Skin Texture Texture Color No Abnormalities Noted: No No Abnormalities Noted: No Callus: No Atrophie Blanche: No Crepitus: No Cyanosis: No Excoriation: No Ecchymosis: No Induration: No Erythema: No Rash: No Hemosiderin Staining: Yes Scarring: Yes Mottled: No Pallor: No Moisture Rubor: No No Abnormalities Noted: Yes Temperature / Pain Temperature: No Abnormality Treatment Notes Wound #1 (Lower Leg) Wound Laterality: Left, Lateral, Distal Cleanser Soap and  Water Discharge Instruction: May shower and wash wound with dial antibacterial soap and water prior to dressing change. Peri-Wound Care Topical Gentamicin Discharge Instruction: As directed by physician Mupirocin Ointment Discharge Instruction: Apply Mupirocin (Bactroban) as instructed Triamcinolone Discharge Instruction: Apply Triamcinolone as directed Primary Dressing Hydrofera Blue Ready Transfer Foam, 4x5 (in/in) Discharge Instruction: Apply to wound bed as instructed Secondary Dressing ABD Pad, 8x10 Discharge Instruction: Apply over primary dressing as directed. Woven Gauze Sponge, Non-Sterile 4x4 in Discharge Instruction: Apply over primary dressing as directed. Secured With Compression Wrap Urgo K2 Lite, (equivalent to a 3 layer) two layer compression system, regular Discharge Instruction: Apply Urgo K2 Lite as directed (alternative to 3 layer compression). Compression Stockings Add-Ons Electronic Signature(s) Signed: 08/30/2023 5:09:16 PM By: Shawn Stall RN, BSN Entered By: Shawn Stall on 08/30/2023 11:42:39 Wound Assessment Details -------------------------------------------------------------------------------- [name] ([id number]) [id number] Page 9 of 10 Patient Name: Date of Service: [name], [name] 08/30/2023 2:30 PM Medical Record Number: [id number] Patient Account Number: [id number] Date of Birth/Sex: Treating RN: 06-10-1941 (82 y.o. [name] Primary Care Verona Hartshorn: Linus Galas Other Clinician: Referring Zarianna Dicarlo: Treating Macayla Ekdahl/Extender: [name] in Treatment: 4 Wound Status Wound Number: 2 Primary Venous Leg Ulcer Etiology: Wound Location: Left, Proximal, Lateral Lower Leg Wound Status: Open Wounding Event: Gradually Appeared Comorbid Cataracts, Arrhythmia, Congestive Heart Failure, Date Acquired: 08/22/2023 History: Hypertension, Gout Weeks Of Treatment: 1 Clustered Wound:  Yes Photos Wound Measurements Length: (cm) 1.5 % Reduction in Area: 0% Width: (cm) 4 % Reduction in Volume: -100% Depth: (cm) 0.2 Epithelialization: Small (1-33%) Clustered Quantity: 2 Tunneling: No Area: (cm) 4.712 Undermining: No Volume: (cm) 0.942 Wound Description Classification: Full Thickness Without Exposed Support Structures Foul Odor After Cleansing: No Wound Margin: Distinct, outline attached Slough/Fibrino Yes Exudate Amount: Medium Exudate Type: Serosanguineous Exudate Color: red, brown Wound Bed Granulation Amount: Small (1-33%) Exposed Structure Granulation Quality: Red Fascia Exposed: No Necrotic Amount: Large (67-100%) Fat Layer (Subcutaneous Tissue) Exposed: Yes Necrotic Quality: Adherent Slough Tendon Exposed: No Muscle Exposed: No Joint Exposed: No Bone Exposed: No Periwound Skin Texture Texture Color No Abnormalities Noted: No No Abnormalities Noted: No Callus: No Atrophie Blanche: No Crepitus: No Cyanosis: No Excoriation: No Ecchymosis: No Induration: No Erythema: No Rash: No Hemosiderin Staining: Yes Scarring: Yes Mottled: No Pallor: No Moisture Rubor: No No Abnormalities Noted: Yes Temperature / Pain Temperature: No Abnormality Treatment Notes Wound #2 (Lower Leg) Wound Laterality: Left,  Lateral, Proximal Cleanser Soap and [name] [name], [name] ([id number]) [id number] Page 10 of 10 Discharge Instruction: May shower and wash wound with dial antibacterial soap and water prior to dressing change. Peri-Wound Care Topical Gentamicin Discharge Instruction: As directed by physician Mupirocin Ointment Discharge Instruction: Apply Mupirocin (Bactroban) as instructed Triamcinolone Discharge Instruction: Apply Triamcinolone as directed Primary Dressing Hydrofera Blue Ready Transfer Foam, 4x5 (in/in) Discharge Instruction: Apply to wound bed as instructed Secondary Dressing ABD Pad, 8x10 Discharge  Instruction: Apply over primary dressing as directed. Woven Gauze Sponge, Non-Sterile 4x4 in Discharge Instruction: Apply over primary dressing as directed. Secured With Compression Wrap Urgo K2 Lite, (equivalent to a 3 layer) two layer compression system, regular Discharge Instruction: Apply Urgo K2 Lite as directed (alternative to 3 layer compression). Compression Stockings Add-Ons Electronic Signature(s) Signed: 08/30/2023 5:09:16 PM By: Shawn Stall RN, BSN Entered By: Shawn Stall on 08/30/2023 11:42:57 -------------------------------------------------------------------------------- Vitals Details Patient Name: Date of Service: [name] 08/30/2023 2:30 PM Medical Record Number: [id number] Patient Account Number: [id number] Date of Birth/Sex: Treating RN: 11/01/1940 (82 y.o. [name] Primary Care Kaliann Coryell: Linus Galas Other Clinician: Referring Elijahjames Fuelling: Treating Janiece Scovill/Extender: [name] in Treatment: 4 Vital Signs Time Taken: 14:25 Temperature (F): 98.4 Height (in): 64 Pulse (bpm): 52 Weight (lbs): 189.4 Respiratory Rate (breaths/min): 20 Body Mass Index (BMI): 32.5 Blood Pressure (mmHg): 195/97 Reference Range: 80 - 120 mg / dl Notes BP elevated. Per wife normal at home. Patient denies any issues related to BP, no pain, no ringing in the ears. Electronic Signature(s) Signed: 08/30/2023 5:09:16 PM By: Shawn Stall RN, BSN Entered By: Shawn Stall on 08/30/2023 11:41:12

## 2023-08-31 NOTE — Progress Notes (Addendum)
Frank, [name] ([id number]) [id number] Page 1 of 7 Visit Report for 08/30/2023 HPI Details Patient Name: Date of Service: [name], [name] 08/30/2023 2:30 PM Medical Record Number: [id number] Patient Account Number: [id number] Date of Birth/Sex: Treating RN: 04-17-1941 (82 y.o. M) Primary Care Provider: Linus Galas Other Clinician: Referring Provider: Treating Provider/Extender: Tina Griffiths, Glade Stanford in Treatment: 4 History of Present Illness HPI Description: ADMISSION 07/28/2023. This is an 82 year old subsomewhat frail man who arrived accompanied by his wife and daughter. He has a large chronic ulcer on the left lateral lower leg. He was hospitalized from 10/30 through 07/23/2023 with a nonhealing wound in the left leg felt to have coexistent cellulitis. He was seen in the hospital by Dr. Lajoyce Corners. Discharged on Duricef and Flagyl. He is supposed to follow-up with Dr. Lajoyce Corners next week. He also saw a vein and vascular and did not feel he needed any intervention. I note that he had previously been seen earlier this year by Dr. Shepard [facility] at [facility]. Felt to have chronic venous hypertension and secondary lymphedema. At 1 point he was discovered to have maggots in this wound. Using Xeroform and T elfa. He has home health going out to see him and there is supposed to come tomorrow. Past medical history includes paroxysmal atrial fibrillation, hypertension, type 2 diabetes, chronic kidney disease, known PAD, heart failure with preserved ejection fraction He had arterial studies done on 07/21/2023. On the right his ABI was 0.97 TBI of 0.48 but he had triphasic and wave biphasic waveforms. On the left his TBI was 0.61. I do not think anything further was done. 11/13; large chronic venous insufficiency wound on the left lateral lower leg with 2 small superior satellite lesions. We used Santyl and Hydrofera Blue under and Urgo K2 light compression. He  has some degree of PAD which is the reason for the Urgo K2 lite's. He seems to have tolerated this surprisingly well. He has completed his antibiotics 11/19; large chronic venous insufficiency wound on the left lateral lower leg with 2 small satellite lesions superiorly. We are using Santyl and Hydrofera Blue under an Urgo K2 light compression. There is seems to been some confusion about the need for home health I think it would be really in the patient's best interest to have home health change this 1 time and we will do it 1 time in clinic per week 12/3; we are following this patient for a large chronic venous ulcers on the left lateral lower leg with 2 caudal satellite lesions. We have been using Santyl, TCA Hydrofera Blue under and Urgo K2 lite. We did a PCR culture on him 2 weeks ago that showed Pseudomonas and Enterococcus. I gave him a 10-day course of Levaquin which she completed. We had referred him for a Keystone prepared topical antibiotic combination however his wife complained about the $100 co- pay being unaffordable. Therefore that was not done. The wound is really completely unchanged today 12/10; large chronic venous ulcers on the left lateral leg with 2 caudal satellite lesions. Last week we put gent and mupirocin on this under silver alginate then he appears to be doing somewhat better. Better looking granulation. We are using Urgo K2 light compression Electronic Signature(s) Signed: 08/31/2023 10:03:28 AM By: Frank Najjar MD Entered By: [name] on 08/30/2023 12:18:30 -------------------------------------------------------------------------------- Physical Exam Details Patient Name: Date of Service: Frank Musa W. 08/30/2023 2:30 PM Medical Record Number: [id number] Patient Account Number: [id number] Date of Birth/Sex: Treating RN: 1940-10-11 (  82 y.o. M) Primary Care Provider: Linus Galas Other Clinician: Referring Provider: Treating Provider/Extender: [name] in Treatment: 4 Constitutional Patient is hypertensive.. Pulse regular and within target range for patient.Marland Kitchen Respirations regular, non-labored and within target range.. Temperature is normal and within the target range for the patient.Marland Kitchen Appears in no distress. JAGAN, VODA ([id number]) [id number] Page 2 of 7 Notes Wound exam; large wound on the left lateral calf however much better looking granulation this week. 2 caudal satellite lesions although they look similarly better. There is no evidence of surrounding infection his edema control is good no debridement is necessary Electronic Signature(s) Signed: 08/31/2023 10:03:28 AM By: Frank Najjar MD Entered By: [name] on 08/30/2023 12:19:26 -------------------------------------------------------------------------------- Physician Orders Details Patient Name: Date of Service: Frank Musa W. 08/30/2023 2:30 PM Medical Record Number: [id number] Patient Account Number: [id number] Date of Birth/Sex: Treating RN: 11/13/1940 (82 y.o. [name] Primary Care Provider: Linus Galas Other Clinician: Referring Provider: Treating Provider/Extender: [name] in Treatment: 4 The following information was scribed by: Shawn Stall The information was scribed for: [name] Verbal / Phone Orders: No Diagnosis Coding ICD-10 Coding Code Description I87.332 Chronic venous hypertension (idiopathic) with ulcer and inflammation of left lower extremity L97.828 Non-pressure chronic ulcer of other part of left lower leg with other specified severity E11.51 Type 2 diabetes mellitus with diabetic peripheral angiopathy without gangrene Follow-up Appointments ppointment in 1 week. - Dr. Leanord Hawking 09/08/23 at 2:30pm Return A ppointment in 2 weeks. - Dr. Leanord Hawking (front office to schedule) Return A Anesthetic (In clinic) Topical Lidocaine 5% applied to wound  bed Bathing/ Shower/ Hygiene May shower with protection but do not get wound dressing(s) wet. Protect dressing(s) with water repellant cover (for example, large plastic bag) or a cast cover and may then take shower. - Do not get the left leg wet. Keep the left leg dry until the next appointment. Edema Control - Orders / Instructions Elevate legs to the level of the heart or above for 30 minutes daily and/or when sitting for 3-4 times a day throughout the day. - As tolerated. Keep legs up when sitting/lying Avoid standing for long periods of time. Home Health New wound care orders this week; continue Home Health for wound care. May utilize formulary equivalent dressing for wound treatment orders unless otherwise specified. - TCA to periwound. Mupirocin and Gentamicin to wound bed to wound bed and Hydrafera Blue Ready over gent. and Mup. and then use cover wound -gauze,ABD pad and URGO K2 or 3 layer compression wraps. Other Home Health Orders/Instructions: - [facility] [phone] Wound Treatment Wound #1 - Lower Leg Wound Laterality: Left, Lateral, Distal Cleanser: Soap and Water 2 x Per Week/30 Days Discharge Instructions: May shower and wash wound with dial antibacterial soap and water prior to dressing change. Topical: Gentamicin 2 x Per Week/30 Days Discharge Instructions: As directed by physician Topical: Mupirocin Ointment 2 x Per Week/30 Days Discharge Instructions: Apply Mupirocin (Bactroban) as instructed Topical: Triamcinolone 2 x Per Week/30 Days Discharge Instructions: Apply Triamcinolone as directed Prim Dressing: Hydrofera Blue Ready Transfer Foam, 4x5 (in/in) 2 x Per Week/30 Days ary Discharge Instructions: Apply to wound bed as instructed Secondary Dressing: ABD Pad, 8x10 2 x Per Week/30 Days Discharge Instructions: Apply over primary dressing as directed. TAEGEN, MOLESKY ([id number]) [id number] Page 3 of 7 Secondary Dressing:  Woven Gauze Sponge, Non-Sterile 4x4 in 2 x Per Week/30 Days Discharge Instructions: Apply over primary dressing as  directed. Compression Wrap: Urgo K2 Lite, (equivalent to a 3 layer) two layer compression system, regular 2 x Per Week/30 Days Discharge Instructions: Apply Urgo K2 Lite as directed (alternative to 3 layer compression). Wound #2 - Lower Leg Wound Laterality: Left, Lateral, Proximal Cleanser: Soap and Water 2 x Per Week/30 Days Discharge Instructions: May shower and wash wound with dial antibacterial soap and water prior to dressing change. Topical: Gentamicin 2 x Per Week/30 Days Discharge Instructions: As directed by physician Topical: Mupirocin Ointment 2 x Per Week/30 Days Discharge Instructions: Apply Mupirocin (Bactroban) as instructed Topical: Triamcinolone 2 x Per Week/30 Days Discharge Instructions: Apply Triamcinolone as directed Prim Dressing: Hydrofera Blue Ready Transfer Foam, 4x5 (in/in) 2 x Per Week/30 Days ary Discharge Instructions: Apply to wound bed as instructed Secondary Dressing: ABD Pad, 8x10 2 x Per Week/30 Days Discharge Instructions: Apply over primary dressing as directed. Secondary Dressing: Woven Gauze Sponge, Non-Sterile 4x4 in 2 x Per Week/30 Days Discharge Instructions: Apply over primary dressing as directed. Compression Wrap: Urgo K2 Lite, (equivalent to a 3 layer) two layer compression system, regular 2 x Per Week/30 Days Discharge Instructions: Apply Urgo K2 Lite as directed (alternative to 3 layer compression). Patient Medications llergies: No Known Allergies A Notifications Medication Indication Start End 08/30/2023 gentamicin DOSE topical 0.1 % cream - cream topical once daily with dressing changes 08/30/2023 mupirocin DOSE topical 2 % ointment - ointment topical once daily with dressing changes Electronic Signature(s) Signed: 08/30/2023 3:23:55 PM By: Frank Najjar MD Entered By: [name] on 08/30/2023  12:23:55 -------------------------------------------------------------------------------- Problem List Details Patient Name: Date of Service: Frank Musa W. 08/30/2023 2:30 PM Medical Record Number: [id number] Patient Account Number: [id number] Date of Birth/Sex: Treating RN: 07-03-1941 (82 y.o. [name] Primary Care Provider: Linus Galas Other Clinician: Referring Provider: Treating Provider/Extender: [name] in Treatment: 4 Active Problems ICD-10 Encounter Code Description Active Date MDM Diagnosis I87.332 Chronic venous hypertension (idiopathic) with ulcer and inflammation of left 07/28/2023 No Yes lower extremity STALIN, HOULTON ([id number]) [id number] Page 4 of 7 [phone] Non-pressure chronic ulcer of other part of left lower leg with other specified 07/28/2023 No Yes severity E11.51 Type 2 diabetes mellitus with diabetic peripheral angiopathy without gangrene 07/28/2023 No Yes Inactive Problems ICD-10 Code Description Active Date Inactive Date L03.116 Cellulitis of left lower limb 07/28/2023 07/28/2023 Resolved Problems Electronic Signature(s) Signed: 08/31/2023 10:03:28 AM By: Frank Najjar MD Entered By: [name] on 08/30/2023 12:17:36 -------------------------------------------------------------------------------- Progress Note Details Patient Name: Date of Service: Frank Musa W. 08/30/2023 2:30 PM Medical Record Number: [id number] Patient Account Number: [id number] Date of Birth/Sex: Treating RN: 1941/09/13 (82 y.o. M) Primary Care Provider: Linus Galas Other Clinician: Referring Provider: Treating Provider/Extender: Tina Griffiths, Glade Stanford in Treatment: 4 Subjective History of Present Illness (HPI) ADMISSION 07/28/2023. This is an 82 year old subsomewhat frail man who arrived accompanied by his wife and daughter. He has a large chronic ulcer on the left lateral lower leg. He was  hospitalized from 10/30 through 07/23/2023 with a nonhealing wound in the left leg felt to have coexistent cellulitis. He was seen in the hospital by Dr. Lajoyce Corners. Discharged on Duricef and Flagyl. He is supposed to follow-up with Dr. Lajoyce Corners next week. He also saw a vein and vascular and did not feel he needed any intervention. I note that he had previously been seen earlier this year by Dr. Shepard [facility] at [facility]. Felt to have chronic venous hypertension and secondary lymphedema. At 1 point  he was discovered to have maggots in this wound. Using Xeroform and T elfa. He has home health going out to see him and there is supposed to come tomorrow. Past medical history includes paroxysmal atrial fibrillation, hypertension, type 2 diabetes, chronic kidney disease, known PAD, heart failure with preserved ejection fraction He had arterial studies done on 07/21/2023. On the right his ABI was 0.97 TBI of 0.48 but he had triphasic and wave biphasic waveforms. On the left his TBI was 0.61. I do not think anything further was done. 11/13; large chronic venous insufficiency wound on the left lateral lower leg with 2 small superior satellite lesions. We used Santyl and Hydrofera Blue under and Urgo K2 light compression. He has some degree of PAD which is the reason for the Urgo K2 lite's. He seems to have tolerated this surprisingly well. He has completed his antibiotics 11/19; large chronic venous insufficiency wound on the left lateral lower leg with 2 small satellite lesions superiorly. We are using Santyl and Hydrofera Blue under an Urgo K2 light compression. There is seems to been some confusion about the need for home health I think it would be really in the patient's best interest to have home health change this 1 time and we will do it 1 time in clinic per week 12/3; we are following this patient for a large chronic venous ulcers on the left lateral lower leg with 2 caudal satellite lesions. We have  been using Santyl, TCA Hydrofera Blue under and Urgo K2 lite. We did a PCR culture on him 2 weeks ago that showed Pseudomonas and Enterococcus. I gave him a 10-day course of Levaquin which she completed. We had referred him for a [facility] prepared topical antibiotic combination however his wife complained about the $100 co- pay being unaffordable. Therefore that was not done. The wound is really completely unchanged today 12/10; large chronic venous ulcers on the left lateral leg with 2 caudal satellite lesions. Last week we put gent and mupirocin on this under silver alginate then he appears to be doing somewhat better. Better looking granulation. We are using Urgo K2 light compression ELIYAHU, STANFORTH ([id number]) [id number] Page 5 of 7 Objective Constitutional Patient is hypertensive.. Pulse regular and within target range for patient.Marland Kitchen Respirations regular, non-labored and within target range.. Temperature is normal and within the target range for the patient.Marland Kitchen Appears in no distress. Vitals Time Taken: 2:25 PM, Height: 64 in, Weight: 189.4 lbs, BMI: 32.5, Temperature: 98.4 F, Pulse: 52 bpm, Respiratory Rate: 20 breaths/min, Blood Pressure: 195/97 mmHg. General Notes: BP elevated. Per wife normal at home. Patient denies any issues related to BP, no pain, no ringing in the ears. General Notes: Wound exam; large wound on the left lateral calf however much better looking granulation this week. 2 caudal satellite lesions although they look similarly better. There is no evidence of surrounding infection his edema control is good no debridement is necessary Integumentary (Hair, Skin) Wound #1 status is Open. Original cause of wound was Bump. The date acquired was: 06/13/2023. The wound has been in treatment 4 weeks. The wound is located on the Left,Distal,Lateral Lower Leg. The wound measures 7cm length x 3.8cm width x 0.3cm depth; 20.892cm^2 area and 6.267cm^3 volume. There  is Fat Layer (Subcutaneous Tissue) exposed. There is no tunneling or undermining noted. There is a medium amount of serosanguineous drainage noted. The wound margin is distinct with the outline attached to the wound base. There is large (67-100%) red granulation within the  wound bed. There is a small (1-33%) amount of necrotic tissue within the wound bed including Adherent Slough. The periwound skin appearance had no abnormalities noted for moisture. The periwound skin appearance exhibited: Scarring, Hemosiderin Staining. The periwound skin appearance did not exhibit: Callus, Crepitus, Excoriation, Induration, Rash, Atrophie Blanche, Cyanosis, Ecchymosis, Mottled, Pallor, Rubor, Erythema. Periwound temperature was noted as No Abnormality. Wound #2 status is Open. Original cause of wound was Gradually Appeared. The date acquired was: 08/22/2023. The wound has been in treatment 1 weeks. The wound is located on the Left,Proximal,Lateral Lower Leg. The wound measures 1.5cm length x 4cm width x 0.2cm depth; 4.712cm^2 area and 0.942cm^3 volume. There is Fat Layer (Subcutaneous Tissue) exposed. There is no tunneling or undermining noted. There is a medium amount of serosanguineous drainage noted. The wound margin is distinct with the outline attached to the wound base. There is small (1-33%) red granulation within the wound bed. There is a large (67-100%) amount of necrotic tissue within the wound bed including Adherent Slough. The periwound skin appearance had no abnormalities noted for moisture. The periwound skin appearance exhibited: Scarring, Hemosiderin Staining. The periwound skin appearance did not exhibit: Callus, Crepitus, Excoriation, Induration, Rash, Atrophie Blanche, Cyanosis, Ecchymosis, Mottled, Pallor, Rubor, Erythema. Periwound temperature was noted as No Abnormality. Assessment Active Problems ICD-10 Chronic venous hypertension (idiopathic) with ulcer and inflammation of left lower  extremity Non-pressure chronic ulcer of other part of left lower leg with other specified severity Type 2 diabetes mellitus with diabetic peripheral angiopathy without gangrene Procedures Wound #1 Pre-procedure diagnosis of Wound #1 is a Venous Leg Ulcer located on the Left,Distal,Lateral Lower Leg . There was a Double Layer Compression Therapy Procedure by Shawn Stall, RN. Post procedure Diagnosis Wound #1: Same as Pre-Procedure Wound #2 Pre-procedure diagnosis of Wound #2 is a Venous Leg Ulcer located on the Left,Proximal,Lateral Lower Leg . There was a Double Layer Compression Therapy Procedure by Shawn Stall, RN. Post procedure Diagnosis Wound #2: Same as Pre-Procedure Plan Follow-up Appointments: Return Appointment in 1 week. - Dr. Leanord Hawking 09/08/23 at 2:30pm Return Appointment in 2 weeks. - Dr. Leanord Hawking (front office to schedule) Anesthetic: (In clinic) Topical Lidocaine 5% applied to wound bed Bathing/ Shower/ Hygiene: May shower with protection but do not get wound dressing(s) wet. Protect dressing(s) with water repellant cover (for example, large plastic bag) or a cast cover and may then take shower. - Do not get the left leg wet. Keep the left leg dry until the next appointment. Edema Control - Orders / Instructions: Elevate legs to the level of the heart or above for 30 minutes daily and/or when sitting for 3-4 times a day throughout the day. - As tolerated. Keep legs up when sitting/lying Avoid standing for long periods of time. Home Health: New wound care orders this week; continue Home Health for wound care. May utilize formulary equivalent dressing for wound treatment orders unless MONTIQUE, BETZEN ([id number]) [id number] Page 6 of 7 otherwise specified. - TCA to periwound. Mupirocin and Gentamicin to wound bed to wound bed and Hydrafera Blue Ready over gent. and Mup. and then use cover wound -gauze,ABD pad and URGO K2 or 3 layer compression  wraps. Other Home Health Orders/Instructions: - [facility] fax:[phone] The following medication(s) was prescribed: gentamicin topical 0.1 % cream cream topical once daily with dressing changes starting 08/30/2023 mupirocin topical 2 % ointment ointment topical once daily with dressing changes starting 08/30/2023 WOUND #1: - Lower Leg Wound Laterality: Left, Lateral, Distal Cleanser: Soap and  Water 2 x Per Week/30 Days Discharge Instructions: May shower and wash wound with dial antibacterial soap and water prior to dressing change. Topical: Gentamicin 2 x Per Week/30 Days Discharge Instructions: As directed by physician Topical: Mupirocin Ointment 2 x Per Week/30 Days Discharge Instructions: Apply Mupirocin (Bactroban) as instructed Topical: Triamcinolone 2 x Per Week/30 Days Discharge Instructions: Apply Triamcinolone as directed Prim Dressing: Hydrofera Blue Ready Transfer Foam, 4x5 (in/in) 2 x Per Week/30 Days ary Discharge Instructions: Apply to wound bed as instructed Secondary Dressing: ABD Pad, 8x10 2 x Per Week/30 Days Discharge Instructions: Apply over primary dressing as directed. Secondary Dressing: Woven Gauze Sponge, Non-Sterile 4x4 in 2 x Per Week/30 Days Discharge Instructions: Apply over primary dressing as directed. Com pression Wrap: Urgo K2 Lite, (equivalent to a 3 layer) two layer compression system, regular 2 x Per Week/30 Days Discharge Instructions: Apply Urgo K2 Lite as directed (alternative to 3 layer compression). WOUND #2: - Lower Leg Wound Laterality: Left, Lateral, Proximal Cleanser: Soap and Water 2 x Per Week/30 Days Discharge Instructions: May shower and wash wound with dial antibacterial soap and water prior to dressing change. Topical: Gentamicin 2 x Per Week/30 Days Discharge Instructions: As directed by physician Topical: Mupirocin Ointment 2 x Per Week/30 Days Discharge Instructions: Apply Mupirocin (Bactroban) as instructed Topical:  Triamcinolone 2 x Per Week/30 Days Discharge Instructions: Apply Triamcinolone as directed Prim Dressing: Hydrofera Blue Ready Transfer Foam, 4x5 (in/in) 2 x Per Week/30 Days ary Discharge Instructions: Apply to wound bed as instructed Secondary Dressing: ABD Pad, 8x10 2 x Per Week/30 Days Discharge Instructions: Apply over primary dressing as directed. Secondary Dressing: Woven Gauze Sponge, Non-Sterile 4x4 in 2 x Per Week/30 Days Discharge Instructions: Apply over primary dressing as directed. Com pression Wrap: Urgo K2 Lite, (equivalent to a 3 layer) two layer compression system, regular 2 x Per Week/30 Days Discharge Instructions: Apply Urgo K2 Lite as directed (alternative to 3 layer compression). 1. Will put in Genta mupirocin to be used the dressing changes 2. There is no need for systemic antibiotics 3. Still silver alginate Urgo K2 lite's Electronic Signature(s) Signed: 09/01/2023 5:28:21 PM By: Shawn Stall RN, BSN Signed: 09/05/2023 4:04:15 PM By: Frank Najjar MD Previous Signature: 08/31/2023 10:03:28 AM Version By: Frank Najjar MD Entered By: Shawn Stall on 09/01/2023 14:26:40 -------------------------------------------------------------------------------- SuperBill Details Patient Name: Date of Service: [name]. 08/30/2023 Medical Record Number: [id number] Patient Account Number: [id number] Date of Birth/Sex: Treating RN: 11-21-1940 (82 y.o. [name] Primary Care Provider: Linus Galas Other Clinician: Referring Provider: Treating Provider/Extender: [name] in Treatment: 4 Diagnosis Coding ICD-10 Codes Code Description [phone] Chronic venous hypertension (idiopathic) with ulcer and inflammation of left lower extremity L97.828 Non-pressure chronic ulcer of other part of left lower leg with other specified severity E11.51 Type 2 diabetes mellitus with diabetic peripheral angiopathy without gangrene Facility  Procedures : LAWAN, HITCHINS CodeZAIDEN MCCULLEN ([id number] ( Description: ) [phone] Facility Use Only) [phone] - APPLY MULTLAY COMPRS LWR LT LEG Modifier: [id number]. [id number] Quantity: pdf Page 7 of 7 Physician Procedures : CPT4 Code Description Modifier [id number] - WC PHYS LEVEL 3 - EST PT ICD-10 Diagnosis Description I87.332 Chronic venous hypertension (idiopathic) with ulcer and inflammation of left lower extremity L97.828 Non-pressure chronic ulcer of other part  of left lower leg with other specified severity E11.51 Type 2 diabetes mellitus with diabetic peripheral angiopathy without gangrene Quantity: 1 Electronic Signature(s) Signed: 08/31/2023 10:03:28 AM By: Frank Najjar MD  Entered By: [name] on 08/30/2023 12:24:23

## 2023-09-05 ENCOUNTER — Ambulatory Visit (HOSPITAL_COMMUNITY)
Admission: RE | Admit: 2023-09-05 | Discharge: 2023-09-05 | Disposition: A | Discharge status: Home / Self Care | Payer: Medicare Other | Source: Ambulatory Visit | Attending: Surgery | Admitting: Surgery

## 2023-09-05 ENCOUNTER — Ambulatory Visit (INDEPENDENT_AMBULATORY_CARE_PROVIDER_SITE_OTHER)
Admission: RE | Admit: 2023-09-05 | Discharge: 2023-09-05 | Disposition: A | Discharge status: Home / Self Care | Payer: Medicare Other | Source: Ambulatory Visit | Attending: Surgery | Admitting: Surgery

## 2023-09-05 ENCOUNTER — Ambulatory Visit (INDEPENDENT_AMBULATORY_CARE_PROVIDER_SITE_OTHER): Payer: Medicare Other | Admitting: Physician Assistant

## 2023-09-05 ENCOUNTER — Encounter: Payer: Self-pay | Admitting: Physician Assistant

## 2023-09-05 VITALS — BP 187/99 | HR 56 | Temp 97.3°F | Resp 22 | Ht 64.0 in | Wt 190.7 lb

## 2023-09-05 DIAGNOSIS — I739 Peripheral vascular disease, unspecified: Secondary | ICD-10-CM

## 2023-09-05 DIAGNOSIS — S81802A Unspecified open wound, left lower leg, initial encounter: Secondary | ICD-10-CM

## 2023-09-05 LAB — VAS US ABI WITH/WO TBI
Left ABI: 1.06
Right ABI: 1.03
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
## 2023-09-08 ENCOUNTER — Encounter (HOSPITAL_BASED_OUTPATIENT_CLINIC_OR_DEPARTMENT_OTHER): Payer: Medicare Other | Admitting: Internal Medicine

## 2023-09-08 DIAGNOSIS — E11621 Type 2 diabetes mellitus with foot ulcer: Secondary | ICD-10-CM | POA: Diagnosis not present

## 2023-09-09 NOTE — Progress Notes (Signed)
SNOW, FLEEGER W ([id number]) [id number] Page 1 of 9 Visit Report for 09/08/2023 Arrival Information Details Patient Name: Date of Service: [name] 09/08/2023 2:30 PM Medical Record Number: [id number] Patient Account Number: [id number] Date of Birth/Sex: Treating RN: 03-Sep-1941 (82 y.o. [name] Primary Care Nela Bascom: Linus Galas Other Clinician: Referring Markeis Allman: Treating Arwin Bisceglia/Extender: [name] in Treatment: 6 Visit Information History Since Last Visit Added or deleted any medications: No Patient Arrived: Ambulatory Any new allergies or adverse reactions: No Arrival Time: 15:11 Had a fall or experienced change in No Accompanied By: wife activities of daily living that may affect Transfer Assistance: None risk of falls: Patient Identification Verified: Yes Signs or symptoms of abuse/neglect since last visito No Secondary Verification Process Completed: Yes Hospitalized since last visit: No Patient Requires Transmission-Based Precautions: No Implantable device outside of the clinic excluding No Patient Has Alerts: Yes cellular tissue based products placed in the center Patient Alerts: ABI R 0.97 since last visit: ABI L 0.61 Has Dressing in Place as Prescribed: Yes TBI 0.46 Has Compression in Place as Prescribed: Yes Pain Present Now: Yes Electronic Signature(s) Signed: 09/08/2023 5:51:07 PM By: Redmond Pulling RN, BSN Entered By: Redmond Pulling on 09/08/2023 15:12:32 -------------------------------------------------------------------------------- Compression Therapy Details Patient Name: Date of Service: Frank Musa W. 09/08/2023 2:30 PM Medical Record Number: [id number] Patient Account Number: [id number] Date of Birth/Sex: Treating RN: 02-Mar-1941 (82 y.o. [name] Primary Care Shaka Cardin: Linus Galas Other Clinician: Referring Demetris Capell: Treating Cova Knieriem/Extender: [name] in Treatment: 6 Compression Therapy Performed for Wound Assessment: Wound #1 Left,Distal,Lateral Lower Leg Performed By: Clinician Redmond Pulling, RN Compression Type: Three Layer Post Procedure Diagnosis Same as Pre-procedure Electronic Signature(s) Signed: 09/08/2023 5:51:07 PM By: Redmond Pulling RN, BSN Entered By: Redmond Pulling on 09/08/2023 15:24:47 [name] ([id number]) [id number] Page 2 of 9 -------------------------------------------------------------------------------- Compression Therapy Details Patient Name: Date of Service: [name], [name] 09/08/2023 2:30 PM Medical Record Number: [id number] Patient Account Number: [id number] Date of Birth/Sex: Treating RN: 23-Apr-1941 (82 y.o. [name] Primary Care Shalawn Wynder: Linus Galas Other Clinician: Referring Narcissa Melder: Treating Caileigh Canche/Extender: [name] in Treatment: 6 Compression Therapy Performed for Wound Assessment: Wound #2 Left,Proximal,Lateral Lower Leg Performed By: Clinician Redmond Pulling, RN Compression Type: Three Layer Post Procedure Diagnosis Same as Pre-procedure Electronic Signature(s) Signed: 09/08/2023 5:51:07 PM By: Redmond Pulling RN, BSN Entered By: Redmond Pulling on 09/08/2023 15:24:47 -------------------------------------------------------------------------------- Encounter Discharge Information Details Patient Name: Date of Service: Frank Musa W. 09/08/2023 2:30 PM Medical Record Number: [id number] Patient Account Number: [id number] Date of Birth/Sex: Treating RN: 08/23/1941 (82 y.o. [name] Primary Care Hayes Czaja: Linus Galas Other Clinician: Referring Tajha Sammarco: Treating Bonney Berres/Extender: [name] in Treatment: 6 Encounter Discharge Information Items Discharge Condition: Stable Ambulatory Status: Ambulatory Discharge Destination: Home Transportation: Private Auto Accompanied  By: wife Schedule Follow-up Appointment: Yes Clinical Summary of Care: Patient Declined Electronic Signature(s) Signed: 09/08/2023 5:51:07 PM By: Redmond Pulling RN, BSN Entered By: Redmond Pulling on 09/08/2023 17:12:24 -------------------------------------------------------------------------------- Lower Extremity Assessment Details Patient Name: Date of Service: Frank Musa W. 09/08/2023 2:30 PM Medical Record Number: [id number] Patient Account Number: [id number] Date of Birth/Sex: Treating RN: [birth year] (82 y.o. [name] Primary Care Brittin Belnap: Linus Galas Other Clinician: Referring Kelcey Wickstrom: Treating Taetum Flewellen/Extender: Tina Griffiths, Glade Stanford in Treatment: 6 Edema Assessment Assessed: [Left: No] [Right: No] Edema: [Left: Ye] [Right: s] Calf DOIL, BALFANZ ([id number]) [id number] Page 3 of 9  Left: Right: Point of Measurement: 30 cm From Medial Instep 41 cm Ankle Left: Right: Point of Measurement: 12 cm From Medial Instep 22.5 cm Vascular Assessment Pulses: Dorsalis Pedis Palpable: [Left:Yes] Extremity colors, hair growth, and conditions: Extremity Color: [Left:Red] Hair Growth on Extremity: [Left:No] Temperature of Extremity: [Left:Warm] Capillary Refill: [Left:< 3 seconds] Dependent Rubor: [Left:No Yes] Electronic Signature(s) Signed: 09/08/2023 5:51:07 PM By: Redmond Pulling RN, BSN Entered By: Redmond Pulling on 09/08/2023 15:12:55 -------------------------------------------------------------------------------- Multi Wound Chart Details Patient Name: Date of Service: Frank Musa W. 09/08/2023 2:30 PM Medical Record Number: [id number] Patient Account Number: [id number] Date of Birth/Sex: Treating RN: Mar 27, 1941 (82 y.o. M) Primary Care Sheria Rosello: Linus Galas Other Clinician: Referring Gaila Engebretsen: Treating Samiyyah Moffa/Extender: Tina Griffiths, Glade Stanford in Treatment: 6 Vital Signs Height(in): 64 Pulse(bpm):  52 Weight(lbs): 189.4 Blood Pressure(mmHg): 207/77 Body Mass Index(BMI): 32.5 Temperature(F): 98.3 Respiratory Rate(breaths/min): 18 [1:Photos:] [N/A:N/A] Left, Distal, Lateral Lower Leg Left, Proximal, Lateral Lower Leg N/A Wound Location: Bump Gradually Appeared N/A Wounding Event: Venous Leg Ulcer Venous Leg Ulcer N/A Primary Etiology: Cataracts, Arrhythmia, Congestive Cataracts, Arrhythmia, Congestive N/A Comorbid History: Heart Failure, Hypertension, Gout Heart Failure, Hypertension, Gout 06/13/2023 08/22/2023 N/A Date Acquired: 6 2 N/A Weeks of Treatment: Open Open N/A Wound Status: No No N/A Wound Recurrence: No Yes N/A Clustered Wound: N/A 2 N/A Clustered Quantity: 6.3x3.4x0.2 1.5x1.1x0.2 N/A Measurements L x W x D (cm) 16.823 1.296 N/A A (cm) : rea 3.365 0.259 N/A Volume (cm) : 80.40% 72.50% N/A % Reduction in Area: 90.20% 45.00% N/A % Reduction in VolumeRAYAAN, MONAGLE ([id number]) [id number] Page 4 of 9 Full Thickness Without Exposed Full Thickness Without Exposed N/A Classification: Support Structures Support Structures Medium Medium N/A Exudate Amount: Serosanguineous Serosanguineous N/A Exudate Type: red, brown red, brown N/A Exudate Color: Distinct, outline attached Distinct, outline attached N/A Wound Margin: Large (67-100%) Medium (34-66%) N/A Granulation Amount: Red Red N/A Granulation Quality: Small (1-33%) Medium (34-66%) N/A Necrotic Amount: Fat Layer (Subcutaneous Tissue): Yes Fat Layer (Subcutaneous Tissue): Yes N/A Exposed Structures: Fascia: No Fascia: No Tendon: No Tendon: No Muscle: No Muscle: No Joint: No Joint: No Bone: No Bone: No Medium (34-66%) Small (1-33%) N/A Epithelialization: Scarring: Yes Scarring: Yes N/A Periwound Skin Texture: Excoriation: No Excoriation: No Induration: No Induration: No Callus: No Callus: No Crepitus: No Crepitus: No Rash: No Rash: No Maceration:  No Maceration: No N/A Periwound Skin Moisture: Dry/Scaly: No Dry/Scaly: No Hemosiderin Staining: Yes Hemosiderin Staining: Yes N/A Periwound Skin Color: Atrophie Blanche: No Atrophie Blanche: No Cyanosis: No Cyanosis: No Ecchymosis: No Ecchymosis: No Erythema: No Erythema: No Mottled: No Mottled: No Pallor: No Pallor: No Rubor: No Rubor: No No Abnormality No Abnormality N/A Temperature: Compression Therapy Compression Therapy N/A Procedures Performed: Treatment Notes Electronic Signature(s) Signed: 09/08/2023 5:18:32 PM By: Baltazar Najjar MD Entered By: Baltazar Najjar on 09/08/2023 16:10:04 -------------------------------------------------------------------------------- Multi-Disciplinary Care Plan Details Patient Name: Date of Service: Frank Musa W. 09/08/2023 2:30 PM Medical Record Number: [id number] Patient Account Number: [id number] Date of Birth/Sex: Treating RN: [birth year] (82 y.o. [name] Primary Care Shawni Volkov: Linus Galas Other Clinician: Referring Aradhya Shellenbarger: Treating Ledon Weihe/Extender: Tina Griffiths, Glade Stanford in Treatment: 6 Active Inactive Wound/Skin Impairment Nursing Diagnoses: Impaired tissue integrity Goals: Patient/caregiver will verbalize understanding of skin care regimen Date Initiated: 07/28/2023 Target Resolution Date: 04/12/2024 Goal Status: Active Interventions: Assess ulceration(s) every visit Treatment Activities: Skin care regimen initiated : 07/28/2023 Notes: HILLIARD, BALOUGH ([id number]) [phone].pdf Page 5 of 9 Electronic Signature(s) Signed: 09/08/2023 5:51:07 PM By: Rubye Oaks,  Lyla Son RN, BSN Entered By: Redmond Pulling on 09/08/2023 15:22:47 -------------------------------------------------------------------------------- Pain Assessment Details Patient Name: Date of Service: JIE, MCCOIN. 09/08/2023 2:30 PM Medical Record Number: [id number] Patient Account Number: [id number] Date  of Birth/Sex: Treating RN: [birth year] (82 y.o. [name] Primary Care Hart Haas: Linus Galas Other Clinician: Referring Chayla Shands: Treating Mycah Formica/Extender: [name] in Treatment: 6 Active Problems Location of Pain Severity and Description of Pain Patient Has Paino Yes Site Locations Rate the pain. Current Pain Level: 2 Pain Management and Medication Current Pain Management: Electronic Signature(s) Signed: 09/08/2023 5:51:07 PM By: Redmond Pulling RN, BSN Entered By: Redmond Pulling on 09/08/2023 15:12:41 -------------------------------------------------------------------------------- Patient/Caregiver Education Details Patient Name: Date of Service: [name] 12/19/2024andnbsp2:30 PM Medical Record Number: [id number] Patient Account Number: [id number] Date of Birth/Gender: Treating RN: 1941-05-20 (82 y.o. [name] Primary Care Physician: Linus Galas Other Clinician: Referring Physician: Treating Physician/Extender: [name] in Treatment: 6 Education Assessment Education Provided To: Patient TREYDAN, MADEJA ([id number]) [id number] Page 6 of 9 Education Topics Provided Wound/Skin Impairment: Methods: Explain/Verbal Responses: State content correctly Electronic Signature(s) Signed: 09/08/2023 5:51:07 PM By: Redmond Pulling RN, BSN Entered By: Redmond Pulling on 09/08/2023 15:23:42 -------------------------------------------------------------------------------- Wound Assessment Details Patient Name: Date of Service: Frank Musa W. 09/08/2023 2:30 PM Medical Record Number: [id number] Patient Account Number: [id number] Date of Birth/Sex: Treating RN: [birth year] (82 y.o. [name] Primary Care Naava Janeway: Linus Galas Other Clinician: Referring Dael Howland: Treating Catarino Vold/Extender: [name] in Treatment: 6 Wound Status Wound Number: 1 Primary  Venous Leg Ulcer Etiology: Wound Location: Left, Distal, Lateral Lower Leg Wound Status: Open Wounding Event: Bump Comorbid Cataracts, Arrhythmia, Congestive Heart Failure, Date Acquired: 06/13/2023 History: Hypertension, Gout Weeks Of Treatment: 6 Clustered Wound: No Photos Wound Measurements Length: (cm) 6.3 Width: (cm) 3.4 Depth: (cm) 0.2 Area: (cm) 16.823 Volume: (cm) 3.365 % Reduction in Area: 80.4% % Reduction in Volume: 90.2% Epithelialization: Medium (34-66%) Tunneling: No Undermining: No Wound Description Classification: Full Thickness Without Exposed Support Structures Wound Margin: Distinct, outline attached Exudate Amount: Medium Exudate Type: Serosanguineous Exudate Color: red, brown Foul Odor After Cleansing: No Slough/Fibrino Yes Wound Bed Granulation Amount: Large (67-100%) Exposed Structure Granulation Quality: Red Fascia Exposed: No Necrotic Amount: Small (1-33%) Fat Layer (Subcutaneous Tissue) Exposed: Yes Necrotic Quality: Adherent Slough Tendon Exposed: No Muscle Exposed: No Joint Exposed: No Bone Exposed: No [name] ([id number]) [id number] Page 7 of 9 Periwound Skin Texture Texture Color No Abnormalities Noted: No No Abnormalities Noted: No Callus: No Atrophie Blanche: No Crepitus: No Cyanosis: No Excoriation: No Ecchymosis: No Induration: No Erythema: No Rash: No Hemosiderin Staining: Yes Scarring: Yes Mottled: No Pallor: No Moisture Rubor: No No Abnormalities Noted: Yes Temperature / Pain Temperature: No Abnormality Treatment Notes Wound #1 (Lower Leg) Wound Laterality: Left, Lateral, Distal Cleanser Soap and Water Discharge Instruction: May shower and wash wound with dial antibacterial soap and water prior to dressing change. Peri-Wound Care Topical Gentamicin Discharge Instruction: As directed by physician Mupirocin Ointment Discharge Instruction: Apply Mupirocin (Bactroban) as  instructed Triamcinolone Discharge Instruction: Apply Triamcinolone as directed Primary Dressing Hydrofera Blue Ready Transfer Foam, 4x5 (in/in) Discharge Instruction: Apply to wound bed as instructed Secondary Dressing ABD Pad, 8x10 Discharge Instruction: Apply over primary dressing as directed. Woven Gauze Sponge, Non-Sterile 4x4 in Discharge Instruction: Apply over primary dressing as directed. Secured With Compression Wrap Urgo K2 Lite, (equivalent to a 3 layer) two layer compression system, regular Discharge Instruction: Apply Jeryl Columbia  K2 Lite as directed (alternative to 3 layer compression). Compression Stockings Add-Ons Electronic Signature(s) Signed: 09/08/2023 5:51:07 PM By: Redmond Pulling RN, BSN Entered By: Redmond Pulling on 09/08/2023 15:14:56 -------------------------------------------------------------------------------- Wound Assessment Details Patient Name: Date of Service: Frank Musa W. 09/08/2023 2:30 PM Medical Record Number: [id number] Patient Account Number: [id number] Date of Birth/Sex: Treating RN: [birth year] (82 y.o. [name] Primary Care Burnette Valenti: Linus Galas Other Clinician: Referring Denijah Karrer: Treating Brianah Hopson/Extender: [name] in Treatment: 6 Wound Status GIANNCARLO, BECHEN ([id number]) [id number] Page 8 of 9 Wound Number: 2 Primary Venous Leg Ulcer Etiology: Wound Location: Left, Proximal, Lateral Lower Leg Wound Status: Open Wounding Event: Gradually Appeared Comorbid Cataracts, Arrhythmia, Congestive Heart Failure, Date Acquired: 08/22/2023 History: Hypertension, Gout Weeks Of Treatment: 2 Clustered Wound: Yes Photos Wound Measurements Length: (cm) Width: (cm) Depth: (cm) Clustered Quantity: Area: (cm) Volume: (cm) 1.5 % Reduction in Area: 72.5% 1.1 % Reduction in Volume: 45% 0.2 Epithelialization: Small (1-33%) 2 Tunneling: No 1.296 Undermining: No 0.259 Wound  Description Classification: Full Thickness Without Exposed Sup Wound Margin: Distinct, outline attached Exudate Amount: Medium Exudate Type: Serosanguineous Exudate Color: red, brown port Structures Foul Odor After Cleansing: No Slough/Fibrino Yes Wound Bed Granulation Amount: Medium (34-66%) Exposed Structure Granulation Quality: Red Fascia Exposed: No Necrotic Amount: Medium (34-66%) Fat Layer (Subcutaneous Tissue) Exposed: Yes Necrotic Quality: Adherent Slough Tendon Exposed: No Muscle Exposed: No Joint Exposed: No Bone Exposed: No Periwound Skin Texture Texture Color No Abnormalities Noted: No No Abnormalities Noted: No Callus: No Atrophie Blanche: No Crepitus: No Cyanosis: No Excoriation: No Ecchymosis: No Induration: No Erythema: No Rash: No Hemosiderin Staining: Yes Scarring: Yes Mottled: No Pallor: No Moisture Rubor: No No Abnormalities Noted: Yes Temperature / Pain Temperature: No Abnormality Treatment Notes Wound #2 (Lower Leg) Wound Laterality: Left, Lateral, Proximal Cleanser Soap and Water Discharge Instruction: May shower and wash wound with dial antibacterial soap and water prior to dressing change. Peri-Wound Care Topical Gentamicin Discharge Instruction: As directed by physician Mupirocin Ointment MOMEN, GROSSO ([id number]) [id number] Page 9 of 9 Discharge Instruction: Apply Mupirocin (Bactroban) as instructed Triamcinolone Discharge Instruction: Apply Triamcinolone as directed Primary Dressing Hydrofera Blue Ready Transfer Foam, 4x5 (in/in) Discharge Instruction: Apply to wound bed as instructed Secondary Dressing ABD Pad, 8x10 Discharge Instruction: Apply over primary dressing as directed. Woven Gauze Sponge, Non-Sterile 4x4 in Discharge Instruction: Apply over primary dressing as directed. Secured With Compression Wrap Urgo K2 Lite, (equivalent to a 3 layer) two layer compression system,  regular Discharge Instruction: Apply Urgo K2 Lite as directed (alternative to 3 layer compression). Compression Stockings Add-Ons Electronic Signature(s) Signed: 09/08/2023 5:51:07 PM By: Redmond Pulling RN, BSN Entered By: Redmond Pulling on 09/08/2023 15:15:46 -------------------------------------------------------------------------------- Vitals Details Patient Name: Date of Service: Frank Musa W. 09/08/2023 2:30 PM Medical Record Number: [id number] Patient Account Number: [id number] Date of Birth/Sex: Treating RN: 1940-09-22 (82 y.o. [name] Primary Care Eleanor Gatliff: Linus Galas Other Clinician: Referring Chez Bulnes: Treating Cosme Jacob/Extender: [name] in Treatment: 6 Vital Signs Time Taken: 15:00 Temperature (F): 98.3 Height (in): 64 Pulse (bpm): 52 Weight (lbs): 189.4 Respiratory Rate (breaths/min): 18 Body Mass Index (BMI): 32.5 Blood Pressure (mmHg): 207/77 Reference Range: 80 - 120 mg / dl Electronic Signature(s) Signed: 09/08/2023 5:51:07 PM By: Redmond Pulling RN, BSN Entered By: Redmond Pulling on 09/08/2023 15:11:50

## 2023-09-13 NOTE — Progress Notes (Signed)
BURLE, KIVETT ([id number]) [id number] Page 1 of 7 Visit Report for 09/08/2023 HPI Details Patient Name: Date of Service: [name], [name] 09/08/2023 2:30 PM Medical Record Number: [id number] Patient Account Number: [id number] Date of Birth/Sex: Treating RN: [birth year] (82 y.o. M) Primary Care Provider: Linus [name] Other Clinician: Referring Provider: Treating Provider/Extender: [name], [name] in Treatment: 6 History of Present Illness HPI Description: ADMISSION 07/28/2023. This is an 82 year old subsomewhat frail man who arrived accompanied by his wife and daughter. He has a large chronic ulcer on the left lateral lower leg. He was hospitalized from 10/30 through 07/23/2023 with a nonhealing wound in the left leg felt to have coexistent cellulitis. He was seen in the hospital by [name] Discharged on Duricef and Flagyl. He is supposed to follow-up with [name] next week. He also saw a vein and vascular and did not feel he needed any intervention. I note that he had previously been seen earlier this year by [name] at [facility]. Felt to have chronic venous hypertension and secondary lymphedema. At 1 point he was discovered to have maggots in this wound. Using Xeroform and T elfa. He has home health going out to see him and there is supposed to come tomorrow. Past medical history includes paroxysmal atrial fibrillation, hypertension, type 2 diabetes, chronic kidney disease, known PAD, heart failure with preserved ejection fraction He had arterial studies done on 07/21/2023. On the right his ABI was 0.97 TBI of 0.48 but he had triphasic and wave biphasic waveforms. On the left his TBI was 0.61. I do not think anything further was done. 11/13; large chronic venous insufficiency wound on the left lateral lower leg with 2 small superior satellite lesions. We used Santyl and Hydrofera Blue under and Urgo K2 light compression. He  has some degree of PAD which is the reason for the Urgo K2 lite's. He seems to have tolerated this surprisingly well. He has completed his antibiotics 11/19; large chronic venous insufficiency wound on the left lateral lower leg with 2 small satellite lesions superiorly. We are using Santyl and Hydrofera Blue under an Urgo K2 light compression. There is seems to been some confusion about the need for home health I think it would be really in the patient's best interest to have home health change this 1 time and we will do it 1 time in clinic per week 12/3; we are following this patient for a large chronic venous ulcers on the left lateral lower leg with 2 caudal satellite lesions. We have been using Santyl, TCA Hydrofera Blue under and Urgo K2 lite. We did a PCR culture on him 2 weeks ago that showed Pseudomonas and Enterococcus. I gave him a 10-day course of Levaquin which she completed. We had referred him for a [facility] prepared topical antibiotic combination however his wife complained about the $100 co- pay being unaffordable. Therefore that was not done. The wound is really completely unchanged today 12/10; large chronic venous ulcers on the left lateral leg with 2 caudal satellite lesions. Last week we put gent and mupirocin on this under silver alginate then he appears to be doing somewhat better. Better looking granulation. We are using Urgo K2 light compression 12/19; the area on the left lateral lower leg which is a large wound with 2 caudal satellite lesions seems to be contracting including the satellite lesions. We have been putting gent and mupirocin on the surface with silver alginate as the primary dressing under Urgo K2  light compression. The latter seems to be controlling his swelling Electronic Signature(s) Signed: 09/08/2023 5:18:32 PM By: Frank Najjar MD Entered By: [name] on 09/08/2023  13:10:52 -------------------------------------------------------------------------------- Physical Exam Details Patient Name: Date of Service: [name] 09/08/2023 2:30 PM Medical Record Number: [id number] Patient Account Number: [id number] Date of Birth/Sex: Treating RN: 04/06/1941 (82 y.o. M) Primary Care Provider: Linus [name] Other Clinician: Referring Provider: Treating Provider/Extender: [name] in Treatment: [address] [name], [name] ([id number]) [id number] Page 2 of 7 Patient is hypertensive.. Pulse regular and within target range for patient.Marland Kitchen Respirations regular, non-labored and within target range.. Temperature is normal and within the target range for the patient.Marland Kitchen Appears in no distress. Notes Wound exam; large wound on the left lateral calf appears to be contracting. Granulation looks healthy I did not feel the mechanical debridement was necessary. The 2 small satellite areas above the wound also look like they are contracting. Edema control is good Electronic Signature(s) Signed: 09/08/2023 5:18:32 PM By: Frank Najjar MD Entered By: [name] on 09/08/2023 13:13:51 -------------------------------------------------------------------------------- Physician Orders Details Patient Name: Date of Service: [name] 09/08/2023 2:30 PM Medical Record Number: [id number] Patient Account Number: [id number] Date of Birth/Sex: Treating RN: 11-12-1940 (82 y.o. [name] Primary Care Provider: Linus [name] Other Clinician: Referring Provider: Treating Provider/Extender: [name] in Treatment: 6 Verbal / Phone Orders: No Diagnosis Coding ICD-10 Coding Code Description [phone] Chronic venous hypertension (idiopathic) with ulcer and inflammation of left lower extremity L97.828 Non-pressure chronic ulcer of other part of left lower leg with other specified  severity E11.51 Type 2 diabetes mellitus with diabetic peripheral angiopathy without gangrene Follow-up Appointments ppointment in 2 weeks. - Dr. Lady Gary 12/131/24 at 11:00 Return A Return appointment in 3 weeks. - Dr Mikey Bussing *** Please schedule*** Anesthetic (In clinic) Topical Lidocaine 5% applied to wound bed Bathing/ Shower/ Hygiene May shower with protection but do not get wound dressing(s) wet. Protect dressing(s) with water repellant cover (for example, large plastic bag) or a cast cover and may then take shower. - Do not get the left leg wet. Keep the left leg dry until the next appointment. Edema Control - Orders / Instructions Elevate legs to the level of the heart or above for 30 minutes daily and/or when sitting for 3-4 times a day throughout the day. - As tolerated. Keep legs up when sitting/lying Avoid standing for long periods of time. Home Health New wound care orders this week; continue Home Health for wound care. May utilize formulary equivalent dressing for wound treatment orders unless otherwise specified. - TCA to periwound. Mupirocin and Gentamicin to wound bed to wound bed and Hydrafera Blue Ready over gent. and Mup. and then use cover wound -gauze,ABD pad and URGO K2 or 3 layer compression wraps. Other Home Health Orders/Instructions: - [facility] fax:[phone] Wound Treatment Wound #1 - Lower Leg Wound Laterality: Left, Lateral, Distal Cleanser: Soap and Water 2 x Per Week/30 Days Discharge Instructions: May shower and wash wound with dial antibacterial soap and water prior to dressing change. Topical: Gentamicin 2 x Per Week/30 Days Discharge Instructions: As directed by physician Topical: Mupirocin Ointment 2 x Per Week/30 Days Discharge Instructions: Apply Mupirocin (Bactroban) as instructed Topical: Triamcinolone 2 x Per Week/30 Days Discharge Instructions: Apply Triamcinolone as directed Prim Dressing: Hydrofera Blue Ready Transfer Foam, 4x5  (in/in) 2 x Per Week/30 Days ary Discharge Instructions: Apply to wound bed as instructed [name], [name] ([id number]) [id number]  Page 3 of 7 Secondary Dressing: ABD Pad, 8x10 2 x Per Week/30 Days Discharge Instructions: Apply over primary dressing as directed. Secondary Dressing: Woven Gauze Sponge, Non-Sterile 4x4 in 2 x Per Week/30 Days Discharge Instructions: Apply over primary dressing as directed. Compression Wrap: Urgo K2 Lite, (equivalent to a 3 layer) two layer compression system, regular 2 x Per Week/30 Days Discharge Instructions: Apply Urgo K2 Lite as directed (alternative to 3 layer compression). Wound #2 - Lower Leg Wound Laterality: Left, Lateral, Proximal Cleanser: Soap and Water 2 x Per Week/30 Days Discharge Instructions: May shower and wash wound with dial antibacterial soap and water prior to dressing change. Topical: Gentamicin 2 x Per Week/30 Days Discharge Instructions: As directed by physician Topical: Mupirocin Ointment 2 x Per Week/30 Days Discharge Instructions: Apply Mupirocin (Bactroban) as instructed Topical: Triamcinolone 2 x Per Week/30 Days Discharge Instructions: Apply Triamcinolone as directed Prim Dressing: Hydrofera Blue Ready Transfer Foam, 4x5 (in/in) 2 x Per Week/30 Days ary Discharge Instructions: Apply to wound bed as instructed Secondary Dressing: ABD Pad, 8x10 2 x Per Week/30 Days Discharge Instructions: Apply over primary dressing as directed. Secondary Dressing: Woven Gauze Sponge, Non-Sterile 4x4 in 2 x Per Week/30 Days Discharge Instructions: Apply over primary dressing as directed. Compression Wrap: Urgo K2 Lite, (equivalent to a 3 layer) two layer compression system, regular 2 x Per Week/30 Days Discharge Instructions: Apply Urgo K2 Lite as directed (alternative to 3 layer compression). Electronic Signature(s) Signed: 09/08/2023 5:51:07 PM By: Redmond Pulling RN, BSN Signed: 09/13/2023 11:51:18 AM By: Frank Najjar MD Previous Signature: 09/08/2023 5:18:32 PM Version By: Frank Najjar MD Entered By: Redmond Pulling on 09/08/2023 14:40:36 -------------------------------------------------------------------------------- Problem List Details Patient Name: Date of Service: [name] 09/08/2023 2:30 PM Medical Record Number: [id number] Patient Account Number: [id number] Date of Birth/Sex: Treating RN: 20-Jun-1941 (82 y.o. M) Primary Care Provider: Linus [name] Other Clinician: Referring Provider: Treating Provider/Extender: [name] in Treatment: 6 Active Problems ICD-10 Encounter Code Description Active Date MDM Diagnosis I87.332 Chronic venous hypertension (idiopathic) with ulcer and inflammation of left 07/28/2023 No Yes lower extremity L97.828 Non-pressure chronic ulcer of other part of left lower leg with other specified 07/28/2023 No Yes severity E11.51 Type 2 diabetes mellitus with diabetic peripheral angiopathy without gangrene 07/28/2023 No Yes [name], [name] ([id number]) [id number] Page 4 of 7 Inactive Problems ICD-10 Code Description Active Date Inactive Date L03.116 Cellulitis of left lower limb 07/28/2023 07/28/2023 Resolved Problems Electronic Signature(s) Signed: 09/08/2023 5:18:32 PM By: Frank Najjar MD Entered By: [name] on 09/08/2023 13:09:57 -------------------------------------------------------------------------------- Progress Note Details Patient Name: Date of Service: [name] 09/08/2023 2:30 PM Medical Record Number: [id number] Patient Account Number: [id number] Date of Birth/Sex: Treating RN: May 01, 1941 (82 y.o. M) Primary Care Provider: Linus [name] Other Clinician: Referring Provider: Treating Provider/Extender: [name], [name] in Treatment: 6 Subjective History of Present Illness (HPI) ADMISSION 07/28/2023. This is an 82 year old subsomewhat frail man who  arrived accompanied by his wife and daughter. He has a large chronic ulcer on the left lateral lower leg. He was hospitalized from 10/30 through 07/23/2023 with a nonhealing wound in the left leg felt to have coexistent cellulitis. He was seen in the hospital by [name] Discharged on Duricef and Flagyl. He is supposed to follow-up with [name] next week. He also saw a vein and vascular and did not feel he needed any intervention. I note that he had previously been seen earlier this year by Dr.  Shepard [name] at [facility]. Felt to have chronic venous hypertension and secondary lymphedema. At 1 point he was discovered to have maggots in this wound. Using Xeroform and T elfa. He has home health going out to see him and there is supposed to come tomorrow. Past medical history includes paroxysmal atrial fibrillation, hypertension, type 2 diabetes, chronic kidney disease, known PAD, heart failure with preserved ejection fraction He had arterial studies done on 07/21/2023. On the right his ABI was 0.97 TBI of 0.48 but he had triphasic and wave biphasic waveforms. On the left his TBI was 0.61. I do not think anything further was done. 11/13; large chronic venous insufficiency wound on the left lateral lower leg with 2 small superior satellite lesions. We used Santyl and Hydrofera Blue under and Urgo K2 light compression. He has some degree of PAD which is the reason for the Urgo K2 lite's. He seems to have tolerated this surprisingly well. He has completed his antibiotics 11/19; large chronic venous insufficiency wound on the left lateral lower leg with 2 small satellite lesions superiorly. We are using Santyl and Hydrofera Blue under an Urgo K2 light compression. There is seems to been some confusion about the need for home health I think it would be really in the patient's best interest to have home health change this 1 time and we will do it 1 time in clinic per week 12/3; we are following this  patient for a large chronic venous ulcers on the left lateral lower leg with 2 caudal satellite lesions. We have been using Santyl, TCA Hydrofera Blue under and Urgo K2 lite. We did a PCR culture on him 2 weeks ago that showed Pseudomonas and Enterococcus. I gave him a 10-day course of Levaquin which she completed. We had referred him for a [facility] prepared topical antibiotic combination however his wife complained about the $100 co- pay being unaffordable. Therefore that was not done. The wound is really completely unchanged today 12/10; large chronic venous ulcers on the left lateral leg with 2 caudal satellite lesions. Last week we put gent and mupirocin on this under silver alginate then he appears to be doing somewhat better. Better looking granulation. We are using Urgo K2 light compression 12/19; the area on the left lateral lower leg which is a large wound with 2 caudal satellite lesions seems to be contracting including the satellite lesions. We have been putting gent and mupirocin on the surface with silver alginate as the primary dressing under Urgo K2 light compression. The latter seems to be controlling his swelling Objective [name], [name] ([id number]) [id number] Page 5 of 7 Constitutional Patient is hypertensive.. Pulse regular and within target range for patient.Marland Kitchen Respirations regular, non-labored and within target range.. Temperature is normal and within the target range for the patient.Marland Kitchen Appears in no distress. Vitals Time Taken: 3:00 PM, Height: 64 in, Weight: 189.4 lbs, BMI: 32.5, Temperature: 98.3 F, Pulse: 52 bpm, Respiratory Rate: 18 breaths/min, Blood Pressure: 207/77 mmHg. [name] Notes: Wound exam; large wound on the left lateral calf appears to be contracting. Granulation looks healthy I did not feel the mechanical debridement was necessary. The 2 small satellite areas above the wound also look like they are contracting. Edema control is  good Integumentary (Hair, Skin) Wound #1 status is Open. Original cause of wound was Bump. The date acquired was: 06/13/2023. The wound has been in treatment 6 weeks. The wound is located on the Left,Distal,Lateral Lower Leg. The wound measures 6.3cm length x 3.4cm  width x 0.2cm depth; 16.823cm^2 area and 3.365cm^3 volume. There is Fat Layer (Subcutaneous Tissue) exposed. There is no tunneling or undermining noted. There is a medium amount of serosanguineous drainage noted. The wound margin is distinct with the outline attached to the wound base. There is large (67-100%) red granulation within the wound bed. There is a small (1-33%) amount of necrotic tissue within the wound bed including Adherent Slough. The periwound skin appearance had no abnormalities noted for moisture. The periwound skin appearance exhibited: Scarring, Hemosiderin Staining. The periwound skin appearance did not exhibit: Callus, Crepitus, Excoriation, Induration, Rash, Atrophie Blanche, Cyanosis, Ecchymosis, Mottled, Pallor, Rubor, Erythema. Periwound temperature was noted as No Abnormality. Wound #2 status is Open. Original cause of wound was Gradually Appeared. The date acquired was: 08/22/2023. The wound has been in treatment 2 weeks. The wound is located on the Left,Proximal,Lateral Lower Leg. The wound measures 1.5cm length x 1.1cm width x 0.2cm depth; 1.296cm^2 area and 0.259cm^3 volume. There is Fat Layer (Subcutaneous Tissue) exposed. There is no tunneling or undermining noted. There is a medium amount of serosanguineous drainage noted. The wound margin is distinct with the outline attached to the wound base. There is medium (34-66%) red granulation within the wound bed. There is a medium (34-66%) amount of necrotic tissue within the wound bed including Adherent Slough. The periwound skin appearance had no abnormalities noted for moisture. The periwound skin appearance exhibited: Scarring, Hemosiderin Staining. The periwound  skin appearance did not exhibit: Callus, Crepitus, Excoriation, Induration, Rash, Atrophie Blanche, Cyanosis, Ecchymosis, Mottled, Pallor, Rubor, Erythema. Periwound temperature was noted as No Abnormality. Assessment Active Problems ICD-10 Chronic venous hypertension (idiopathic) with ulcer and inflammation of left lower extremity Non-pressure chronic ulcer of other part of left lower leg with other specified severity Type 2 diabetes mellitus with diabetic peripheral angiopathy without gangrene Procedures Wound #1 Pre-procedure diagnosis of Wound #1 is a Venous Leg Ulcer located on the Left,Distal,Lateral Lower Leg . There was a Three Layer Compression Therapy Procedure by Redmond Pulling, RN. Post procedure Diagnosis Wound #1: Same as Pre-Procedure Wound #2 Pre-procedure diagnosis of Wound #2 is a Venous Leg Ulcer located on the Left,Proximal,Lateral Lower Leg . There was a Three Layer Compression Therapy Procedure by Redmond Pulling, RN. Post procedure Diagnosis Wound #2: Same as Pre-Procedure Plan Follow-up Appointments: Return Appointment in 2 weeks. - Dr. Lady Gary 12/131/24 at 11:00 Return appointment in 3 weeks. - Dr Mikey Bussing *** Please schedule*** Anesthetic: (In clinic) Topical Lidocaine 5% applied to wound bed Bathing/ Shower/ Hygiene: May shower with protection but do not get wound dressing(s) wet. Protect dressing(s) with water repellant cover (for example, large plastic bag) or a cast cover and may then take shower. - Do not get the left leg wet. Keep the left leg dry until the next appointment. Edema Control - Orders / Instructions: Elevate legs to the level of the heart or above for 30 minutes daily and/or when sitting for 3-4 times a day throughout the day. - As tolerated. Keep legs up when sitting/lying Avoid standing for long periods of time. Home Health: New wound care orders this week; continue Home Health for wound care. May utilize formulary equivalent dressing for  wound treatment orders unless otherwise specified. - TCA to periwound. Mupirocin and Gentamicin to wound bed to wound bed and Hydrafera Blue Ready over gent. and Mup. and then use cover wound -gauze,ABD pad and URGO K2 or 3 layer compression wraps. Other Home Health Orders/Instructions: - [facility] [phone] WOUND #1: -  Lower Leg Wound Laterality: Left, Lateral, Distal Cleanser: Soap and Water 2 x Per Week/30 Days Discharge Instructions: May shower and wash wound with dial antibacterial soap and water prior to dressing change. [name], [name] ([id number]) [id number] Page 6 of 7 Topical: Gentamicin 2 x Per Week/30 Days Discharge Instructions: As directed by physician Topical: Mupirocin Ointment 2 x Per Week/30 Days Discharge Instructions: Apply Mupirocin (Bactroban) as instructed Topical: Triamcinolone 2 x Per Week/30 Days Discharge Instructions: Apply Triamcinolone as directed Prim Dressing: Hydrofera Blue Ready Transfer Foam, 4x5 (in/in) 2 x Per Week/30 Days ary Discharge Instructions: Apply to wound bed as instructed Secondary Dressing: ABD Pad, 8x10 2 x Per Week/30 Days Discharge Instructions: Apply over primary dressing as directed. Secondary Dressing: Woven Gauze Sponge, Non-Sterile 4x4 in 2 x Per Week/30 Days Discharge Instructions: Apply over primary dressing as directed. Com pression Wrap: Urgo K2 Lite, (equivalent to a 3 layer) two layer compression system, regular 2 x Per Week/30 Days Discharge Instructions: Apply Urgo K2 Lite as directed (alternative to 3 layer compression). WOUND #2: - Lower Leg Wound Laterality: Left, Lateral, Proximal Cleanser: Soap and Water 2 x Per Week/30 Days Discharge Instructions: May shower and wash wound with dial antibacterial soap and water prior to dressing change. Topical: Gentamicin 2 x Per Week/30 Days Discharge Instructions: As directed by physician Topical: Mupirocin Ointment 2 x Per Week/30  Days Discharge Instructions: Apply Mupirocin (Bactroban) as instructed Topical: Triamcinolone 2 x Per Week/30 Days Discharge Instructions: Apply Triamcinolone as directed Prim Dressing: Hydrofera Blue Ready Transfer Foam, 4x5 (in/in) 2 x Per Week/30 Days ary Discharge Instructions: Apply to wound bed as instructed Secondary Dressing: ABD Pad, 8x10 2 x Per Week/30 Days Discharge Instructions: Apply over primary dressing as directed. Secondary Dressing: Woven Gauze Sponge, Non-Sterile 4x4 in 2 x Per Week/30 Days Discharge Instructions: Apply over primary dressing as directed. Com pression Wrap: Urgo K2 Lite, (equivalent to a 3 layer) two layer compression system, regular 2 x Per Week/30 Days Discharge Instructions: Apply Urgo K2 Lite as directed (alternative to 3 layer compression). We are still using topical antibiotics under Hydrofera Blue 2. Urgo take K2 light compression 3. We seem to be making slow but steady progress in this large venous wound. Electronic Signature(s) Signed: 09/13/2023 7:48:39 AM By: Shawn Stall RN, BSN Signed: 09/13/2023 11:51:18 AM By: Frank Najjar MD Previous Signature: 09/08/2023 5:18:32 PM Version By: Frank Najjar MD Entered By: Shawn Stall on 09/13/2023 04:47:01 -------------------------------------------------------------------------------- SuperBill Details Patient Name: Date of Service: [name] 09/08/2023 Medical Record Number: [id number] Patient Account Number: [id number] Date of Birth/Sex: Treating RN: 09-Oct-1940 (82 y.o. M) Primary Care Provider: Linus [name] Other Clinician: Referring Provider: Treating Provider/Extender: [name], [name] in Treatment: 6 Diagnosis Coding ICD-10 Codes Code Description [phone] Chronic venous hypertension (idiopathic) with ulcer and inflammation of left lower extremity L97.828 Non-pressure chronic ulcer of other part of left lower leg with other specified severity E11.51 Type 2  diabetes mellitus with diabetic peripheral angiopathy without gangrene Facility Procedures : CPT4 Code: [id number] Description: (Facility Use Only) [id number] - APPLY MULTLAY COMPRS LWR LT LEG Modifier: Quantity: 1 Physician Procedures : CPT4 Code Description Modifier [id number] - WC PHYS LEVEL 3 - EST PT ICD-10 Diagnosis Description [name], [name] ([id number]) [id number] I87.332 Chronic venous hypertension (idiopathic) with ulcer and inflammation of left  lower extremity L97.828 Non-pressure chronic ulcer of other part of left lower leg with other specified severity E11.51 Type 2 diabetes mellitus with diabetic peripheral angiopathy without gangrene  Quantity: 1 27.pdf Page 7 of 7 Electronic Signature(s) Signed: 09/08/2023 5:18:32 PM By: Frank Najjar MD Signed: 09/08/2023 5:51:07 PM By: Redmond Pulling RN, BSN Entered By: Redmond Pulling on 09/08/2023 14:11:47

## 2023-09-20 ENCOUNTER — Encounter (HOSPITAL_BASED_OUTPATIENT_CLINIC_OR_DEPARTMENT_OTHER): Payer: Medicare Other | Admitting: General Surgery

## 2023-09-20 DIAGNOSIS — E11621 Type 2 diabetes mellitus with foot ulcer: Secondary | ICD-10-CM | POA: Diagnosis not present

## 2023-09-21 NOTE — Progress Notes (Signed)
 Pollick, Ab [name] ([id number]) [id number] Page 1 of 9 Visit Report for 09/20/2023 Arrival Information Details Patient Name: Date of Service: [name], [name] 09/20/2023 11:00 A M Medical Record Number: [id number] Patient Account Number: [id number] Date of Birth/Sex: Treating RN: [birth year] (83 y.o. M) Primary Care [name]: [name] Other Clinician: Referring [name]: Treating [name]/Extender: [name] [name] [name] in Treatment: 7 Visit Information History Since Last Visit Added or deleted any medications: No Patient Arrived: Ambulatory Any new allergies or adverse reactions: No Arrival Time: 11:09 Had a fall or experienced change in No Accompanied By: wife activities of daily living that may affect Transfer Assistance: None risk of falls: Patient Identification Verified: Yes Signs or symptoms of abuse/neglect since last visito No Secondary Verification Process Completed: Yes Hospitalized since last visit: No Patient Requires Transmission-Based Precautions: No Implantable device outside of the clinic excluding No Patient Has Alerts: Yes cellular tissue based products placed in the center Patient Alerts: ABI R 0.97 since last visit: ABI L 0.61 Pain Present Now: No TBI 0.46 Electronic Signature(s) Signed: 09/20/2023 3:31:42 PM By: [name] Entered By: [name] on 09/20/2023 11:09:24 -------------------------------------------------------------------------------- Compression Therapy Details Patient Name: Date of Service: [name] 09/20/2023 11:00 A M Medical Record Number: [id number] Patient Account Number: [id number] Date of Birth/Sex: Treating RN: Sep 09, 1941 (83 y.o. [name] Primary Care Nashon Erbes: [name] Other Clinician: Referring Allison Silva: Treating [name]/Extender: [name] [name] [name] in Treatment: 7 Compression Therapy Performed for Wound Assessment: Wound #1 Left,Distal,Lateral  Lower Leg Performed By: Clinician Frank Pollen, RN Compression Type: Three Layer Post Procedure Diagnosis Same as Pre-procedure Electronic Signature(s) Signed: 09/20/2023 5:13:52 PM By: Frank Pollen RN Entered By: [name] on 09/20/2023 11:41:07 Compression Therapy Details -------------------------------------------------------------------------------- [name] ([id number]) [id number] Page 2 of 9 Patient Name: Date of Service: [name] 09/20/2023 11:00 A M Medical Record Number: [id number] Patient Account Number: [id number] Date of Birth/Sex: Treating RN: 02-01-1941 (83 y.o. [name] Primary Care Arjen Deringer: [name] Other Clinician: Referring Omari Koslosky: Treating Othelia Riederer/Extender: [name] [name] [name] in Treatment: 7 Compression Therapy Performed for Wound Assessment: Wound #2 Left,Proximal,Lateral Lower Leg Performed By: Clinician Frank Pollen, RN Compression Type: Three Layer Post Procedure Diagnosis Same as Pre-procedure Electronic Signature(s) Signed: 09/20/2023 5:13:52 PM By: Frank Pollen RN Entered By: [name] on 09/20/2023 11:41:07 -------------------------------------------------------------------------------- Encounter Discharge Information Details Patient Name: Date of Service: [name] 09/20/2023 11:00 A M Medical Record Number: [id number] Patient Account Number: [id number] Date of Birth/Sex: Treating RN: 1941-05-24 (83 y.o. [name] Primary Care Kortnie Stovall: [name] Other Clinician: Referring Diann Bangerter: Treating Mandie Crabbe/Extender: [name] [name] [name] in Treatment: 7 Encounter Discharge Information Items Post Procedure Vitals Discharge Condition: Stable Temperature (F): 97.8 Ambulatory Status: Ambulatory Pulse (bpm): 52 Discharge Destination: Home Respiratory Rate (breaths/min): 18 Transportation: Private Auto Blood Pressure (mmHg):  198/77 Accompanied By: spouse Schedule Follow-up Appointment: Yes Clinical Summary of Care: Patient Declined Electronic Signature(s) Signed: 09/20/2023 5:13:52 PM By: Frank Pollen RN Entered By: [name] on 09/20/2023 13:12:22 -------------------------------------------------------------------------------- Lower Extremity Assessment Details Patient Name: Date of Service: [name], [name] 09/20/2023 11:00 A M Medical Record Number: [id number] Patient Account Number: [id number] Date of Birth/Sex: Treating RN: 10-Sep-1941 (83 y.o. [name] Primary Care Cesia Orf: [name] Other Clinician: Referring Ami Mally: Treating Voncile Schwarz/Extender: [name] [name] Weeks in Treatment: 7 Edema Assessment Assessed: [Left: No] [Right: No] Edema: [Left: Ye] [Right: s] Calf Left: Right: Point of Measurement: 30 cm From  Medial Instep 39 cm [name], [name] ([id number]) [id number] Page 3 of 9 Ankle Left: Right: Point of Measurement: 12 cm From Medial Instep 22.4 cm Vascular Assessment Pulses: Dorsalis Pedis Palpable: [Left:Yes] Extremity colors, hair growth, and conditions: Extremity Color: [Left:Red] Hair Growth on Extremity: [Left:No] Temperature of Extremity: [Left:Warm] Capillary Refill: [Left:< 3 seconds] Dependent Rubor: [Left:No Yes] Electronic Signature(s) Signed: 09/20/2023 5:13:52 PM By: Frank Pollen RN Entered By: [name] on 09/20/2023 11:32:35 -------------------------------------------------------------------------------- Multi Wound Chart Details Patient Name: Date of Service: Frank FRANNE REGULUS [name] 09/20/2023 11:00 A M Medical Record Number: [id number] Patient Account Number: [id number] Date of Birth/Sex: Treating RN: [birth year] (83 y.o. M) Primary Care Ann Groeneveld: [name] Other Clinician: Referring Terah Robey: Treating Latoyia Tecson/Extender: [name] [name] [name] in Treatment: 7 Vital  Signs Height(in): 64 Pulse(bpm): 52 Weight(lbs): 189.4 Blood Pressure(mmHg): 198/77 Body Mass Index(BMI): 32.5 Temperature(F): 97.8 Respiratory Rate(breaths/min): 18 [1:Photos:] [N/A:N/A] Left, Distal, Lateral Lower Leg Left, Proximal, Lateral Lower Leg N/A Wound Location: Bump Gradually Appeared N/A Wounding Event: Venous Leg Ulcer Venous Leg Ulcer N/A Primary Etiology: Cataracts, Arrhythmia, Congestive Cataracts, Arrhythmia, Congestive N/A Comorbid History: Heart Failure, Hypertension, Gout Heart Failure, Hypertension, Gout 06/13/2023 08/22/2023 N/A Date Acquired: 7 4 N/A Weeks of Treatment: Open Open N/A Wound Status: No No N/A Wound Recurrence: No Yes N/A Clustered Wound: N/A 2 N/A Clustered Quantity: 5.9x2.9x0.2 0.1x0.1x0.1 N/A Measurements L x [name] x D (cm) 13.438 0.008 N/A A (cm) : rea 2.688 0.001 N/A Volume (cm) : 84.30% 99.80% N/A % Reduction in Area: 92.20% 99.80% N/A % Reduction in Volume: Full Thickness Without Exposed Full Thickness Without Exposed N/A Classification: Support Structures Support Structures Medium Medium N/A Exudate Amount: [name], [name] ([id number]) [id number] Page 4 of 9 Serosanguineous Serosanguineous N/A Exudate Type: red, brown red, brown N/A Exudate Color: Distinct, outline attached Distinct, outline attached N/A Wound Margin: Large (67-100%) Medium (34-66%) N/A Granulation Amount: Red Red N/A Granulation Quality: Small (1-33%) Medium (34-66%) N/A Necrotic Amount: Adherent Slough Eschar, Adherent Slough N/A Necrotic Tissue: Fat Layer (Subcutaneous Tissue): Yes Fat Layer (Subcutaneous Tissue): Yes N/A Exposed Structures: Fascia: No Fascia: No Tendon: No Tendon: No Muscle: No Muscle: No Joint: No Joint: No Bone: No Bone: No Large (67-100%) Large (67-100%) N/A Epithelialization: Debridement - Excisional Debridement - Excisional N/A Debridement: Pre-procedure Verification/Time Out 11:37  11:37 N/A Taken: Lidocaine  5% topical ointment Lidocaine  5% topical ointment N/A Pain Control: Subcutaneous, Slough Necrotic/Eschar, Subcutaneous, N/A Tissue Debrided: Slough Skin/Subcutaneous Tissue Skin/Subcutaneous Tissue N/A Level: 13.43 0.01 N/A Debridement A (sq cm): rea Curette Curette N/A Instrument: Minimum Minimum N/A Bleeding: Pressure Pressure N/A Hemostasis Achieved: Procedure was tolerated well Procedure was tolerated well N/A Debridement Treatment Response: 5.9x2.9x0.2 0.1x0.1x0.1 N/A Post Debridement Measurements L x [name] x D (cm) 2.688 0.001 N/A Post Debridement Volume: (cm) Scarring: Yes Scarring: Yes N/A Periwound Skin Texture: Excoriation: No Excoriation: No Induration: No Induration: No Callus: No Callus: No Crepitus: No Crepitus: No Rash: No Rash: No Maceration: No Maceration: No N/A Periwound Skin Moisture: Dry/Scaly: No Dry/Scaly: No Hemosiderin Staining: Yes Hemosiderin Staining: Yes N/A Periwound Skin Color: Atrophie Blanche: No Atrophie Blanche: No Cyanosis: No Cyanosis: No Ecchymosis: No Ecchymosis: No Erythema: No Erythema: No Mottled: No Mottled: No Pallor: No Pallor: No Rubor: No Rubor: No No Abnormality No Abnormality N/A Temperature: Compression Therapy Compression Therapy N/A Procedures Performed: Debridement Debridement Treatment Notes Electronic Signature(s) Signed: 09/20/2023 12:57:02 PM By: Frank Delon MD FACS Entered By: [name] on 09/20/2023 12:20:41 -------------------------------------------------------------------------------- Multi-Disciplinary Care Plan Details Patient Name: Date of Service:  [name], [name] 09/20/2023 11:00 A M Medical Record Number: [id number] Patient Account Number: [id number] Date of Birth/Sex: Treating RN: 06-Sep-1941 (83 y.o. [name] Primary Care Ixel Boehning: [name] Other Clinician: Referring Reagan Behlke: Treating Dorothie Wah/Extender: [name] [name] [name] in Treatment: 7 Active Inactive Wound/Skin Impairment Nursing Diagnoses: Impaired tissue integrity Belkin, Ying [name] ([id number]) [id number] Page 5 of 9 Goals: Patient/caregiver will verbalize understanding of skin care regimen Date Initiated: 07/28/2023 Target Resolution Date: 04/12/2024 Goal Status: Active Interventions: Assess ulceration(s) every visit Treatment Activities: Skin care regimen initiated : 07/28/2023 Notes: Electronic Signature(s) Signed: 09/20/2023 5:13:52 PM By: Frank Pollen RN Entered By: [name] on 09/20/2023 13:10:43 -------------------------------------------------------------------------------- Pain Assessment Details Patient Name: Date of Service: JERMINE, BIBBEE. 09/20/2023 11:00 A M Medical Record Number: [id number] Patient Account Number: [id number] Date of Birth/Sex: Treating RN: Aug 07, 1941 (83 y.o. M) Primary Care Chiyoko Torrico: [name] Other Clinician: Referring Laketra Bowdish: Treating Ivery Nanney/Extender: [name] [name] [name] in Treatment: 7 Active Problems Location of Pain Severity and Description of Pain Patient Has Paino No Site Locations Pain Management and Medication Current Pain Management: Electronic Signature(s) Signed: 09/20/2023 3:31:42 PM By: [name] Entered By: [name] on 09/20/2023 11:09:54 Patient/Caregiver Education Details -------------------------------------------------------------------------------- Frank REGULUS [name] ([id number]) [id number] Page 6 of 9 Patient Name: Date of Service: [name], [name] 12/31/2024andnbsp11:00 A M Medical Record Number: [id number] Patient Account Number: [id number] Date of Birth/Gender: Treating RN: January 06, 1941 (83 y.o. [name] Primary Care Physician: [name] Other Clinician: Referring Physician: Treating Physician/Extender: [name] [name] [name] in Treatment:  7 Education Assessment Education Provided To: Patient Education Topics Provided Wound/Skin Impairment: Methods: Explain/Verbal Responses: State content correctly Electronic Signature(s) Signed: 09/20/2023 5:13:52 PM By: Frank Pollen RN Entered By: [name] on 09/20/2023 13:10:59 -------------------------------------------------------------------------------- Wound Assessment Details Patient Name: Date of Service: [name], [name] 09/20/2023 11:00 A M Medical Record Number: [id number] Patient Account Number: [id number] Date of Birth/Sex: Treating RN: [birth year] (83 y.o. M) Primary Care Oluwadamilare Tobler: [name] Other Clinician: Referring Weber Monnier: Treating Breia Ocampo/Extender: [name] [name] [name] in Treatment: 7 Wound Status Wound Number: 1 Primary Venous Leg Ulcer Etiology: Wound Location: Left, Distal, Lateral Lower Leg Wound Status: Open Wounding Event: Bump Comorbid Cataracts, Arrhythmia, Congestive Heart Failure, Date Acquired: 06/13/2023 History: Hypertension, Gout Weeks Of Treatment: 7 Clustered Wound: No Photos Wound Measurements Length: (cm) 5.9 Width: (cm) 2.9 Depth: (cm) 0.2 Area: (cm) 13.438 Volume: (cm) 2.688 % Reduction in Area: 84.3% % Reduction in Volume: 92.2% Epithelialization: Large (67-100%) Tunneling: No Undermining: No Wound Description Classification: Full Thickness Without Exposed Support Structures Wound Margin: Distinct, outline attached Exudate Amount: Medium Mollica, Bricyn [name] ([id number]) Exudate Type: Serosanguineous Exudate Color: red, brown Foul Odor After Cleansing: No Slough/Fibrino Yes [id number] Page 7 of 9 Wound Bed Granulation Amount: Large (67-100%) Exposed Structure Granulation Quality: Red Fascia Exposed: No Necrotic Amount: Small (1-33%) Fat Layer (Subcutaneous Tissue) Exposed: Yes Necrotic Quality: Adherent Slough Tendon Exposed: No Muscle Exposed: No Joint Exposed:  No Bone Exposed: No Periwound Skin Texture Texture Color No Abnormalities Noted: No No Abnormalities Noted: No Callus: No Atrophie Blanche: No Crepitus: No Cyanosis: No Excoriation: No Ecchymosis: No Induration: No Erythema: No Rash: No Hemosiderin Staining: Yes Scarring: Yes Mottled: No Pallor: No Moisture Rubor: No No Abnormalities Noted: Yes Temperature / Pain Temperature: No Abnormality Treatment Notes Wound #1 (Lower Leg) Wound Laterality: Left, Lateral, Distal Cleanser Soap and Water  Discharge Instruction: May shower and wash wound with dial antibacterial soap and water  prior to  dressing change. Peri-Wound Care Topical Gentamicin Discharge Instruction: As directed by physician Mupirocin Ointment Discharge Instruction: Apply Mupirocin (Bactroban) as instructed Triamcinolone Discharge Instruction: Apply Triamcinolone as directed Primary Dressing Hydrofera Blue Ready Transfer Foam, 4x5 (in/in) Discharge Instruction: Apply to wound bed as instructed Secondary Dressing ABD Pad, 8x10 Discharge Instruction: Apply over primary dressing as directed. Woven Gauze Sponge, Non-Sterile 4x4 in Discharge Instruction: Apply over primary dressing as directed. Secured With Compression Wrap Urgo K2 Lite, (equivalent to a 3 layer) two layer compression system, regular Discharge Instruction: Apply Urgo K2 Lite as directed (alternative to 3 layer compression). Compression Stockings Add-Ons Electronic Signature(s) Signed: 09/20/2023 5:13:52 PM By: Frank Pollen RN Entered By: Scotton, Joanne on 09/20/2023 11:32:52 Frank AZUCENA ORN ([id number]) [id number] Page 8 of 9 -------------------------------------------------------------------------------- Wound Assessment Details Patient Name: Date of Service: [name], [name] 09/20/2023 11:00 A M Medical Record Number: [id number] Patient Account Number: [id number] Date of Birth/Sex: Treating RN: 1941-03-08  (83 y.o. M) Primary Care Alexyss Balzarini: [name] Other Clinician: Referring Ahmet Schank: Treating Shyah Cadmus/Extender: [name] [name] [name] in Treatment: 7 Wound Status Wound Number: 2 Primary Venous Leg Ulcer Etiology: Wound Location: Left, Proximal, Lateral Lower Leg Wound Status: Open Wounding Event: Gradually Appeared Comorbid Cataracts, Arrhythmia, Congestive Heart Failure, Date Acquired: 08/22/2023 History: Hypertension, Gout Weeks Of Treatment: 4 Clustered Wound: Yes Photos Wound Measurements Length: (cm) Width: (cm) Depth: (cm) Clustered Quantity: Area: (cm) Volume: (cm) 0.1 % Reduction in Area: 99.8% 0.1 % Reduction in Volume: 99.8% 0.1 Epithelialization: Large (67-100%) 2 Tunneling: No 0.008 Undermining: No 0.001 Wound Description Classification: Full Thickness Without Exposed Sup Wound Margin: Distinct, outline attached Exudate Amount: Medium Exudate Type: Serosanguineous Exudate Color: red, brown port Structures Foul Odor After Cleansing: No Slough/Fibrino Yes Wound Bed Granulation Amount: Medium (34-66%) Exposed Structure Granulation Quality: Red Fascia Exposed: No Necrotic Amount: Medium (34-66%) Fat Layer (Subcutaneous Tissue) Exposed: Yes Necrotic Quality: Eschar, Adherent Slough Tendon Exposed: No Muscle Exposed: No Joint Exposed: No Bone Exposed: No Periwound Skin Texture Texture Color No Abnormalities Noted: No No Abnormalities Noted: No Callus: No Atrophie Blanche: No Crepitus: No Cyanosis: No Excoriation: No Ecchymosis: No Induration: No Erythema: No Rash: No Hemosiderin Staining: Yes Scarring: Yes Mottled: No Pallor: No Moisture Rubor: No No Abnormalities Noted: Yes Temperature / Pain Temperature: No Abnormality [name], [name] ([id number]) [id number] Page 9 of 9 Treatment Notes Wound #2 (Lower Leg) Wound Laterality: Left, Lateral, Proximal Cleanser Soap and Water  Discharge Instruction:  May shower and wash wound with dial antibacterial soap and water  prior to dressing change. Peri-Wound Care Topical Gentamicin Discharge Instruction: As directed by physician Mupirocin Ointment Discharge Instruction: Apply Mupirocin (Bactroban) as instructed Triamcinolone Discharge Instruction: Apply Triamcinolone as directed Primary Dressing Hydrofera Blue Ready Transfer Foam, 4x5 (in/in) Discharge Instruction: Apply to wound bed as instructed Secondary Dressing ABD Pad, 8x10 Discharge Instruction: Apply over primary dressing as directed. Woven Gauze Sponge, Non-Sterile 4x4 in Discharge Instruction: Apply over primary dressing as directed. Secured With Compression Wrap Urgo K2 Lite, (equivalent to a 3 layer) two layer compression system, regular Discharge Instruction: Apply Urgo K2 Lite as directed (alternative to 3 layer compression). Compression Stockings Add-Ons Electronic Signature(s) Signed: 09/20/2023 5:13:52 PM By: Frank Pollen RN Entered By: Scotton, Joanne on 09/20/2023 11:33:31 -------------------------------------------------------------------------------- Vitals Details Patient Name: Date of Service: [name] 09/20/2023 11:00 A M Medical Record Number: [id number] Patient Account Number: [id number] Date of Birth/Sex: Treating RN: 1941-02-12 (83 y.o. M) Primary Care Mariyana Fulop: [name] Other Clinician: Referring Mylia Pondexter: Treating Caty Tessler/Extender: Frank  [name] Cork, Katie Weeks in Treatment: 7 Vital Signs Time Taken: 11:09 Temperature (F): 97.8 Height (in): 64 Pulse (bpm): 52 Weight (lbs): 189.4 Respiratory Rate (breaths/min): 18 Body Mass Index (BMI): 32.5 Blood Pressure (mmHg): 198/77 Reference Range: 80 - 120 mg / dl Electronic Signature(s) Signed: 09/20/2023 3:31:42 PM By: [name] Entered By: [name] on 09/20/2023 11:09:47

## 2023-09-21 NOTE — Progress Notes (Signed)
 Klann, Zaidyn W ([id number]) [id number] Page 1 of 9 Visit Report for 09/20/2023 Chief Complaint Document Details Patient Name: Date of Service: JAKALEB, PAYER. 09/20/2023 11:00 A M Medical Record Number: [id number] Patient Account Number: [id number] Date of Birth/Sex: Treating RN: [birth year] (83 y.o. M) Primary Care Provider: Corlis Pagan Other Clinician: Referring Provider: Treating Provider/Extender: Marolyn Delon Corlis Pagan Devra in Treatment: 7 Information Obtained from: Patient Chief Complaint 07/28/2023; patient is here for review of a sizable wound on the left lateral lower leg Electronic Signature(s) Signed: 09/20/2023 12:57:02 PM By: Marolyn Delon MD FACS Entered By: Marolyn Delon on 09/20/2023 12:21:55 -------------------------------------------------------------------------------- Debridement Details Patient Name: Date of Service: IVIE [name] 09/20/2023 11:00 A M Medical Record Number: [id number] Patient Account Number: [id number] Date of Birth/Sex: Treating RN: Dec 23, 1940 (83 y.o. NETTY Claven Pollen Primary Care Provider: Corlis Pagan Other Clinician: Referring Provider: Treating Provider/Extender: Marolyn Delon Corlis Pagan Devra in Treatment: 7 Debridement Performed for Assessment: Wound #1 Left,Distal,Lateral Lower Leg Performed By: Physician Marolyn Delon, MD The following information was scribed by: Claven Pollen The information was scribed for: Marolyn Delon Debridement Type: Debridement Severity of Tissue Pre Debridement: Fat layer exposed Level of Consciousness (Pre-procedure): Awake and Alert Pre-procedure Verification/Time Out Yes - 11:37 Taken: Start Time: 11:37 Pain Control: Lidocaine  5% topical ointment Percent of Wound Bed Debrided: 100% T Area Debrided (cm): otal 13.43 Tissue and other material debrided: Viable, Non-Viable, Slough, Subcutaneous, Slough Level: Skin/Subcutaneous  Tissue Debridement Description: Excisional Instrument: Curette Bleeding: Minimum Hemostasis Achieved: Pressure Response to Treatment: Procedure was tolerated well Level of Consciousness (Post- Awake and Alert procedure): Post Debridement Measurements of Total Wound Length: (cm) 5.9 Width: (cm) 2.9 Depth: (cm) 0.2 Volume: (cm) 2.688 Character of Wound/Ulcer Post Debridement: Improved Severity of Tissue Post Debridement: Fat layer exposed ROZANNA AZUCENA LELON ([id number]) [id number] Page 2 of 9 Post Procedure Diagnosis Same as Pre-procedure Electronic Signature(s) Signed: 09/20/2023 12:57:02 PM By: Marolyn Delon MD FACS Signed: 09/20/2023 5:13:52 PM By: Claven Pollen RN Entered By: Claven Pollen on 09/20/2023 11:42:52 -------------------------------------------------------------------------------- Debridement Details Patient Name: Date of Service: IVIE [name] 09/20/2023 11:00 A M Medical Record Number: [id number] Patient Account Number: [id number] Date of Birth/Sex: Treating RN: Apr 01, 1941 (83 y.o. NETTY Claven Pollen Primary Care Provider: Corlis Pagan Other Clinician: Referring Provider: Treating Provider/Extender: Marolyn Delon Corlis Pagan Devra in Treatment: 7 Debridement Performed for Assessment: Wound #2 Left,Proximal,Lateral Lower Leg Performed By: Physician Marolyn Delon, MD The following information was scribed by: Claven Pollen The information was scribed for: Marolyn Delon Debridement Type: Debridement Severity of Tissue Pre Debridement: Fat layer exposed Level of Consciousness (Pre-procedure): Awake and Alert Pre-procedure Verification/Time Out Yes - 11:37 Taken: Start Time: 11:37 Pain Control: Lidocaine  5% topical ointment Percent of Wound Bed Debrided: 100% T Area Debrided (cm): otal 0.01 Tissue and other material debrided: Viable, Non-Viable, Eschar, Slough, Subcutaneous, Slough Level: Skin/Subcutaneous  Tissue Debridement Description: Excisional Instrument: Curette Bleeding: Minimum Hemostasis Achieved: Pressure Response to Treatment: Procedure was tolerated well Level of Consciousness (Post- Awake and Alert procedure): Post Debridement Measurements of Total Wound Length: (cm) 0.1 Width: (cm) 0.1 Depth: (cm) 0.1 Volume: (cm) 0.001 Character of Wound/Ulcer Post Debridement: Improved Severity of Tissue Post Debridement: Fat layer exposed Post Procedure Diagnosis Same as Pre-procedure Electronic Signature(s) Signed: 09/20/2023 12:57:02 PM By: Marolyn Delon MD FACS Signed: 09/20/2023 5:13:52 PM By: Claven Pollen RN Entered By: Claven Pollen on 09/20/2023 11:43:57 HPI Details -------------------------------------------------------------------------------- ROZANNA AZUCENA LELON ([id number]) [id number] Page 3 of 9 Patient Name: Date of Service: TEA  CHEY, Christofer W. 09/20/2023 11:00 A M Medical Record Number: [id number] Patient Account Number: [id number] Date of Birth/Sex: Treating RN: 1940/11/10 (83 y.o. M) Primary Care Provider: Corlis Pagan Other Clinician: Referring Provider: Treating Provider/Extender: Marolyn Delon Corlis Pagan Devra in Treatment: 7 History of Present Illness HPI Description: ADMISSION 07/28/2023. This is an 83 year old subsomewhat frail man who arrived accompanied by his wife and daughter. He has a large chronic ulcer on the left lateral lower leg. He was hospitalized from 10/30 through 07/23/2023 with a nonhealing wound in the left leg felt to have coexistent cellulitis. He was seen in the hospital by Dr. Harden. Discharged on Duricef and Flagyl . He is supposed to follow-up with Dr. Harden next week. He also saw a vein and vascular and did not feel he needed any intervention. I note that he had previously been seen earlier this year by Dr. Oneil Brew at [facility]. Felt to have chronic venous hypertension and secondary  lymphedema. At 1 point he was discovered to have maggots in this wound. Using Xeroform and T elfa. He has home health going out to see him and there is supposed to come tomorrow. Past medical history includes paroxysmal atrial fibrillation, hypertension, type 2 diabetes, chronic kidney disease, known PAD, heart failure with preserved ejection fraction He had arterial studies done on 07/21/2023. On the right his ABI was 0.97 TBI of 0.48 but he had triphasic and wave biphasic waveforms. On the left his TBI was 0.61. I do not think anything further was done. 11/13; large chronic venous insufficiency wound on the left lateral lower leg with 2 small superior satellite lesions. We used Santyl  and Hydrofera Blue under and Urgo K2 light compression. He has some degree of PAD which is the reason for the Urgo K2 lite's. He seems to have tolerated this surprisingly well. He has completed his antibiotics 11/19; large chronic venous insufficiency wound on the left lateral lower leg with 2 small satellite lesions superiorly. We are using Santyl  and Hydrofera Blue under an Urgo K2 light compression. There is seems to been some confusion about the need for home health I think it would be really in the patient's best interest to have home health change this 1 time and we will do it 1 time in clinic per week 12/3; we are following this patient for a large chronic venous ulcers on the left lateral lower leg with 2 caudal satellite lesions. We have been using Santyl , TCA Hydrofera Blue under and Urgo K2 lite. We did a PCR culture on him 2 weeks ago that showed Pseudomonas and Enterococcus. I gave him a 10-day course of Levaquin which she completed. We had referred him for a [facility] prepared topical antibiotic combination however his wife complained about the $100 co- pay being unaffordable. Therefore that was not done. The wound is really completely unchanged today 12/10; large chronic venous ulcers on the left lateral  leg with 2 caudal satellite lesions. Last week we put gent and mupirocin on this under silver alginate then he appears to be doing somewhat better. Better looking granulation. We are using Urgo K2 light compression 12/19; the area on the left lateral lower leg which is a large wound with 2 caudal satellite lesions seems to be contracting including the satellite lesions. We have been putting gent and mupirocin on the surface with silver alginate as the primary dressing under Urgo K2 light compression. The latter seems to be controlling his swelling 09/20/2023: The caudal satellite lesion appears to be closed.  There is a proximal satellite lesion that is nearly closed underneath some eschar and slough. The main wound is fairly fibrotic but does have buds of granulation tissue emerging. Edema control is good. Electronic Signature(s) Signed: 09/20/2023 12:57:02 PM By: Marolyn Nest MD FACS Entered By: Marolyn Nest on 09/20/2023 12:22:58 -------------------------------------------------------------------------------- Physical Exam Details Patient Name: Date of Service: IVIE FRANNE [name] 09/20/2023 11:00 A M Medical Record Number: [id number] Patient Account Number: [id number] Date of Birth/Sex: Treating RN: 1941-03-21 (83 y.o. M) Primary Care Provider: Corlis Pagan Other Clinician: Referring Provider: Treating Provider/Extender: Marolyn Nest Corlis Pagan Devra in Treatment: 7 Constitutional Hypertensive, asymptomatic. Bradycardic, asymptomatic. . . no acute distress. Respiratory Normal work of breathing on room air.. Notes 09/20/2023: The caudal satellite lesion appears to be closed. There is a proximal satellite lesion that is nearly closed underneath some eschar and slough. The main wound is fairly fibrotic but does have buds of granulation tissue emerging. Edema control is good. Cieslewicz, Guhan W ([id number]) [id number] Page 4 of 9 Electronic  Signature(s) Signed: 09/20/2023 12:57:02 PM By: Marolyn Nest MD FACS Entered By: Marolyn Nest on 09/20/2023 12:23:32 -------------------------------------------------------------------------------- Physician Orders Details Patient Name: Date of Service: IVIE FRANNE [name] 09/20/2023 11:00 A M Medical Record Number: [id number] Patient Account Number: [id number] Date of Birth/Sex: Treating RN: [birth year] (83 y.o. NETTY Claven Pollen Primary Care Provider: Corlis Pagan Other Clinician: Referring Provider: Treating Provider/Extender: Marolyn Nest Corlis Pagan Devra in Treatment: 7 The following information was scribed by: Claven Pollen The information was scribed for: Marolyn Nest Verbal / Phone Orders: No Diagnosis Coding ICD-10 Coding Code Description [phone] Non-pressure chronic ulcer of other part of left lower leg with other specified severity I87.332 Chronic venous hypertension (idiopathic) with ulcer and inflammation of left lower extremity E11.51 Type 2 diabetes mellitus with diabetic peripheral angiopathy without gangrene Follow-up Appointments ppointment in 1 week. - Dr. Rosan 09/27/23 at 1:45pm Return A ppointment in 2 weeks. - Dr. Rosan *** Please schedule*** Return A Return appointment in 3 weeks. - Dr Rosan *** Please schedule*** Anesthetic (In clinic) Topical Lidocaine  5% applied to wound bed Bathing/ Shower/ Hygiene May shower with protection but do not get wound dressing(s) wet. Protect dressing(s) with water  repellant cover (for example, large plastic bag) or a cast cover and may then take shower. - Do not get the left leg wet. Keep the left leg dry until the next appointment. Edema Control - Orders / Instructions Elevate legs to the level of the heart or above for 30 minutes daily and/or when sitting for 3-4 times a day throughout the day. - As tolerated. Keep legs up when sitting/lying Avoid standing for long periods of time. Home Health New  wound care orders this week; continue Home Health for wound care. May utilize formulary equivalent dressing for wound treatment orders unless otherwise specified. - TCA to periwound. Mupirocin and Gentamicin to wound bed to wound bed and Hydrafera Blue Ready over gent. and Mup. and then use cover wound -gauze,ABD pad and URGO K2 or 3 layer compression wraps. Other Home Health Orders/Instructions: - [facility] [phone] Wound Treatment Wound #1 - Lower Leg Wound Laterality: Left, Lateral, Distal Cleanser: Soap and Water  2 x Per Week/30 Days Discharge Instructions: May shower and wash wound with dial antibacterial soap and water  prior to dressing change. Topical: Gentamicin 2 x Per Week/30 Days Discharge Instructions: As directed by physician Topical: Mupirocin Ointment 2 x Per Week/30 Days Discharge Instructions: Apply Mupirocin (Bactroban) as instructed Topical: Triamcinolone 2 x Per  Week/30 Days Discharge Instructions: Apply Triamcinolone as directed Prim Dressing: Hydrofera Blue Ready Transfer Foam, 4x5 (in/in) 2 x Per Week/30 Days ary Discharge Instructions: Apply to wound bed as instructed Secondary Dressing: ABD Pad, 8x10 2 x Per Week/30 Days Discharge Instructions: Apply over primary dressing as directed. Secondary Dressing: Woven Gauze Sponge, Non-Sterile 4x4 in 2 x Per Week/30 Days Discharge Instructions: Apply over primary dressing as directed. Shearn, Kashon W ([id number]) [id number] Page 5 of 9 Compression Wrap: Urgo K2 Lite, (equivalent to a 3 layer) two layer compression system, regular 2 x Per Week/30 Days Discharge Instructions: Apply Urgo K2 Lite as directed (alternative to 3 layer compression). Wound #2 - Lower Leg Wound Laterality: Left, Lateral, Proximal Cleanser: Soap and Water  2 x Per Week/30 Days Discharge Instructions: May shower and wash wound with dial antibacterial soap and water  prior to dressing change. Topical:  Gentamicin 2 x Per Week/30 Days Discharge Instructions: As directed by physician Topical: Mupirocin Ointment 2 x Per Week/30 Days Discharge Instructions: Apply Mupirocin (Bactroban) as instructed Topical: Triamcinolone 2 x Per Week/30 Days Discharge Instructions: Apply Triamcinolone as directed Prim Dressing: Hydrofera Blue Ready Transfer Foam, 4x5 (in/in) 2 x Per Week/30 Days ary Discharge Instructions: Apply to wound bed as instructed Secondary Dressing: ABD Pad, 8x10 2 x Per Week/30 Days Discharge Instructions: Apply over primary dressing as directed. Secondary Dressing: Woven Gauze Sponge, Non-Sterile 4x4 in 2 x Per Week/30 Days Discharge Instructions: Apply over primary dressing as directed. Compression Wrap: Urgo K2 Lite, (equivalent to a 3 layer) two layer compression system, regular 2 x Per Week/30 Days Discharge Instructions: Apply Urgo K2 Lite as directed (alternative to 3 layer compression). Electronic Signature(s) Signed: 09/20/2023 12:57:02 PM By: Marolyn Nest MD FACS Entered By: Marolyn Nest on 09/20/2023 12:23:55 -------------------------------------------------------------------------------- Problem List Details Patient Name: Date of Service: IVIE FRANNE [name] 09/20/2023 11:00 A M Medical Record Number: [id number] Patient Account Number: [id number] Date of Birth/Sex: Treating RN: [birth year] (83 y.o. M) Primary Care Provider: Corlis Pagan Other Clinician: Referring Provider: Treating Provider/Extender: Marolyn Nest Corlis Pagan Devra in Treatment: 7 Active Problems ICD-10 Encounter Code Description Active Date MDM Diagnosis L97.828 Non-pressure chronic ulcer of other part of left lower leg with other specified 07/28/2023 No Yes severity I87.332 Chronic venous hypertension (idiopathic) with ulcer and inflammation of left 07/28/2023 No Yes lower extremity E11.51 Type 2 diabetes mellitus with diabetic peripheral angiopathy without gangrene 07/28/2023 No  Yes Inactive Problems ICD-10 LADARRIUS, BOGDANSKI ([id number]) [id number] Page 6 of 9 Code Description Active Date Inactive Date L03.116 Cellulitis of left lower limb 07/28/2023 07/28/2023 Resolved Problems Electronic Signature(s) Signed: 09/20/2023 12:57:02 PM By: Marolyn Nest MD FACS Entered By: Marolyn Nest on 09/20/2023 12:20:30 -------------------------------------------------------------------------------- Progress Note Details Patient Name: Date of Service: IVIE FRANNE [name] 09/20/2023 11:00 A M Medical Record Number: [id number] Patient Account Number: [id number] Date of Birth/Sex: Treating RN: 02/20/1941 (83 y.o. M) Primary Care Provider: Corlis Pagan Other Clinician: Referring Provider: Treating Provider/Extender: Marolyn Nest Corlis Pagan Devra in Treatment: 7 Subjective Chief Complaint Information obtained from Patient 07/28/2023; patient is here for review of a sizable wound on the left lateral lower leg History of Present Illness (HPI) ADMISSION 07/28/2023. This is an 83 year old subsomewhat frail man who arrived accompanied by his wife and daughter. He has a large chronic ulcer on the left lateral lower leg. He was hospitalized from 10/30 through 07/23/2023 with a nonhealing wound in the left leg felt to have coexistent cellulitis. He was seen in the hospital by Dr. Harden.  Discharged on Duricef and Flagyl . He is supposed to follow-up with Dr. Harden next week. He also saw a vein and vascular and did not feel he needed any intervention. I note that he had previously been seen earlier this year by Dr. Oneil Brew at [facility]. Felt to have chronic venous hypertension and secondary lymphedema. At 1 point he was discovered to have maggots in this wound. Using Xeroform and T elfa. He has home health going out to see him and there is supposed to come tomorrow. Past medical history includes paroxysmal atrial fibrillation, hypertension, type  2 diabetes, chronic kidney disease, known PAD, heart failure with preserved ejection fraction He had arterial studies done on 07/21/2023. On the right his ABI was 0.97 TBI of 0.48 but he had triphasic and wave biphasic waveforms. On the left his TBI was 0.61. I do not think anything further was done. 11/13; large chronic venous insufficiency wound on the left lateral lower leg with 2 small superior satellite lesions. We used Santyl  and Hydrofera Blue under and Urgo K2 light compression. He has some degree of PAD which is the reason for the Urgo K2 lite's. He seems to have tolerated this surprisingly well. He has completed his antibiotics 11/19; large chronic venous insufficiency wound on the left lateral lower leg with 2 small satellite lesions superiorly. We are using Santyl  and Hydrofera Blue under an Urgo K2 light compression. There is seems to been some confusion about the need for home health I think it would be really in the patient's best interest to have home health change this 1 time and we will do it 1 time in clinic per week 12/3; we are following this patient for a large chronic venous ulcers on the left lateral lower leg with 2 caudal satellite lesions. We have been using Santyl , TCA Hydrofera Blue under and Urgo K2 lite. We did a PCR culture on him 2 weeks ago that showed Pseudomonas and Enterococcus. I gave him a 10-day course of Levaquin which she completed. We had referred him for a [facility] prepared topical antibiotic combination however his wife complained about the $100 co- pay being unaffordable. Therefore that was not done. The wound is really completely unchanged today 12/10; large chronic venous ulcers on the left lateral leg with 2 caudal satellite lesions. Last week we put gent and mupirocin on this under silver alginate then he appears to be doing somewhat better. Better looking granulation. We are using Urgo K2 light compression 12/19; the area on the left lateral lower  leg which is a large wound with 2 caudal satellite lesions seems to be contracting including the satellite lesions. We have been putting gent and mupirocin on the surface with silver alginate as the primary dressing under Urgo K2 light compression. The latter seems to be controlling his swelling 09/20/2023: The caudal satellite lesion appears to be closed. There is a proximal satellite lesion that is nearly closed underneath some eschar and slough. The main wound is fairly fibrotic but does have buds of granulation tissue emerging. Edema control is good. Keim, Zaccheus W ([id number]) [id number] Page 7 of 9 Objective Constitutional Hypertensive, asymptomatic. Bradycardic, asymptomatic. no acute distress. Vitals Time Taken: 11:09 AM, Height: 64 in, Weight: 189.4 lbs, BMI: 32.5, Temperature: 97.8 F, Pulse: 52 bpm, Respiratory Rate: 18 breaths/min, Blood Pressure: 198/77 mmHg. Respiratory Normal work of breathing on room air.. General Notes: 09/20/2023: The caudal satellite lesion appears to be closed. There is a proximal satellite lesion that is nearly closed  underneath some eschar and slough. The main wound is fairly fibrotic but does have buds of granulation tissue emerging. Edema control is good. Integumentary (Hair, Skin) Wound #1 status is Open. Original cause of wound was Bump. The date acquired was: 06/13/2023. The wound has been in treatment 7 weeks. The wound is located on the Left,Distal,Lateral Lower Leg. The wound measures 5.9cm length x 2.9cm width x 0.2cm depth; 13.438cm^2 area and 2.688cm^3 volume. There is Fat Layer (Subcutaneous Tissue) exposed. There is no tunneling or undermining noted. There is a medium amount of serosanguineous drainage noted. The wound margin is distinct with the outline attached to the wound base. There is large (67-100%) red granulation within the wound bed. There is a small (1-33%) amount of necrotic tissue within the wound bed  including Adherent Slough. The periwound skin appearance had no abnormalities noted for moisture. The periwound skin appearance exhibited: Scarring, Hemosiderin Staining. The periwound skin appearance did not exhibit: Callus, Crepitus, Excoriation, Induration, Rash, Atrophie Blanche, Cyanosis, Ecchymosis, Mottled, Pallor, Rubor, Erythema. Periwound temperature was noted as No Abnormality. Wound #2 status is Open. Original cause of wound was Gradually Appeared. The date acquired was: 08/22/2023. The wound has been in treatment 4 weeks. The wound is located on the Left,Proximal,Lateral Lower Leg. The wound measures 0.1cm length x 0.1cm width x 0.1cm depth; 0.008cm^2 area and 0.001cm^3 volume. There is Fat Layer (Subcutaneous Tissue) exposed. There is no tunneling or undermining noted. There is a medium amount of serosanguineous drainage noted. The wound margin is distinct with the outline attached to the wound base. There is medium (34-66%) red granulation within the wound bed. There is a medium (34-66%) amount of necrotic tissue within the wound bed including Eschar and Adherent Slough. The periwound skin appearance had no abnormalities noted for moisture. The periwound skin appearance exhibited: Scarring, Hemosiderin Staining. The periwound skin appearance did not exhibit: Callus, Crepitus, Excoriation, Induration, Rash, Atrophie Blanche, Cyanosis, Ecchymosis, Mottled, Pallor, Rubor, Erythema. Periwound temperature was noted as No Abnormality. Assessment Active Problems ICD-10 Non-pressure chronic ulcer of other part of left lower leg with other specified severity Chronic venous hypertension (idiopathic) with ulcer and inflammation of left lower extremity Type 2 diabetes mellitus with diabetic peripheral angiopathy without gangrene Procedures Wound #1 Pre-procedure diagnosis of Wound #1 is a Venous Leg Ulcer located on the Left,Distal,Lateral Lower Leg .Severity of Tissue Pre Debridement is: Fat  layer exposed. There was a Excisional Skin/Subcutaneous Tissue Debridement with a total area of 13.43 sq cm performed by Marolyn Nest, MD. With the following instrument(s): Curette to remove Viable and Non-Viable tissue/material. Material removed includes Subcutaneous Tissue and Slough and after achieving pain control using Lidocaine  5% topical ointment. No specimens were taken. A time out was conducted at 11:37, prior to the start of the procedure. A Minimum amount of bleeding was controlled with Pressure. The procedure was tolerated well. Post Debridement Measurements: 5.9cm length x 2.9cm width x 0.2cm depth; 2.688cm^3 volume. Character of Wound/Ulcer Post Debridement is improved. Severity of Tissue Post Debridement is: Fat layer exposed. Post procedure Diagnosis Wound #1: Same as Pre-Procedure Pre-procedure diagnosis of Wound #1 is a Venous Leg Ulcer located on the Left,Distal,Lateral Lower Leg . There was a Three Layer Compression Therapy Procedure by Claven Pollen, RN. Post procedure Diagnosis Wound #1: Same as Pre-Procedure Wound #2 Pre-procedure diagnosis of Wound #2 is a Venous Leg Ulcer located on the Left,Proximal,Lateral Lower Leg .Severity of Tissue Pre Debridement is: Fat layer exposed. There was a Excisional Skin/Subcutaneous Tissue Debridement with  a total area of 0.01 sq cm performed by Marolyn Nest, MD. With the following instrument(s): Curette to remove Viable and Non-Viable tissue/material. Material removed includes Eschar, Subcutaneous Tissue, and Slough after achieving pain control using Lidocaine  5% topical ointment. No specimens were taken. A time out was conducted at 11:37, prior to the start of the procedure. A Minimum amount of bleeding was controlled with Pressure. The procedure was tolerated well. Post Debridement Measurements: 0.1cm length x 0.1cm width x 0.1cm depth; 0.001cm^3 volume. Character of Wound/Ulcer Post Debridement is improved. Severity of Tissue  Post Debridement is: Fat layer exposed. Post procedure Diagnosis Wound #2: Same as Pre-Procedure Pre-procedure diagnosis of Wound #2 is a Venous Leg Ulcer located on the Left,Proximal,Lateral Lower Leg . There was a Three Layer Compression Therapy Procedure by Claven Pollen, RN. Post procedure Diagnosis Wound #2: Same as Pre-Procedure Wallick, Zackary W ([id number]) [id number] Page 8 of 9 Plan Follow-up Appointments: Return Appointment in 1 week. - Dr. Rosan 09/27/23 at 1:45pm Return Appointment in 2 weeks. - Dr. Rosan *** Please schedule*** Return appointment in 3 weeks. - Dr Rosan *** Please schedule*** Anesthetic: (In clinic) Topical Lidocaine  5% applied to wound bed Bathing/ Shower/ Hygiene: May shower with protection but do not get wound dressing(s) wet. Protect dressing(s) with water  repellant cover (for example, large plastic bag) or a cast cover and may then take shower. - Do not get the left leg wet. Keep the left leg dry until the next appointment. Edema Control - Orders / Instructions: Elevate legs to the level of the heart or above for 30 minutes daily and/or when sitting for 3-4 times a day throughout the day. - As tolerated. Keep legs up when sitting/lying Avoid standing for long periods of time. Home Health: New wound care orders this week; continue Home Health for wound care. May utilize formulary equivalent dressing for wound treatment orders unless otherwise specified. - TCA to periwound. Mupirocin and Gentamicin to wound bed to wound bed and Hydrafera Blue Ready over gent. and Mup. and then use cover wound -gauze,ABD pad and URGO K2 or 3 layer compression wraps. Other Home Health Orders/Instructions: - [facility] [phone] WOUND #1: - Lower Leg Wound Laterality: Left, Lateral, Distal Cleanser: Soap and Water  2 x Per Week/30 Days Discharge Instructions: May shower and wash wound with dial antibacterial soap and water   prior to dressing change. Topical: Gentamicin 2 x Per Week/30 Days Discharge Instructions: As directed by physician Topical: Mupirocin Ointment 2 x Per Week/30 Days Discharge Instructions: Apply Mupirocin (Bactroban) as instructed Topical: Triamcinolone 2 x Per Week/30 Days Discharge Instructions: Apply Triamcinolone as directed Prim Dressing: Hydrofera Blue Ready Transfer Foam, 4x5 (in/in) 2 x Per Week/30 Days ary Discharge Instructions: Apply to wound bed as instructed Secondary Dressing: ABD Pad, 8x10 2 x Per Week/30 Days Discharge Instructions: Apply over primary dressing as directed. Secondary Dressing: Woven Gauze Sponge, Non-Sterile 4x4 in 2 x Per Week/30 Days Discharge Instructions: Apply over primary dressing as directed. Com pression Wrap: Urgo K2 Lite, (equivalent to a 3 layer) two layer compression system, regular 2 x Per Week/30 Days Discharge Instructions: Apply Urgo K2 Lite as directed (alternative to 3 layer compression). WOUND #2: - Lower Leg Wound Laterality: Left, Lateral, Proximal Cleanser: Soap and Water  2 x Per Week/30 Days Discharge Instructions: May shower and wash wound with dial antibacterial soap and water  prior to dressing change. Topical: Gentamicin 2 x Per Week/30 Days Discharge Instructions: As directed by physician Topical: Mupirocin Ointment 2 x Per Week/30  Days Discharge Instructions: Apply Mupirocin (Bactroban) as instructed Topical: Triamcinolone 2 x Per Week/30 Days Discharge Instructions: Apply Triamcinolone as directed Prim Dressing: Hydrofera Blue Ready Transfer Foam, 4x5 (in/in) 2 x Per Week/30 Days ary Discharge Instructions: Apply to wound bed as instructed Secondary Dressing: ABD Pad, 8x10 2 x Per Week/30 Days Discharge Instructions: Apply over primary dressing as directed. Secondary Dressing: Woven Gauze Sponge, Non-Sterile 4x4 in 2 x Per Week/30 Days Discharge Instructions: Apply over primary dressing as directed. Com pression Wrap: Urgo  K2 Lite, (equivalent to a 3 layer) two layer compression system, regular 2 x Per Week/30 Days Discharge Instructions: Apply Urgo K2 Lite as directed (alternative to 3 layer compression). 09/20/2023: The caudal satellite lesion appears to be closed. There is a proximal satellite lesion that is nearly closed underneath some eschar and slough. The main wound is fairly fibrotic but does have buds of granulation tissue emerging. Edema control is good. I used a curette to debride slough, eschar, and subcutaneous tissue from the proximal small wound and slough and subcutaneous tissue from the larger distal wound. We are currently using topical gentamicin and mupirocin for management of Pseudomonas and Enterococcus that were previously cultured. Continue Hydrofera Blue ready foam and Urgo light compression. Follow-up in 1 week. Electronic Signature(s) Signed: 09/20/2023 12:57:02 PM By: Marolyn Nest MD FACS Entered By: Marolyn Nest on 09/20/2023 12:28:38 -------------------------------------------------------------------------------- SuperBill Details Patient Name: Date of Service: IVIE FRANNE [name] 09/20/2023 Medical Record Number: [id number] Patient Account Number: [id number] Date of Birth/Sex: Treating RN: 06-10-1941 (82 y.o. M) Primary Care Provider: Corlis Pagan Other Clinician: Tussey, Beck W ([id number]) [id number] Page 9 of 9 Referring Provider: Treating Provider/Extender: Marolyn Nest Corlis Pagan Devra in Treatment: 7 Diagnosis Coding ICD-10 Codes Code Description [phone] Non-pressure chronic ulcer of other part of left lower leg with other specified severity I87.332 Chronic venous hypertension (idiopathic) with ulcer and inflammation of left lower extremity E11.51 Type 2 diabetes mellitus with diabetic peripheral angiopathy without gangrene Facility Procedures : CPT4 Code: [id number] Description: 11042 - DEB SUBQ TISSUE 20 SQ CM/< ICD-10 Diagnosis  Description L97.828 Non-pressure chronic ulcer of other part of left lower leg with other specified Modifier: severity Quantity: 1 Physician Procedures : CPT4 Code Description Modifier [id number] - WC PHYS LEVEL 4 - EST PT 25 ICD-10 Diagnosis Description L97.828 Non-pressure chronic ulcer of other part of left lower leg with other specified severity I87.332 Chronic venous hypertension (idiopathic)  with ulcer and inflammation of left lower extremity E11.51 Type 2 diabetes mellitus with diabetic peripheral angiopathy without gangrene Quantity: 1 : [id number] 11042 - WC PHYS SUBQ TISS 20 SQ CM ICD-10 Diagnosis Description L97.828 Non-pressure chronic ulcer of other part of left lower leg with other specified severity Quantity: 1 Electronic Signature(s) Signed: 09/20/2023 12:57:02 PM By: Marolyn Nest MD FACS Entered By: Marolyn Nest on 09/20/2023 12:29:06

## 2023-09-27 ENCOUNTER — Encounter (HOSPITAL_BASED_OUTPATIENT_CLINIC_OR_DEPARTMENT_OTHER): Payer: Medicare Other | Attending: Internal Medicine | Admitting: Internal Medicine

## 2023-09-27 DIAGNOSIS — I5032 Chronic diastolic (congestive) heart failure: Secondary | ICD-10-CM | POA: Insufficient documentation

## 2023-09-27 DIAGNOSIS — N189 Chronic kidney disease, unspecified: Secondary | ICD-10-CM | POA: Diagnosis not present

## 2023-09-27 DIAGNOSIS — I130 Hypertensive heart and chronic kidney disease with heart failure and stage 1 through stage 4 chronic kidney disease, or unspecified chronic kidney disease: Secondary | ICD-10-CM | POA: Insufficient documentation

## 2023-09-27 DIAGNOSIS — E1151 Type 2 diabetes mellitus with diabetic peripheral angiopathy without gangrene: Secondary | ICD-10-CM | POA: Insufficient documentation

## 2023-09-27 DIAGNOSIS — I87332 Chronic venous hypertension (idiopathic) with ulcer and inflammation of left lower extremity: Secondary | ICD-10-CM | POA: Diagnosis not present

## 2023-09-27 DIAGNOSIS — L97828 Non-pressure chronic ulcer of other part of left lower leg with other specified severity: Secondary | ICD-10-CM | POA: Diagnosis not present

## 2023-09-27 DIAGNOSIS — E1122 Type 2 diabetes mellitus with diabetic chronic kidney disease: Secondary | ICD-10-CM | POA: Diagnosis not present

## 2023-09-29 NOTE — Progress Notes (Signed)
 Mellen, Jaheim W ([id number]) [id number] Page 1 of 7 Visit Report for 09/27/2023 Chief Complaint Document Details Patient Name: Date of Service: [name], [name] 09/27/2023 1:45 PM Medical Record Number: [id number] Patient Account Number: [id number] Date of Birth/Sex: Treating RN: 02/18/1941 (83 y.o. M) Primary Care Provider: Corlis Pagan Other Clinician: Referring Provider: Treating Provider/Extender: Rosan Harlene Corlis Pagan Devra in Treatment: 8 Information Obtained from: Patient Chief Complaint 07/28/2023; patient is here for review of a sizable wound on the left lateral lower leg Electronic Signature(s) Signed: 09/28/2023 5:26:35 PM By: Rosan Harlene DO Entered By: Rosan Harlene on 09/27/2023 15:41:17 -------------------------------------------------------------------------------- HPI Details Patient Name: Date of Service: Frank [name] 09/27/2023 1:45 PM Medical Record Number: [id number] Patient Account Number: [id number] Date of Birth/Sex: Treating RN: Aug 09, 1941 (82 y.o. M) Primary Care Provider: Corlis Pagan Other Clinician: Referring Provider: Treating Provider/Extender: Rosan Harlene Corlis Pagan Devra in Treatment: 8 History of Present Illness HPI Description: ADMISSION 07/28/2023. This is an 83 year old subsomewhat frail man who arrived accompanied by his wife and daughter. He has a large chronic ulcer on the left lateral lower leg. He was hospitalized from 10/30 through 07/23/2023 with a nonhealing wound in the left leg felt to have coexistent cellulitis. He was seen in the hospital by Dr. Harden. Discharged on Duricef and Flagyl . He is supposed to follow-up with Dr. Harden next week. He also saw a vein and vascular and did not feel he needed any intervention. I note that he had previously been seen earlier this year by Dr. Oneil Brew at [facility]. Felt to have chronic venous hypertension and secondary lymphedema. At 1 point he  was discovered to have maggots in this wound. Using Xeroform and T elfa. He has home health going out to see him and there is supposed to come tomorrow. Past medical history includes paroxysmal atrial fibrillation, hypertension, type 2 diabetes, chronic kidney disease, known PAD, heart failure with preserved ejection fraction He had arterial studies done on 07/21/2023. On the right his ABI was 0.97 TBI of 0.48 but he had triphasic and wave biphasic waveforms. On the left his TBI was 0.61. I do not think anything further was done. 11/13; large chronic venous insufficiency wound on the left lateral lower leg with 2 small superior satellite lesions. We used Santyl  and Hydrofera Blue under and Urgo K2 light compression. He has some degree of PAD which is the reason for the Urgo K2 lite's. He seems to have tolerated this surprisingly well. He has completed his antibiotics 11/19; large chronic venous insufficiency wound on the left lateral lower leg with 2 small satellite lesions superiorly. We are using Santyl  and Hydrofera Blue under an Urgo K2 light compression. There is seems to been some confusion about the need for home health I think it would be really in the patient's best interest to have home health change this 1 time and we will do it 1 time in clinic per week 12/3; we are following this patient for a large chronic venous ulcers on the left lateral lower leg with 2 caudal satellite lesions. We have been using Santyl , TCA Hydrofera Blue under and Urgo K2 lite. We did a PCR culture on him 2 weeks ago that showed Pseudomonas and Enterococcus. I gave him a 10-day course of Levaquin which she completed. We had referred him for a Keystone prepared topical antibiotic combination however his wife complained about the $100 co- pay being unaffordable. Therefore that was not done. The wound is really completely unchanged today  12/10; large chronic venous ulcers on the left lateral leg with 2 caudal  satellite lesions. Last week we put gent and mupirocin on this under silver alginate then he appears to be doing somewhat better. Better looking granulation. We are using Urgo K2 light compression Lofaso, Orrie W ([id number]) [id number] Page 2 of 7 12/19; the area on the left lateral lower leg which is a large wound with 2 caudal satellite lesions seems to be contracting including the satellite lesions. We have been putting gent and mupirocin on the surface with silver alginate as the primary dressing under Urgo K2 light compression. The latter seems to be controlling his swelling 09/20/2023: The caudal satellite lesion appears to be closed. There is a proximal satellite lesion that is nearly closed underneath some eschar and slough. The main wound is fairly fibrotic but does have buds of granulation tissue emerging. Edema control is good. 09/27/2023; patient presents for follow-up. Patient has wounds to the left lower extremity. Patient is tolerating the compression wrap well. He has no issues or complaints. He denies signs of infection. Electronic Signature(s) Signed: 09/28/2023 5:26:35 PM By: Rosan Raisin DO Entered By: Rosan Raisin on 09/27/2023 15:42:53 -------------------------------------------------------------------------------- Physical Exam Details Patient Name: Date of Service: STOKES, RATTIGAN 09/27/2023 1:45 PM Medical Record Number: [id number] Patient Account Number: [id number] Date of Birth/Sex: Treating RN: 13-Apr-1941 (83 y.o. M) Primary Care Provider: Corlis Pagan Other Clinician: Referring Provider: Treating Provider/Extender: Rosan Raisin Corlis Pagan Weeks in Treatment: 8 Constitutional respirations regular, non-labored and within target range for patient.. Cardiovascular 2+ dorsalis pedis/posterior tibialis pulses. Psychiatric pleasant and cooperative. Notes T the left lower extremity on the lateral aspect there is a large open  wound with healthy granulation tissue throughout. Proximal to this is a small pinpoint o wound. No signs of infection. Decent edema control. Electronic Signature(s) Signed: 09/28/2023 5:26:35 PM By: Rosan Raisin DO Entered By: Rosan Raisin on 09/27/2023 15:43:35 -------------------------------------------------------------------------------- Physician Orders Details Patient Name: Date of Service: Frank [name] 09/27/2023 1:45 PM Medical Record Number: [id number] Patient Account Number: [id number] Date of Birth/Sex: Treating RN: 1940-12-15 (82 y.o. NETTY Lanis Maxwell Primary Care Provider: Corlis Pagan Other Clinician: Referring Provider: Treating Provider/Extender: Rosan Raisin Corlis Pagan Devra in Treatment: 8 Verbal / Phone Orders: No Diagnosis Coding Follow-up Appointments ppointment in 1 week. - Dr. Rosan Return A Anesthetic (In clinic) Topical Lidocaine  5% applied to wound bed JOHNTAVIUS, SHEPARD W ([id number]) [id number] Page 3 of 7 Bathing/ Shower/ Hygiene May shower with protection but do not get wound dressing(s) wet. Protect dressing(s) with water  repellant cover (for example, large plastic bag) or a cast cover and may then take shower. - Do not get the left leg wet. Keep the left leg dry until the next appointment. Edema Control - Orders / Instructions Elevate legs to the level of the heart or above for 30 minutes daily and/or when sitting for 3-4 times a day throughout the day. - As tolerated. Keep legs up when sitting/lying Avoid standing for long periods of time. Home Health New wound care orders this week; continue Home Health for wound care. May utilize formulary equivalent dressing for wound treatment orders unless otherwise specified. - TCA to periwound. Mupirocin and Gentamicin to wound bed to wound bed and Hydrafera Blue Ready over gent. and Mup. and then use cover wound -gauze,ABD pad and URGO K2 or 3 layer compression  wraps. Other Home Health Orders/Instructions: - [facility] fax:[phone] Wound Treatment Wound #1 - Lower Leg Wound Laterality: Left, Lateral,  Distal Cleanser: Soap and Water  2 x Per Week/30 Days Discharge Instructions: May shower and wash wound with dial antibacterial soap and water  prior to dressing change. Topical: Gentamicin 2 x Per Week/30 Days Discharge Instructions: As directed by physician Topical: Mupirocin Ointment 2 x Per Week/30 Days Discharge Instructions: Apply Mupirocin (Bactroban) as instructed Topical: Triamcinolone 2 x Per Week/30 Days Discharge Instructions: Apply Triamcinolone as directed Prim Dressing: Hydrofera Blue Ready Transfer Foam, 4x5 (in/in) 2 x Per Week/30 Days ary Discharge Instructions: Apply to wound bed as instructed Secondary Dressing: ABD Pad, 8x10 2 x Per Week/30 Days Discharge Instructions: Apply over primary dressing as directed. Secondary Dressing: Woven Gauze Sponge, Non-Sterile 4x4 in 2 x Per Week/30 Days Discharge Instructions: Apply over primary dressing as directed. Compression Wrap: Urgo K2 Lite, (equivalent to a 3 layer) two layer compression system, regular 2 x Per Week/30 Days Discharge Instructions: Apply Urgo K2 Lite as directed (alternative to 3 layer compression). Wound #2 - Lower Leg Wound Laterality: Left, Lateral, Proximal Cleanser: Soap and Water  2 x Per Week/30 Days Discharge Instructions: May shower and wash wound with dial antibacterial soap and water  prior to dressing change. Topical: Gentamicin 2 x Per Week/30 Days Discharge Instructions: As directed by physician Topical: Mupirocin Ointment 2 x Per Week/30 Days Discharge Instructions: Apply Mupirocin (Bactroban) as instructed Topical: Triamcinolone 2 x Per Week/30 Days Discharge Instructions: Apply Triamcinolone as directed Prim Dressing: Hydrofera Blue Ready Transfer Foam, 4x5 (in/in) 2 x Per Week/30 Days ary Discharge Instructions: Apply to wound bed as  instructed Secondary Dressing: ABD Pad, 8x10 2 x Per Week/30 Days Discharge Instructions: Apply over primary dressing as directed. Secondary Dressing: Woven Gauze Sponge, Non-Sterile 4x4 in 2 x Per Week/30 Days Discharge Instructions: Apply over primary dressing as directed. Compression Wrap: Urgo K2 Lite, (equivalent to a 3 layer) two layer compression system, regular 2 x Per Week/30 Days Discharge Instructions: Apply Urgo K2 Lite as directed (alternative to 3 layer compression). Electronic Signature(s) Signed: 09/28/2023 5:26:35 PM By: Rosan Raisin DO Entered By: Rosan Raisin on 09/27/2023 15:43:51 [name] ([id number]) [id number] Page 4 of 7 -------------------------------------------------------------------------------- Problem List Details Patient Name: Date of Service: DIERRE, [name] 09/27/2023 1:45 PM Medical Record Number: [id number] Patient Account Number: [id number] Date of Birth/Sex: Treating RN: 1941/09/18 (83 y.o. M) Primary Care Provider: Corlis Pagan Other Clinician: Referring Provider: Treating Provider/Extender: Rosan Raisin Corlis Pagan Devra in Treatment: 8 Active Problems ICD-10 Encounter Code Description Active Date MDM Diagnosis L97.828 Non-pressure chronic ulcer of other part of left lower leg with other specified 07/28/2023 No Yes severity I87.332 Chronic venous hypertension (idiopathic) with ulcer and inflammation of left 07/28/2023 No Yes lower extremity E11.51 Type 2 diabetes mellitus with diabetic peripheral angiopathy without gangrene 07/28/2023 No Yes Inactive Problems ICD-10 Code Description Active Date Inactive Date L03.116 Cellulitis of left lower limb 07/28/2023 07/28/2023 Resolved Problems Electronic Signature(s) Signed: 09/28/2023 5:26:35 PM By: Rosan Raisin DO Entered By: Rosan Raisin on 09/27/2023 15:41:04 -------------------------------------------------------------------------------- Progress  Note Details Patient Name: Date of Service: Frank [name] 09/27/2023 1:45 PM Medical Record Number: [id number] Patient Account Number: [id number] Date of Birth/Sex: Treating RN: [birth year] (83 y.o. M) Primary Care Provider: Corlis Pagan Other Clinician: Referring Provider: Treating Provider/Extender: Rosan Raisin Corlis Pagan Devra in Treatment: 8 Subjective Chief Complaint Information obtained from Patient 07/28/2023; patient is here for review of a sizable wound on the left lateral lower leg History of Present Illness (HPI) ADMISSION [name], [name] ([id number]) [id number] Page 5 of 7 07/28/2023.  This is an 83 year old subsomewhat frail man who arrived accompanied by his wife and daughter. He has a large chronic ulcer on the left lateral lower leg. He was hospitalized from 10/30 through 07/23/2023 with a nonhealing wound in the left leg felt to have coexistent cellulitis. He was seen in the hospital by Dr. Harden. Discharged on Duricef and Flagyl . He is supposed to follow-up with Dr. Harden next week. He also saw a vein and vascular and did not feel he needed any intervention. I note that he had previously been seen earlier this year by Dr. Oneil Brew at [facility]. Felt to have chronic venous hypertension and secondary lymphedema. At 1 point he was discovered to have maggots in this wound. Using Xeroform and T elfa. He has home health going out to see him and there is supposed to come tomorrow. Past medical history includes paroxysmal atrial fibrillation, hypertension, type 2 diabetes, chronic kidney disease, known PAD, heart failure with preserved ejection fraction He had arterial studies done on 07/21/2023. On the right his ABI was 0.97 TBI of 0.48 but he had triphasic and wave biphasic waveforms. On the left his TBI was 0.61. I do not think anything further was done. 11/13; large chronic venous insufficiency wound on the left lateral lower leg with  2 small superior satellite lesions. We used Santyl  and Hydrofera Blue under and Urgo K2 light compression. He has some degree of PAD which is the reason for the Urgo K2 lite's. He seems to have tolerated this surprisingly well. He has completed his antibiotics 11/19; large chronic venous insufficiency wound on the left lateral lower leg with 2 small satellite lesions superiorly. We are using Santyl  and Hydrofera Blue under an Urgo K2 light compression. There is seems to been some confusion about the need for home health I think it would be really in the patient's best interest to have home health change this 1 time and we will do it 1 time in clinic per week 12/3; we are following this patient for a large chronic venous ulcers on the left lateral lower leg with 2 caudal satellite lesions. We have been using Santyl , TCA Hydrofera Blue under and Urgo K2 lite. We did a PCR culture on him 2 weeks ago that showed Pseudomonas and Enterococcus. I gave him a 10-day course of Levaquin which she completed. We had referred him for a [facility] prepared topical antibiotic combination however his wife complained about the $100 co- pay being unaffordable. Therefore that was not done. The wound is really completely unchanged today 12/10; large chronic venous ulcers on the left lateral leg with 2 caudal satellite lesions. Last week we put gent and mupirocin on this under silver alginate then he appears to be doing somewhat better. Better looking granulation. We are using Urgo K2 light compression 12/19; the area on the left lateral lower leg which is a large wound with 2 caudal satellite lesions seems to be contracting including the satellite lesions. We have been putting gent and mupirocin on the surface with silver alginate as the primary dressing under Urgo K2 light compression. The latter seems to be controlling his swelling 09/20/2023: The caudal satellite lesion appears to be closed. There is a proximal satellite  lesion that is nearly closed underneath some eschar and slough. The main wound is fairly fibrotic but does have buds of granulation tissue emerging. Edema control is good. 09/27/2023; patient presents for follow-up. Patient has wounds to the left lower extremity. Patient is tolerating the compression  wrap well. He has no issues or complaints. He denies signs of infection. Objective Constitutional respirations regular, non-labored and within target range for patient.. Vitals Time Taken: 2:09 PM, Height: 64 in, Weight: 189.4 lbs, BMI: 32.5, Temperature: 97.9 F, Respiratory Rate: 18 breaths/min. Cardiovascular 2+ dorsalis pedis/posterior tibialis pulses. Psychiatric pleasant and cooperative. General Notes: T the left lower extremity on the lateral aspect there is a large open wound with healthy granulation tissue throughout. Proximal to this is a o small pinpoint wound. No signs of infection. Decent edema control. Integumentary (Hair, Skin) Wound #1 status is Open. Original cause of wound was Bump. The date acquired was: 06/13/2023. The wound has been in treatment 8 weeks. The wound is located on the Left,Distal,Lateral Lower Leg. The wound measures 5cm length x 2.5cm width x 0.1cm depth; 9.817cm^2 area and 0.982cm^3 volume. There is Fat Layer (Subcutaneous Tissue) exposed. There is no tunneling or undermining noted. There is a medium amount of serosanguineous drainage noted. The wound margin is distinct with the outline attached to the wound base. There is large (67-100%) red granulation within the wound bed. There is a small (1-33%) amount of necrotic tissue within the wound bed including Adherent Slough. The periwound skin appearance had no abnormalities noted for moisture. The periwound skin appearance exhibited: Scarring, Hemosiderin Staining. The periwound skin appearance did not exhibit: Callus, Crepitus, Excoriation, Induration, Rash, Atrophie Blanche, Cyanosis, Ecchymosis, Mottled, Pallor,  Rubor, Erythema. Periwound temperature was noted as No Abnormality. Wound #2 status is Open. Original cause of wound was Gradually Appeared. The date acquired was: 08/22/2023. The wound has been in treatment 5 weeks. The wound is located on the Left,Proximal,Lateral Lower Leg. The wound measures 0.1cm length x 0.1cm width x 0.1cm depth; 0.008cm^2 area and 0.001cm^3 volume. There is no tunneling or undermining noted. There is a medium amount of serosanguineous drainage noted. The wound margin is distinct with the outline attached to the wound base. There is large (67-100%) red granulation within the wound bed. There is no necrotic tissue within the wound bed. The periwound skin appearance had no abnormalities noted for moisture. The periwound skin appearance exhibited: Scarring, Hemosiderin Staining. The periwound skin appearance did not exhibit: Callus, Crepitus, Excoriation, Induration, Rash, Atrophie Blanche, Cyanosis, Ecchymosis, Mottled, Pallor, Rubor, Erythema. Periwound temperature was noted as No Abnormality. Assessment Deane, Jago W ([id number]) [id number] Page 6 of 7 A ctive Problems ICD-10 Non-pressure chronic ulcer of other part of left lower leg with other specified severity Chronic venous hypertension (idiopathic) with ulcer and inflammation of left lower extremity Type 2 diabetes mellitus with diabetic peripheral angiopathy without gangrene Patient's wound has improved in size and appearance since last clinic visit. I recommended continuing the course with antibiotic ointment and Hydrofera Blue under compression wrap. Follow-up in 1 week. Patient has home health who changes the dressing once weekly as well. Procedures Wound #1 Pre-procedure diagnosis of Wound #1 is a Venous Leg Ulcer located on the Left,Distal,Lateral Lower Leg . There was a Three Layer Compression Therapy Procedure by Lanis Maxwell, RN. Post procedure Diagnosis Wound #1: Same as  Pre-Procedure Wound #2 Pre-procedure diagnosis of Wound #2 is a Venous Leg Ulcer located on the Left,Proximal,Lateral Lower Leg . There was a Three Layer Compression Therapy Procedure by Lanis Maxwell, RN. Post procedure Diagnosis Wound #2: Same as Pre-Procedure Plan Follow-up Appointments: Return Appointment in 1 week. - Dr. Rosan Anesthetic: (In clinic) Topical Lidocaine  5% applied to wound bed Bathing/ Shower/ Hygiene: May shower with protection but do not get wound  dressing(s) wet. Protect dressing(s) with water  repellant cover (for example, large plastic bag) or a cast cover and may then take shower. - Do not get the left leg wet. Keep the left leg dry until the next appointment. Edema Control - Orders / Instructions: Elevate legs to the level of the heart or above for 30 minutes daily and/or when sitting for 3-4 times a day throughout the day. - As tolerated. Keep legs up when sitting/lying Avoid standing for long periods of time. Home Health: New wound care orders this week; continue Home Health for wound care. May utilize formulary equivalent dressing for wound treatment orders unless otherwise specified. - TCA to periwound. Mupirocin and Gentamicin to wound bed to wound bed and Hydrafera Blue Ready over gent. and Mup. and then use cover wound -gauze,ABD pad and URGO K2 or 3 layer compression wraps. Other Home Health Orders/Instructions: - [facility] fax:[phone] WOUND #1: - Lower Leg Wound Laterality: Left, Lateral, Distal Cleanser: Soap and Water  2 x Per Week/30 Days Discharge Instructions: May shower and wash wound with dial antibacterial soap and water  prior to dressing change. Topical: Gentamicin 2 x Per Week/30 Days Discharge Instructions: As directed by physician Topical: Mupirocin Ointment 2 x Per Week/30 Days Discharge Instructions: Apply Mupirocin (Bactroban) as instructed Topical: Triamcinolone 2 x Per Week/30 Days Discharge Instructions: Apply  Triamcinolone as directed Prim Dressing: Hydrofera Blue Ready Transfer Foam, 4x5 (in/in) 2 x Per Week/30 Days ary Discharge Instructions: Apply to wound bed as instructed Secondary Dressing: ABD Pad, 8x10 2 x Per Week/30 Days Discharge Instructions: Apply over primary dressing as directed. Secondary Dressing: Woven Gauze Sponge, Non-Sterile 4x4 in 2 x Per Week/30 Days Discharge Instructions: Apply over primary dressing as directed. Com pression Wrap: Urgo K2 Lite, (equivalent to a 3 layer) two layer compression system, regular 2 x Per Week/30 Days Discharge Instructions: Apply Urgo K2 Lite as directed (alternative to 3 layer compression). WOUND #2: - Lower Leg Wound Laterality: Left, Lateral, Proximal Cleanser: Soap and Water  2 x Per Week/30 Days Discharge Instructions: May shower and wash wound with dial antibacterial soap and water  prior to dressing change. Topical: Gentamicin 2 x Per Week/30 Days Discharge Instructions: As directed by physician Topical: Mupirocin Ointment 2 x Per Week/30 Days Discharge Instructions: Apply Mupirocin (Bactroban) as instructed Topical: Triamcinolone 2 x Per Week/30 Days Discharge Instructions: Apply Triamcinolone as directed Prim Dressing: Hydrofera Blue Ready Transfer Foam, 4x5 (in/in) 2 x Per Week/30 Days ary Discharge Instructions: Apply to wound bed as instructed Secondary Dressing: ABD Pad, 8x10 2 x Per Week/30 Days Discharge Instructions: Apply over primary dressing as directed. Secondary Dressing: Woven Gauze Sponge, Non-Sterile 4x4 in 2 x Per Week/30 Days Discharge Instructions: Apply over primary dressing as directed. Com pression Wrap: Urgo K2 Lite, (equivalent to a 3 layer) two layer compression system, regular 2 x Per Week/30 Days Discharge Instructions: Apply Urgo K2 Lite as directed (alternative to 3 layer compression). 1. Hydrofera Blue with antibiotic ointment under compression wrap to the left lower extremity. Kosiba, Draiden W ([id number])  [id number] Page 7 of 7 2. Follow-up in 1 week Electronic Signature(s) Signed: 09/28/2023 5:26:35 PM By: Rosan Raisin DO Entered By: Rosan Raisin on 09/27/2023 15:44:57 -------------------------------------------------------------------------------- SuperBill Details Patient Name: Date of Service: Frank FRANNE [name] 09/27/2023 Medical Record Number: [id number] Patient Account Number: [id number] Date of Birth/Sex: Treating RN: 1941-03-27 (82 y.o. NETTY Sever, Lauren Primary Care Provider: Corlis Pagan Other Clinician: Referring Provider: Treating Provider/Extender: Rosan Raisin Corlis Pagan Weeks in Treatment: 8  Diagnosis Coding ICD-10 Codes Code Description [phone] Non-pressure chronic ulcer of other part of left lower leg with other specified severity I87.332 Chronic venous hypertension (idiopathic) with ulcer and inflammation of left lower extremity E11.51 Type 2 diabetes mellitus with diabetic peripheral angiopathy without gangrene Facility Procedures : CPT4 Code: 63899838 Description: (Facility Use Only) 29581LT - APPLY MULTLAY COMPRS LWR LT LEG Modifier: Quantity: 1 Physician Procedures : CPT4 Code Description Modifier [id number] - WC PHYS LEVEL 3 - EST PT ICD-10 Diagnosis Description L97.828 Non-pressure chronic ulcer of other part of left lower leg with other specified severity I87.332 Chronic venous hypertension (idiopathic) with  ulcer and inflammation of left lower extremity E11.51 Type 2 diabetes mellitus with diabetic peripheral angiopathy without gangrene Quantity: 1 Electronic Signature(s) Signed: 09/28/2023 5:26:35 PM By: Rosan Raisin DO Entered By: Rosan Raisin on 09/27/2023 15:45:08

## 2023-10-04 ENCOUNTER — Encounter (HOSPITAL_BASED_OUTPATIENT_CLINIC_OR_DEPARTMENT_OTHER): Payer: Medicare Other | Admitting: Internal Medicine

## 2023-10-04 DIAGNOSIS — I87332 Chronic venous hypertension (idiopathic) with ulcer and inflammation of left lower extremity: Secondary | ICD-10-CM | POA: Diagnosis not present

## 2023-10-04 DIAGNOSIS — L97828 Non-pressure chronic ulcer of other part of left lower leg with other specified severity: Secondary | ICD-10-CM

## 2023-10-04 DIAGNOSIS — E1151 Type 2 diabetes mellitus with diabetic peripheral angiopathy without gangrene: Secondary | ICD-10-CM

## 2023-10-05 NOTE — Progress Notes (Signed)
 [place], [name] ([id number]) [id number] Page 1 of 7 Visit Report for 10/04/2023 Chief Complaint Document Details Patient Name: Date of Service: [name], [name]. 10/04/2023 1:15 PM Medical Record Number: [id number] Patient Account Number: [id number] Date of Birth/Sex: Treating RN: [birth year] (83 y.o. M) Primary Care Provider: Corlis [name] Other Clinician: Referring Provider: Treating Provider/Extender: [name] [name] [name] in Treatment: 9 Information Obtained from: Patient Chief Complaint 07/28/2023; patient is here for review of a sizable wound on the left lateral lower leg Electronic Signature(s) Signed: 10/04/2023 3:07:10 PM By: [name] Entered By: [name] on 10/04/2023 13:35:13 -------------------------------------------------------------------------------- HPI Details Patient Name: Date of Service: [name] FRANNE AZUCENA [name] 10/04/2023 1:15 PM Medical Record Number: [id number] Patient Account Number: [id number] Date of Birth/Sex: Treating RN: 02/12/1941 (83 y.o. M) Primary Care Provider: Corlis [name] Other Clinician: Referring Provider: Treating Provider/Extender: [name] [name] [name] in Treatment: 9 History of Present Illness HPI Description: ADMISSION 07/28/2023. This is an 83 year old subsomewhat frail man who arrived accompanied by his wife and daughter. He has a large chronic ulcer on the left lateral lower leg. He was hospitalized from 10/30 through 07/23/2023 with a nonhealing wound in the left leg felt to have coexistent cellulitis. He was seen in the hospital by Dr. Harden. Discharged on Duricef and Flagyl . He is supposed to follow-up with Dr. Harden next week. He also saw a vein and vascular and did not feel he needed any intervention. I note that he had previously been seen earlier this year by Dr. Oneil [name] at [facility]. Felt to have chronic venous hypertension and secondary lymphedema. At 1 point  he was discovered to have maggots in this wound. Using Xeroform and T elfa. He has home health going out to see him and there is supposed to come tomorrow. Past medical history includes paroxysmal atrial fibrillation, hypertension, type 2 diabetes, chronic kidney disease, known PAD, heart failure with preserved ejection fraction He had arterial studies done on 07/21/2023. On the right his ABI was 0.97 TBI of 0.48 but he had triphasic and wave biphasic waveforms. On the left his TBI was 0.61. I [name] not think anything further was done. 11/13; large chronic venous insufficiency wound on the left lateral lower leg with 2 small superior satellite lesions. We used Santyl  and Hydrofera Blue under and Urgo K2 light compression. He has some degree of PAD which is the reason for the Urgo K2 lite's. He seems to have tolerated this surprisingly well. He has completed his antibiotics 11/19; large chronic venous insufficiency wound on the left lateral lower leg with 2 small satellite lesions superiorly. We are using Santyl  and Hydrofera Blue under an Urgo K2 light compression. There is seems to been some confusion about the need for home health I think it would be really in the patient's best interest to have home health change this 1 time and we will [name] it 1 time in clinic per week 12/3; we are following this patient for a large chronic venous ulcers on the left lateral lower leg with 2 caudal satellite lesions. We have been using Santyl , TCA Hydrofera Blue under and Urgo K2 lite. We did a PCR culture on him 2 weeks ago that showed Pseudomonas and Enterococcus. I gave him a 10-day course of Levaquin which she completed. We had referred him for a Keystone prepared topical antibiotic combination however his wife complained about the $100 co- pay being unaffordable. Therefore that was not done. The wound is really completely unchanged today  12/10; large chronic venous ulcers on the left lateral leg with 2 caudal  satellite lesions. Last week we put gent and mupirocin on this under silver alginate then he appears to be doing somewhat better. Better looking granulation. We are using Urgo K2 light compression [name], [name] ([id number]) [id number] Page 2 of 7 12/19; the area on the left lateral lower leg which is a large wound with 2 caudal satellite lesions seems to be contracting including the satellite lesions. We have been putting gent and mupirocin on the surface with silver alginate as the primary dressing under Urgo K2 light compression. The latter seems to be controlling his swelling 09/20/2023: The caudal satellite lesion appears to be closed. There is a proximal satellite lesion that is nearly closed underneath some eschar and slough. The main wound is fairly fibrotic but does have buds of granulation tissue emerging. Edema control is good. 09/27/2023; patient presents for follow-up. Patient has wounds to the left lower extremity. Patient is tolerating the compression wrap well. He has no issues or complaints. He denies signs of infection. 10/04/2023; patient presents for follow-up. We have been using Hydrofera Blue and antibiotic ointment under compression therapy. Patient has no issues or complaints. Electronic Signature(s) Signed: 10/04/2023 3:07:10 PM By: [name] Entered By: [name] on 10/04/2023 13:37:47 -------------------------------------------------------------------------------- Physical Exam Details Patient Name: Date of Service: [name], [name] 10/04/2023 1:15 PM Medical Record Number: [id number] Patient Account Number: [id number] Date of Birth/Sex: Treating RN: 1940/10/31 (83 y.o. M) Primary Care Provider: Corlis [name] Other Clinician: Referring Provider: Treating Provider/Extender: [name] [name] [name] in Treatment: 9 Constitutional respirations regular, non-labored and within target range for  patient.. Cardiovascular 2+ dorsalis pedis/posterior tibialis pulses. Psychiatric pleasant and cooperative. Notes 1 wound remaining to the left lower extremity on the lateral aspect. Healthy granulation tissue throughout. No signs of surrounding infection. Decent edema control. Electronic Signature(s) Signed: 10/04/2023 3:07:10 PM By: [name] Entered By: [name] on 10/04/2023 13:38:41 -------------------------------------------------------------------------------- Physician Orders Details Patient Name: Date of Service: [name] FRANNE AZUCENA [name] 10/04/2023 1:15 PM Medical Record Number: [id number] Patient Account Number: [id number] Date of Birth/Sex: Treating RN: [birth year] (82 y.o. NETTY Lanis Maxwell Primary Care Provider: Corlis [name] Other Clinician: Referring Provider: Treating Provider/Extender: [name] [name] [name] in Treatment: 9 Verbal / Phone Orders: No Diagnosis Coding Follow-up Appointments ppointment in 1 week. - Dr. Rosan Return A Monday February 10th pt. can schedule appt. at 3:30 for right after Vascular Appt. Syfert, Saturnino [name] ([id number]) [id number] Page 3 of 7 Anesthetic (In clinic) Topical Lidocaine  5% applied to wound bed Bathing/ Shower/ Hygiene May shower with protection but [name] not get wound dressing(s) wet. Protect dressing(s) with water  repellant cover (for example, large plastic bag) or a cast cover and may then take shower. - [name] not get the left leg wet. Keep the left leg dry until the next appointment. Edema Control - Orders / Instructions Elevate legs to the level of the heart or above for 30 minutes daily and/or when sitting for 3-4 times a day throughout the day. - As tolerated. Keep legs up when sitting/lying Avoid standing for long periods of time. Home Health New wound care orders this week; continue Home Health for wound care. May utilize formulary equivalent dressing for wound  treatment orders unless otherwise specified. - TCA to periwound. Mupirocin and Gentamicin to wound bed to wound bed and Hydrafera Blue Ready over gent. and Mup. and then use cover wound -gauze,ABD pad and URGO K2  or 3 layer compression wraps. Other Home Health Orders/Instructions: - [facility] fax:[phone] Wound Treatment Wound #1 - Lower Leg Wound Laterality: Left, Lateral, Distal Cleanser: Soap and Water  2 x Per Week/30 Days Discharge Instructions: May shower and wash wound with dial antibacterial soap and water  prior to dressing change. Topical: Gentamicin 2 x Per Week/30 Days Discharge Instructions: As directed by physician Topical: Mupirocin Ointment 2 x Per Week/30 Days Discharge Instructions: Apply Mupirocin (Bactroban) as instructed Topical: Triamcinolone 2 x Per Week/30 Days Discharge Instructions: Apply Triamcinolone as directed Prim Dressing: Hydrofera Blue Ready Transfer Foam, 4x5 (in/in) 2 x Per Week/30 Days ary Discharge Instructions: Apply to wound bed as instructed Secondary Dressing: ABD Pad, 8x10 2 x Per Week/30 Days Discharge Instructions: Apply over primary dressing as directed. Secondary Dressing: Woven Gauze Sponge, Non-Sterile 4x4 in 2 x Per Week/30 Days Discharge Instructions: Apply over primary dressing as directed. Compression Wrap: Urgo K2 Lite, (equivalent to a 3 layer) two layer compression system, regular 2 x Per Week/30 Days Discharge Instructions: Apply Urgo K2 Lite as directed (alternative to 3 layer compression). Electronic Signature(s) Signed: 10/04/2023 3:07:10 PM By: [name] Entered By: [name] on 10/04/2023 13:38:54 -------------------------------------------------------------------------------- Problem List Details Patient Name: Date of Service: [name] 10/04/2023 1:15 PM Medical Record Number: [id number] Patient Account Number: [id number] Date of Birth/Sex: Treating RN: 12-25-1940 (82 y.o. M) Primary  Care Provider: Corlis [name] Other Clinician: Referring Provider: Treating Provider/Extender: [name] [name] [name] in Treatment: 9 Active Problems ICD-10 Encounter Code Description Active Date MDM Diagnosis L97.828 Non-pressure chronic ulcer of other part of left lower leg with other specified 07/28/2023 No Yes severity Hook, Davidson [name] ([id number]) [phone].pdf Page 4 of 7 [phone] Chronic venous hypertension (idiopathic) with ulcer and inflammation of left 07/28/2023 No Yes lower extremity E11.51 Type 2 diabetes mellitus with diabetic peripheral angiopathy without gangrene 07/28/2023 No Yes Inactive Problems ICD-10 Code Description Active Date Inactive Date L03.116 Cellulitis of left lower limb 07/28/2023 07/28/2023 Resolved Problems Electronic Signature(s) Signed: 10/04/2023 3:07:10 PM By: [name] Entered By: [name] on 10/04/2023 13:35:00 -------------------------------------------------------------------------------- Progress Note Details Patient Name: Date of Service: [name] 10/04/2023 1:15 PM Medical Record Number: [id number] Patient Account Number: [id number] Date of Birth/Sex: Treating RN: 1941/01/22 (82 y.o. M) Primary Care Provider: Corlis [name] Other Clinician: Referring Provider: Treating Provider/Extender: [name] [name] [name] in Treatment: 9 Subjective Chief Complaint Information obtained from Patient 07/28/2023; patient is here for review of a sizable wound on the left lateral lower leg History of Present Illness (HPI) ADMISSION 07/28/2023. This is an 82 year old subsomewhat frail man who arrived accompanied by his wife and daughter. He has a large chronic ulcer on the left lateral lower leg. He was hospitalized from 10/30 through 07/23/2023 with a nonhealing wound in the left leg felt to have coexistent cellulitis. He was seen in the hospital by Dr. Harden. Discharged on Duricef and  Flagyl . He is supposed to follow-up with Dr. Harden next week. He also saw a vein and vascular and did not feel he needed any intervention. I note that he had previously been seen earlier this year by Dr. Oneil [name] at [facility]. Felt to have chronic venous hypertension and secondary lymphedema. At 1 point he was discovered to have maggots in this wound. Using Xeroform and T elfa. He has home health going out to see him and there is supposed to come tomorrow. Past medical history includes paroxysmal atrial fibrillation, hypertension, type 2  diabetes, chronic kidney disease, known PAD, heart failure with preserved ejection fraction He had arterial studies done on 07/21/2023. On the right his ABI was 0.97 TBI of 0.48 but he had triphasic and wave biphasic waveforms. On the left his TBI was 0.61. I [name] not think anything further was done. 11/13; large chronic venous insufficiency wound on the left lateral lower leg with 2 small superior satellite lesions. We used Santyl  and Hydrofera Blue under and Urgo K2 light compression. He has some degree of PAD which is the reason for the Urgo K2 lite's. He seems to have tolerated this surprisingly well. He has completed his antibiotics 11/19; large chronic venous insufficiency wound on the left lateral lower leg with 2 small satellite lesions superiorly. We are using Santyl  and Hydrofera Blue under an Urgo K2 light compression. There is seems to been some confusion about the need for home health I think it would be really in the patient's best interest to have home health change this 1 time and we will [name] it 1 time in clinic per week 12/3; we are following this patient for a large chronic venous ulcers on the left lateral lower leg with 2 caudal satellite lesions. We have been using Santyl , TCA Hydrofera Blue under and Urgo K2 lite. We did a PCR culture on him 2 weeks ago that showed Pseudomonas and Enterococcus. I gave him a 10-day course of Levaquin  which she completed. We had referred him for a [facility] prepared topical antibiotic combination however his wife complained about the $100 co- pay being unaffordable. Therefore that was not done. The wound is really completely unchanged today 12/10; large chronic venous ulcers on the left lateral leg with 2 caudal satellite lesions. Last week we put gent and mupirocin on this under silver alginate then he appears to be doing somewhat better. Better looking granulation. We are using Urgo K2 light compression Kizziah, Faustino [name] ([id number]) [id number] Page 5 of 7 12/19; the area on the left lateral lower leg which is a large wound with 2 caudal satellite lesions seems to be contracting including the satellite lesions. We have been putting gent and mupirocin on the surface with silver alginate as the primary dressing under Urgo K2 light compression. The latter seems to be controlling his swelling 09/20/2023: The caudal satellite lesion appears to be closed. There is a proximal satellite lesion that is nearly closed underneath some eschar and slough. The main wound is fairly fibrotic but does have buds of granulation tissue emerging. Edema control is good. 09/27/2023; patient presents for follow-up. Patient has wounds to the left lower extremity. Patient is tolerating the compression wrap well. He has no issues or complaints. He denies signs of infection. 10/04/2023; patient presents for follow-up. We have been using Hydrofera Blue and antibiotic ointment under compression therapy. Patient has no issues or complaints. Objective Constitutional respirations regular, non-labored and within target range for patient.. Vitals Time Taken: 1:13 PM, Height: 64 in, Weight: 189.4 lbs, BMI: 32.5, Temperature: 97.7 F, Pulse: 52 bpm, Respiratory Rate: 17 breaths/min, Blood Pressure: 210/96 mmHg. Cardiovascular 2+ dorsalis pedis/posterior tibialis pulses. Psychiatric pleasant and  cooperative. General Notes: 1 wound remaining to the left lower extremity on the lateral aspect. Healthy granulation tissue throughout. No signs of surrounding infection. Decent edema control. Integumentary (Hair, Skin) Wound #1 status is Open. Original cause of wound was Bump. The date acquired was: 06/13/2023. The wound has been in treatment 9 weeks. The wound is located on the Left,Distal,Lateral Lower Leg. The  wound measures 4.8cm length x 2cm width x 0.1cm depth; 7.54cm^2 area and 0.754cm^3 volume. There is Fat Layer (Subcutaneous Tissue) exposed. There is no tunneling or undermining noted. There is a medium amount of serosanguineous drainage noted. The wound margin is distinct with the outline attached to the wound base. There is large (67-100%) red granulation within the wound bed. There is a small (1-33%) amount of necrotic tissue within the wound bed. The periwound skin appearance had no abnormalities noted for moisture. The periwound skin appearance exhibited: Scarring, Hemosiderin Staining. The periwound skin appearance did not exhibit: Callus, Crepitus, Excoriation, Induration, Rash, Atrophie Blanche, Cyanosis, Ecchymosis, Mottled, Pallor, Rubor, Erythema. Periwound temperature was noted as No Abnormality. Wound #2 status is Healed - Epithelialized. Original cause of wound was Gradually Appeared. The date acquired was: 08/22/2023. The wound has been in treatment 6 weeks. The wound is located on the Left,Proximal,Lateral Lower Leg. The wound measures 0cm length x 0cm width x 0cm depth; 0cm^2 area and 0cm^3 volume. There is a medium amount of serosanguineous drainage noted. Assessment Active Problems ICD-10 Non-pressure chronic ulcer of other part of left lower leg with other specified severity Chronic venous hypertension (idiopathic) with ulcer and inflammation of left lower extremity Type 2 diabetes mellitus with diabetic peripheral angiopathy without gangrene Patient's wound has shown  improvement in size and appearance since last clinic visit. I recommended continuing the course with Hydrofera Blue, antibiotic ointment under compression therapy. Procedures Wound #1 Pre-procedure diagnosis of Wound #1 is a Venous Leg Ulcer located on the Left,Distal,Lateral Lower Leg . There was a Three Layer Compression Therapy Procedure by Lanis Maxwell, RN. Post procedure Diagnosis Wound #1: Same as Pre-Procedure Plan Dembeck, Kervens [name] ([id number]) [id number] Page 6 of 7 Follow-up Appointments: Return Appointment in 1 week. - Dr. Rosan Monday February 10th pt. can schedule appt. at 3:30 for right after Vascular Appt. Anesthetic: (In clinic) Topical Lidocaine  5% applied to wound bed Bathing/ Shower/ Hygiene: May shower with protection but [name] not get wound dressing(s) wet. Protect dressing(s) with water  repellant cover (for example, large plastic bag) or a cast cover and may then take shower. - [name] not get the left leg wet. Keep the left leg dry until the next appointment. Edema Control - Orders / Instructions: Elevate legs to the level of the heart or above for 30 minutes daily and/or when sitting for 3-4 times a day throughout the day. - As tolerated. Keep legs up when sitting/lying Avoid standing for long periods of time. Home Health: New wound care orders this week; continue Home Health for wound care. May utilize formulary equivalent dressing for wound treatment orders unless otherwise specified. - TCA to periwound. Mupirocin and Gentamicin to wound bed to wound bed and Hydrafera Blue Ready over gent. and Mup. and then use cover wound -gauze,ABD pad and URGO K2 or 3 layer compression wraps. Other Home Health Orders/Instructions: - [facility] fax:[phone] WOUND #1: - Lower Leg Wound Laterality: Left, Lateral, Distal Cleanser: Soap and Water  2 x Per Week/30 Days Discharge Instructions: May shower and wash wound with dial antibacterial  soap and water  prior to dressing change. Topical: Gentamicin 2 x Per Week/30 Days Discharge Instructions: As directed by physician Topical: Mupirocin Ointment 2 x Per Week/30 Days Discharge Instructions: Apply Mupirocin (Bactroban) as instructed Topical: Triamcinolone 2 x Per Week/30 Days Discharge Instructions: Apply Triamcinolone as directed Prim Dressing: Hydrofera Blue Ready Transfer Foam, 4x5 (in/in) 2 x Per Week/30 Days ary Discharge Instructions: Apply to wound bed as instructed  Secondary Dressing: ABD Pad, 8x10 2 x Per Week/30 Days Discharge Instructions: Apply over primary dressing as directed. Secondary Dressing: Woven Gauze Sponge, Non-Sterile 4x4 in 2 x Per Week/30 Days Discharge Instructions: Apply over primary dressing as directed. Com pression Wrap: Urgo K2 Lite, (equivalent to a 3 layer) two layer compression system, regular 2 x Per Week/30 Days Discharge Instructions: Apply Urgo K2 Lite as directed (alternative to 3 layer compression). 1. Hydrofera Blue with antibiotic ointment under compression wrap to the left lower extremity. 2. Follow-up in 1 week Electronic Signature(s) Signed: 10/04/2023 3:07:10 PM By: [name] Entered By: [name] on 10/04/2023 13:40:16 -------------------------------------------------------------------------------- SuperBill Details Patient Name: Date of Service: [name] 10/04/2023 Medical Record Number: [id number] Patient Account Number: [id number] Date of Birth/Sex: Treating RN: 1941-03-30 (82 y.o. NETTY Sever, Lauren Primary Care Provider: Corlis [name] Other Clinician: Referring Provider: Treating Provider/Extender: [name] [name] [name] in Treatment: 9 Diagnosis Coding ICD-10 Codes Code Description [phone] Non-pressure chronic ulcer of other part of left lower leg with other specified severity I87.332 Chronic venous hypertension (idiopathic) with ulcer and inflammation of left lower  extremity E11.51 Type 2 diabetes mellitus with diabetic peripheral angiopathy without gangrene Facility Procedures : CPT4 Code: 63899838 ( Description: Facility Use Only) [phone] - APPLY MULTLAY COMPRS LWR LT LEG Modifier: Quantity: 1 Physician Procedures : CPT4 Code Description Modifier [id number] - WC PHYS LEVEL 3 - EST PT DVAUGHN, FICKLE [name] ([id number]) [id number] ICD-10 Diagnosis Description L97.828 Non-pressure chronic ulcer of other part of left lower leg with other  specified severity I87.332 Chronic venous hypertension (idiopathic) with ulcer and inflammation of left lower extremity E11.51 Type 2 diabetes mellitus with diabetic peripheral angiopathy without gangrene Quantity: 1 Page 7 of 7 Electronic Signature(s) Signed: 10/04/2023 3:07:10 PM By: [name] Entered By: [name] on 10/04/2023 13:40:36

## 2023-10-05 NOTE — Progress Notes (Signed)
 [name] ([id number]) [id number] Page 1 of 9 Visit Report for 09/27/2023 Arrival Information Details Patient Name: Date of Service: [name], [name] 09/27/2023 1:45 PM Medical Record Number: [id number] Patient Account Number: [id number] Date of Birth/Sex: Treating RN: 05-09-1941 (83 y.o. M) Primary Care Jaeanna Mccomber: Corlis Pagan Other Clinician: Referring Ellis Mehaffey: Treating Shasta Chinn/Extender: Rosan Harlene Corlis Pagan Devra in Treatment: 8 Visit Information History Since Last Visit Added or deleted any medications: No Patient Arrived: Ambulatory Any new allergies or adverse reactions: No Arrival Time: 14:08 Had a fall or experienced change in No Accompanied By: wife activities of daily living that may affect Transfer Assistance: None risk of falls: Patient Identification Verified: Yes Signs or symptoms of abuse/neglect since last visito No Secondary Verification Process Completed: Yes Hospitalized since last visit: No Patient Requires Transmission-Based Precautions: No Implantable device outside of the clinic excluding No Patient Has Alerts: Yes cellular tissue based products placed in the center Patient Alerts: ABI R 0.97 since last visit: ABI L 0.61 Has Dressing in Place as Prescribed: Yes TBI 0.46 Has Compression in Place as Prescribed: Yes Pain Present Now: No Electronic Signature(s) Signed: 10/03/2023 5:23:50 PM By: Wyn Iha Entered By: Wyn Iha on 09/27/2023 14:09:28 -------------------------------------------------------------------------------- Compression Therapy Details Patient Name: Date of Service: [name] FRANNE AZUCENA [name] 09/27/2023 1:45 PM Medical Record Number: [id number] Patient Account Number: [id number] Date of Birth/Sex: Treating RN: [birth year] (82 y.o. [name] Primary Care Ryley Teater: Corlis Pagan Other Clinician: Referring Jandel Patriarca: Treating Labrea Eccleston/Extender: Rosan Harlene Corlis Pagan Weeks in Treatment:  8 Compression Therapy Performed for Wound Assessment: Wound #1 Left,Distal,Lateral Lower Leg Performed By: Clinician Lanis Maxwell, RN Compression Type: Three Layer Post Procedure Diagnosis Same as Pre-procedure Electronic Signature(s) Signed: 09/27/2023 4:20:27 PM By: Lanis Maxwell RN Entered By: Lanis [name] on 09/27/2023 14:44:42 Jia, Graham [name] ([id number]) [id number] Page 2 of 9 -------------------------------------------------------------------------------- Compression Therapy Details Patient Name: Date of Service: [name] 09/27/2023 1:45 PM Medical Record Number: [id number] Patient Account Number: [id number] Date of Birth/Sex: Treating RN: [birth year] (83 y.o. [name] Primary Care Toluwani Yadav: Corlis Pagan Other Clinician: Referring Lennie Dunnigan: Treating Illeana Edick/Extender: Rosan Harlene Corlis Pagan Weeks in Treatment: 8 Compression Therapy Performed for Wound Assessment: Wound #2 Left,Proximal,Lateral Lower Leg Performed By: Clinician Lanis Maxwell, RN Compression Type: Three Layer Post Procedure Diagnosis Same as Pre-procedure Electronic Signature(s) Signed: 09/27/2023 4:20:27 PM By: Lanis Maxwell RN Entered By: Lanis [name] on 09/27/2023 14:44:42 -------------------------------------------------------------------------------- Encounter Discharge Information Details Patient Name: Date of Service: [name] FRANNE AZUCENA [name] 09/27/2023 1:45 PM Medical Record Number: [id number] Patient Account Number: [id number] Date of Birth/Sex: Treating RN: 04-Feb-1941 (83 y.o. [name] Primary Care Markanthony Gedney: Corlis Pagan Other Clinician: Referring Arman Loy: Treating Tora Prunty/Extender: Rosan Harlene Corlis Pagan Devra in Treatment: 8 Encounter Discharge Information Items Discharge Condition: Stable Ambulatory Status: Ambulatory Discharge Destination: Home Transportation: Private Auto Accompanied By: wife Schedule  Follow-up Appointment: Yes Clinical Summary of Care: Patient Declined Electronic Signature(s) Signed: 09/27/2023 2:49:58 PM By: Lanis Maxwell RN Entered By: Lanis [name] on 09/27/2023 14:49:58 -------------------------------------------------------------------------------- Lower Extremity Assessment Details Patient Name: Date of Service: [name] FRANNE AZUCENA [name] 09/27/2023 1:45 PM Medical Record Number: [id number] Patient Account Number: [id number] Date of Birth/Sex: Treating RN: 02-21-1941 (83 y.o. M) Primary Care Nolin Grell: Corlis Pagan Other Clinician: Referring Ravina Milner: Treating Bailyn Spackman/Extender: Rosan Harlene Corlis Pagan Weeks in Treatment: 8 Edema Assessment Assessed: [Left: No] [Right: No] Edema: [Left: Ye] [Right: s] Calf DEAVEON, SCHOEN ([id number]) [id number] Page 3 of 9 Left: Right: Point of Measurement: 30 cm From Medial  Instep 38.3 cm Ankle Left: Right: Point of Measurement: 12 cm From Medial Instep 22 cm Vascular Assessment Extremity colors, hair growth, and conditions: Extremity Color: [Left:Red] Hair Growth on Extremity: [Left:No] Temperature of Extremity: [Left:Warm] Capillary Refill: [Left:< 3 seconds] Dependent Rubor: [Left:No Yes] Electronic Signature(s) Signed: 10/03/2023 5:23:50 PM By: Wyn Iha Entered By: Wyn Iha on 09/27/2023 14:11:12 -------------------------------------------------------------------------------- Multi Wound Chart Details Patient Name: Date of Service: [name] FRANNE REGULUS [name] 09/27/2023 1:45 PM Medical Record Number: [id number] Patient Account Number: [id number] Date of Birth/Sex: Treating RN: 1941-07-06 (82 y.o. M) Primary Care Sherita Decoste: Corlis Pagan Other Clinician: Referring Alexandria Shiflett: Treating Ahlijah Raia/Extender: Rosan Harlene Corlis Pagan Weeks in Treatment: 8 Vital Signs Height(in): 64 Pulse(bpm): Weight(lbs): 189.4 Blood Pressure(mmHg): Body Mass Index(BMI): 32.5 Temperature(F):  97.9 Respiratory Rate(breaths/min): 18 [1:Photos:] [N/A:N/A] Left, Distal, Lateral Lower Leg Left, Proximal, Lateral Lower Leg N/A Wound Location: Bump Gradually Appeared N/A Wounding Event: Venous Leg Ulcer Venous Leg Ulcer N/A Primary Etiology: Cataracts, Arrhythmia, Congestive Cataracts, Arrhythmia, Congestive N/A Comorbid History: Heart Failure, Hypertension, Gout Heart Failure, Hypertension, Gout 06/13/2023 08/22/2023 N/A Date Acquired: 8 5 N/A Weeks of Treatment: Open Open N/A Wound Status: No No N/A Wound Recurrence: No Yes N/A Clustered Wound: N/A 1 N/A Clustered Quantity: 5x2.5x0.1 0.1x0.1x0.1 N/A Measurements L x [name] x D (cm) 9.817 0.008 N/A A (cm) : rea 0.982 0.001 N/A Volume (cm) : 88.60% 99.80% N/A % Reduction in Area: 97.10% 99.80% N/A % Reduction in Volume: Full Thickness Without Exposed Full Thickness Without Exposed N/A Classification: Support Structures Support Structures Medium Medium N/A Exudate AmountAUTHUR, CUBIT ([id number]) [id number] Page 4 of 9 Serosanguineous Serosanguineous N/A Exudate Type: red, brown red, brown N/A Exudate Color: Distinct, outline attached Distinct, outline attached N/A Wound Margin: Large (67-100%) Large (67-100%) N/A Granulation Amount: Red Red N/A Granulation Quality: Small (1-33%) None Present (0%) N/A Necrotic Amount: Fat Layer (Subcutaneous Tissue): Yes Fascia: No N/A Exposed Structures: Fascia: No Fat Layer (Subcutaneous Tissue): No Tendon: No Tendon: No Muscle: No Muscle: No Joint: No Joint: No Bone: No Bone: No Small (1-33%) Large (67-100%) N/A Epithelialization: Scarring: Yes Scarring: Yes N/A Periwound Skin Texture: Excoriation: No Excoriation: No Induration: No Induration: No Callus: No Callus: No Crepitus: No Crepitus: No Rash: No Rash: No Maceration: No Maceration: No N/A Periwound Skin Moisture: Dry/Scaly: No Dry/Scaly: No Hemosiderin Staining:  Yes Hemosiderin Staining: Yes N/A Periwound Skin Color: Atrophie Blanche: No Atrophie Blanche: No Cyanosis: No Cyanosis: No Ecchymosis: No Ecchymosis: No Erythema: No Erythema: No Mottled: No Mottled: No Pallor: No Pallor: No Rubor: No Rubor: No No Abnormality No Abnormality N/A Temperature: Compression Therapy Compression Therapy N/A Procedures Performed: Treatment Notes Wound #1 (Lower Leg) Wound Laterality: Left, Lateral, Distal Cleanser Soap and Water  Discharge Instruction: May shower and wash wound with dial antibacterial soap and water  prior to dressing change. Peri-Wound Care Topical Gentamicin Discharge Instruction: As directed by physician Mupirocin Ointment Discharge Instruction: Apply Mupirocin (Bactroban) as instructed Triamcinolone Discharge Instruction: Apply Triamcinolone as directed Primary Dressing Hydrofera Blue Ready Transfer Foam, 4x5 (in/in) Discharge Instruction: Apply to wound bed as instructed Secondary Dressing ABD Pad, 8x10 Discharge Instruction: Apply over primary dressing as directed. Woven Gauze Sponge, Non-Sterile 4x4 in Discharge Instruction: Apply over primary dressing as directed. Secured With Compression Wrap Urgo K2 Lite, (equivalent to a 3 layer) two layer compression system, regular Discharge Instruction: Apply Urgo K2 Lite as directed (alternative to 3 layer compression). Compression Stockings Add-Ons Wound #2 (Lower Leg) Wound Laterality: Left, Lateral, Proximal Cleanser Soap and Water  Discharge Instruction:  May shower and wash wound with dial antibacterial soap and water  prior to dressing change. Peri-Wound Care Topical HOOPER, PETTEWAY ([id number]) [id number] Page 5 of 9 Gentamicin Discharge Instruction: As directed by physician Mupirocin Ointment Discharge Instruction: Apply Mupirocin (Bactroban) as instructed Triamcinolone Discharge Instruction: Apply Triamcinolone as directed Primary  Dressing Hydrofera Blue Ready Transfer Foam, 4x5 (in/in) Discharge Instruction: Apply to wound bed as instructed Secondary Dressing ABD Pad, 8x10 Discharge Instruction: Apply over primary dressing as directed. Woven Gauze Sponge, Non-Sterile 4x4 in Discharge Instruction: Apply over primary dressing as directed. Secured With Compression Wrap Urgo K2 Lite, (equivalent to a 3 layer) two layer compression system, regular Discharge Instruction: Apply Urgo K2 Lite as directed (alternative to 3 layer compression). Compression Stockings Add-Ons Electronic Signature(s) Signed: 09/28/2023 5:26:35 PM By: Rosan Raisin DO Entered By: Rosan Raisin on 09/27/2023 15:41:09 -------------------------------------------------------------------------------- Multi-Disciplinary Care Plan Details Patient Name: Date of Service: [name] FRANNE AZUCENA [name] 09/27/2023 1:45 PM Medical Record Number: [id number] Patient Account Number: [id number] Date of Birth/Sex: Treating RN: 15-Jun-1941 (82 y.o. [name] Primary Care Monae Topping: Corlis Pagan Other Clinician: Referring Zaivion Kundrat: Treating Keslyn Teater/Extender: Rosan Raisin Corlis Pagan Weeks in Treatment: 8 Active Inactive Wound/Skin Impairment Nursing Diagnoses: Impaired tissue integrity Goals: Patient/caregiver will verbalize understanding of skin care regimen Date Initiated: 07/28/2023 Target Resolution Date: 04/12/2024 Goal Status: Active Interventions: Assess ulceration(s) every visit Treatment Activities: Skin care regimen initiated : 07/28/2023 Notes: Electronic Signature(s) Signed: 09/27/2023 4:20:27 PM By: Lanis Maxwell RN Entered By: Lanis [name] on 09/27/2023 14:43:59 ROZANNA AZUCENA ORN ([id number]) [id number] Page 6 of 9 -------------------------------------------------------------------------------- Pain Assessment Details Patient Name: Date of Service: [name], [name] 09/27/2023 1:45 PM Medical Record  Number: [id number] Patient Account Number: [id number] Date of Birth/Sex: Treating RN: 1941-09-14 (83 y.o. M) Primary Care Karishma Unrein: Corlis Pagan Other Clinician: Referring Isabel Freese: Treating Maciah Schweigert/Extender: Rosan Raisin Corlis Pagan Devra in Treatment: 8 Active Problems Location of Pain Severity and Description of Pain Patient Has Paino No Site Locations Pain Management and Medication Current Pain Management: Electronic Signature(s) Signed: 10/03/2023 5:23:50 PM By: Wyn Iha Entered By: Wyn Iha on 09/27/2023 14:10:39 -------------------------------------------------------------------------------- Patient/Caregiver Education Details Patient Name: Date of Service: [name] FRANNE AZUCENA MICAEL 1/7/2025andnbsp1:45 PM Medical Record Number: [id number] Patient Account Number: [id number] Date of Birth/Gender: Treating RN: 07/02/1941 (83 y.o. [name] Primary Care Physician: Corlis Pagan Other Clinician: Referring Physician: Treating Physician/Extender: Rosan Raisin Corlis Pagan Devra in Treatment: 8 Education Assessment Education Provided To: Patient Education Topics Provided Wound/Skin Impairment: Methods: Explain/Verbal ELZA, SORTOR ([id number]) [id number] Page 7 of 9 Responses: Reinforcements needed, State content correctly Electronic Signature(s) Signed: 09/27/2023 4:20:27 PM By: Lanis Maxwell RN Entered By: Lanis [name] on 09/27/2023 14:44:10 -------------------------------------------------------------------------------- Wound Assessment Details Patient Name: Date of Service: [name] FRANNE AZUCENA [name] 09/27/2023 1:45 PM Medical Record Number: [id number] Patient Account Number: [id number] Date of Birth/Sex: Treating RN: 31-Jul-1941 (82 y.o. M) Primary Care Arraya Buck: Corlis Pagan Other Clinician: Referring Perl Folmar: Treating Anet Logsdon/Extender: Rosan Raisin Corlis Pagan Weeks in Treatment: 8 Wound Status Wound Number: 1  Primary Venous Leg Ulcer Etiology: Wound Location: Left, Distal, Lateral Lower Leg Wound Status: Open Wounding Event: Bump Comorbid Cataracts, Arrhythmia, Congestive Heart Failure, Date Acquired: 06/13/2023 History: Hypertension, Gout Weeks Of Treatment: 8 Clustered Wound: No Photos Wound Measurements Length: (cm) 5 Width: (cm) 2.5 Depth: (cm) 0.1 Area: (cm) 9.817 Volume: (cm) 0.982 % Reduction in Area: 88.6% % Reduction in Volume: 97.1% Epithelialization: Small (1-33%) Tunneling: No Undermining: No Wound Description Classification: Full Thickness Without Exposed Support Structures Wound Margin: Distinct,  outline attached Exudate Amount: Medium Exudate Type: Serosanguineous Exudate Color: red, brown Foul Odor After Cleansing: No Slough/Fibrino Yes Wound Bed Granulation Amount: Large (67-100%) Exposed Structure Granulation Quality: Red Fascia Exposed: No Necrotic Amount: Small (1-33%) Fat Layer (Subcutaneous Tissue) Exposed: Yes Necrotic Quality: Adherent Slough Tendon Exposed: No Muscle Exposed: No Joint Exposed: No Bone Exposed: No Periwound Skin Texture Texture Color No Abnormalities Noted: No No Abnormalities Noted: No Callus: No [address]BRILEY, SULTON ([id number]) [id number] Page 8 of 9 Crepitus: No Cyanosis: No Excoriation: No Ecchymosis: No Induration: No Erythema: No Rash: No Hemosiderin Staining: Yes Scarring: Yes Mottled: No Pallor: No Moisture Rubor: No No Abnormalities Noted: Yes Temperature / Pain Temperature: No Abnormality Electronic Signature(s) Signed: 09/27/2023 4:20:27 PM By: Lanis Maxwell RN Entered By: Lanis [name] on 09/27/2023 14:12:50 -------------------------------------------------------------------------------- Wound Assessment Details Patient Name: Date of Service: [name] FRANNE AZUCENA LELON. 09/27/2023 1:45 PM Medical Record Number: [id number] Patient Account Number: [id number] Date of  Birth/Sex: Treating RN: March 11, 1941 (83 y.o. M) Primary Care Janielle Mittelstadt: Corlis Pagan Other Clinician: Referring Bird Swetz: Treating Tay Whitwell/Extender: Rosan Harlene Corlis Pagan Weeks in Treatment: 8 Wound Status Wound Number: 2 Primary Venous Leg Ulcer Etiology: Wound Location: Left, Proximal, Lateral Lower Leg Wound Status: Open Wounding Event: Gradually Appeared Comorbid Cataracts, Arrhythmia, Congestive Heart Failure, Date Acquired: 08/22/2023 History: Hypertension, Gout Weeks Of Treatment: 5 Clustered Wound: Yes Photos Wound Measurements Length: (cm) Width: (cm) Depth: (cm) Clustered Quantity: Area: (cm) Volume: (cm) 0.1 % Reduction in Area: 99.8% 0.1 % Reduction in Volume: 99.8% 0.1 Epithelialization: Large (67-100%) 1 Tunneling: No 0.008 Undermining: No 0.001 Wound Description Classification: Full Thickness Without Exposed Sup Wound Margin: Distinct, outline attached Exudate Amount: Medium Exudate Type: Serosanguineous Exudate Color: red, brown port Structures Foul Odor After Cleansing: No Slough/Fibrino Yes Wound Bed Granulation Amount: Large (67-100%) Exposed Structure Granulation Quality: Red Fascia Exposed: No Necrotic Amount: None Present (0%) Fat Layer (Subcutaneous Tissue) Exposed: No Tendon Exposed: No AARION, [name] ([id number]) [id number] Page 9 of 9 Muscle Exposed: No Joint Exposed: No Bone Exposed: No Periwound Skin Texture Texture Color No Abnormalities Noted: No No Abnormalities Noted: No Callus: No Atrophie Blanche: No Crepitus: No Cyanosis: No Excoriation: No Ecchymosis: No Induration: No Erythema: No Rash: No Hemosiderin Staining: Yes Scarring: Yes Mottled: No Pallor: No Moisture Rubor: No No Abnormalities Noted: Yes Temperature / Pain Temperature: No Abnormality Electronic Signature(s) Signed: 09/27/2023 4:20:27 PM By: Lanis Maxwell RN Entered By: Lanis [name] on 09/27/2023  14:13:12 -------------------------------------------------------------------------------- Vitals Details Patient Name: Date of Service: [name] FRANNE AZUCENA [name] 09/27/2023 1:45 PM Medical Record Number: [id number] Patient Account Number: [id number] Date of Birth/Sex: Treating RN: 13-Mar-1941 (83 y.o. M) Primary Care Donnah Levert: Corlis Pagan Other Clinician: Referring Lily Velasquez: Treating Jaque Dacy/Extender: Rosan Harlene Corlis Pagan Devra in Treatment: 8 Vital Signs Time Taken: 14:09 Temperature (F): 97.9 Height (in): 64 Respiratory Rate (breaths/min): 18 Weight (lbs): 189.4 Reference Range: 80 - 120 mg / dl Body Mass Index (BMI): 32.5 Electronic Signature(s) Signed: 10/03/2023 5:23:50 PM By: Wyn Iha Entered By: Wyn Iha on 09/27/2023 14:10:31

## 2023-10-10 ENCOUNTER — Encounter (HOSPITAL_BASED_OUTPATIENT_CLINIC_OR_DEPARTMENT_OTHER): Payer: Medicare Other | Admitting: Internal Medicine

## 2023-10-10 DIAGNOSIS — E1151 Type 2 diabetes mellitus with diabetic peripheral angiopathy without gangrene: Secondary | ICD-10-CM | POA: Diagnosis not present

## 2023-10-10 DIAGNOSIS — I87332 Chronic venous hypertension (idiopathic) with ulcer and inflammation of left lower extremity: Secondary | ICD-10-CM | POA: Diagnosis not present

## 2023-10-10 DIAGNOSIS — L97828 Non-pressure chronic ulcer of other part of left lower leg with other specified severity: Secondary | ICD-10-CM

## 2023-10-10 NOTE — Progress Notes (Signed)
LAVALE, SENDER ([id number]) [id number] Page 1 of 7 Visit Report for 10/10/2023 Chief Complaint Document Details Patient Name: Date of Service: [name], [name] 10/10/2023 1:15 PM Medical Record Number: [id number] Patient Account Number: [id number] Date of Birth/Sex: Treating RN: [birth year] (83 y.o. M) Primary Care Provider: Linus Galas Other Clinician: Referring Provider: Treating Provider/Extender: Chauncey Mann in Treatment: 10 Information Obtained from: Patient Chief Complaint 07/28/2023; patient is here for review of a sizable wound on the left lateral lower leg Electronic Signature(s) Signed: 10/10/2023 3:23:55 PM By: Geralyn Corwin DO Entered By: Geralyn Corwin on 10/10/2023 13:51:10 -------------------------------------------------------------------------------- HPI Details Patient Name: Date of Service: Frank Musa W. 10/10/2023 1:15 PM Medical Record Number: [id number] Patient Account Number: [id number] Date of Birth/Sex: Treating RN: 09-Mar-1941 (83 y.o. M) Primary Care Provider: Linus Galas Other Clinician: Referring Provider: Treating Provider/Extender: Chauncey Mann in Treatment: 10 History of Present Illness HPI Description: ADMISSION 07/28/2023. This is an 83 year old subsomewhat frail man who arrived accompanied by his wife and daughter. He has a large chronic ulcer on the left lateral lower leg. He was hospitalized from 10/30 through 07/23/2023 with a nonhealing wound in the left leg felt to have coexistent cellulitis. He was seen in the hospital by Dr. Lajoyce Corners. Discharged on Duricef and Flagyl. He is supposed to follow-up with Dr. Lajoyce Corners next week. He also saw a vein and vascular and did not feel he needed any intervention. I note that he had previously been seen earlier this year by Dr. [facility] at [facility]. Felt to have chronic venous hypertension and secondary lymphedema. At 1  point he was discovered to have maggots in this wound. Using Xeroform and T elfa. He has home health going out to see him and there is supposed to come tomorrow. Past medical history includes paroxysmal atrial fibrillation, hypertension, type 2 diabetes, chronic kidney disease, known PAD, heart failure with preserved ejection fraction He had arterial studies done on 07/21/2023. On the right his ABI was 0.97 TBI of 0.48 but he had triphasic and wave biphasic waveforms. On the left his TBI was 0.61. I do not think anything further was done. 11/13; large chronic venous insufficiency wound on the left lateral lower leg with 2 small superior satellite lesions. We used Santyl and Hydrofera Blue under and Urgo K2 light compression. He has some degree of PAD which is the reason for the Urgo K2 lite's. He seems to have tolerated this surprisingly well. He has completed his antibiotics 11/19; large chronic venous insufficiency wound on the left lateral lower leg with 2 small satellite lesions superiorly. We are using Santyl and Hydrofera Blue under an Urgo K2 light compression. There is seems to been some confusion about the need for home health I think it would be really in the patient's best interest to have home health change this 1 time and we will do it 1 time in clinic per week 12/3; we are following this patient for a large chronic venous ulcers on the left lateral lower leg with 2 caudal satellite lesions. We have been using Santyl, TCA Hydrofera Blue under and Urgo K2 lite. We did a PCR culture on him 2 [name] ago that showed Pseudomonas and Enterococcus. I gave him a 10-day course of Levaquin which she completed. We had referred him for a Key[facility] prepared topical antibiotic combination however his wife complained about the $100 co- pay being unaffordable. Therefore that was not done. The wound is really completely unchanged today  12/10; large chronic venous ulcers on the left lateral leg with 2  caudal satellite lesions. Last week we put gent and mupirocin on this under silver alginate then he appears to be doing somewhat better. Better looking granulation. We are using Urgo K2 light compression [name], [name] ([id number]) [id number] Page 2 of 7 12/19; the area on the left lateral lower leg which is a large wound with 2 caudal satellite lesions seems to be contracting including the satellite lesions. We have been putting gent and mupirocin on the surface with silver alginate as the primary dressing under Urgo K2 light compression. The latter seems to be controlling his swelling 09/20/2023: The caudal satellite lesion appears to be closed. There is a proximal satellite lesion that is nearly closed underneath some eschar and slough. The main wound is fairly fibrotic but does have buds of granulation tissue emerging. Edema control is good. 09/27/2023; patient presents for follow-up. Patient has wounds to the left lower extremity. Patient is tolerating the compression wrap well. He has no issues or complaints. He denies signs of infection. 10/04/2023; patient presents for follow-up. We have been using Hydrofera Blue and antibiotic ointment under compression therapy. Patient has no issues or complaints. 10/10/2023; patient presents for follow-up. We have been using Hydrofera Blue and antibiotic ointment under compression therapy. Wound is smaller. The wrap slid down to the mid shin. Other than the patient has no issues or complaints today. Electronic Signature(s) Signed: 10/10/2023 3:23:55 PM By: Geralyn Corwin DO Entered By: Geralyn Corwin on 10/10/2023 13:51:55 -------------------------------------------------------------------------------- Physical Exam Details Patient Name: Date of Service: [name], [name] 10/10/2023 1:15 PM Medical Record Number: [id number] Patient Account Number: [id number] Date of Birth/Sex: Treating RN: 1941/06/12 (83 y.o. M) Primary  Care Provider: Linus Galas Other Clinician: Referring Provider: Treating Provider/Extender: [name] in Treatment: 10 Constitutional respirations regular, non-labored and within target range for patient.. Cardiovascular 2+ dorsalis pedis/posterior tibialis pulses. Psychiatric pleasant and cooperative. Notes Wound to the left lateral lower extremity with granulation tissue throughout. No signs of surrounding infection. Decent edema control. Electronic Signature(s) Signed: 10/10/2023 3:23:55 PM By: Geralyn Corwin DO Entered By: Geralyn Corwin on 10/10/2023 13:52:30 -------------------------------------------------------------------------------- Physician Orders Details Patient Name: Date of Service: [name], [name] 10/10/2023 1:15 PM Medical Record Number: [id number] Patient Account Number: [id number] Date of Birth/Sex: Treating RN: June 11, 1941 (82 y.o. [name] Primary Care Provider: Linus Galas Other Clinician: Referring Provider: Treating Provider/Extender: Chauncey Mann in Treatment: 10 The following information was scribed by: Shawn Stall The information was scribed for: Geralyn Corwin Verbal / Phone Orders: No Diagnosis Coding BEAUDEN, HUFFINE ([id number]) [id number] Page 3 of 7 ICD-10 Coding Code Description [id number] Non-pressure chronic ulcer of other part of left lower leg with other specified severity I87.332 Chronic venous hypertension (idiopathic) with ulcer and inflammation of left lower extremity E11.51 Type 2 diabetes mellitus with diabetic peripheral angiopathy without gangrene Follow-up Appointments ppointment in 1 week. - Dr. Mikey Bussing 10/18/2023 115pm (already scheduled) Return A Monday February 10th pt. can schedule appt. at 3:30 for right after Vascular Appt. Anesthetic (In clinic) Topical Lidocaine 5% applied to wound bed Bathing/ Shower/ Hygiene May shower with  protection but do not get wound dressing(s) wet. Protect dressing(s) with water repellant cover (for example, large plastic bag) or a cast cover and may then take shower. - Do not get the left leg wet. Keep the left leg dry until the next appointment. Edema Control - Orders / Instructions Elevate legs  to the level of the heart or above for 30 minutes daily and/or when sitting for 3-4 times a day throughout the day. - As tolerated. Keep legs up when sitting/lying Avoid standing for long periods of time. Home Health New wound care orders this week; continue Home Health for wound care. May utilize formulary equivalent dressing for wound treatment orders unless otherwise specified. - adding unna boot first layer to upper portion of lower leg to secure wrap in place. TCA to periwound. Mupirocin and Gentamicin to wound bed to wound bed and Hydrafera Blue Ready over gent. and Mup. and then use cover wound - gauze,ABD pad and URGO K2 or 3 layer compression wraps. Other Home Health Orders/Instructions: - [facility] fax:[phone] Wound Treatment Wound #1 - Lower Leg Wound Laterality: Left, Lateral, Distal Cleanser: Soap and Water 2 x Per Week/30 Days Discharge Instructions: May shower and wash wound with dial antibacterial soap and water prior to dressing change. Topical: Gentamicin 2 x Per Week/30 Days Discharge Instructions: As directed by physician Topical: Mupirocin Ointment 2 x Per Week/30 Days Discharge Instructions: Apply Mupirocin (Bactroban) as instructed Topical: Triamcinolone 2 x Per Week/30 Days Discharge Instructions: Apply Triamcinolone as directed Prim Dressing: Hydrofera Blue Ready Transfer Foam, 4x5 (in/in) 2 x Per Week/30 Days ary Discharge Instructions: Apply to wound bed as instructed Secondary Dressing: ABD Pad, 8x10 2 x Per Week/30 Days Discharge Instructions: Apply over primary dressing as directed. Secondary Dressing: Woven Gauze Sponge, Non-Sterile 4x4 in 2 x  Per Week/30 Days Discharge Instructions: Apply over primary dressing as directed. Compression Wrap: Urgo K2 Lite, (equivalent to a 3 layer) two layer compression system, regular 2 x Per Week/30 Days Discharge Instructions: Apply Urgo K2 Lite as directed (alternative to 3 layer compression). ****add first layer of unna boot to upper portion of lower leg. Electronic Signature(s) Signed: 10/10/2023 3:23:55 PM By: Geralyn Corwin DO Entered By: Geralyn Corwin on 10/10/2023 13:53:34 -------------------------------------------------------------------------------- Problem List Details Patient Name: Date of Service: Frank Musa W. 10/10/2023 1:15 PM Medical Record Number: [id number] Patient Account Number: [id number] Date of Birth/Sex: Treating RN: 1941-09-11 (83 y.o. [name] Primary Care Provider: Linus Galas Other Clinician: [name] ([id number]) [id number] Page 4 of 7 Referring Provider: Treating Provider/Extender: Chauncey Mann in Treatment: 10 Active Problems ICD-10 Encounter Code Description Active Date MDM Diagnosis L97.828 Non-pressure chronic ulcer of other part of left lower leg with other specified 07/28/2023 No Yes severity I87.332 Chronic venous hypertension (idiopathic) with ulcer and inflammation of left 07/28/2023 No Yes lower extremity E11.51 Type 2 diabetes mellitus with diabetic peripheral angiopathy without gangrene 07/28/2023 No Yes Inactive Problems ICD-10 Code Description Active Date Inactive Date L03.116 Cellulitis of left lower limb 07/28/2023 07/28/2023 Resolved Problems Electronic Signature(s) Signed: 10/10/2023 3:23:55 PM By: Geralyn Corwin DO Entered By: Geralyn Corwin on 10/10/2023 13:50:55 -------------------------------------------------------------------------------- Progress Note Details Patient Name: Date of Service: Frank Musa W. 10/10/2023 1:15 PM Medical Record Number:  [id number] Patient Account Number: [id number] Date of Birth/Sex: Treating RN: [birth year] (83 y.o. M) Primary Care Provider: Linus Galas Other Clinician: Referring Provider: Treating Provider/Extender: Chauncey Mann in Treatment: 10 Subjective Chief Complaint Information obtained from Patient 07/28/2023; patient is here for review of a sizable wound on the left lateral lower leg History of Present Illness (HPI) ADMISSION 07/28/2023. This is an 83 year old subsomewhat frail man who arrived accompanied by his wife and daughter. He has a large chronic ulcer on the left lateral lower leg. He was hospitalized  from 10/30 through 07/23/2023 with a nonhealing wound in the left leg felt to have coexistent cellulitis. He was seen in the hospital by Dr. Lajoyce Corners. Discharged on Duricef and Flagyl. He is supposed to follow-up with Dr. Lajoyce Corners next week. He also saw a vein and vascular and did not feel he needed any intervention. I note that he had previously been seen earlier this year by Dr. Shepard [facility] at [facility]. Felt to have chronic venous hypertension and secondary lymphedema. At 1 point he was discovered to have maggots in this wound. Using Xeroform and T elfa. He has home health going out to see him and there is supposed to come tomorrow. Past medical history includes paroxysmal atrial fibrillation, hypertension, type 2 diabetes, chronic kidney disease, known PAD, heart failure with preserved ejection fraction He had arterial studies done on 07/21/2023. On the right his ABI was 0.97 TBI of 0.48 but he had triphasic and wave biphasic waveforms. On the left his TBI was 0.61. I do not think anything further was done. [name], [name] ([id number]) [id number] Page 5 of 7 11/13; large chronic venous insufficiency wound on the left lateral lower leg with 2 small superior satellite lesions. We used Santyl and Hydrofera Blue under and Urgo K2 light  compression. He has some degree of PAD which is the reason for the Urgo K2 lite's. He seems to have tolerated this surprisingly well. He has completed his antibiotics 11/19; large chronic venous insufficiency wound on the left lateral lower leg with 2 small satellite lesions superiorly. We are using Santyl and Hydrofera Blue under an Urgo K2 light compression. There is seems to been some confusion about the need for home health I think it would be really in the patient's best interest to have home health change this 1 time and we will do it 1 time in clinic per week 12/3; we are following this patient for a large chronic venous ulcers on the left lateral lower leg with 2 caudal satellite lesions. We have been using Santyl, TCA Hydrofera Blue under and Urgo K2 lite. We did a PCR culture on him 2 [name] ago that showed Pseudomonas and Enterococcus. I gave him a 10-day course of Levaquin which she completed. We had referred him for a [facility] prepared topical antibiotic combination however his wife complained about the $100 co- pay being unaffordable. Therefore that was not done. The wound is really completely unchanged today 12/10; large chronic venous ulcers on the left lateral leg with 2 caudal satellite lesions. Last week we put gent and mupirocin on this under silver alginate then he appears to be doing somewhat better. Better looking granulation. We are using Urgo K2 light compression 12/19; the area on the left lateral lower leg which is a large wound with 2 caudal satellite lesions seems to be contracting including the satellite lesions. We have been putting gent and mupirocin on the surface with silver alginate as the primary dressing under Urgo K2 light compression. The latter seems to be controlling his swelling 09/20/2023: The caudal satellite lesion appears to be closed. There is a proximal satellite lesion that is nearly closed underneath some eschar and slough. The main wound is fairly  fibrotic but does have buds of granulation tissue emerging. Edema control is good. 09/27/2023; patient presents for follow-up. Patient has wounds to the left lower extremity. Patient is tolerating the compression wrap well. He has no issues or complaints. He denies signs of infection. 10/04/2023; patient presents for follow-up. We have been  using Hydrofera Blue and antibiotic ointment under compression therapy. Patient has no issues or complaints. 10/10/2023; patient presents for follow-up. We have been using Hydrofera Blue and antibiotic ointment under compression therapy. Wound is smaller. The wrap slid down to the mid shin. Other than the patient has no issues or complaints today. Objective Constitutional respirations regular, non-labored and within target range for patient.. Vitals Time Taken: 1:23 PM, Height: 64 in, Weight: 189.4 lbs, BMI: 32.5, Temperature: 98.2 F, Pulse: 54 bpm, Respiratory Rate: 18 breaths/min. Cardiovascular 2+ dorsalis pedis/posterior tibialis pulses. Psychiatric pleasant and cooperative. General Notes: Wound to the left lateral lower extremity with granulation tissue throughout. No signs of surrounding infection. Decent edema control. Integumentary (Hair, Skin) Wound #1 status is Open. Original cause of wound was Bump. The date acquired was: 06/13/2023. The wound has been in treatment 10 [name] The wound is located on the Left,Distal,Lateral Lower Leg. The wound measures 4.1cm length x 1.6cm width x 0.1cm depth; 5.152cm^2 area and 0.515cm^3 volume. There is Fat Layer (Subcutaneous Tissue) exposed. There is no tunneling or undermining noted. There is a medium amount of serosanguineous drainage noted. The wound margin is distinct with the outline attached to the wound base. There is large (67-100%) red granulation within the wound bed. There is a small (1-33%) amount of necrotic tissue within the wound bed including Adherent Slough. The periwound skin appearance had no  abnormalities noted for moisture. The periwound skin appearance exhibited: Scarring, Hemosiderin Staining. The periwound skin appearance did not exhibit: Callus, Crepitus, Excoriation, Induration, Rash, Atrophie Blanche, Cyanosis, Ecchymosis, Mottled, Pallor, Rubor, Erythema. Periwound temperature was noted as No Abnormality. Assessment Active Problems ICD-10 Non-pressure chronic ulcer of other part of left lower leg with other specified severity Chronic venous hypertension (idiopathic) with ulcer and inflammation of left lower extremity Type 2 diabetes mellitus with diabetic peripheral angiopathy without gangrene Patient's wound has shown improvement in size and appearance since last clinic visit. No signs of infection. I recommended continue with Hydrofera Blue under compression wrap. We will add an Unna boot at the top to help keep this in place as the wrap slid down last week. Procedures [name], [name] ([id number]) [id number] Page 6 of 7 Wound #1 Pre-procedure diagnosis of Wound #1 is a Venous Leg Ulcer located on the Left,Distal,Lateral Lower Leg . There was a Double Layer Compression Therapy Procedure by Shawn Stall, RN. Post procedure Diagnosis Wound #1: Same as Pre-Procedure Plan Follow-up Appointments: Return Appointment in 1 week. - Dr. Mikey Bussing 10/18/2023 115pm (already scheduled) Monday February 10th pt. can schedule appt. at 3:30 for right after Vascular Appt. Anesthetic: (In clinic) Topical Lidocaine 5% applied to wound bed Bathing/ Shower/ Hygiene: May shower with protection but do not get wound dressing(s) wet. Protect dressing(s) with water repellant cover (for example, large plastic bag) or a cast cover and may then take shower. - Do not get the left leg wet. Keep the left leg dry until the next appointment. Edema Control - Orders / Instructions: Elevate legs to the level of the heart or above for 30 minutes daily and/or when sitting for 3-4  times a day throughout the day. - As tolerated. Keep legs up when sitting/lying Avoid standing for long periods of time. Home Health: New wound care orders this week; continue Home Health for wound care. May utilize formulary equivalent dressing for wound treatment orders unless otherwise specified. - adding unna boot first layer to upper portion of lower leg to secure wrap in place. TCA to periwound. Mupirocin and  Gentamicin to wound bed to wound bed and Hydrafera Blue Ready over gent. and Mup. and then use cover wound -gauze,ABD pad and URGO K2 or 3 layer compression wraps. Other Home Health Orders/Instructions: - [facility] fax:[phone] WOUND #1: - Lower Leg Wound Laterality: Left, Lateral, Distal Cleanser: Soap and Water 2 x Per Week/30 Days Discharge Instructions: May shower and wash wound with dial antibacterial soap and water prior to dressing change. Topical: Gentamicin 2 x Per Week/30 Days Discharge Instructions: As directed by physician Topical: Mupirocin Ointment 2 x Per Week/30 Days Discharge Instructions: Apply Mupirocin (Bactroban) as instructed Topical: Triamcinolone 2 x Per Week/30 Days Discharge Instructions: Apply Triamcinolone as directed Prim Dressing: Hydrofera Blue Ready Transfer Foam, 4x5 (in/in) 2 x Per Week/30 Days ary Discharge Instructions: Apply to wound bed as instructed Secondary Dressing: ABD Pad, 8x10 2 x Per Week/30 Days Discharge Instructions: Apply over primary dressing as directed. Secondary Dressing: Woven Gauze Sponge, Non-Sterile 4x4 in 2 x Per Week/30 Days Discharge Instructions: Apply over primary dressing as directed. Com pression Wrap: Urgo K2 Lite, (equivalent to a 3 layer) two layer compression system, regular 2 x Per Week/30 Days Discharge Instructions: Apply Urgo K2 Lite as directed (alternative to 3 layer compression). ****add first layer of unna boot to upper portion of lower leg. 1. Hydrofera Blue with antibiotic ointment  under compression therapy to the left lower extremity 2. Follow-up in 1 week Electronic Signature(s) Signed: 10/10/2023 3:23:55 PM By: Geralyn Corwin DO Entered By: Geralyn Corwin on 10/10/2023 13:55:07 -------------------------------------------------------------------------------- SuperBill Details Patient Name: Date of Service: [name] 10/10/2023 Medical Record Number: [id number] Patient Account Number: [id number] Date of Birth/Sex: Treating RN: [birth year] (82 y.o. [name] Primary Care Provider: Linus Galas Other Clinician: Referring Provider: Treating Provider/Extender: Chauncey Mann in Treatment: 10 Diagnosis Coding ICD-10 Codes Code Description [phone] Non-pressure chronic ulcer of other part of left lower leg with other specified severity I87.332 Chronic venous hypertension (idiopathic) with ulcer and inflammation of left lower extremity JASHUN, EMRICK ([id number]) [id number] Page 7 of 7 E11.51 Type 2 diabetes mellitus with diabetic peripheral angiopathy without gangrene Facility Procedures : CPT4 Code: [id number] Description: (Facility Use Only) [phone] - APPLY MULTLAY COMPRS LWR LT LEG Modifier: Quantity: 1 Physician Procedures : CPT4 Code Description Modifier [id number] - WC PHYS LEVEL 3 - EST PT ICD-10 Diagnosis Description L97.828 Non-pressure chronic ulcer of other part of left lower leg with other specified severity I87.332 Chronic venous hypertension (idiopathic) with  ulcer and inflammation of left lower extremity E11.51 Type 2 diabetes mellitus with diabetic peripheral angiopathy without gangrene Quantity: 1 Electronic Signature(s) Signed: 10/10/2023 3:23:55 PM By: Geralyn Corwin DO Entered By: Geralyn Corwin on 10/10/2023 13:55:18

## 2023-10-11 NOTE — Progress Notes (Signed)
[name] ([id number]) [id number] Page 1 of 8 Visit Report for 10/10/2023 Arrival Information Details Patient Name: Date of Service: [name], [name] 10/10/2023 1:15 PM Medical Record Number: [id number] Patient Account Number: [id number] Date of Birth/Sex: Treating RN: 23-Jan-1941 (83 y.o. M) Primary Care Garrick Midgley: Linus Galas Other Clinician: Referring Janneth Krasner: Treating Cynthie Garmon/Extender: Chauncey Mann in Treatment: 10 Visit Information History Since Last Visit Added or deleted any medications: No Patient Arrived: Ambulatory Any new allergies or adverse reactions: No Arrival Time: 13:10 Had a fall or experienced change in No Accompanied By: wife activities of daily living that may affect Transfer Assistance: None risk of falls: Patient Identification Verified: Yes Signs or symptoms of abuse/neglect since last visito No Secondary Verification Process Completed: Yes Hospitalized since last visit: No Patient Requires Transmission-Based Precautions: No Implantable device outside of the clinic excluding No Patient Has Alerts: Yes cellular tissue based products placed in the center Patient Alerts: ABI R 0.97 since last visit: ABI L 0.61 Has Dressing in Place as Prescribed: Yes TBI 0.46 Has Compression in Place as Prescribed: Yes Pain Present Now: No Electronic Signature(s) Signed: 10/11/2023 4:04:36 PM By: Thayer Dallas Entered By: Thayer Dallas on 10/10/2023 13:12:37 -------------------------------------------------------------------------------- Compression Therapy Details Patient Name: Date of Service: [name] 10/10/2023 1:15 PM Medical Record Number: [id number] Patient Account Number: [id number] Date of Birth/Sex: Treating RN: [birth year] (83 y.o. [name] Primary Care Jezlyn Westerfield: Linus Galas Other Clinician: Referring Willson Lipa: Treating Rogina Schiano/Extender: Boris Lown Weeks in Treatment:  10 Compression Therapy Performed for Wound Assessment: Wound #1 Left,Distal,Lateral Lower Leg Performed By: Clinician Shawn Stall, RN Compression Type: Double Layer Post Procedure Diagnosis Same as Pre-procedure Electronic Signature(s) Signed: 10/10/2023 2:44:18 PM By: Shawn Stall RN, BSN Entered By: Shawn Stall on 10/10/2023 13:47:19 [name] ([id number]) [id number] Page 2 of 8 -------------------------------------------------------------------------------- Encounter Discharge Information Details Patient Name: Date of Service: [name] 10/10/2023 1:15 PM Medical Record Number: [id number] Patient Account Number: [id number] Date of Birth/Sex: Treating RN: 1941-01-07 (83 y.o. [name] Primary Care Braelynn Lupton: Linus Galas Other Clinician: Referring Orra Nolde: Treating Oakley Kossman/Extender: Chauncey Mann in Treatment: 10 Encounter Discharge Information Items Discharge Condition: Stable Ambulatory Status: Cane Discharge Destination: Home Transportation: Private Auto Accompanied By: spouse Schedule Follow-up Appointment: Yes Clinical Summary of Care: Electronic Signature(s) Signed: 10/10/2023 2:44:18 PM By: Shawn Stall RN, BSN Entered By: Shawn Stall on 10/10/2023 13:50:06 -------------------------------------------------------------------------------- Lower Extremity Assessment Details Patient Name: Date of Service: [name] 10/10/2023 1:15 PM Medical Record Number: [id number] Patient Account Number: [id number] Date of Birth/Sex: Treating RN: 1941-05-05 (83 y.o. M) Primary Care Buckley Bradly: Linus Galas Other Clinician: Referring Tipton Ballow: Treating Vester Titsworth/Extender: Boris Lown Weeks in Treatment: 10 Edema Assessment Assessed: [Left: No] [Right: No] Edema: [Left: Ye] [Right: s] Calf Left: Right: Point of Measurement: 30 cm From Medial Instep 38.2 cm Ankle Left: Right: Point of  Measurement: 12 cm From Medial Instep 22 cm Vascular Assessment Extremity colors, hair growth, and conditions: Extremity Color: [Left:Red] Hair Growth on Extremity: [Left:No] Temperature of Extremity: [Left:Warm] Capillary Refill: [Left:< 3 seconds] Dependent Rubor: [Left:No Yes] Toe Nail Assessment Left: Right: Thick: No Discolored: Yes Deformed: Yes Improper Length and Hygiene: Yes Electronic Signature(s) Signed: 10/11/2023 4:04:36 PM By: Herbie Drape, Brantley Stage W ([id number]) [id number] Page 3 of 8 Entered By: Thayer Dallas on 10/10/2023 13:22:34 -------------------------------------------------------------------------------- Multi Wound Chart Details Patient Name: Date of Service: [name] 10/10/2023 1:15 PM Medical Record Number: [id number]  Patient Account Number: [id number] Date of Birth/Sex: Treating RN: [birth year] (83 y.o. M) Primary Care Ausencio Vaden: Linus Galas Other Clinician: Referring Maryellen Dowdle: Treating Martise Waddell/Extender: Boris Lown Weeks in Treatment: 10 Vital Signs Height(in): 64 Pulse(bpm): 54 Weight(lbs): 189.4 Blood Pressure(mmHg): Body Mass Index(BMI): 32.5 Temperature(F): 98.2 Respiratory Rate(breaths/min): 18 [1:Photos:] [N/A:N/A] Left, Distal, Lateral Lower Leg N/A N/A Wound Location: Bump N/A N/A Wounding Event: Venous Leg Ulcer N/A N/A Primary Etiology: Cataracts, Arrhythmia, Congestive N/A N/A Comorbid History: Heart Failure, Hypertension, Gout 06/13/2023 N/A N/A Date Acquired: 10 N/A N/A Weeks of Treatment: Open N/A N/A Wound Status: No N/A N/A Wound Recurrence: 4.1x1.6x0.1 N/A N/A Measurements L x W x D (cm) 5.152 N/A N/A A (cm) : rea 0.515 N/A N/A Volume (cm) : 94.00% N/A N/A % Reduction in Area: 98.50% N/A N/A % Reduction in Volume: Full Thickness Without Exposed N/A N/A Classification: Support Structures Medium N/A N/A Exudate Amount: Serosanguineous N/A  N/A Exudate Type: red, brown N/A N/A Exudate Color: Distinct, outline attached N/A N/A Wound Margin: Large (67-100%) N/A N/A Granulation Amount: Red N/A N/A Granulation Quality: Small (1-33%) N/A N/A Necrotic Amount: Fat Layer (Subcutaneous Tissue): Yes N/A N/A Exposed Structures: Fascia: No Tendon: No Muscle: No Joint: No Bone: No Medium (34-66%) N/A N/A Epithelialization: Scarring: Yes N/A N/A Periwound Skin Texture: Excoriation: No Induration: No Callus: No Crepitus: No Rash: No Maceration: No N/A N/A Periwound Skin Moisture: Dry/Scaly: No Hemosiderin Staining: Yes N/A N/A Periwound Skin Color: Atrophie Blanche: No Cyanosis: No Ecchymosis: No Erythema: No Mottled: No DHRUVA, FEARNLEY ([id number]) [id number] Page 4 of 8 Pallor: No Rubor: No No Abnormality N/A N/A Temperature: Compression Therapy N/A N/A Procedures Performed: Treatment Notes Wound #1 (Lower Leg) Wound Laterality: Left, Lateral, Distal Cleanser Soap and Water Discharge Instruction: May shower and wash wound with dial antibacterial soap and water prior to dressing change. Peri-Wound Care Topical Gentamicin Discharge Instruction: As directed by physician Mupirocin Ointment Discharge Instruction: Apply Mupirocin (Bactroban) as instructed Triamcinolone Discharge Instruction: Apply Triamcinolone as directed Primary Dressing Hydrofera Blue Ready Transfer Foam, 4x5 (in/in) Discharge Instruction: Apply to wound bed as instructed Secondary Dressing ABD Pad, 8x10 Discharge Instruction: Apply over primary dressing as directed. Woven Gauze Sponge, Non-Sterile 4x4 in Discharge Instruction: Apply over primary dressing as directed. Secured With Compression Wrap Urgo K2 Lite, (equivalent to a 3 layer) two layer compression system, regular Discharge Instruction: Apply Urgo K2 Lite as directed (alternative to 3 layer compression). ****add first layer of unna boot to upper  portion of lower leg. Compression Stockings Add-Ons Electronic Signature(s) Signed: 10/10/2023 3:23:55 PM By: Geralyn Corwin DO Entered By: Geralyn Corwin on 10/10/2023 13:51:01 -------------------------------------------------------------------------------- Multi-Disciplinary Care Plan Details Patient Name: Date of Service: [name] 10/10/2023 1:15 PM Medical Record Number: [id number] Patient Account Number: [id number] Date of Birth/Sex: Treating RN: 1941-01-13 (83 y.o. [name] Primary Care Zerick Prevette: Linus Galas Other Clinician: Referring Taray Normoyle: Treating Kiasia Chou/Extender: Boris Lown Weeks in Treatment: 10 Active Inactive Wound/Skin Impairment Nursing Diagnoses: Impaired tissue integrity WWILLIAM, BISCARDI ([id number]) [id number] Page 5 of 8 Goals: Patient/caregiver will verbalize understanding of skin care regimen Date Initiated: 07/28/2023 Target Resolution Date: 04/12/2024 Goal Status: Active Interventions: Assess ulceration(s) every visit Treatment Activities: Skin care regimen initiated : 07/28/2023 Notes: Electronic Signature(s) Signed: 10/10/2023 2:44:18 PM By: Shawn Stall RN, BSN Entered By: Shawn Stall on 10/10/2023 13:46:49 -------------------------------------------------------------------------------- Pain Assessment Details Patient Name: Date of Service: [name]. 10/10/2023 1:15 PM Medical Record Number: [id number] Patient Account Number: [id number] Date  of Birth/Sex: Treating RN: December 14, 1940 (82 y.o. M) Primary Care Jsoeph Podesta: Linus Galas Other Clinician: Referring Kasir Hallenbeck: Treating Shifa Brisbon/Extender: Boris Lown Weeks in Treatment: 10 Active Problems Location of Pain Severity and Description of Pain Patient Has Paino No Site Locations Pain Management and Medication Current Pain Management: Electronic Signature(s) Signed: 10/11/2023 4:04:36 PM By: Thayer Dallas Entered By: Thayer Dallas on 10/10/2023 13:13:05 [name] ([id number]) [phone].pdf Page 6 of 8 -------------------------------------------------------------------------------- Patient/Caregiver Education Details Patient Name: Date of Service: MEDARD, BERO 1/20/2025andnbsp1:15 PM Medical Record Number: [id number] Patient Account Number: [id number] Date of Birth/Gender: Treating RN: 06-08-1941 (83 y.o. [name] Primary Care Physician: Linus Galas Other Clinician: Referring Physician: Treating Physician/Extender: Chauncey Mann in Treatment: 10 Education Assessment Education Provided To: Patient and Caregiver Education Topics Provided Wound/Skin Impairment: Handouts: Caring for Your Ulcer Methods: Explain/Verbal Responses: Reinforcements needed Electronic Signature(s) Signed: 10/10/2023 2:44:18 PM By: Shawn Stall RN, BSN Entered By: Shawn Stall on 10/10/2023 13:47:02 -------------------------------------------------------------------------------- Wound Assessment Details Patient Name: Date of Service: ARVO, RENTER 10/10/2023 1:15 PM Medical Record Number: [id number] Patient Account Number: [id number] Date of Birth/Sex: Treating RN: September 29, 1940 (82 y.o. M) Primary Care Jaycob Mcclenton: Linus Galas Other Clinician: Referring Jartavious Mckimmy: Treating Merrel Crabbe/Extender: Boris Lown Weeks in Treatment: 10 Wound Status Wound Number: 1 Primary Venous Leg Ulcer Etiology: Wound Location: Left, Distal, Lateral Lower Leg Wound Status: Open Wounding Event: Bump Comorbid Cataracts, Arrhythmia, Congestive Heart Failure, Date Acquired: 06/13/2023 History: Hypertension, Gout Weeks Of Treatment: 10 Clustered Wound: No Photos Wound Measurements Length: (cm) 4.1 Width: (cm) 1.6 Depth: (cm) 0.1 Area: (cm) 5.152 Volume: (cm) 0.515 % Reduction in Area: 94% % Reduction in Volume: 98.5% Epithelialization:  Medium (34-66%) Tunneling: No Undermining: No Wound Description NEVYN, GARG ([id number]) Classification: Full Thickness Without Exposed Support Structures Wound Margin: Distinct, outline attached Exudate Amount: Medium Exudate Type: Serosanguineous Exudate Color: red, brown [phone].pdf Page 7 of 8 Foul Odor After Cleansing: No Slough/Fibrino Yes Wound Bed Granulation Amount: Large (67-100%) Exposed Structure Granulation Quality: Red Fascia Exposed: No Necrotic Amount: Small (1-33%) Fat Layer (Subcutaneous Tissue) Exposed: Yes Necrotic Quality: Adherent Slough Tendon Exposed: No Muscle Exposed: No Joint Exposed: No Bone Exposed: No Periwound Skin Texture Texture Color No Abnormalities Noted: No No Abnormalities Noted: No Callus: No Atrophie Blanche: No Crepitus: No Cyanosis: No Excoriation: No Ecchymosis: No Induration: No Erythema: No Rash: No Hemosiderin Staining: Yes Scarring: Yes Mottled: No Pallor: No Moisture Rubor: No No Abnormalities Noted: Yes Temperature / Pain Temperature: No Abnormality Treatment Notes Wound #1 (Lower Leg) Wound Laterality: Left, Lateral, Distal Cleanser Soap and Water Discharge Instruction: May shower and wash wound with dial antibacterial soap and water prior to dressing change. Peri-Wound Care Topical Gentamicin Discharge Instruction: As directed by physician Mupirocin Ointment Discharge Instruction: Apply Mupirocin (Bactroban) as instructed Triamcinolone Discharge Instruction: Apply Triamcinolone as directed Primary Dressing Hydrofera Blue Ready Transfer Foam, 4x5 (in/in) Discharge Instruction: Apply to wound bed as instructed Secondary Dressing ABD Pad, 8x10 Discharge Instruction: Apply over primary dressing as directed. Woven Gauze Sponge, Non-Sterile 4x4 in Discharge Instruction: Apply over primary dressing as directed. Secured With Compression Wrap Urgo K2 Lite, (equivalent to a 3  layer) two layer compression system, regular Discharge Instruction: Apply Urgo K2 Lite as directed (alternative to 3 layer compression). ****add first layer of unna boot to upper portion of lower leg. Compression Stockings Add-Ons Electronic Signature(s) Signed: 10/11/2023 4:04:36 PM By: Thayer Dallas Entered By: Thayer Dallas on 10/10/2023 13:24:38 Remi Haggard  W ([id number]) [id number] Page 8 of 8 -------------------------------------------------------------------------------- Vitals Details Patient Name: Date of Service: TARAN, LOVEDAY 10/10/2023 1:15 PM Medical Record Number: [id number] Patient Account Number: [id number] Date of Birth/Sex: Treating RN: 07-14-1941 (83 y.o. M) Primary Care Geary Rufo: Linus Galas Other Clinician: Referring Rylan Bernard: Treating Caryssa Elzey/Extender: Chauncey Mann in Treatment: 10 Vital Signs Time Taken: 13:23 Temperature (F): 98.2 Height (in): 64 Pulse (bpm): 54 Weight (lbs): 189.4 Respiratory Rate (breaths/min): 18 Body Mass Index (BMI): 32.5 Reference Range: 80 - 120 mg / dl Electronic Signature(s) Signed: 10/11/2023 4:04:36 PM By: Thayer Dallas Entered By: Thayer Dallas on 10/10/2023 13:23:23

## 2023-10-18 ENCOUNTER — Encounter (HOSPITAL_BASED_OUTPATIENT_CLINIC_OR_DEPARTMENT_OTHER): Payer: Medicare Other | Admitting: Internal Medicine

## 2023-10-18 DIAGNOSIS — L97828 Non-pressure chronic ulcer of other part of left lower leg with other specified severity: Secondary | ICD-10-CM | POA: Diagnosis not present

## 2023-10-18 DIAGNOSIS — I87332 Chronic venous hypertension (idiopathic) with ulcer and inflammation of left lower extremity: Secondary | ICD-10-CM

## 2023-10-18 DIAGNOSIS — E1151 Type 2 diabetes mellitus with diabetic peripheral angiopathy without gangrene: Secondary | ICD-10-CM | POA: Diagnosis not present

## 2023-10-25 ENCOUNTER — Encounter (HOSPITAL_BASED_OUTPATIENT_CLINIC_OR_DEPARTMENT_OTHER): Payer: Medicare Other | Attending: Internal Medicine | Admitting: Internal Medicine

## 2023-10-25 DIAGNOSIS — I87332 Chronic venous hypertension (idiopathic) with ulcer and inflammation of left lower extremity: Secondary | ICD-10-CM

## 2023-10-25 DIAGNOSIS — S81801A Unspecified open wound, right lower leg, initial encounter: Secondary | ICD-10-CM | POA: Diagnosis not present

## 2023-10-25 DIAGNOSIS — L97828 Non-pressure chronic ulcer of other part of left lower leg with other specified severity: Secondary | ICD-10-CM | POA: Diagnosis not present

## 2023-10-25 DIAGNOSIS — X58XXXA Exposure to other specified factors, initial encounter: Secondary | ICD-10-CM | POA: Insufficient documentation

## 2023-10-25 DIAGNOSIS — E11621 Type 2 diabetes mellitus with foot ulcer: Secondary | ICD-10-CM | POA: Diagnosis not present

## 2023-10-25 DIAGNOSIS — I87311 Chronic venous hypertension (idiopathic) with ulcer of right lower extremity: Secondary | ICD-10-CM

## 2023-10-31 ENCOUNTER — Encounter (HOSPITAL_BASED_OUTPATIENT_CLINIC_OR_DEPARTMENT_OTHER): Payer: Medicare Other | Admitting: Internal Medicine

## 2023-10-31 ENCOUNTER — Ambulatory Visit (INDEPENDENT_AMBULATORY_CARE_PROVIDER_SITE_OTHER): Payer: Medicare Other | Admitting: Physician Assistant

## 2023-10-31 VITALS — BP 195/80 | HR 52 | Temp 97.5°F | Ht 64.0 in | Wt 193.2 lb

## 2023-10-31 DIAGNOSIS — L97828 Non-pressure chronic ulcer of other part of left lower leg with other specified severity: Secondary | ICD-10-CM | POA: Diagnosis not present

## 2023-10-31 DIAGNOSIS — S81802A Unspecified open wound, left lower leg, initial encounter: Secondary | ICD-10-CM | POA: Diagnosis not present

## 2023-10-31 DIAGNOSIS — I87332 Chronic venous hypertension (idiopathic) with ulcer and inflammation of left lower extremity: Secondary | ICD-10-CM | POA: Diagnosis not present

## 2023-10-31 DIAGNOSIS — S81801A Unspecified open wound, right lower leg, initial encounter: Secondary | ICD-10-CM | POA: Diagnosis not present

## 2023-10-31 DIAGNOSIS — S91301A Unspecified open wound, right foot, initial encounter: Secondary | ICD-10-CM

## 2023-10-31 DIAGNOSIS — I87311 Chronic venous hypertension (idiopathic) with ulcer of right lower extremity: Secondary | ICD-10-CM

## 2023-10-31 NOTE — Progress Notes (Signed)
  POST OPERATIVE OFFICE NOTE    CC:  F/u for surgery  HPI:  This is a 83 y.o. male who is here for follow up wound check.  He was last seen on 09/05/23.  He had non invasive arterial studies that showed triphasic and biphasic doppler signals with toe pressure of 95. Palpable pedal pulse on examination. Based on these findings we suspected that he had adequate perfusion enough to heel his wound with diligent wound care.   He is currently under management at the wound care center. He also is getting HH RN 1x/week come out to clean and change the dressing.  The wound is getting better. He denies any pain. He is walking with a Darco shoe. They are going back to the wound center today.   No Known Allergies  Current Outpatient Medications  Medication Sig Dispense Refill   acetaminophen  (TYLENOL ) 500 MG tablet Take 500 mg by mouth once as needed for moderate pain (pain score 4-6).     colchicine  0.6 MG tablet Take 1 tablet (0.6 mg total) by mouth daily. 10 tablet 0   collagenase  (SANTYL ) 250 UNIT/GM ointment Apply topically daily. 15 g 0   furosemide  (LASIX ) 40 MG tablet 1 tablet Orally Once a day for 30 days (Patient not taking: Reported on 10/31/2023)     metoprolol  succinate (TOPROL -XL) 100 MG 24 hr tablet Take 1 tablet by mouth once daily 90 tablet 2   metoprolol  succinate (TOPROL -XL) 25 MG 24 hr tablet TAKE 1 TABLET BY MOUTH ONCE DAILY WITH  OR  IMMEDIATELY  FOLLOWING  A  MEAL 90 tablet 3   pantoprazole  (PROTONIX ) 40 MG tablet Take 1 tablet (40 mg total) by mouth daily. 30 tablet 0   potassium chloride  (KLOR-CON ) 10 MEQ tablet Take 10 mEq by mouth daily.     triamcinolone cream (KENALOG) 0.1 % Apply 1 Application topically daily.     Turmeric (QC TUMERIC COMPLEX) 500 MG CAPS Take 1 capsule by mouth daily.     No current facility-administered medications for this visit.     ROS:  See HPI  Physical Exam:       Palpable pedal pulses Good beefy red base with 50% smaller wound. Left  GT toe healed from nail evulsion Ambulating without assistive devices No edema     Assessment/Plan:  This is a 83 y.o. male who was seen for non healing wounds.  He has palpable pedal pulses B and the wounds are healing well.  His ABI's are essentially normal,    -Continue wound care until they have healed.  If he develops new symptoms of foot non healing wounds, tissue loss that indicates ischemia he or his wife will call our office.  Dry dressing was applied over the wounds until he could drive over to the wound care center. No vascular intervention is indicated f/u PRN.  His wife agrees and will call us  with future concerns.    Rocky Cipro PA-C [facility] [phone]   Clinic MD:  Charlotte Cookey

## 2023-11-08 ENCOUNTER — Encounter (HOSPITAL_BASED_OUTPATIENT_CLINIC_OR_DEPARTMENT_OTHER): Payer: Medicare Other | Admitting: Internal Medicine

## 2023-11-08 DIAGNOSIS — S91301A Unspecified open wound, right foot, initial encounter: Secondary | ICD-10-CM

## 2023-11-08 DIAGNOSIS — S81801A Unspecified open wound, right lower leg, initial encounter: Secondary | ICD-10-CM

## 2023-11-08 DIAGNOSIS — I87332 Chronic venous hypertension (idiopathic) with ulcer and inflammation of left lower extremity: Secondary | ICD-10-CM | POA: Diagnosis not present

## 2023-11-08 DIAGNOSIS — E11621 Type 2 diabetes mellitus with foot ulcer: Secondary | ICD-10-CM | POA: Diagnosis not present

## 2023-11-08 DIAGNOSIS — I87311 Chronic venous hypertension (idiopathic) with ulcer of right lower extremity: Secondary | ICD-10-CM

## 2023-11-08 DIAGNOSIS — L97828 Non-pressure chronic ulcer of other part of left lower leg with other specified severity: Secondary | ICD-10-CM | POA: Diagnosis not present

## 2023-11-15 ENCOUNTER — Encounter (HOSPITAL_BASED_OUTPATIENT_CLINIC_OR_DEPARTMENT_OTHER): Payer: Medicare Other | Admitting: Internal Medicine

## 2023-11-15 DIAGNOSIS — S81801A Unspecified open wound, right lower leg, initial encounter: Secondary | ICD-10-CM | POA: Diagnosis not present

## 2023-11-15 DIAGNOSIS — I87311 Chronic venous hypertension (idiopathic) with ulcer of right lower extremity: Secondary | ICD-10-CM

## 2023-11-15 DIAGNOSIS — I87332 Chronic venous hypertension (idiopathic) with ulcer and inflammation of left lower extremity: Secondary | ICD-10-CM | POA: Diagnosis not present

## 2023-11-15 DIAGNOSIS — L97828 Non-pressure chronic ulcer of other part of left lower leg with other specified severity: Secondary | ICD-10-CM

## 2023-11-22 ENCOUNTER — Encounter (HOSPITAL_BASED_OUTPATIENT_CLINIC_OR_DEPARTMENT_OTHER): Payer: Medicare Other | Admitting: Internal Medicine

## 2024-02-03 ENCOUNTER — Other Ambulatory Visit: Payer: Self-pay

## 2024-02-03 ENCOUNTER — Emergency Department (HOSPITAL_COMMUNITY)

## 2024-02-03 ENCOUNTER — Emergency Department (HOSPITAL_COMMUNITY)
Admission: EM | Admit: 2024-02-03 | Discharge: 2024-02-19 | Disposition: E | Attending: Emergency Medicine | Admitting: Emergency Medicine

## 2024-02-03 DIAGNOSIS — A419 Sepsis, unspecified organism: Secondary | ICD-10-CM | POA: Diagnosis not present

## 2024-02-03 DIAGNOSIS — R7989 Other specified abnormal findings of blood chemistry: Secondary | ICD-10-CM | POA: Diagnosis not present

## 2024-02-03 DIAGNOSIS — R600 Localized edema: Secondary | ICD-10-CM | POA: Insufficient documentation

## 2024-02-03 DIAGNOSIS — R791 Abnormal coagulation profile: Secondary | ICD-10-CM | POA: Insufficient documentation

## 2024-02-03 DIAGNOSIS — N189 Chronic kidney disease, unspecified: Secondary | ICD-10-CM | POA: Diagnosis not present

## 2024-02-03 DIAGNOSIS — R109 Unspecified abdominal pain: Secondary | ICD-10-CM

## 2024-02-03 DIAGNOSIS — I509 Heart failure, unspecified: Secondary | ICD-10-CM | POA: Diagnosis not present

## 2024-02-03 DIAGNOSIS — R945 Abnormal results of liver function studies: Secondary | ICD-10-CM | POA: Insufficient documentation

## 2024-02-03 DIAGNOSIS — R1084 Generalized abdominal pain: Secondary | ICD-10-CM | POA: Diagnosis present

## 2024-02-03 LAB — I-STAT CG4 LACTIC ACID, ED
Lactic Acid, Venous: 5.1 mmol/L (ref 0.5–1.9)
Lactic Acid, Venous: 5.8 mmol/L (ref 0.5–1.9)

## 2024-02-03 LAB — COMPREHENSIVE METABOLIC PANEL WITH GFR
ALT: 1306 U/L — ABNORMAL HIGH (ref 0–44)
AST: 2470 U/L — ABNORMAL HIGH (ref 15–41)
Albumin: 3.8 g/dL (ref 3.5–5.0)
Alkaline Phosphatase: 78 U/L (ref 38–126)
Anion gap: 16 — ABNORMAL HIGH (ref 5–15)
BUN: 46 mg/dL — ABNORMAL HIGH (ref 8–23)
CO2: 17 mmol/L — ABNORMAL LOW (ref 22–32)
Calcium: 9.3 mg/dL (ref 8.9–10.3)
Chloride: 107 mmol/L (ref 98–111)
Creatinine, Ser: 2.22 mg/dL — ABNORMAL HIGH (ref 0.61–1.24)
GFR, Estimated: 29 mL/min — ABNORMAL LOW (ref >60)
Glucose, Bld: 119 mg/dL — ABNORMAL HIGH (ref 70–99)
Potassium: 4.9 mmol/L (ref 3.5–5.1)
Sodium: 140 mmol/L (ref 135–145)
Total Bilirubin: 3.4 mg/dL — ABNORMAL HIGH (ref 0.0–1.2)
Total Protein: 7.3 g/dL (ref 6.5–8.1)

## 2024-02-03 LAB — TROPONIN I (HIGH SENSITIVITY): Troponin I (High Sensitivity): >24000 ng/L (ref <18)

## 2024-02-03 LAB — URINALYSIS, W/ REFLEX TO CULTURE (INFECTION SUSPECTED)
Bilirubin Urine: NEGATIVE
Glucose, UA: NEGATIVE mg/dL
Ketones, ur: NEGATIVE mg/dL
Leukocytes,Ua: NEGATIVE
Nitrite: NEGATIVE
Protein, ur: 100 mg/dL — AB
Specific Gravity, Urine: 1.018 (ref 1.005–1.030)
pH: 5 (ref 5.0–8.0)

## 2024-02-03 LAB — CBC WITH DIFFERENTIAL/PLATELET
Abs Immature Granulocytes: 0.15 10*3/uL — ABNORMAL HIGH (ref 0.00–0.07)
Basophils Absolute: 0 10*3/uL (ref 0.0–0.1)
Basophils Relative: 0 %
Eosinophils Absolute: 0 10*3/uL (ref 0.0–0.5)
Eosinophils Relative: 0 %
HCT: 45.8 % (ref 39.0–52.0)
Hemoglobin: 14.6 g/dL (ref 13.0–17.0)
Immature Granulocytes: 1 %
Lymphocytes Relative: 4 %
Lymphs Abs: 0.6 10*3/uL — ABNORMAL LOW (ref 0.7–4.0)
MCH: 33 pg (ref 26.0–34.0)
MCHC: 31.9 g/dL (ref 30.0–36.0)
MCV: 103.6 fL — ABNORMAL HIGH (ref 80.0–100.0)
Monocytes Absolute: 1.1 10*3/uL — ABNORMAL HIGH (ref 0.1–1.0)
Monocytes Relative: 8 %
Neutro Abs: 12.7 10*3/uL — ABNORMAL HIGH (ref 1.7–7.7)
Neutrophils Relative %: 87 %
Platelets: 293 10*3/uL (ref 150–400)
RBC: 4.42 MIL/uL (ref 4.22–5.81)
RDW: 14.4 % (ref 11.5–15.5)
WBC: 14.5 10*3/uL — ABNORMAL HIGH (ref 4.0–10.5)
nRBC: 0 % (ref 0.0–0.2)

## 2024-02-03 LAB — RESP PANEL BY RT-PCR (RSV, FLU A&B, COVID)  RVPGX2
Influenza A by PCR: NEGATIVE
Influenza B by PCR: NEGATIVE
Resp Syncytial Virus by PCR: NEGATIVE
SARS Coronavirus 2 by RT PCR: NEGATIVE

## 2024-02-03 LAB — BRAIN NATRIURETIC PEPTIDE: B Natriuretic Peptide: >4500 pg/mL — ABNORMAL HIGH (ref 0.0–100.0)

## 2024-02-03 LAB — PROTIME-INR
INR: 1.9 — ABNORMAL HIGH (ref 0.8–1.2)
Prothrombin Time: 22.2 s — ABNORMAL HIGH (ref 11.4–15.2)

## 2024-02-03 MED ORDER — SODIUM CHLORIDE 0.9 % IV BOLUS: 1000.0000 mL | Freq: Once | INTRAVENOUS | Status: DC

## 2024-02-03 MED ORDER — LACTATED RINGERS IV SOLN: INTRAVENOUS | Status: DC

## 2024-02-03 MED ORDER — LACTATED RINGERS IV BOLUS (SEPSIS): 1000.0000 mL | Freq: Once | INTRAVENOUS | Status: DC

## 2024-02-03 MED ORDER — METRONIDAZOLE 500 MG/100ML IV SOLN
500.0000 mg | Freq: Once | INTRAVENOUS | Status: AC
  Administered 2024-02-03: 500 mg via INTRAVENOUS
  Filled 2024-02-03: qty 100

## 2024-02-03 MED ORDER — VANCOMYCIN HCL IN DEXTROSE 1-5 GM/200ML-% IV SOLN: 1000.0000 mg | Freq: Once | INTRAVENOUS | Status: DC

## 2024-02-03 MED ORDER — LACTATED RINGERS IV BOLUS (SEPSIS)
1000.0000 mL | Freq: Once | INTRAVENOUS | Status: AC
  Administered 2024-02-03: 1 mL via INTRAVENOUS

## 2024-02-03 MED ORDER — IOHEXOL 350 MG/ML SOLN
100.0000 mL | Freq: Once | INTRAVENOUS | Status: AC | PRN
  Administered 2024-02-03: 100 mL via INTRAVENOUS

## 2024-02-03 MED ORDER — VANCOMYCIN HCL 1500 MG/300ML IV SOLN
1500.0000 mg | Freq: Once | INTRAVENOUS | Status: AC
  Administered 2024-02-03: 1500 mg via INTRAVENOUS
  Filled 2024-02-03: qty 300

## 2024-02-03 MED ORDER — SODIUM CHLORIDE 0.9 % IV SOLN
2.0000 g | Freq: Once | INTRAVENOUS | Status: AC
  Administered 2024-02-03: 2 g via INTRAVENOUS
  Filled 2024-02-03: qty 12.5

## 2024-02-08 LAB — CULTURE, BLOOD (ROUTINE X 2)
Culture: NO GROWTH
Culture: NO GROWTH
Special Requests: ADEQUATE

## 2024-02-19 NOTE — ED Triage Notes (Signed)
 Pt BIB [facility]  from home that started 2 days ago. Patient wife reported  the PCP started him on antibiotic for respiratory infection, but she quit giving him the antibiotic early he wad responding different and was fatigue. He c/o of Belly pain while on the scene. Patient have history of Afib HR 113. EMS report patient respiration was shallow, weak  palpated, RR 20 to 25, and initial  saturation was 93.

## 2024-02-19 NOTE — ED Notes (Signed)
 Lab reported trop of greater than 24,000

## 2024-02-19 NOTE — ED Notes (Signed)
 Pt placed in body bag, tagged, and transported to morgue by this RN and Swannie NT. Pt received by security and [employer] [facility] at arrival to morgue.

## 2024-02-19 NOTE — ED Provider Notes (Signed)
 Patient handed off to me critically ill.  Lactic of 5.1.  EKG with ischemic changes but cardiology did not want to activate a STEMI.  Overall had poor perfusion but we are able to Doppler pulses on my exam of his DP bilaterally after fluid resuscitation.  He is getting a CT chest abdomen pelvis to evaluate.  He has a BNP greater than 4500 he is got liver enzymes that are elevated.  Troponins pending.  Overall I suspect the cardiac event which shock liver and cardiogenic shock.  Patient is a DNR DNI does not want CPR.  Patient obtained CT scans which are unremarkable per radiology report.  Seems like it was a poor contrast study but overall we did have Doppler pulses but otherwise were in any acute findings on CT.  Troponin was greater than 24,000 and lactic acid on repeat was 5.8.  Shortly after patient came back from CT he went pulseless.  He was confirmed to cease at 1701 by myself.  Bedside ultrasound showed no cardiac activity.  Family was at the bedside to confirm DNR/DNI no CPR was continued after we confirmed this.  I talked with the medical examiner Natosha Bullins, will not be an ME case.  Seems like this was natural causes.  Death certificate to be sent to primary Dr. Yvonnie Heritage, NP.  Overall I do think that this was cardiogenic shock from recent MI.  Family was notified at the bedside.  This chart was dictated using voice recognition software.  Despite best efforts to proofread,  errors can occur which can change the documentation meaning.   .Critical Care  Performed by: Lowery Rue, DO Authorized by: Lowery Rue, DO   Critical care provider statement:    Critical care time (minutes):  45   Critical care was necessary to treat or prevent imminent or life-threatening deterioration of the following conditions:  Cardiac failure   Critical care was time spent personally by me on the following activities:  Blood draw for specimens, development of treatment plan with patient or surrogate,  evaluation of patient's response to treatment, examination of patient, obtaining history from patient or surrogate, ordering and performing treatments and interventions, ordering and review of laboratory studies, ordering and review of radiographic studies, pulse oximetry, re-evaluation of patient's condition and review of old charts   Care discussed with: admitting provider       Lowery Rue, DO 01/26/2024 1842

## 2024-02-19 NOTE — Progress Notes (Signed)
 Elink following for sepsis protocol.

## 2024-02-19 NOTE — ED Provider Notes (Signed)
 [name] AT [facility] Provider Note   CSN: [id number] Arrival date & time: 02/08/2024  1236     History  Chief Complaint  Patient presents with   sepsis    Frank W Amrit Nevells. is a 83 y.o. male.  The history is provided by the patient, medical records, a relative and the spouse. No language interpreter was used.  Abdominal Pain Pain location:  Generalized Pain quality: aching   Pain radiates to:  Does not radiate Pain severity:  Mild Timing:  Unable to specify Progression:  Unable to specify Relieved by:  Nothing Worsened by:  Palpation Associated symptoms: constipation, diarrhea and fatigue   Associated symptoms: no chest pain, no chills, no cough, no dysuria, no fever, no nausea, no shortness of breath and no vomiting        Home Medications Prior to Admission medications   Medication Sig Start Date End Date Taking? Authorizing Provider  acetaminophen  (TYLENOL ) 500 MG tablet Take 500 mg by mouth once as needed for moderate pain (pain score 4-6).    [provider]  colchicine  0.6 MG tablet Take 1 tablet (0.6 mg total) by mouth daily. 03/03/20   Singh, Prashant K, MD  collagenase  (SANTYL ) 250 UNIT/GM ointment Apply topically daily. 07/24/23   Hoyt Macleod, MD  furosemide  (LASIX ) 40 MG tablet 1 tablet Orally Once a day for 30 days Patient not taking: Reported on 10/31/2023 08/26/23   [provider]  metoprolol  succinate (TOPROL -XL) 100 MG 24 hr tablet Take 1 tablet by mouth once daily 07/05/23   Jacqueline Matsu, MD  metoprolol  succinate (TOPROL -XL) 25 MG 24 hr tablet TAKE 1 TABLET BY MOUTH ONCE DAILY WITH  OR  IMMEDIATELY  FOLLOWING  A  MEAL 08/08/23   Conte, Tessa N, PA-C  pantoprazole  (PROTONIX ) 40 MG tablet Take 1 tablet (40 mg total) by mouth daily. 03/03/20   Singh, Prashant K, MD  potassium chloride  (KLOR-CON ) 10 MEQ tablet Take 10 mEq by mouth daily. 07/10/23   [provider]  triamcinolone cream (KENALOG)  0.1 % Apply 1 Application topically daily. 07/14/23   [provider]  Turmeric (QC TUMERIC COMPLEX) 500 MG CAPS Take 1 capsule by mouth daily.    [provider]      Allergies    Patient has no known allergies.    Review of Systems   Review of Systems  Constitutional:  Positive for fatigue. Negative for chills, diaphoresis and fever.  HENT:  Negative for congestion.   Respiratory:  Negative for cough, chest tightness and shortness of breath.   Cardiovascular:  Negative for chest pain.  Gastrointestinal:  Positive for abdominal pain, constipation and diarrhea. Negative for nausea and vomiting.  Genitourinary:  Negative for dysuria and frequency.  Musculoskeletal:  Negative for back pain.  Skin:  Positive for color change and wound.  Neurological:  Negative for headaches.  Psychiatric/Behavioral:  Negative for agitation.   All other systems reviewed and are negative.   Physical Exam Updated Vital Signs There were no vitals taken for this visit. Physical Exam Vitals and nursing note reviewed.  Constitutional:      General: He is in acute distress.     Appearance: He is ill-appearing. He is not diaphoretic.  HENT:     Head: Normocephalic.     Mouth/Throat:     Mouth: Mucous membranes are dry.     Pharynx: No oropharyngeal exudate or posterior oropharyngeal erythema.  Eyes:     Conjunctiva/sclera:  Conjunctivae normal.     Pupils: Pupils are equal, round, and reactive to light.  Cardiovascular:     Rate and Rhythm: Normal rate.     Heart sounds: No murmur heard. Pulmonary:     Breath sounds: Rhonchi present. No wheezing or rales.  Chest:     Chest wall: No tenderness.  Abdominal:     Tenderness: There is abdominal tenderness.  Musculoskeletal:        General: No tenderness.     Right lower leg: Edema present.     Left lower leg: Edema present.  Skin:    Capillary Refill: Capillary refill takes more than 3 seconds.     Findings: No erythema.   Neurological:     General: No focal deficit present.     Mental Status: He is alert.     ED Results / Procedures / Treatments   Labs (all labs ordered are listed, but only abnormal results are displayed) Labs Reviewed  COMPREHENSIVE METABOLIC PANEL WITH GFR - Abnormal; Notable for the following components:      Result Value   CO2 17 (*)    Glucose, Bld 119 (*)    BUN 46 (*)    Creatinine, Ser 2.22 (*)    AST 2,470 (*)    ALT 1,306 (*)    Total Bilirubin 3.4 (*)    GFR, Estimated 29 (*)    Anion gap 16 (*)    All other components within normal limits  CBC WITH DIFFERENTIAL/PLATELET - Abnormal; Notable for the following components:   WBC 14.5 (*)    MCV 103.6 (*)    Neutro Abs 12.7 (*)    Lymphs Abs 0.6 (*)    Monocytes Absolute 1.1 (*)    Abs Immature Granulocytes 0.15 (*)    All other components within normal limits  PROTIME-INR - Abnormal; Notable for the following components:   Prothrombin Time 22.2 (*)    INR 1.9 (*)    All other components within normal limits  BRAIN NATRIURETIC PEPTIDE - Abnormal; Notable for the following components:   B Natriuretic Peptide >4,500.0 (*)    All other components within normal limits  URINALYSIS, W/ REFLEX TO CULTURE (INFECTION SUSPECTED) - Abnormal; Notable for the following components:   APPearance HAZY (*)    Hgb urine dipstick MODERATE (*)    Protein, ur 100 (*)    Bacteria, UA RARE (*)    All other components within normal limits  I-STAT CG4 LACTIC ACID, ED - Abnormal; Notable for the following components:   Lactic Acid, Venous 5.1 (*)    All other components within normal limits  I-STAT CG4 LACTIC ACID, ED - Abnormal; Notable for the following components:   Lactic Acid, Venous 5.8 (*)    All other components within normal limits  RESP PANEL BY RT-PCR (RSV, FLU A&B, COVID)  [id number]  CULTURE, BLOOD (ROUTINE X 2)  CULTURE, BLOOD (ROUTINE X 2)  TROPONIN I (HIGH SENSITIVITY)  TROPONIN I (HIGH SENSITIVITY)    EKG EKG  Interpretation Date/Time:  Friday Feb 03 2024 13:03:21 EDT Ventricular Rate:  110 PR Interval:  177 QRS Duration:  103 QT Interval:  370 QTC Calculation: 501 R Axis:   94  Text Interpretation: aflutter Left posterior fascicular block Probable inferior infarct, recent Lateral leads are also involved Prolonged QT interval when compared to prior, looks like aflutter No STEMI per Cards Confirmed by Wynell Heath ([id number]) on 01/31/2024 2:06:54 PM  Radiology No results found.  Procedures Procedures  CRITICAL CARE Performed by: Marine Sia Talan Gildner Total critical care time: 45 minutes Critical care time was exclusive of separately billable procedures and treating other patients. Critical care was necessary to treat or prevent imminent or life-threatening deterioration. Critical care was time spent personally by me on the following activities: development of treatment plan with patient and/or surrogate as well as nursing, discussions with consultants, evaluation of patient's response to treatment, examination of patient, obtaining history from patient or surrogate, ordering and performing treatments and interventions, ordering and review of laboratory studies, ordering and review of radiographic studies, pulse oximetry and re-evaluation of patient's condition.  Medications Ordered in ED Medications - No data to display  ED Course/ Medical Decision Making/ A&P                                 Medical Decision Making Amount and/or Complexity of Data Reviewed Labs: ordered. Radiology: ordered.  Risk Prescription drug management.    Remer W Avraham Stadtler. is a 83 y.o. male with past medical history significant for atrial fibrillation, CKD, hyperlipidemia, carotid disease, CHF, leg wounds, and recent URI who presents for concerning sepsis.  According to EMS, patient has had symptoms for the last 2 days and recently was briefly started on antibiotics but did not complete for URI last week.   Family reported to EMS that they stopped giving him antibiotics because he started responding differently and was more tired.  On arrival, patient is very hard of hearing and is still answering questions.  EMS said that their best blood pressure was 72 systolic and then they could not feel good radial pulses.  Patient is very mottled and has very purple appearing legs.  He has wounds on both of his toes.  Patient does not know if this is normal for him.  Patient is reporting some cough but denies any chest pain or shortness of breath.  He initially denied abdominal pain constipation, diarrhea, or urinary changes.  EMS interjected that the patient has been having loose stools.  Patient moving all extremities and does appear to have intact sensation.  On my exam, lungs have some coarseness and chest is nontender.  Abdomen was diffusely tender but I heard bowel sounds.  He did not have distal pulses by palpation but with a Doppler he did have left DP and PT pulse.  Right side I could not find a DP or PT pulse.  He has delayed cap refill in both feet and has wounds on his toes.  There is no crepitance appreciated and he was not have any tenderness in his feet or legs.  Otherwise pupils are symmetric and reactive with normal extract movements.  Patient is answering some questions.  He is repeating that he is very tired.  Clinically I am concerned about sepsis given the patient's hypotension, tachycardia, tachypnea, and he is warm to the touch.  Temperature was nine 9.4, will activate code sepsis.  With his abdominal tenderness and bilateral leg abnormal pulses with delayed capillary fill and purple appearance I am concerned about possible thromboembolic disease given his history of A-fib and I do not see that he is on blood thinners.  Will order a CTA runoff of the abdomen and legs and will get chest x-ray for the code sepsis.  Will give him antibiotics and fluids.   Anticipate admission after workup is  completed.      Initial lactic acid is 5.1  and his initial white count is 14.5 concerning for infection.  Hemoglobin is normal.  He will need admission after workup.      1:43 PM EKG showed some ST changes compared to prior.  I am concerned this looks more like atrial fibrillation/flutter that is making some ST elevations arise with a buried P waves.  I spoke to Dr. Theodis Fiscal with cardiology who reviewed the EKG and she also agrees this looks like a 2-1 flutter pattern.  She does not see STEMI.  Will continue management for suspected sepsis at this time.  3:26 PM Patient needs CT scan with contrast.  His GFR is 29 however I am very concerned about aortic pathology or clot.  His liver function is also concerning for possible shock liver.  Will get the CT of the chest/abdomen/pelvis with runoff to look for concerning findings.  Care transferred oncoming team to wait for workup results and he will need admission.         Final Clinical Impression(s) / ED Diagnoses Final diagnoses:  Abdominal pain, unspecified abdominal location     Clinical Impression: 1. Abdominal pain, unspecified abdominal location     Disposition: Admit  This note was prepared with assistance of Dragon voice recognition software. Occasional wrong-word or sound-a-like substitutions may have occurred due to the inherent limitations of voice recognition software.     Riddick Nuon, Marine Sia, MD 02/06/2024 [phone]

## 2024-02-19 DEATH — deceased
